# Patient Record
Sex: Male | Born: 1945 | Race: White | Hispanic: No | Smoking: Current every day smoker
Health system: Southern US, Community
[De-identification: ages and names within clinical notes are randomized; demographics above are authoritative.]

## PROBLEM LIST (undated history)

## (undated) DIAGNOSIS — Q211 Atrial septal defect, unspecified: Secondary | ICD-10-CM

## (undated) DIAGNOSIS — Z87442 Personal history of urinary calculi: Secondary | ICD-10-CM

## (undated) DIAGNOSIS — J189 Pneumonia, unspecified organism: Secondary | ICD-10-CM

## (undated) DIAGNOSIS — I499 Cardiac arrhythmia, unspecified: Secondary | ICD-10-CM

## (undated) DIAGNOSIS — J302 Other seasonal allergic rhinitis: Secondary | ICD-10-CM

## (undated) DIAGNOSIS — E0590 Thyrotoxicosis, unspecified without thyrotoxic crisis or storm: Secondary | ICD-10-CM

## (undated) DIAGNOSIS — Z7901 Long term (current) use of anticoagulants: Secondary | ICD-10-CM

## (undated) DIAGNOSIS — R7301 Impaired fasting glucose: Secondary | ICD-10-CM

## (undated) DIAGNOSIS — R6889 Other general symptoms and signs: Secondary | ICD-10-CM

## (undated) DIAGNOSIS — F329 Major depressive disorder, single episode, unspecified: Secondary | ICD-10-CM

## (undated) DIAGNOSIS — Z8489 Family history of other specified conditions: Secondary | ICD-10-CM

## (undated) DIAGNOSIS — M6530 Trigger finger, unspecified finger: Secondary | ICD-10-CM

## (undated) DIAGNOSIS — I4892 Unspecified atrial flutter: Secondary | ICD-10-CM

## (undated) DIAGNOSIS — G4730 Sleep apnea, unspecified: Secondary | ICD-10-CM

## (undated) DIAGNOSIS — J45902 Unspecified asthma with status asthmaticus: Secondary | ICD-10-CM

## (undated) DIAGNOSIS — C801 Malignant (primary) neoplasm, unspecified: Secondary | ICD-10-CM

## (undated) DIAGNOSIS — E7800 Pure hypercholesterolemia, unspecified: Secondary | ICD-10-CM

## (undated) DIAGNOSIS — N529 Male erectile dysfunction, unspecified: Secondary | ICD-10-CM

## (undated) DIAGNOSIS — K760 Fatty (change of) liver, not elsewhere classified: Secondary | ICD-10-CM

## (undated) DIAGNOSIS — M109 Gout, unspecified: Secondary | ICD-10-CM

## (undated) DIAGNOSIS — E785 Hyperlipidemia, unspecified: Secondary | ICD-10-CM

## (undated) DIAGNOSIS — K219 Gastro-esophageal reflux disease without esophagitis: Secondary | ICD-10-CM

## (undated) DIAGNOSIS — I739 Peripheral vascular disease, unspecified: Secondary | ICD-10-CM

## (undated) DIAGNOSIS — IMO0002 Reserved for concepts with insufficient information to code with codable children: Secondary | ICD-10-CM

## (undated) DIAGNOSIS — I4891 Unspecified atrial fibrillation: Secondary | ICD-10-CM

## (undated) DIAGNOSIS — C61 Malignant neoplasm of prostate: Secondary | ICD-10-CM

## (undated) DIAGNOSIS — E291 Testicular hypofunction: Secondary | ICD-10-CM

## (undated) DIAGNOSIS — M1990 Unspecified osteoarthritis, unspecified site: Secondary | ICD-10-CM

## (undated) DIAGNOSIS — I10 Essential (primary) hypertension: Secondary | ICD-10-CM

## (undated) DIAGNOSIS — F32A Depression, unspecified: Secondary | ICD-10-CM

## (undated) HISTORY — DX: Long term (current) use of anticoagulants: Z79.01

## (undated) HISTORY — DX: Major depressive disorder, single episode, unspecified: F32.9

## (undated) HISTORY — DX: Male erectile dysfunction, unspecified: N52.9

## (undated) HISTORY — DX: Hemochromatosis, unspecified: E83.119

## (undated) HISTORY — DX: Trigger finger, unspecified finger: M65.30

## (undated) HISTORY — DX: Unspecified asthma with status asthmaticus: J45.902

## (undated) HISTORY — DX: Pure hypercholesterolemia, unspecified: E78.00

## (undated) HISTORY — DX: Atrial septal defect: Q21.1

## (undated) HISTORY — DX: Depression, unspecified: F32.A

## (undated) HISTORY — DX: Gout, unspecified: M10.9

## (undated) HISTORY — DX: Atrial septal defect, unspecified: Q21.10

## (undated) HISTORY — PX: LIVER BIOPSY: SHX301

## (undated) HISTORY — DX: Testicular hypofunction: E29.1

## (undated) HISTORY — DX: Other general symptoms and signs: R68.89

## (undated) HISTORY — DX: Hyperlipidemia, unspecified: E78.5

## (undated) HISTORY — DX: Other seasonal allergic rhinitis: J30.2

## (undated) HISTORY — DX: Impaired fasting glucose: R73.01

---

## 1995-03-19 DIAGNOSIS — I739 Peripheral vascular disease, unspecified: Secondary | ICD-10-CM

## 1995-03-19 HISTORY — PX: NASAL SINUS SURGERY: SHX719

## 1995-03-19 HISTORY — DX: Peripheral vascular disease, unspecified: I73.9

## 2000-04-08 ENCOUNTER — Encounter: Admission: RE | Admit: 2000-04-08 | Discharge: 2000-04-08 | Payer: Self-pay | Admitting: Internal Medicine

## 2000-04-08 ENCOUNTER — Encounter: Payer: Self-pay | Admitting: Internal Medicine

## 2002-06-21 ENCOUNTER — Encounter (INDEPENDENT_AMBULATORY_CARE_PROVIDER_SITE_OTHER): Payer: Self-pay

## 2002-06-21 ENCOUNTER — Ambulatory Visit (HOSPITAL_COMMUNITY): Admission: RE | Admit: 2002-06-21 | Discharge: 2002-06-21 | Payer: Self-pay | Admitting: Gastroenterology

## 2002-12-24 ENCOUNTER — Encounter: Payer: Self-pay | Admitting: Internal Medicine

## 2002-12-24 ENCOUNTER — Encounter: Admission: RE | Admit: 2002-12-24 | Discharge: 2002-12-24 | Payer: Self-pay | Admitting: Internal Medicine

## 2002-12-31 ENCOUNTER — Ambulatory Visit (HOSPITAL_COMMUNITY): Admission: RE | Admit: 2002-12-31 | Discharge: 2002-12-31 | Payer: Self-pay | Admitting: Internal Medicine

## 2002-12-31 ENCOUNTER — Encounter: Payer: Self-pay | Admitting: Internal Medicine

## 2003-10-05 ENCOUNTER — Ambulatory Visit (HOSPITAL_COMMUNITY): Admission: RE | Admit: 2003-10-05 | Discharge: 2003-10-05 | Payer: Self-pay | Admitting: Gastroenterology

## 2004-04-24 ENCOUNTER — Encounter: Admission: RE | Admit: 2004-04-24 | Discharge: 2004-04-24 | Payer: Self-pay | Admitting: Internal Medicine

## 2004-05-25 ENCOUNTER — Encounter: Admission: RE | Admit: 2004-05-25 | Discharge: 2004-05-25 | Payer: Self-pay | Admitting: Internal Medicine

## 2005-12-09 ENCOUNTER — Encounter: Admission: RE | Admit: 2005-12-09 | Discharge: 2005-12-09 | Payer: Self-pay | Admitting: Internal Medicine

## 2007-02-05 ENCOUNTER — Encounter: Admission: RE | Admit: 2007-02-05 | Discharge: 2007-02-05 | Payer: Self-pay | Admitting: Internal Medicine

## 2007-04-10 ENCOUNTER — Encounter (HOSPITAL_COMMUNITY): Admission: RE | Admit: 2007-04-10 | Discharge: 2007-04-10 | Payer: Self-pay | Admitting: Internal Medicine

## 2007-04-27 ENCOUNTER — Encounter (HOSPITAL_COMMUNITY): Admission: RE | Admit: 2007-04-27 | Discharge: 2007-07-26 | Payer: Self-pay | Admitting: Internal Medicine

## 2007-08-11 ENCOUNTER — Encounter (HOSPITAL_COMMUNITY): Admission: RE | Admit: 2007-08-11 | Discharge: 2007-11-09 | Payer: Self-pay | Admitting: Internal Medicine

## 2007-11-25 ENCOUNTER — Encounter (HOSPITAL_COMMUNITY): Admission: RE | Admit: 2007-11-25 | Discharge: 2008-02-23 | Payer: Self-pay | Admitting: Internal Medicine

## 2009-02-20 ENCOUNTER — Encounter: Admission: RE | Admit: 2009-02-20 | Discharge: 2009-02-20 | Payer: Self-pay | Admitting: Internal Medicine

## 2010-08-03 NOTE — Op Note (Signed)
NAME:  Jonathan Vargas, Jonathan Vargas                        ACCOUNT NO.:  [id number]   MEDICAL RECORD NO.:  [id number]                   PATIENT TYPE:  AMB   LOCATION:  ENDO                                 FACILITY:  [facility]   PHYSICIAN:  Danise Edge, M.D.                DATE OF BIRTH:  1946-01-30   DATE OF PROCEDURE:  10/05/2003  DATE OF DISCHARGE:                                 OPERATIVE REPORT   PROCEDURE:  Esophagogastroduodenoscopy.   PROCEDURE INDICATION:  [name] is a 65 year old male, born  Oct 29, 1945.  [name] has chronic gastroesophageal reflux.  Despite  taking Nexium 40 mg each morning, he developed breakthrough heartburn and  discomfort with swallowing.  Since increasing his Nexium to 40 mg twice a  day, his symptoms are slowly resolving.  He reports no dysphagia.  He does  take a baby aspirin daily.   ENDOSCOPIST:  Charolett Bumpers, M.D.   PREMEDICATION:  1. Versed 5 mg.  2. Demerol 50 mg.   DESCRIPTION OF PROCEDURE:  After obtaining informed consent, [name] was  placed in the left lateral decubitus position. I administered intravenous  Demerol and intravenous Versed to achieve conscious sedation for the  procedure.  The patient's blood pressure, oxygen saturation, and cardiac  rhythm were monitored throughout the procedure and documented in the medical  record.   The Olympus gastroscope was passed through the posterior hypopharynx into  the proximal esophagus without difficulty.  The hypopharynx, larynx, and  vocal cords appeared normal.   ESOPHAGOSCOPY:  The proximal, mid, and lower segments of the esophageal  mucosa appear normal.  The squamocolumnar junction and esophagogastric  junction are noted at 45 cm from the incisor teeth.   GASTROSCOPY:  Retroflexed view of the gastric cardia and fundus was normal.  The gastric body, antrum, and pylorus appear normal.   DUODENOSCOPY:  The duodenal bulb and descending duodenum appear normal.   ASSESSMENT:  Chronic gastroesophageal reflux disease associated with a  normal esophagogastroduodenoscopy.                                               Danise Edge, M.D.    MJ/MEDQ  D:  10/05/2003  T:  10/05/2003  Job:  [id number]   cc:   Theressa Millard, M.D.  [address] [place]  [place] [address]  Fax: [phone]

## 2010-08-03 NOTE — Op Note (Signed)
   NAME:  Jonathan Vargas, Jonathan Vargas                        ACCOUNT NO.:  [id number]   MEDICAL RECORD NO.:  [id number]                   PATIENT TYPE:  AMB   LOCATION:  ENDO                                 FACILITY:  [facility]   PHYSICIAN:  Danise Edge, M.D.                DATE OF BIRTH:  Jun 14, 1945   DATE OF PROCEDURE:  06/21/2002  DATE OF DISCHARGE:                                 OPERATIVE REPORT   PROCEDURE:  Colonoscopy and polypectomy.   INDICATIONS FOR PROCEDURE:  [name] is a 65 year old male born  18-Jul-1945. Jonathan Vargas is scheduled to undergo his first screening  colonoscopy with polypectomy to prevent colon cancer. His father was  diagnosed with colon cancer at age 43.   ENDOSCOPIST:  Charolett Bumpers, M.D.   PREMEDICATION:  Versed 8 mg, Demerol 50 mg .   DESCRIPTION OF PROCEDURE:  After obtaining informed consent, [name] was  placed in the left lateral decubitus position. I administered intravenous  Demerol and intravenous Versed to achieve conscious sedation for the  procedure. The patient's blood pressure, oxygen saturation and cardiac  rhythm were monitored throughout the procedure and documented in the medical  record.   Anal inspection was normal. Digital rectal exam was normal. The prostate is  non-nodular.  The Olympus adult colonoscope was introduced into the rectum  and easily advanced to the cecum.  A normal appearing ileocecal valve was  intubated and the distal ileum inspected.  Colonic preparation for the exam  today was excellent.   RECTUM:  Normal.   SIGMOID COLON AND DESCENDING COLON:  Normal.   SPLENIC FLEXURE:  Normal.   TRANSVERSE COLON:  Normal.   HEPATIC FLEXURE:  Normal.   ASCENDING COLON:  Normal.   CECUM AND ILEOCECAL VALVE:  A 3 mm sessile polyp was removed from the  proximal cecum with the electrocautery snare and submitted for pathological  interpretation.   DISTAL ILEUM:  Normal.    ASSESSMENT:  A 3 mm sessile  polyp was removed from the cecum. Otherwise,  normal proctocolonoscopy to the cecum. Distal ileum was normal.   RECOMMENDATIONS:  Repeat colonoscopy in five years.                                                Danise Edge, M.D.    MJ/MEDQ  D:  06/21/2002  T:  06/21/2002  Job:  [id number]   cc:   Theressa Millard, M.D.  [address] [place]  [place] [address]  Fax: [phone]

## 2010-12-07 LAB — CBC
HCT: 39.3
HCT: 40
Hemoglobin: 13.7
Hemoglobin: 13.8
MCHC: 34.9
MCHC: 34.5
MCV: 96.3
MCV: 97.4
Platelets: 216
Platelets: 227
RBC: 4.09 — ABNORMAL LOW
RBC: 4.11 — ABNORMAL LOW
RDW: 13.4
RDW: 14.2
WBC: 7.7
WBC: 7.3

## 2010-12-10 LAB — CBC
HCT: 39.3
HCT: 39.4
Hemoglobin: 13.6
Hemoglobin: 13.7
MCHC: 34.5
MCHC: 34.9
MCV: 97.5
MCV: 97.8
Platelets: 242
Platelets: 244
RBC: 4.02 — ABNORMAL LOW
RBC: 4.04 — ABNORMAL LOW
RDW: 13.8
RDW: 13.9
WBC: 6.3
WBC: 7.6

## 2010-12-11 LAB — CBC
HCT: 38.6 — ABNORMAL LOW
HCT: 39.4
Hemoglobin: 13.6
Hemoglobin: 13.5
MCHC: 35.2
MCHC: 34.2
MCV: 97.2
MCV: 99.3
Platelets: 234
Platelets: 220
RBC: 3.97 — ABNORMAL LOW
RBC: 3.97 — ABNORMAL LOW
RDW: 13.6
RDW: 13.5
WBC: 6.5
WBC: 6

## 2010-12-12 LAB — CBC
HCT: 40.9
Hemoglobin: 14.3
MCHC: 35
MCV: 99.8
Platelets: 214
RBC: 4.1 — ABNORMAL LOW
RDW: 13.4
WBC: 6.4

## 2010-12-13 LAB — CBC
HCT: 42.6
HCT: 41.7
Hemoglobin: 15.3
Hemoglobin: 14.4
MCHC: 35.9
MCHC: 34.6
MCV: 98.7
MCV: 97.1
Platelets: 237
Platelets: 218
RBC: 4.32
RBC: 4.3
RDW: 13.4
RDW: 13.1
WBC: 7
WBC: 7.5

## 2010-12-14 LAB — CBC
HCT: 39
HCT: 40
Hemoglobin: 13.4
Hemoglobin: 13.7
MCHC: 34.3
MCHC: 34.1
MCV: 98.2
MCV: 98.6
Platelets: 212
Platelets: 231
RBC: 3.97 — ABNORMAL LOW
RBC: 4.06 — ABNORMAL LOW
RDW: 13.2
RDW: 13.3
WBC: 5
WBC: 5.8

## 2010-12-17 LAB — CBC
HCT: 38.9 — ABNORMAL LOW
HCT: 36 — ABNORMAL LOW
HCT: 36.5 — ABNORMAL LOW
Hemoglobin: 13.2
Hemoglobin: 12 — ABNORMAL LOW
Hemoglobin: 12.3 — ABNORMAL LOW
MCHC: 33.8
MCHC: 33.3
MCHC: 33.7
MCV: 95.1
MCV: 90.4
MCV: 92.9
Platelets: 239
Platelets: 259
Platelets: 285
RBC: 4.09 — ABNORMAL LOW
RBC: 3.93 — ABNORMAL LOW
RBC: 3.98 — ABNORMAL LOW
RDW: 13.2
RDW: 13.2
RDW: 13.3
WBC: 6.8
WBC: 5.8
WBC: 6.1

## 2010-12-19 LAB — CBC
HCT: 37.9 — ABNORMAL LOW
Hemoglobin: 13
MCHC: 34.4
MCV: 96.3
Platelets: 263
RBC: 3.94 — ABNORMAL LOW
RDW: 13.2
WBC: 6.2

## 2011-01-25 ENCOUNTER — Other Ambulatory Visit: Payer: Self-pay

## 2011-01-25 ENCOUNTER — Observation Stay (HOSPITAL_COMMUNITY)
Admission: EM | Admit: 2011-01-25 | Discharge: 2011-01-25 | Disposition: A | Discharge status: Home / Self Care | Payer: Medicare Other | Attending: Cardiology | Admitting: Cardiology

## 2011-01-25 ENCOUNTER — Encounter: Payer: Self-pay | Admitting: Emergency Medicine

## 2011-01-25 ENCOUNTER — Emergency Department (HOSPITAL_COMMUNITY): Payer: Medicare Other

## 2011-01-25 DIAGNOSIS — I4892 Unspecified atrial flutter: Principal | ICD-10-CM | POA: Diagnosis present

## 2011-01-25 DIAGNOSIS — G4730 Sleep apnea, unspecified: Secondary | ICD-10-CM | POA: Insufficient documentation

## 2011-01-25 DIAGNOSIS — E785 Hyperlipidemia, unspecified: Secondary | ICD-10-CM | POA: Insufficient documentation

## 2011-01-25 DIAGNOSIS — I4891 Unspecified atrial fibrillation: Secondary | ICD-10-CM | POA: Insufficient documentation

## 2011-01-25 DIAGNOSIS — Z7901 Long term (current) use of anticoagulants: Secondary | ICD-10-CM | POA: Insufficient documentation

## 2011-01-25 DIAGNOSIS — E7800 Pure hypercholesterolemia, unspecified: Secondary | ICD-10-CM

## 2011-01-25 DIAGNOSIS — I10 Essential (primary) hypertension: Secondary | ICD-10-CM | POA: Diagnosis present

## 2011-01-25 DIAGNOSIS — R079 Chest pain, unspecified: Secondary | ICD-10-CM | POA: Insufficient documentation

## 2011-01-25 HISTORY — DX: Peripheral vascular disease, unspecified: I73.9

## 2011-01-25 HISTORY — DX: Unspecified atrial fibrillation: I48.91

## 2011-01-25 HISTORY — DX: Sleep apnea, unspecified: G47.30

## 2011-01-25 HISTORY — DX: Reserved for concepts with insufficient information to code with codable children: IMO0002

## 2011-01-25 HISTORY — DX: Family history of other specified conditions: Z84.89

## 2011-01-25 HISTORY — DX: Unspecified atrial flutter: I48.92

## 2011-01-25 HISTORY — DX: Essential (primary) hypertension: I10

## 2011-01-25 HISTORY — DX: Gastro-esophageal reflux disease without esophagitis: K21.9

## 2011-01-25 LAB — CBC
Hemoglobin: 14.4 g/dL (ref 13.0–17.0)
MCH: 30.3 pg (ref 26.0–34.0)
RBC: 4.75 MIL/uL (ref 4.22–5.81)

## 2011-01-25 LAB — GLUCOSE, CAPILLARY: Glucose-Capillary: 117 mg/dL — ABNORMAL HIGH (ref 70–99)

## 2011-01-25 LAB — BASIC METABOLIC PANEL
BUN: 23 mg/dL (ref 6–23)
CO2: 27 mEq/L (ref 19–32)
Chloride: 106 mEq/L (ref 96–112)
Glucose, Bld: 123 mg/dL — ABNORMAL HIGH (ref 70–99)
Potassium: 4.5 mEq/L (ref 3.5–5.1)

## 2011-01-25 LAB — URINALYSIS, ROUTINE W REFLEX MICROSCOPIC
Ketones, ur: NEGATIVE mg/dL
Leukocytes, UA: NEGATIVE
Nitrite: NEGATIVE
Protein, ur: NEGATIVE mg/dL

## 2011-01-25 LAB — CARDIAC PANEL(CRET KIN+CKTOT+MB+TROPI)
CK, MB: 4 ng/mL (ref 0.3–4.0)
Relative Index: 3.7 — ABNORMAL HIGH (ref 0.0–2.5)
Total CK: 108 U/L (ref 7–232)

## 2011-01-25 LAB — DIFFERENTIAL
Lymphs Abs: 1.4 10*3/uL (ref 0.7–4.0)
Monocytes Relative: 11 % (ref 3–12)
Neutro Abs: 2.6 10*3/uL (ref 1.7–7.7)
Neutrophils Relative %: 54 % (ref 43–77)

## 2011-01-25 MED ORDER — DABIGATRAN ETEXILATE MESYLATE 150 MG PO CAPS: 150.0000 mg | ORAL_CAPSULE | Freq: Two times a day (BID) | ORAL | Status: DC

## 2011-01-25 MED ORDER — ACETAMINOPHEN 325 MG PO TABS: 650.0000 mg | ORAL_TABLET | ORAL | Status: DC | PRN

## 2011-01-25 MED ORDER — ALBUTEROL SULFATE HFA 108 (90 BASE) MCG/ACT IN AERS
2.0000 | INHALATION_SPRAY | Freq: Four times a day (QID) | RESPIRATORY_TRACT | Status: DC | PRN
  Filled 2011-01-25: qty 6.7

## 2011-01-25 MED ORDER — FLUTICASONE PROPIONATE 50 MCG/ACT NA SUSP
2.0000 | Freq: Every day | NASAL | Status: DC
  Filled 2011-01-25: qty 16

## 2011-01-25 MED ORDER — SODIUM CHLORIDE 0.9 % IJ SOLN: 3.0000 mL | INTRAMUSCULAR | Status: DC | PRN

## 2011-01-25 MED ORDER — PANTOPRAZOLE SODIUM 40 MG PO TBEC
40.0000 mg | DELAYED_RELEASE_TABLET | Freq: Every day | ORAL | Status: DC
  Administered 2011-01-25: 40 mg via ORAL
  Filled 2011-01-25: qty 1

## 2011-01-25 MED ORDER — DILTIAZEM HCL 50 MG/10ML IV SOLN
10.0000 mg | Freq: Once | INTRAVENOUS | Status: AC
  Administered 2011-01-25: 10 mg via INTRAVENOUS

## 2011-01-25 MED ORDER — NITROGLYCERIN 0.4 MG SL SUBL: 0.4000 mg | SUBLINGUAL_TABLET | SUBLINGUAL | Status: DC | PRN

## 2011-01-25 MED ORDER — SODIUM CHLORIDE 0.9 % IJ SOLN: 3.0000 mL | Freq: Two times a day (BID) | INTRAMUSCULAR | Status: DC

## 2011-01-25 MED ORDER — DILTIAZEM LOAD VIA INFUSION
10.0000 mg | INTRAVENOUS | Status: DC
  Filled 2011-01-25: qty 10

## 2011-01-25 MED ORDER — DILTIAZEM HCL 100 MG IV SOLR
5.0000 mg/h | Freq: Once | INTRAVENOUS | Status: AC
  Administered 2011-01-25: 5 mg/h via INTRAVENOUS

## 2011-01-25 MED ORDER — DILTIAZEM HCL 100 MG IV SOLR
INTRAVENOUS | Status: AC
  Filled 2011-01-25: qty 100

## 2011-01-25 MED ORDER — ASPIRIN 325 MG PO TABS
325.0000 mg | ORAL_TABLET | Freq: Every day | ORAL | Status: DC
  Filled 2011-01-25: qty 1

## 2011-01-25 MED ORDER — DABIGATRAN ETEXILATE MESYLATE 150 MG PO CAPS
150.0000 mg | ORAL_CAPSULE | Freq: Two times a day (BID) | ORAL | Status: DC
  Administered 2011-01-25: 150 mg via ORAL
  Filled 2011-01-25 (×2): qty 1

## 2011-01-25 MED ORDER — ONDANSETRON HCL 4 MG/2ML IJ SOLN: 4.0000 mg | Freq: Four times a day (QID) | INTRAMUSCULAR | Status: DC | PRN

## 2011-01-25 MED ORDER — OLMESARTAN MEDOXOMIL 20 MG PO TABS
20.0000 mg | ORAL_TABLET | Freq: Every day | ORAL | Status: DC
  Filled 2011-01-25 (×2): qty 1

## 2011-01-25 MED ORDER — DILTIAZEM HCL 100 MG IV SOLR
5.0000 mg/h | INTRAVENOUS | Status: DC
  Filled 2011-01-25: qty 100

## 2011-01-25 MED ORDER — DILTIAZEM HCL 50 MG/10ML IV SOLN
10.0000 mg | Freq: Once | INTRAVENOUS | Status: DC
  Administered 2011-01-25: 10 mg via INTRAVENOUS

## 2011-01-25 MED ORDER — DILTIAZEM HCL ER COATED BEADS 120 MG PO CP24: 120.0000 mg | ORAL_CAPSULE | Freq: Every day | ORAL | Status: DC

## 2011-01-25 NOTE — ED Notes (Signed)
Med requested from pharmacy pradaxia

## 2011-01-25 NOTE — ED Notes (Signed)
Pt placed on monitor upon arrival to er o2 at 2 lnc pulse ox

## 2011-01-25 NOTE — ED Provider Notes (Signed)
History     CSN: [id number] Arrival date & time: 01/25/2011  7:48 AM   First MD Initiated Contact with Patient 01/25/11 0740      Chief Complaint  Patient presents with  . Chest Pain    (Consider location/radiation/quality/duration/timing/severity/associated sxs/prior treatment) HPI Comments: Patient has history of atrial fibrillation and has been off of meds since February presenting with palpitations that began around 2 AM and he will to the bathroom. He was able to go back to sleep up and around 6:30 he felt a racing heart with some chest pressure and called EMS. His was to have atrial flutter at a rate of 154 and given 5 mg of Lopressor. Currently he still feels palpitations and racing heart but has not have any shortness of breath, nausea, vomiting. His chest pressure has resolved. He denies any headache, diaphoresis, weakness, numbness or tingling. He is not anticoagulated. His cardiologist is Dr. Anne Fu.  The history is provided by the patient and the EMS personnel.    Past Medical History  Diagnosis Date  . Atrial fib/flutter, transient   . Acid reflux   . Hypertension   . FHx: allergies     History reviewed. No pertinent past surgical history.  No family history on file.  History  Substance Use Topics  . Smoking status: Current Everyday Smoker  . Smokeless tobacco: Not on file  . Alcohol Use: Yes      Review of Systems  Constitutional: Positive for fatigue. Negative for fever, activity change and appetite change.  HENT: Negative for congestion and rhinorrhea.   Respiratory: Negative for cough and shortness of breath.   Cardiovascular: Positive for chest pain and palpitations.  Gastrointestinal: Negative for nausea, vomiting and abdominal pain.  Genitourinary: Negative for dysuria and hematuria.  Musculoskeletal: Negative for back pain.  Skin: Negative for rash.  Neurological: Negative for dizziness and headaches.    Allergies  Amoxicillin  Home  Medications   Current Outpatient Rx  Name Route Sig Dispense Refill  . ALBUTEROL SULFATE HFA 108 (90 BASE) MCG/ACT IN AERS Inhalation Inhale 2 puffs into the lungs every 6 (six) hours as needed. For shortness of breath or wheeze      . LIPOIC ACID PO Oral Take by mouth. Hold while in hospital     . ASPIRIN 325 MG PO TABS Oral Take 325 mg by mouth daily.      . OCUVITE PO TABS Oral Take 1 tablet by mouth 2 (two) times daily.      Marland Kitchen VITAMIN D 1000 UNITS PO TABS Oral Take 2,000 Units by mouth 2 (two) times daily.      Marland Kitchen FEXOFENADINE HCL 60 MG PO TABS Oral Take 60 mg by mouth daily as needed. For allergies     . THERA M PLUS PO TABS Oral Take 1 tablet by mouth daily.      . OMEGA-3-ACID ETHYL ESTERS 1 G PO CAPS Oral Take 2 g by mouth 2 (two) times daily.      Marland Kitchen VITAMIN E 200 UNITS PO CAPS Oral Take 400 Units by mouth daily.      Marland Kitchen NEXIUM PO Oral Take 40 mg by mouth daily.     Marland Kitchen FLUTICASONE PROPIONATE 50 MCG/ACT NA SUSP Nasal Place 2 sprays into the nose daily.      Evlyn Kanner PO Oral Take 150 mg by mouth daily.     Marland Kitchen ZANTAC 75 PO Oral Take 300 mg by mouth.       BP  127/86  Pulse 139  Temp(Src) 98 F (36.7 C) (Oral)  Resp 16  SpO2 99%  Physical Exam  Constitutional: He is oriented to person, place, and time. He appears well-developed and well-nourished. No distress.  HENT:  Head: Normocephalic and atraumatic.  Mouth/Throat: Oropharynx is clear and moist. No oropharyngeal exudate.  Eyes: Conjunctivae are normal. Pupils are equal, round, and reactive to light.  Neck: Normal range of motion.  Cardiovascular: Normal rate, regular rhythm and normal heart sounds.        Regular tachycardia +2 radial, femoral, DP, PT pulses  Pulmonary/Chest: No respiratory distress.  Abdominal: Soft. There is no tenderness. There is no rebound and no guarding.  Musculoskeletal: Normal range of motion. He exhibits no edema and no tenderness.  Neurological: He is alert and oriented to person, place, and time.  No cranial nerve deficit.  Skin: Skin is warm.    ED Course  Procedures (including critical care time)  Labs Reviewed  BASIC METABOLIC PANEL - Abnormal; Notable for the following:    Glucose, Bld 123 (*)    GFR calc non Af Amer 65 (*)    GFR calc Af Amer 76 (*)    All other components within normal limits  CARDIAC PANEL(CRET KIN+CKTOT+MB+TROPI) - Abnormal; Notable for the following:    Relative Index 3.7 (*)    All other components within normal limits  CBC  DIFFERENTIAL  PROTIME-INR  URINALYSIS, ROUTINE W REFLEX MICROSCOPIC   Dg Chest Portable 1 View  01/25/2011  *RADIOLOGY REPORT*  Clinical Data: Chest pain.  PORTABLE CHEST - 1 VIEW  Comparison: None.  Findings: Tortuous aorta.  Cardiomegaly.  Central pulmonary vascular prominence.  No segmental consolidation or gross pneumothorax.  The patient would eventually benefit from follow-up two-view chest with cardiac leads removed.  IMPRESSION: Tortuous aorta.  Cardiomegaly.  Central pulmonary vascular prominence.  Original Report Authenticated By: Fuller Canada, M.D.     No diagnosis found.    MDM  Palpitations and chest pressure with history of atrial fibrillation.  Appears to be a flutter at this time as is a regular rate and narrow complex tachycardia.  Patient is a hemodynamically stable, does not have any chest pain or shortness of breath.   Date: 01/25/2011  Rate: 138  Rhythm: atrial flutter  QRS Axis: normal  Intervals: normal  ST/T Wave abnormalities: nonspecific ST/T changes  Conduction Disutrbances:right bundle branch block  Narrative Interpretation:   Old EKG Reviewed: none available  Heart rate remains in the 130s to 140s despite Cardizem. Labs reviewed, patient remained stable and asymptomatic. Case discussed with Dr. Anne Fu who will come and evaluate the patient. Recommends to continue cardizem and give lovenox.   CRITICAL CARE Performed by: Glynn Octave   Total critical care time: 30  Critical  care time was exclusive of separately billable procedures and treating other patients.  Critical care was necessary to treat or prevent imminent or life-threatening deterioration.  Critical care was time spent personally by me on the following activities: development of treatment plan with patient and/or surrogate as well as nursing, discussions with consultants, evaluation of patient's response to treatment, examination of patient, obtaining history from patient or surrogate, ordering and performing treatments and interventions, ordering and review of laboratory studies, ordering and review of radiographic studies, pulse oximetry and re-evaluation of patient's condition.      Glynn Octave, MD 01/25/11 1807

## 2011-01-25 NOTE — ED Notes (Signed)
Pt given 2nd bolus of 10 mg and  Med rate increased to 10 cc

## 2011-01-25 NOTE — ED Notes (Signed)
Patient denies pain and is resting comfortably.  

## 2011-01-25 NOTE — H&P (Signed)
Admit date: 01/25/2011 Referring Physician  Dr. Manus Gunning Primary Physician Dr. Theressa Millard Primary Cardiologist  Dr. Harmon Dun  Reason for Consultation  Evaluation of atrial fib/flutter parox with associated chest pain.   HPI: 65 year old with history of atrial fibrillation, first diagnosed 10/11, nuclear stress test low risk on April 2011, hypertension, hyperlipidemia, hemachromatosis, sleep apnea-currently off CPAP due to weight loss here in the [facility] emergency department for the sensation of palpitations, rapid heart rate, chest pain. Woke up this morning at 2am and felt fine. As soon as he layed down, heart was "rattling". Stayed up for about 2 hours. Had a burning sensation down both arms and chest mild. He has had this before. 4am went back to sleep. 630am sat up, did not feel heart rattling but pulse was "flying". Called 911.   His diltiazem 120 mg once a day was discontinued in winter of 2011 after no further recurrences of atrial fibrillation were found. Avapro was restarted for hypertension.   Here in the emergency department, he was given diltiazem IV bolus 10 mg x2 and started on a diltiazem drip. In the past he has responded to diltiazem well. He denies any strokelike symptoms, nondiabetic. 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 Feel OK now. No more burning or discomfort. Most of the pressure is gone.   Working out with trainer last Tuesday. Walks on treadmill at 3 mph. Noted some heaviness, and pain down both arms.   PMH:   Past Medical History  Diagnosis Date  . Atrial fib/flutter, transient   . Acid reflux   . Hypertension   . FHx: allergies   . Sleep apnea     After weight loss, off of CPAP  . 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    PSH:  History reviewed. No pertinent past surgical history. Allergies:  Amoxicillin Prior to  Admit Meds:  ZyrTEC 10 MG Tablet Chewable 1 tab prn Once a day   MVI as directed    Flonase 50 MCG/ACT Suspension 1 puff in each nostril Once a day   Allegra 60 MG Tablet 1 tab prn once daily   Ranitidine HCl 300 MG Capsule 1 capsule at bedtime Once a day   Albuterol 90 MCG/ACT Aerosol Solution 2 puffs as needed   Aspirin 325 mg Tablet Chewable 1 tablet Once a day   Avapro 150 MG Tablet 1 tablet Once a day   Nexium 40 mg Capsule Delayed Release 1 cap qam   Testosterone bioidentical compounded Cream 10% as directed daily   OTC vitamin d,vitamin e,probiotic,lutein,alpha lipic acid as directed    Fish Oil 1200 MG Capsule 1 capsule twice a day       Fam HX:   History reviewed. No pertinent family history. Social HX:    History   Social History  . Marital Status: Divorced    Spouse Name: N/A    Number of Children: N/A  . Years of Education: N/A   Occupational History  . Not on file.   Social History Main Topics  . Smoking status: Current Everyday Smoker  . Smokeless tobacco: Not on file  . Alcohol Use: Yes  . Drug Use:   . Sexually Active:    Other Topics Concern  . Not on file   Social History Narrative  . No narrative on file     ROS: No fevers, occasional cough, no  GI probs other than GERD. No syncope. Intermittent dizziness.  All 11 ROS were addressed and are negative except what is stated in the HPI  Physical Exam: Blood pressure 127/86, pulse 139, temperature 98 F (36.7 C), temperature source Oral, resp. rate 16, SpO2 99.00%.   General: Well developed, well nourished, in no acute distress Head: Eyes PERRLA, No xanthomas.   Normal cephalic and atramatic  Lungs:   Clear bilaterally to auscultation and percussion. Normal respiratory effort. No wheezes, no rales. Heart:  Regular with rapid heart rate Pulses are 2+ & equal.            No carotid bruit. No JVD.  No abdominal bruits. No femoral bruits. Abdomen: Bowel sounds are positive, abdomen soft and  non-tender without masses or                  Hernia's noted. No hepatosplenomegaly. Msk:  Back normal, normal gait. Normal strength and tone for age. Extremities:   No clubbing, cyanosis or edema.  DP +1 Neuro: Alert and oriented X 3, non-focal, MAE x 4 GU: Deferred Rectal: Deferred Psych:  Good affect, responds appropriately    Labs:   Lab Results  Component Value Date   WBC 4.7 01/25/2011   HGB 14.4 01/25/2011   HCT 44.1 01/25/2011   MCV 92.8 01/25/2011   PLT 212 01/25/2011     Lab 01/25/11 0805  NA 141  K 4.5  CL 106  CO2 27  BUN 23  CREATININE 1.14  CALCIUM 9.0  PROT --  BILITOT --  ALKPHOS --  ALT --  AST --  GLUCOSE 123*   No results found for this basename: PTT   Lab Results  Component Value Date   INR 1.02 01/25/2011   Lab Results  Component Value Date   CKTOTAL 108 01/25/2011   CKMB 4.0 01/25/2011   TROPONINI <0.30 01/25/2011       Radiology:  Dg Chest Portable 1 View  01/25/2011  *RADIOLOGY REPORT*  Clinical Data: Chest pain.  PORTABLE CHEST - 1 VIEW  Comparison: None.  Findings: Tortuous aorta.  Cardiomegaly.  Central pulmonary vascular prominence.  No segmental consolidation or gross pneumothorax.  The patient would eventually benefit from follow-up two-view chest with cardiac leads removed.  IMPRESSION: Tortuous aorta.  Cardiomegaly.  Central pulmonary vascular prominence.  Original Report Authenticated By: Fuller Canada, M.D.   X-ray personally viewed EKG:  Atrial flutter with rapid ventricular response heart rate in the 140s. EKG personally reviewed.  ASSESSMENT:  1. atrial flutter/fibrillation, paroxysmal 2. prior sleep apnea 3. Hypertension 4. chest pain 5. hyperlipidemia   PLAN:   Bring in for observation. Goal is to provide conversion to sinus rhythm. Hopefully he will auto convert with increasing diltiazem. Start Lovenox per A. fib protocol until Pradaxa is therapeutic. Start Pradaxa 150mg  BID. Dilt IV. If no conversion then  cardioversion. Last stress test in April of 2011 reassuring with no evidence of ischemia. If he has ongoing chest discomfort, may consider cardiac catheterization in future. Continue to cycle cardiac markers. First set is reassuring and negative.  Donato Schultz, MD  01/25/2011  10:11 AM

## 2011-01-25 NOTE — Discharge Summary (Signed)
Patient ID: Jonathan Vargas MRN: [id number] DOB/AGE: 12-15-1945 65 y.o.  Admit date: 01/25/2011 Discharge date: 01/25/2011  Primary Discharge Diagnosis atrial flutter is the primary diagnosis Secondary Discharge Diagnosis chronic anticoagulation, hypertension.   Hospital Course: 65 her old male with a history of paroxysmal atrial flutter who was admitted via the emergency department with atrial flutter rapid ventricular response. Diltiazem IV was administered. 2 separate injections of IV 10 mg were administered. He was then placed on a drip which was titrated up to 10 mg per hour. After approximately 4 hours, he converted back to sinus rhythm/sinus bradycardia. He did however have a postconversion pulse of 8.2 seconds which was symptomatic, he felt flushed. This was in part iatrogenic from diltiazem.   I discussed the case with Dr. Hillis Range and his electrophysiology team. The key for him is to stay out of atrial flutter in order to ward off any further postconversion pauses. What we will do is have him set up for a clinic visit with Dr. Johney Frame to discuss atrial flutter ablation. In lead 3 of the EKG the P wave did appear to be inverted. The P wave appears to be upright in lead 1. He is still challenging however to determine whether or not this is a typical flutter.  After his postconversion pause he feels well, and relating well, no chest discomfort. He is doing well. Physical exam is normal. We have decided to place him back on diltiazem CD 120 mg once a day. I have also placed him on Pradaxa 150 mg twice a day for anticoagulation.  I have also instructed him not to drive for one week because of this postconversion pause. Based on his history, I do believe that over the past few weeks he has had brief episodes of atrial flutter with postconversion pauses. He states that he had a similar flushed feeling previously, even one while driving. I explained to him the danger of driving in the situation.  Ultimately, he may need to have a pacemaker placed if antiarrhythmic therapy is not successful.   Discharge Exam: Blood pressure 104/71, pulse 81, temperature 97.3 F (36.3 C), temperature source Oral, resp. rate 14, height 6\' 2"  (1.88 m), weight 106.142 kg (234 lb), SpO2 94.00%.   General appearance: alert, cooperative and no distress Cardio: regular rate and rhythm, S1, S2 normal, no murmur, click, rub or gallop Extremities: extremities normal, atraumatic, no cyanosis or edema Labs:   Lab Results  Component Value Date   WBC 4.7 01/25/2011   HGB 14.4 01/25/2011   HCT 44.1 01/25/2011   MCV 92.8 01/25/2011   PLT 212 01/25/2011    Lab 01/25/11 0805  NA 141  K 4.5  CL 106  CO2 27  BUN 23  CREATININE 1.14  CALCIUM 9.0  PROT --  BILITOT --  ALKPHOS --  ALT --  AST --  GLUCOSE 123*   Lab Results  Component Value Date   CKTOTAL 108 01/25/2011   CKMB 4.0 01/25/2011   TROPONINI <0.30 01/25/2011    EKG: Repeat EKG demonstrates sinus bradycardia with no other abnormalities. Telemetry personally viewed.  FOLLOW UP PLANS AND APPOINTMENTS Discharge Orders    Future Orders Please Complete By Expires   Diet - low sodium heart healthy      Increase activity slowly      Discharge instructions      Driving Restrictions      Comments:   1 week     Current Discharge Medication List  START taking these medications   Details  dabigatran (PRADAXA) 150 MG CAPS Take 1 capsule (150 mg total) by mouth every 12 (twelve) hours. Qty: 60 capsule, Refills: 12    diltiazem (CARDIZEM CD) 120 MG 24 hr capsule Take 1 capsule (120 mg total) by mouth daily. Qty: 30 capsule, Refills: 12      CONTINUE these medications which have NOT CHANGED   Details  albuterol (PROVENTIL HFA;VENTOLIN HFA) 108 (90 BASE) MCG/ACT inhaler Inhale 2 puffs into the lungs every 6 (six) hours as needed. For shortness of breath or wheeze      Alpha-Lipoic Acid (LIPOIC ACID PO) Take by mouth. Hold while in hospital       !! beta carotene w/minerals (OCUVITE) tablet Take 1 tablet by mouth 2 (two) times daily.      cholecalciferol (VITAMIN D) 1000 UNITS tablet Take 2,000 Units by mouth 2 (two) times daily.      Esomeprazole Magnesium (NEXIUM PO) Take 40 mg by mouth daily.     fexofenadine (ALLEGRA) 60 MG tablet Take 60 mg by mouth daily as needed. For allergies     fluticasone (FLONASE) 50 MCG/ACT nasal spray Place 2 sprays into the nose daily.      Irbesartan (AVAPRO PO) Take 150 mg by mouth daily.     !! Multiple Vitamins-Minerals (MULTIVITAMINS THER. W/MINERALS) TABS Take 1 tablet by mouth daily.      omega-3 acid ethyl esters (LOVAZA) 1 G capsule Take 2 g by mouth 2 (two) times daily.      Ranitidine HCl (ZANTAC 75 PO) Take 300 mg by mouth.     vitamin E 200 UNIT capsule Take 400 Units by mouth daily.       !! - Potential duplicate medications found. Please discuss with provider.    STOP taking these medications     aspirin 325 MG tablet        Follow-up Information    Follow up with Hillis Range, MD. (They will call with appt. )    Contact information:   [address] [place] [address] [phone]          BRING ALL MEDICATIONS WITH YOU TO FOLLOW UP APPOINTMENTS  Time spent with patient to include physician time: 35 minutes in total spent with discussion other physician, med reconciliation, review of medical records. SignedDonato Schultz 01/25/2011, 3:27 PM

## 2011-01-25 NOTE — ED Notes (Signed)
About 2 am  Started to feel  Palpitations and then  pressure in chest  At 0640  Per ems heart rate was 154 afib given lopressor 5mg  iv has IV left wrist o2 at 2 lnc and he had taken 325 asa already, off his afib meds x 8 months

## 2011-01-30 ENCOUNTER — Ambulatory Visit (INDEPENDENT_AMBULATORY_CARE_PROVIDER_SITE_OTHER): Payer: Medicare Other | Admitting: Internal Medicine

## 2011-01-30 ENCOUNTER — Encounter: Payer: Self-pay | Admitting: Internal Medicine

## 2011-01-30 ENCOUNTER — Telehealth: Payer: Self-pay | Admitting: Internal Medicine

## 2011-01-30 VITALS — BP 110/78 | HR 58 | Ht 74.25 in | Wt 232.0 lb

## 2011-01-30 DIAGNOSIS — I498 Other specified cardiac arrhythmias: Secondary | ICD-10-CM

## 2011-01-30 DIAGNOSIS — I480 Paroxysmal atrial fibrillation: Secondary | ICD-10-CM | POA: Insufficient documentation

## 2011-01-30 DIAGNOSIS — R001 Bradycardia, unspecified: Secondary | ICD-10-CM | POA: Insufficient documentation

## 2011-01-30 DIAGNOSIS — I4892 Unspecified atrial flutter: Secondary | ICD-10-CM

## 2011-01-30 DIAGNOSIS — G4730 Sleep apnea, unspecified: Secondary | ICD-10-CM | POA: Insufficient documentation

## 2011-01-30 DIAGNOSIS — I4891 Unspecified atrial fibrillation: Secondary | ICD-10-CM

## 2011-01-30 MED ORDER — FLECAINIDE ACETATE 50 MG PO TABS: 50.0000 mg | ORAL_TABLET | Freq: Two times a day (BID) | ORAL | Status: DC

## 2011-01-30 NOTE — Patient Instructions (Addendum)
Your physician has requested that you have an exercise tolerance test. For further information please visit [email or web address] Please also follow instruction sheet, as given.---Dr Anne Fu office to do test  Dr Johney Frame spoke with Dr Anne Fu  Your physician has recommended that you have a sleep study. This test records several body functions during sleep, including: brain activity, eye movement, oxygen and carbon dioxide blood levels, heart rate and rhythm, breathing rate and rhythm, the flow of air through your mouth and nose, snoring, body muscle movements, and chest and belly movement.---Dr Anne Fu to order test Dr Johney Frame spoke with Dr Anne Fu   Your physician has recommended you make the following change in your medication: 1)Flecainide 50mg  one twice

## 2011-01-30 NOTE — Telephone Encounter (Signed)
Spoke with patient ans answered questions regarding medication

## 2011-01-30 NOTE — Assessment & Plan Note (Signed)
Give afib and sleep apnea, he should have a repeat sleep study by Dr Anne Fu office to see if he needs CPAP at this time.

## 2011-01-30 NOTE — Progress Notes (Signed)
Primary Care Physician: Jonathan Vargas Referring Physician:  Dr [name] is a pleasant 65 y.o. patient with a h/o HTN, OSA, and atrial fibrillation/ atrial flutter who presents today for EP consultation.  He reports initially being diagnosed with atrial fibrillation 10/11.  He was in [place] at the time on vacation.  He woke with shoulder pain.  He was evaluated by EMS and found to have afib with RVR.  He went to [facility] where he was diagnosed with atrial fibrillation.  He received IV cardizem and converted to sinus rhythm.  He was observed and discharged.  He was placed on diltiazem and ASA.  He did well, without recurrence until last week. He reports that this past Friday morning, he woke with tachypalpitations.  He reports associated fatigue and decreased exercise tolerance.  He presented to [facility] and was found to have atrial flutter.  He was placed on a cardizem drip.  He converted to sinus rhythm but did have a post termination pause of over 5 seconds.  He reports associated presyncope but did not lose conciousness.  He was discharged on cardizem.  He has done well since that time. Today, he denies symptoms of further palpitations, chest pain, shortness of breath, orthopnea, PND, lower extremity edema, dizziness, presyncope, syncope, or neurologic sequela. The patient is tolerating medications without difficulties and is otherwise without complaint today.   Past Medical History  Diagnosis Date  . Atrial fib/flutter, transient   . Acid reflux   . Hypertension   . FHx: allergies   . Sleep apnea     After weight loss, off of CPAP  . 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  . Seasonal allergies    Past Surgical History  Procedure Date  . Nasal sinus surgery 1997    Current Outpatient Prescriptions  Medication Sig Dispense Refill  . albuterol (PROVENTIL HFA;VENTOLIN HFA)  108 (90 BASE) MCG/ACT inhaler Inhale 2 puffs into the lungs every 6 (six) hours as needed. For shortness of breath or wheeze        . Alpha-Lipoic Acid (LIPOIC ACID PO) Take by mouth. Hold while in hospital       . cholecalciferol (VITAMIN D) 1000 UNITS tablet Take 2,000 Units by mouth 2 (two) times daily.        . dabigatran (PRADAXA) 150 MG CAPS Take 1 capsule (150 mg total) by mouth every 12 (twelve) hours.  60 capsule  12  . diltiazem (CARDIZEM CD) 120 MG 24 hr capsule Take 1 capsule (120 mg total) by mouth daily.  30 capsule  12  . Esomeprazole Magnesium (NEXIUM PO) Take 40 mg by mouth daily.       . fexofenadine (ALLEGRA) 60 MG tablet Take 60 mg by mouth daily as needed. For allergies       . fluticasone (FLONASE) 50 MCG/ACT nasal spray Place 2 sprays into the nose daily.        . Irbesartan (AVAPRO PO) Take 150 mg by mouth daily.       . LUTEIN-ZEAXANTHIN PO Take 1 tablet by mouth daily.        . Multiple Vitamins-Minerals (MULTIVITAMINS THER. W/MINERALS) TABS Take 1 tablet by mouth daily.        Marland Kitchen omega-3 acid ethyl esters (LOVAZA) 1 G capsule Take 2 g by mouth daily.       . Ranitidine HCl (ZANTAC 75 PO) Take 300  mg by mouth.       . vitamin E 200 UNIT capsule Take 400 Units by mouth daily.          Allergies  Allergen Reactions  . Amoxicillin Rash    History   Social History  . Marital Status: Divorced    Spouse Name: N/A    Number of Children: N/A  . Years of Education: N/A   Occupational History  . Not on file.   Social History Main Topics  . Smoking status: Current Everyday Smoker  . Smokeless tobacco: Not on file   Comment: trying to quit  . Alcohol Use: Yes     2-3 glasses of red wine per night  . Drug Use: No  . Sexually Active: Not on file   Other Topics Concern  . Not on file   Social History Narrative   Pt lives in [place] alone.  Retired from [employer].    Family History  Problem Relation Age of Onset  . Stroke Father      stroke  . Heart disease Mother     with pacemaker    ROS- All systems are reviewed and negative except as per the HPI above  Physical Exam: Filed Vitals:   01/30/11 0908  BP: 110/78  Pulse: 58  Height: 6' 2.25" (1.886 m)  Weight: 232 lb (105.235 kg)    GEN- The patient is well appearing, alert and oriented x 3 today.   Head- normocephalic, atraumatic Eyes-  Sclera clear, conjunctiva pink Ears- hearing intact Oropharynx- clear Neck- supple, no JVP Lymph- no cervical lymphadenopathy Lungs- Clear to ausculation bilaterally, normal work of breathing Heart- Regular rate and rhythm, no murmurs, rubs or gallops, PMI not laterally displaced GI- soft, NT, ND, + BS Extremities- no clubbing, cyanosis, or edema MS- no significant deformity or atrophy Skin- no rash or lesion Psych- euthymic mood, full affect Neuro- strength and sensation are intact  EKG today reveals sinus rhythm 58 bpm,  PR 128, QRS 88, QTc 394,otherwise normal ekg  Assessment and Plan:

## 2011-01-30 NOTE — Assessment & Plan Note (Signed)
As above.

## 2011-01-30 NOTE — Assessment & Plan Note (Signed)
He had a post termination pause while on cardizem drip with presyncope.  No further episodes. I think that we should try to avoid pacemaker implantation.  I would recommend that we keep him in sinus rhythm (as above). If he has episodes of afib/ atrial flutter, he is instructed to not drive, climb ladders, etc which would cause injury with syncope.  If he has further presyncope or syncope, then he should contact our office.

## 2011-01-30 NOTE — Telephone Encounter (Signed)
Pt called He was her this morning but had some questions about the meds he received.

## 2011-01-30 NOTE — Assessment & Plan Note (Signed)
The patient was recently admitted with atrial flutter, not completely typical appearing by ekg.  He also has a h/o afib. Therapeutic strategies for afib and atrial flutter including medicine and ablation were discussed in detail with the patient today.  As he has both afib and atrial flutter, I would recommend medical therapy as our initial strategy.  If he fails medical therapy, then I would favor afib and atrial flutter ablation at that time.  We will start flecainide 50mg  BID.  This can be increased to 100mg  BID as needed.  He will have a GXT to evaluate for exercise induced arrhythmias/ ischemia by Dr Anne Fu in 2-3 weeks. Continue cardizem and pradaxa long term. I will see him as needed.

## 2011-02-11 MED FILL — Metoprolol Tartrate IV Soln 5 MG/5ML: INTRAVENOUS | Qty: 5 | Status: AC

## 2011-06-13 ENCOUNTER — Other Ambulatory Visit: Payer: Self-pay | Admitting: Gastroenterology

## 2011-08-19 ENCOUNTER — Other Ambulatory Visit: Payer: Self-pay | Admitting: Internal Medicine

## 2011-08-19 MED ORDER — FLECAINIDE ACETATE 50 MG PO TABS: 50.0000 mg | ORAL_TABLET | Freq: Two times a day (BID) | ORAL | Status: DC

## 2011-11-28 ENCOUNTER — Other Ambulatory Visit: Payer: Self-pay | Admitting: Internal Medicine

## 2011-11-28 MED ORDER — FLECAINIDE ACETATE 50 MG PO TABS: 50.0000 mg | ORAL_TABLET | Freq: Two times a day (BID) | ORAL | Status: DC

## 2012-01-23 ENCOUNTER — Other Ambulatory Visit: Payer: Self-pay | Admitting: *Deleted

## 2012-01-23 MED ORDER — FLECAINIDE ACETATE 50 MG PO TABS: 50.0000 mg | ORAL_TABLET | Freq: Two times a day (BID) | ORAL | Status: DC

## 2012-02-21 ENCOUNTER — Other Ambulatory Visit: Payer: Self-pay | Admitting: *Deleted

## 2012-02-21 MED ORDER — FLECAINIDE ACETATE 50 MG PO TABS: 50.0000 mg | ORAL_TABLET | Freq: Two times a day (BID) | ORAL | Status: DC

## 2012-02-24 ENCOUNTER — Other Ambulatory Visit: Payer: Self-pay | Admitting: *Deleted

## 2012-02-24 MED ORDER — FLECAINIDE ACETATE 50 MG PO TABS: 50.0000 mg | ORAL_TABLET | Freq: Two times a day (BID) | ORAL | Status: DC

## 2012-03-20 ENCOUNTER — Other Ambulatory Visit: Payer: Self-pay | Admitting: Internal Medicine

## 2012-03-20 MED ORDER — FLECAINIDE ACETATE 50 MG PO TABS: 50.0000 mg | ORAL_TABLET | Freq: Two times a day (BID) | ORAL | Status: DC

## 2012-03-30 ENCOUNTER — Other Ambulatory Visit: Payer: Self-pay | Admitting: Internal Medicine

## 2012-04-03 ENCOUNTER — Encounter (HOSPITAL_COMMUNITY)
Admission: RE | Admit: 2012-04-03 | Discharge: 2012-04-03 | Disposition: A | Discharge status: Home / Self Care | Payer: Medicare Other | Source: Ambulatory Visit | Attending: Internal Medicine | Admitting: Internal Medicine

## 2012-06-18 ENCOUNTER — Other Ambulatory Visit: Payer: Self-pay | Admitting: Internal Medicine

## 2012-06-19 ENCOUNTER — Encounter (HOSPITAL_COMMUNITY): Payer: Self-pay

## 2012-06-23 ENCOUNTER — Encounter (HOSPITAL_COMMUNITY): Payer: Self-pay

## 2012-06-24 ENCOUNTER — Encounter (HOSPITAL_COMMUNITY)
Admission: RE | Admit: 2012-06-24 | Discharge: 2012-06-24 | Disposition: A | Discharge status: Home / Self Care | Payer: Medicare Other | Source: Ambulatory Visit | Attending: Internal Medicine | Admitting: Internal Medicine

## 2012-06-24 LAB — POCT HEMOGLOBIN-HEMACUE: Hemoglobin: 13.4 g/dL (ref 13.0–17.0)

## 2012-06-24 MED ORDER — LIDOCAINE HCL 2 % IJ SOLN: 0.1000 mL | Freq: Once | INTRAMUSCULAR | Status: DC

## 2012-06-24 NOTE — Progress Notes (Signed)
Hemacue 13.6.  Removed 500cc blood from pts (L) arm by phlebotomy without complications.  Pt tolerated well.  Pt ate crackers and drank OJ and had stable VS before discharge.

## 2012-07-20 ENCOUNTER — Other Ambulatory Visit: Payer: Self-pay | Admitting: Emergency Medicine

## 2012-07-20 ENCOUNTER — Telehealth: Payer: Self-pay | Admitting: Internal Medicine

## 2012-07-20 MED ORDER — FLECAINIDE ACETATE 50 MG PO TABS: 50.0000 mg | ORAL_TABLET | Freq: Two times a day (BID) | ORAL | Status: DC

## 2012-07-20 NOTE — Telephone Encounter (Signed)
New problem   Pt is returning a call from [place]. Please call pt

## 2012-08-24 ENCOUNTER — Other Ambulatory Visit (HOSPITAL_COMMUNITY): Payer: Self-pay | Admitting: *Deleted

## 2012-08-24 ENCOUNTER — Ambulatory Visit: Payer: Self-pay | Admitting: Internal Medicine

## 2012-08-25 ENCOUNTER — Encounter (HOSPITAL_COMMUNITY)
Admission: RE | Admit: 2012-08-25 | Discharge: 2012-08-25 | Disposition: A | Discharge status: Home / Self Care | Payer: Medicare Other | Source: Ambulatory Visit | Attending: Internal Medicine | Admitting: Internal Medicine

## 2012-08-25 LAB — POCT HEMOGLOBIN-HEMACUE: Hemoglobin: 15.3 g/dL (ref 13.0–17.0)

## 2012-08-25 MED ORDER — LIDOCAINE HCL 2 % IJ SOLN
0.1000 mL | Freq: Once | INTRAMUSCULAR | Status: DC
  Filled 2012-08-25: qty 10

## 2012-08-25 NOTE — Progress Notes (Signed)
Renee richards rn removed 125cc of blood from left AC and the line clotted off.  I then at the pr's request removed 375cc of blood from the right [facility] and the pt tolerated the procedure well

## 2012-09-03 ENCOUNTER — Ambulatory Visit (INDEPENDENT_AMBULATORY_CARE_PROVIDER_SITE_OTHER): Payer: Medicare Other | Admitting: Internal Medicine

## 2012-09-03 ENCOUNTER — Encounter: Payer: Self-pay | Admitting: Internal Medicine

## 2012-09-03 VITALS — BP 137/74 | HR 51 | Ht 74.0 in | Wt 235.8 lb

## 2012-09-03 DIAGNOSIS — I4891 Unspecified atrial fibrillation: Secondary | ICD-10-CM

## 2012-09-03 DIAGNOSIS — I10 Essential (primary) hypertension: Secondary | ICD-10-CM

## 2012-09-03 NOTE — Patient Instructions (Addendum)
Your physician wants you to follow-up in: 12 months with Dr Allred You will receive a reminder letter in the mail two months in advance. If you don't receive a letter, please call our office to schedule the follow-up appointment.  

## 2012-09-06 NOTE — Progress Notes (Signed)
Primary Cardiologist:  Dr Garnette Gunner Glasby is a 67 y.o. male who presents today for routine electrophysiology followup.  Since last being seen in our clinic, the patient reports doing very well.  Today, he denies symptoms of palpitations, chest pain, shortness of breath,  lower extremity edema, dizziness, presyncope, or syncope.  The patient is otherwise without complaint today.   Past Medical History  Diagnosis Date  . Atrial fib/flutter, transient   . Acid reflux   . Hypertension   . FHx: allergies   . Sleep apnea     After weight loss, off of CPAP  . 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  . Seasonal allergies    Past Surgical History  Procedure Laterality Date  . Nasal sinus surgery  1997    Current Outpatient Prescriptions  Medication Sig Dispense Refill  . albuterol (PROVENTIL HFA;VENTOLIN HFA) 108 (90 BASE) MCG/ACT inhaler Inhale 2 puffs into the lungs every 6 (six) hours as needed. For shortness of breath or wheeze        . allopurinol (ZYLOPRIM) 100 MG tablet       . cholecalciferol (VITAMIN D) 1000 UNITS tablet Take 2,000 Units by mouth 2 (two) times daily.        Marland Kitchen COLCRYS 0.6 MG tablet       . diltiazem (CARDIZEM CD) 120 MG 24 hr capsule Take 1 capsule (120 mg total) by mouth daily.  30 capsule  12  . fexofenadine (ALLEGRA) 60 MG tablet Take 60 mg by mouth daily as needed. For allergies       . flecainide (TAMBOCOR) 50 MG tablet Take 1 tablet (50 mg total) by mouth 2 (two) times daily.  60 tablet  0  . fluticasone (FLONASE) 50 MCG/ACT nasal spray Place 2 sprays into the nose daily.        . Irbesartan (AVAPRO PO) Take 150 mg by mouth daily.       . LUTEIN-ZEAXANTHIN PO Take 1 tablet by mouth daily.        . Multiple Vitamins-Minerals (MULTIVITAMINS THER. W/MINERALS) TABS Take 1 tablet by mouth daily.        . mupirocin ointment (BACTROBAN) 2 %       . omega-3 acid ethyl esters (LOVAZA) 1 G  capsule Take 2 g by mouth daily.       . Ranitidine HCl (ZANTAC 75 PO) Take 300 mg by mouth.       . warfarin (COUMADIN) 5 MG tablet        No current facility-administered medications for this visit.    Physical Exam: Filed Vitals:   09/03/12 1133  BP: 137/74  Pulse: 51  Height: 6\' 2"  (1.88 m)  Weight: 235 lb 12.8 oz (106.958 kg)    GEN- The patient is well appearing, alert and oriented x 3 today.   Head- normocephalic, atraumatic Eyes-  Sclera clear, conjunctiva pink Ears- hearing intact Oropharynx- clear Lungs- Clear to ausculation bilaterally, normal work of breathing Heart- Regular rate and rhythm, no murmurs, rubs or gallops, PMI not laterally displaced GI- soft, NT, ND, + BS Extremities- no clubbing, cyanosis, or edema  ekg today reveals sinus bradycardia 52 bpm, incomplete RBBB  Assessment and Plan:  1. afib Well controlled Continue current therapy Anticoagulated with coumadin  2. HTN Stable No change required today  Return in 1 year

## 2012-09-15 ENCOUNTER — Other Ambulatory Visit: Payer: Self-pay | Admitting: Internal Medicine

## 2012-10-08 ENCOUNTER — Other Ambulatory Visit: Payer: Self-pay

## 2012-10-15 ENCOUNTER — Other Ambulatory Visit: Payer: Self-pay | Admitting: *Deleted

## 2012-10-15 MED ORDER — FLECAINIDE ACETATE 50 MG PO TABS: ORAL_TABLET | ORAL | Status: DC

## 2012-10-15 NOTE — Telephone Encounter (Signed)
Refill sent.

## 2012-10-27 ENCOUNTER — Other Ambulatory Visit: Payer: Self-pay | Admitting: Internal Medicine

## 2012-10-27 ENCOUNTER — Encounter (HOSPITAL_COMMUNITY)
Admission: RE | Admit: 2012-10-27 | Discharge: 2012-10-27 | Disposition: A | Discharge status: Home / Self Care | Payer: Medicare Other | Source: Ambulatory Visit | Attending: Internal Medicine | Admitting: Internal Medicine

## 2012-10-27 LAB — POCT HEMOGLOBIN-HEMACUE: Hemoglobin: 15.1 g/dL (ref 13.0–17.0)

## 2012-10-27 NOTE — Progress Notes (Signed)
Phlebotomized 500 ml from left A/C on first attempt. Pt refused lidocaine. Tolerated well.

## 2012-12-13 ENCOUNTER — Encounter: Payer: Self-pay | Admitting: Cardiology

## 2012-12-13 DIAGNOSIS — Q211 Atrial septal defect, unspecified: Secondary | ICD-10-CM | POA: Insufficient documentation

## 2012-12-13 DIAGNOSIS — F17200 Nicotine dependence, unspecified, uncomplicated: Secondary | ICD-10-CM | POA: Insufficient documentation

## 2012-12-13 DIAGNOSIS — F329 Major depressive disorder, single episode, unspecified: Secondary | ICD-10-CM | POA: Insufficient documentation

## 2012-12-13 DIAGNOSIS — I10 Essential (primary) hypertension: Secondary | ICD-10-CM | POA: Insufficient documentation

## 2012-12-13 DIAGNOSIS — G4733 Obstructive sleep apnea (adult) (pediatric): Secondary | ICD-10-CM | POA: Insufficient documentation

## 2012-12-13 DIAGNOSIS — F3289 Other specified depressive episodes: Secondary | ICD-10-CM | POA: Insufficient documentation

## 2012-12-13 DIAGNOSIS — K219 Gastro-esophageal reflux disease without esophagitis: Secondary | ICD-10-CM | POA: Insufficient documentation

## 2012-12-13 DIAGNOSIS — Z7901 Long term (current) use of anticoagulants: Secondary | ICD-10-CM | POA: Insufficient documentation

## 2012-12-13 DIAGNOSIS — R7301 Impaired fasting glucose: Secondary | ICD-10-CM | POA: Insufficient documentation

## 2012-12-13 DIAGNOSIS — E785 Hyperlipidemia, unspecified: Secondary | ICD-10-CM | POA: Insufficient documentation

## 2012-12-13 DIAGNOSIS — M109 Gout, unspecified: Secondary | ICD-10-CM | POA: Insufficient documentation

## 2012-12-13 DIAGNOSIS — N529 Male erectile dysfunction, unspecified: Secondary | ICD-10-CM | POA: Insufficient documentation

## 2012-12-21 ENCOUNTER — Ambulatory Visit: Payer: Self-pay | Admitting: Cardiology

## 2013-01-12 ENCOUNTER — Ambulatory Visit (INDEPENDENT_AMBULATORY_CARE_PROVIDER_SITE_OTHER): Payer: Medicare Other | Admitting: Pharmacist

## 2013-01-12 DIAGNOSIS — I4891 Unspecified atrial fibrillation: Secondary | ICD-10-CM

## 2013-01-12 LAB — POCT INR: INR: 2.2

## 2013-01-29 ENCOUNTER — Other Ambulatory Visit: Payer: Self-pay | Admitting: Cardiology

## 2013-02-09 ENCOUNTER — Ambulatory Visit (INDEPENDENT_AMBULATORY_CARE_PROVIDER_SITE_OTHER): Payer: Medicare Other | Admitting: Pharmacist

## 2013-02-09 DIAGNOSIS — I4891 Unspecified atrial fibrillation: Secondary | ICD-10-CM

## 2013-03-04 ENCOUNTER — Ambulatory Visit (INDEPENDENT_AMBULATORY_CARE_PROVIDER_SITE_OTHER): Payer: Medicare Other | Admitting: Pharmacist

## 2013-03-04 DIAGNOSIS — I4891 Unspecified atrial fibrillation: Secondary | ICD-10-CM

## 2013-03-26 ENCOUNTER — Encounter (HOSPITAL_COMMUNITY)
Admission: RE | Admit: 2013-03-26 | Discharge: 2013-03-26 | Disposition: A | Discharge status: Home / Self Care | Payer: Medicare Other | Source: Ambulatory Visit | Attending: Internal Medicine | Admitting: Internal Medicine

## 2013-03-26 MED ORDER — LIDOCAINE HCL 2 % IJ SOLN: 0.1000 mL | Freq: Once | INTRAMUSCULAR | Status: DC

## 2013-03-26 NOTE — Progress Notes (Signed)
POC Hgb 14.5.  Therapeutic phlebotomy performed without complications.  500 cc blood collected.  Pt offered po's.  Will monitor prior to discharge.

## 2013-03-29 LAB — POCT HEMOGLOBIN-HEMACUE: Hemoglobin: 14.5 g/dL (ref 13.0–17.0)

## 2013-04-06 ENCOUNTER — Ambulatory Visit (INDEPENDENT_AMBULATORY_CARE_PROVIDER_SITE_OTHER): Payer: Medicare Other | Admitting: Pharmacist

## 2013-04-06 ENCOUNTER — Telehealth: Payer: Self-pay | Admitting: Pharmacist

## 2013-04-06 DIAGNOSIS — I4891 Unspecified atrial fibrillation: Secondary | ICD-10-CM

## 2013-04-06 LAB — POCT INR: INR: 5

## 2013-04-06 NOTE — Telephone Encounter (Signed)
Received call from Dr. Sabra Heck stating that during a root canal, patient's tongue got hit by the drill and significant bleeding occurred.  He went to the oral surgeon right after, and INR was found to be at 5.0.  Patient on warfarin for Afib.  INRs had been stable recently.  Patient notified to hold warfarin today and tomorrow, then resume 10 mg qd except 5 mg Monday and recheck INR with me as scheduled next week.  Patient will call me later this week if bleeding hasn't stopped.

## 2013-04-14 ENCOUNTER — Ambulatory Visit (INDEPENDENT_AMBULATORY_CARE_PROVIDER_SITE_OTHER): Payer: Medicare Other | Admitting: *Deleted

## 2013-04-14 DIAGNOSIS — I4891 Unspecified atrial fibrillation: Secondary | ICD-10-CM

## 2013-04-14 LAB — POCT INR: INR: 2.6

## 2013-04-28 ENCOUNTER — Ambulatory Visit (INDEPENDENT_AMBULATORY_CARE_PROVIDER_SITE_OTHER): Payer: Medicare Other | Admitting: Pharmacist

## 2013-04-28 DIAGNOSIS — I4891 Unspecified atrial fibrillation: Secondary | ICD-10-CM

## 2013-04-28 LAB — POCT INR: INR: 2.7

## 2013-05-18 ENCOUNTER — Ambulatory Visit: Payer: Self-pay | Admitting: Cardiology

## 2013-05-18 ENCOUNTER — Encounter: Payer: Self-pay | Admitting: Cardiology

## 2013-05-18 ENCOUNTER — Ambulatory Visit (INDEPENDENT_AMBULATORY_CARE_PROVIDER_SITE_OTHER): Payer: Medicare Other | Admitting: Pharmacist

## 2013-05-18 ENCOUNTER — Ambulatory Visit (INDEPENDENT_AMBULATORY_CARE_PROVIDER_SITE_OTHER): Payer: Medicare Other | Admitting: Cardiology

## 2013-05-18 VITALS — BP 140/82 | HR 50 | Ht 76.0 in | Wt 242.0 lb

## 2013-05-18 DIAGNOSIS — I4891 Unspecified atrial fibrillation: Secondary | ICD-10-CM

## 2013-05-18 DIAGNOSIS — I10 Essential (primary) hypertension: Secondary | ICD-10-CM

## 2013-05-18 DIAGNOSIS — R635 Abnormal weight gain: Secondary | ICD-10-CM

## 2013-05-18 LAB — POCT INR: INR: 1.9

## 2013-05-18 NOTE — Progress Notes (Signed)
[facility]. [address], [address], [place]  [address] Phone: [phone] Fax:  [phone]  Date:  05/18/2013   ID:  Gwenyth Bender Mattia, DOB [birth year], MRN [id number]  PCP:  Horton Finer, MD   History of Present Illness: Jonathan Vargas is a 68 y.o. male with atrial fibrillation paroxysmal on antiarrhythmic, warfarin, diltiazem, anticoagulation here for followup.  Had stress test 09/01/12 that demonstrated a mild inferior wall defect, possible diaphragmatic attenuation, overall low risk study with normal ejection fraction. Hemachromatosis.   In early June of 2014, he began to feel some chest discomfort while walking, exertional down both arms that was relieved with rest. Noted when on treadmill when warming up. Had one experiecne when walking up stairs in a parking lot after a dinner show. Had 1/2 bottle of wine. This prompted stress test. No evidence of arrhythmia on treadmill. Worked out since then. Fleeting 4-5 min down both arms. Hypertension is well controlled. 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 Smoking cessation counseling.  Previously he has had a few palpitations, like heart stops for a split second and one second of pain then goes away. he had a good week this week but previously has felt occasional fluttering. At one point he felt his carotid and he felt as though he was in atrial fibrillation and he coughed and he went out. He is on low-dose flecainide. No syncope, no dizziness.  05/18/13-doing well. Has gained a little weight. Has stopped smoking. Very rare palpitations. No chest pain. Travel to [place]. Uncle fought in World War II.    Wt Readings from Last 3 Encounters:  05/18/13 242 lb (109.77 kg)  03/26/13 239 lb (108.41 kg)  10/27/12 235 lb (106.595 kg)     Past Medical History  Diagnosis Date  . Atrial fib/flutter, transient   . Acid reflux   . Hypertension   . FHx: allergies   . Sleep apnea     After weight loss, off of CPAP    . 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  . Seasonal allergies   . Hemochromatosis   . Impaired fasting glucose   . Hypercholesteremia   . Depression   . Asthma with allergic rhinitis and status asthmaticus   . Gout     , Possible, Right Knee x2 ; intermittent milder episodes of pain in hte first MTP - uric acid 7.7- no treatment so far.  . Erectile dysfunction   . Trigger finger, left     , fourth finger  . Hypogonadism male   . ENT complaint     Dr. Erik Obey  . ASD (atrial septal defect)   . Chronic anticoagulation   . Hyperlipemia     Past Surgical History  Procedure Laterality Date  . Nasal sinus surgery  1997  . Liver biopsy    . Colonoscopy w/ polypectomy      Current Outpatient Prescriptions  Medication Sig Dispense Refill  . allopurinol (ZYLOPRIM) 100 MG tablet Take 200 mg by mouth daily.       . cetirizine (ZYRTEC) 10 MG tablet Take 10 mg by mouth daily as needed for allergies.      . cholecalciferol (VITAMIN D) 1000 UNITS tablet Take 4,000 Units by mouth daily.       Marland Kitchen COLCRYS 0.6 MG tablet Take 0.6 mg by mouth 2 (two) times daily as needed.       . diltiazem (CARDIZEM CD) 120  MG 24 hr capsule TAKE ONE CAPSULE BY MOUTH EVERY DAY  90 capsule  3  . fexofenadine (ALLEGRA) 60 MG tablet Take 60 mg by mouth daily as needed. For allergies       . fish oil-omega-3 fatty acids 1000 MG capsule Take 2 g by mouth daily.      . flecainide (TAMBOCOR) 50 MG tablet TAKE 1 TABLET TWICE A DAY  60 tablet  11  . fluticasone (FLONASE) 50 MCG/ACT nasal spray Place 1 spray into the nose daily.       . Irbesartan (AVAPRO PO) Take 150 mg by mouth daily.       Marland Kitchen MAGNESIUM PO Take 2 capsules by mouth daily.      . Misc Natural Products (LUTEIN 20) CAPS Take 20 mg by mouth daily with breakfast.      . ranitidine (ZANTAC) 300 MG tablet Take 300 mg by mouth as needed for heartburn.      . Testosterone 10 MG/ACT (2%) GEL Apply  1 application topically daily.      Marland Kitchen UNABLE TO FIND as directed. Med Name: CPAP      . warfarin (COUMADIN) 5 MG tablet Take 10 mg by mouth daily.        No current facility-administered medications for this visit.    Allergies:    Allergies  Allergen Reactions  . Clindamycin/Lincomycin   . Sulfa Antibiotics Other (See Comments)    Headaches  . Amoxicillin Rash    Social History:  The patient  reports that he quit smoking about 3 months ago. He does not have any smokeless tobacco history on file. He reports that he drinks alcohol. He reports that he does not use illicit drugs.   ROS:  Please see the history of present illness.   Denies any fevers, chills, orthopnea, PND    PHYSICAL EXAM: VS:  BP 140/82  Pulse 50  Ht 6\' 4"  (1.93 m)  Wt 242 lb (109.77 kg)  BMI 29.47 kg/m2 Well nourished, well developed, in no acute distress HEENT: normal Neck: no JVD Cardiac:  normal S1, S2; RRR; no murmur Lungs:  clear to auscultation bilaterally, no wheezing, rhonchi or rales Abd: soft, nontender, no hepatomegaly Ext: no edema Skin: warm and dry Neuro: no focal abnormalities noted  EKG:  None today     ASSESSMENT AND PLAN:  1. Atrial fibrillation, paroxysmal-doing well, continue antiarrhythmic. No changes made. 2. Hypertension-minimally elevated today. Overall doing well. 3. Weight gain continue to monitor 4. Excellent Job at tobacco cessation.  Signed, Candee Furbish, MD [facility]  05/18/2013 3:29 PM

## 2013-05-18 NOTE — Patient Instructions (Signed)
Your physician recommends that you continue on your current medications as directed. Please refer to the Current Medication list given to you today.  Your physician wants you to follow-up in: 6 months with Dr. Skains. You will receive a reminder letter in the mail two months in advance. If you don't receive a letter, please call our office to schedule the follow-up appointment.  

## 2013-06-08 ENCOUNTER — Ambulatory Visit (INDEPENDENT_AMBULATORY_CARE_PROVIDER_SITE_OTHER): Payer: Medicare Other

## 2013-06-08 DIAGNOSIS — I4891 Unspecified atrial fibrillation: Secondary | ICD-10-CM

## 2013-06-08 LAB — POCT INR: INR: 2

## 2013-06-28 ENCOUNTER — Ambulatory Visit (INDEPENDENT_AMBULATORY_CARE_PROVIDER_SITE_OTHER): Payer: Medicare Other | Admitting: Pharmacist

## 2013-06-28 DIAGNOSIS — I4891 Unspecified atrial fibrillation: Secondary | ICD-10-CM

## 2013-06-28 LAB — POCT INR: INR: 2.4

## 2013-07-02 ENCOUNTER — Other Ambulatory Visit: Payer: Self-pay

## 2013-07-02 MED ORDER — IRBESARTAN 150 MG PO TABS: 150.0000 mg | ORAL_TABLET | Freq: Every day | ORAL | Status: DC

## 2013-07-05 NOTE — Telephone Encounter (Signed)
error 

## 2013-07-14 ENCOUNTER — Other Ambulatory Visit (HOSPITAL_COMMUNITY): Payer: Self-pay | Admitting: *Deleted

## 2013-07-15 ENCOUNTER — Encounter (HOSPITAL_COMMUNITY)
Admission: RE | Admit: 2013-07-15 | Discharge: 2013-07-15 | Disposition: A | Discharge status: Home / Self Care | Payer: Medicare Other | Source: Ambulatory Visit | Attending: Internal Medicine | Admitting: Internal Medicine

## 2013-07-15 LAB — POCT HEMOGLOBIN-HEMACUE: Hemoglobin: 13 g/dL (ref 13.0–17.0)

## 2013-07-15 NOTE — Progress Notes (Signed)
Hemocue HGB 13.0; 500 cc Phlebotomy completed right antecubital; pt. Tolerated well

## 2013-07-26 ENCOUNTER — Ambulatory Visit (INDEPENDENT_AMBULATORY_CARE_PROVIDER_SITE_OTHER): Payer: Medicare Other | Admitting: Pharmacist

## 2013-07-26 DIAGNOSIS — I4891 Unspecified atrial fibrillation: Secondary | ICD-10-CM

## 2013-07-26 LAB — POCT INR: INR: 2.3

## 2013-08-17 ENCOUNTER — Ambulatory Visit (INDEPENDENT_AMBULATORY_CARE_PROVIDER_SITE_OTHER): Payer: Medicare Other | Admitting: Pharmacist

## 2013-08-17 DIAGNOSIS — I4891 Unspecified atrial fibrillation: Secondary | ICD-10-CM

## 2013-08-17 LAB — POCT INR: INR: 2.1

## 2013-09-20 ENCOUNTER — Ambulatory Visit (INDEPENDENT_AMBULATORY_CARE_PROVIDER_SITE_OTHER): Payer: Medicare Other | Admitting: Pharmacist

## 2013-09-20 DIAGNOSIS — I4891 Unspecified atrial fibrillation: Secondary | ICD-10-CM

## 2013-09-20 LAB — POCT INR: INR: 2.5

## 2013-09-29 ENCOUNTER — Telehealth: Payer: Self-pay | Admitting: Cardiology

## 2013-09-29 NOTE — Telephone Encounter (Signed)
Patient on cefdinir for the next 10 days.  Patient advised to increase vitamin K intake while on the antibiotic, but would not need to change warfarin dose.  Recheck INR as scheduled in 2 weeks. He is to call back if this is changed to a different antibiotic.

## 2013-09-29 NOTE — Telephone Encounter (Signed)
Patient is taking a new antibiotic, he would like to speak with you. Please call and advise.

## 2013-09-30 ENCOUNTER — Telehealth: Payer: Self-pay | Admitting: Internal Medicine

## 2013-09-30 NOTE — Telephone Encounter (Signed)
This morning his HR went up to 100 and now it's normal after taking his medications.  He is going out of town today and will call when he gets back as it has been a year since he last saw Dr Rayann [name]

## 2013-09-30 NOTE — Telephone Encounter (Signed)
Patient is getting ready to leave and go out of town, he has questions about AFIB ( heart rate is 100). Please call and advise.

## 2013-10-14 ENCOUNTER — Other Ambulatory Visit: Payer: Self-pay | Admitting: Internal Medicine

## 2013-10-18 ENCOUNTER — Ambulatory Visit (INDEPENDENT_AMBULATORY_CARE_PROVIDER_SITE_OTHER): Payer: Medicare Other | Admitting: Pharmacist

## 2013-10-18 DIAGNOSIS — I4891 Unspecified atrial fibrillation: Secondary | ICD-10-CM

## 2013-10-18 LAB — POCT INR: INR: 2.3

## 2013-10-21 ENCOUNTER — Encounter (HOSPITAL_COMMUNITY)
Admission: RE | Admit: 2013-10-21 | Discharge: 2013-10-21 | Disposition: A | Discharge status: Home / Self Care | Payer: Medicare Other | Source: Ambulatory Visit | Attending: Internal Medicine | Admitting: Internal Medicine

## 2013-10-21 ENCOUNTER — Telehealth: Payer: Self-pay

## 2013-10-21 LAB — POCT HEMOGLOBIN-HEMACUE: Hemoglobin: 13.7 g/dL (ref 13.0–17.0)

## 2013-10-21 MED ORDER — LIDOCAINE HCL 2 % IJ SOLN: 0.1000 mL | Freq: Once | INTRAMUSCULAR | Status: DC

## 2013-10-21 MED ORDER — WARFARIN SODIUM 5 MG PO TABS: ORAL_TABLET | ORAL | Status: DC

## 2013-10-21 NOTE — Progress Notes (Signed)
Used right AC and phlebotomized 500cc and pt tolerated procedure well

## 2013-10-21 NOTE — Telephone Encounter (Signed)
Rx refill sent to pharmacy as requested. 

## 2013-11-09 ENCOUNTER — Other Ambulatory Visit: Payer: Self-pay

## 2013-11-10 ENCOUNTER — Encounter: Payer: Self-pay | Admitting: *Deleted

## 2013-11-10 ENCOUNTER — Ambulatory Visit (INDEPENDENT_AMBULATORY_CARE_PROVIDER_SITE_OTHER): Payer: Medicare Other | Admitting: Internal Medicine

## 2013-11-10 ENCOUNTER — Encounter: Payer: Self-pay | Admitting: Internal Medicine

## 2013-11-10 VITALS — BP 122/84 | HR 49 | Ht 74.0 in | Wt 240.0 lb

## 2013-11-10 DIAGNOSIS — I4891 Unspecified atrial fibrillation: Secondary | ICD-10-CM

## 2013-11-10 DIAGNOSIS — I483 Typical atrial flutter: Secondary | ICD-10-CM

## 2013-11-10 DIAGNOSIS — I4892 Unspecified atrial flutter: Secondary | ICD-10-CM

## 2013-11-10 DIAGNOSIS — I10 Essential (primary) hypertension: Secondary | ICD-10-CM

## 2013-11-10 DIAGNOSIS — R001 Bradycardia, unspecified: Secondary | ICD-10-CM

## 2013-11-10 DIAGNOSIS — G4733 Obstructive sleep apnea (adult) (pediatric): Secondary | ICD-10-CM

## 2013-11-10 DIAGNOSIS — I498 Other specified cardiac arrhythmias: Secondary | ICD-10-CM

## 2013-11-10 NOTE — Progress Notes (Signed)
    MDT SEEQ event monitor ordered for patient per Dr Rayann [name].

## 2013-11-10 NOTE — Patient Instructions (Signed)
Your physician recommends that you schedule a follow-up appointment in: 6 weeks with Dr Allred  Your physician has recommended that you wear an event monitor. Event monitors are medical devices that record the heart's electrical activity. Doctors most often us these monitors to diagnose arrhythmias. Arrhythmias are problems with the speed or rhythm of the heartbeat. The monitor is a small, portable device. You can wear one while you do your normal daily activities. This is usually used to diagnose what is causing palpitations/syncope (passing out).    

## 2013-11-11 ENCOUNTER — Encounter: Payer: Self-pay | Admitting: Internal Medicine

## 2013-11-11 NOTE — Progress Notes (Signed)
Primary Cardiologist:  Dr Tiana Loft Hazel is a 68 y.o. male who presents today for routine electrophysiology followup.  Since last being seen in our clinic, the patient reports doing reasonably well.  Recently, he has begun having increased frequency of palpitations which he thinks may be due to afib.  He has fatigue which he attributes to medical therapy. Today, he denies symptoms of chest pain, shortness of breath,  lower extremity edema, dizziness, presyncope, or syncope.  The patient is otherwise without complaint today.   Past Medical History  Diagnosis Date  . Atrial fib/flutter, transient   . Acid reflux   . Hypertension   . FHx: allergies   . Sleep apnea     After weight loss, off of CPAP  . 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  . Seasonal allergies   . Hemochromatosis   . Impaired fasting glucose   . Hypercholesteremia   . Depression   . Asthma with allergic rhinitis and status asthmaticus   . Gout     , Possible, Right Knee x2 ; intermittent milder episodes of pain in hte first MTP - uric acid 7.7- no treatment so far.  . Erectile dysfunction   . Trigger finger, left     , fourth finger  . Hypogonadism male   . ENT complaint     Dr. Erik Obey  . ASD (atrial septal defect)   . Chronic anticoagulation   . Hyperlipemia    Past Surgical History  Procedure Laterality Date  . Nasal sinus surgery  1997  . Liver biopsy    . Colonoscopy w/ polypectomy      Current Outpatient Prescriptions  Medication Sig Dispense Refill  . allopurinol (ZYLOPRIM) 100 MG tablet Take 200 mg by mouth daily.       . cetirizine (ZYRTEC) 10 MG tablet Take 10 mg by mouth daily as needed for allergies.      . cholecalciferol (VITAMIN D) 1000 UNITS tablet Take 4,000 Units by mouth daily.       Marland Kitchen COLCRYS 0.6 MG tablet Take 0.6 mg by mouth 2 (two) times daily as needed.       . diltiazem (CARDIZEM CD) 120 MG 24 hr capsule  TAKE ONE CAPSULE BY MOUTH EVERY DAY  90 capsule  3  . fexofenadine (ALLEGRA) 60 MG tablet Take 60 mg by mouth daily as needed. For allergies      . fish oil-omega-3 fatty acids 1000 MG capsule Take 2 g by mouth daily.      . flecainide (TAMBOCOR) 50 MG tablet TAKE 1 TABLET TWICE A DAY  60 tablet  0  . fluticasone (FLONASE) 50 MCG/ACT nasal spray Place 1 spray into the nose daily.       . irbesartan (AVAPRO) 150 MG tablet Take 1 tablet (150 mg total) by mouth daily.  90 tablet  2  . Misc Natural Products (LUTEIN 20) CAPS Take 20 mg by mouth daily with breakfast.      . NON FORMULARY CPAP      . ranitidine (ZANTAC) 300 MG tablet Take 300 mg by mouth as needed for heartburn.      . Testosterone 10 MG/ACT (2%) GEL Apply 1 application topically daily.      Marland Kitchen warfarin (COUMADIN) 5 MG tablet Take as directed by anticoagulation clinic  60 tablet  3   No current facility-administered medications for this visit.    Physical Exam: Filed Vitals:  11/10/13 1206  BP: 122/84  Pulse: 49  Height: 6\' 2"  (1.88 m)  Weight: 240 lb (108.863 kg)    GEN- The patient is well appearing, alert and oriented x 3 today.   Head- normocephalic, atraumatic Eyes-  Sclera clear, conjunctiva pink Ears- hearing intact Oropharynx- clear Lungs- Clear to ausculation bilaterally, normal work of breathing Heart- Regular rate and rhythm, no murmurs, rubs or gallops, PMI not laterally displaced GI- soft, NT, ND, + BS Extremities- no clubbing, cyanosis, or edema  ekg today reveals sinus bradycardia  Epic records are reviewed Echo from 2012 is reviewed  Assessment and Plan:  1. Afib/ atrial flutter He reports increasing palpitations suggestive of increased afib I think that it is necessary to monitor for further characterization of his palpitations.  If he is having additional atrial fibrillation or atrial flutter then I would advise ablation.  He is reluctant to increase flecainide due to fatigue and really is not  interested in any other medicines at this time. I will order a [employer] SEAQ for arrhythmia monitoring.  2. HTN Stable No change required today  3. Fatigue Likely due to flecainide and AV nodal agents No changes today  Return in 6 weeks to follow-up on the results of his monitor

## 2013-11-14 ENCOUNTER — Other Ambulatory Visit: Payer: Self-pay | Admitting: Internal Medicine

## 2013-11-29 ENCOUNTER — Ambulatory Visit (INDEPENDENT_AMBULATORY_CARE_PROVIDER_SITE_OTHER): Payer: Medicare Other | Admitting: Pharmacist

## 2013-11-29 DIAGNOSIS — I4891 Unspecified atrial fibrillation: Secondary | ICD-10-CM

## 2013-11-29 LAB — POCT INR: INR: 2.9

## 2013-12-21 ENCOUNTER — Ambulatory Visit (INDEPENDENT_AMBULATORY_CARE_PROVIDER_SITE_OTHER): Payer: Medicare Other | Admitting: Neurology

## 2013-12-21 ENCOUNTER — Encounter (INDEPENDENT_AMBULATORY_CARE_PROVIDER_SITE_OTHER): Payer: Self-pay

## 2013-12-21 ENCOUNTER — Encounter: Payer: Self-pay | Admitting: Neurology

## 2013-12-21 VITALS — BP 139/77 | HR 65 | Temp 98.0°F | Resp 16 | Ht 74.0 in | Wt 245.0 lb

## 2013-12-21 DIAGNOSIS — H5712 Ocular pain, left eye: Secondary | ICD-10-CM

## 2013-12-21 DIAGNOSIS — H5702 Anisocoria: Secondary | ICD-10-CM

## 2013-12-21 NOTE — Progress Notes (Signed)
Subjective:    Patient ID: Jonathan Vargas is a 68 y.o. male.  HPI    Star Age, MD, PhD [facility] [address], [address] [place], [place] [address]  Dear Ernst Bowler,   I saw your patient, Jonathan Vargas, upon your kind request in my neurologic clinic today for initial consultation of his third nerve palsy. The patient is unaccompanied today. 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 Jannifer Franklin), seasonal allergies, hemochromatosis, impaired fasting glucose, hyperlipidemia, depression, asthma, gout, erectile dysfunction, hyperlipidemia, who has noted unequal pupil size with left bigger than right for the past 3 months, with eye pain, shooting to the L temporal or parietal area, intermittent for the past week.  He has noted a sharp pain behind the L eye for about a week. He has mild eye pain with moving his L eye. He has no headaches. Never had TIA or stroke symptoms, denying sudden onset of one sided weakness, numbness, tingling, slurring of speech or droopy face, hearing loss, tinnitus, diplopia or visual field cut or monocular loss of vision, and denies recurrent headaches. His VA and VF are stable, he feels.    His Past Medical History Is Significant For: Past Medical History  Diagnosis Date  . Atrial fib/flutter, transient   . Acid reflux   . Hypertension   . FHx: allergies   . Sleep apnea     After weight loss, off of CPAP  . 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  . Seasonal allergies   . Hemochromatosis   . Impaired fasting glucose   . Hypercholesteremia   . Depression   . Asthma with allergic  rhinitis and status asthmaticus   . Gout     , Possible, Right Knee x2 ; intermittent milder episodes of pain in hte first MTP - uric acid 7.7- no treatment so far.  . Erectile dysfunction   . Trigger finger, left     , fourth finger  . Hypogonadism male   . ENT complaint     Dr. Erik Obey  . ASD (atrial septal defect)   . Chronic anticoagulation   . Hyperlipemia     His Past Surgical History Is Significant For: Past Surgical History  Procedure Laterality Date  . Nasal sinus surgery  1997  . Liver biopsy    . Colonoscopy w/ polypectomy      His Family History Is Significant For: Family History  Problem Relation Age of Onset  . Stroke Father     stroke  . Diabetes type II Father   . Heart disease Mother     with pacemaker  . Diabetes type II Maternal Grandfather     His Social History Is Significant For: History   Social History  . Marital Status: Divorced    Spouse Name: N/A    Number of Children: N/A  . Years of Education: Bachelors   Occupational History  . retired    Social History Main Topics  . Smoking status: Former Smoker    Quit date: 01/18/2013  . Smokeless tobacco: None     Comment: trying to quit  . Alcohol Use: Yes     Comment: 2-3 glasses of red wine per night  . Drug Use:  No  . Sexual Activity: None   Other Topics Concern  . None   Social History Narrative   Pt lives in [place] alone.  Retired from [employer]. Right handed. Caffeine 3 cups     His Allergies Are:  Allergies  Allergen Reactions  . Clindamycin/Lincomycin Diarrhea and Other (See Comments)    Stomach pain   . Sulfa Antibiotics Other (See Comments)    Headaches  . Amoxicillin Rash  :   His Current Medications Are:  Outpatient Encounter Prescriptions as of 12/21/2013  Medication Sig  . allopurinol (ZYLOPRIM) 100 MG tablet Take 200 mg by mouth daily.   . cetirizine (ZYRTEC) 10 MG tablet Take 10 mg by mouth daily as needed for allergies.  .  cholecalciferol (VITAMIN D) 1000 UNITS tablet Take 4,000 Units by mouth daily.   Marland Kitchen COLCRYS 0.6 MG tablet Take 0.6 mg by mouth 2 (two) times daily as needed.   . diltiazem (CARDIZEM CD) 120 MG 24 hr capsule TAKE ONE CAPSULE BY MOUTH EVERY DAY  . fexofenadine (ALLEGRA) 60 MG tablet Take 60 mg by mouth daily as needed. For allergies  . fish oil-omega-3 fatty acids 1000 MG capsule Take 2 g by mouth daily.  . flecainide (TAMBOCOR) 50 MG tablet TAKE 1 TABLET BY MOUTH TWICE A DAY  . fluticasone (FLONASE) 50 MCG/ACT nasal spray Place 1 spray into the nose daily.   . irbesartan (AVAPRO) 150 MG tablet Take 1 tablet (150 mg total) by mouth daily.  . Misc Natural Products (LUTEIN 20) CAPS Take 20 mg by mouth daily with breakfast.  . NON FORMULARY CPAP  . ranitidine (ZANTAC) 300 MG tablet Take 300 mg by mouth as needed for heartburn.  . Testosterone 10 MG/ACT (2%) GEL Apply 1 application topically daily.  Marland Kitchen warfarin (COUMADIN) 5 MG tablet Take as directed by anticoagulation clinic  :   Review of Systems:  Out of a complete 14 point review of systems, all are reviewed and negative with the exception of these symptoms as listed below:   Review of Systems  Eyes: Positive for pain.       L eye pain  Cardiovascular: Positive for palpitations.  Allergic/Immunologic: Positive for environmental allergies.       Dust, ragweed, trees    Objective:  Neurologic Exam  Physical Exam Physical Examination:   Filed Vitals:   12/21/13 1413  BP: 139/77  Pulse: 65  Temp: 98 F (36.7 C)  Resp: 16    General Examination: The patient is a very pleasant 68 y.o. male in no acute distress. He appears well-developed and well-nourished and well groomed. He is overweight.   HEENT: Normocephalic, atraumatic, pupils are unequal, round and reactive to light and accommodation. No APD was noted. Left pupil is about 2 mm larger but seems to be equally reactive as the right. Light reactivity is direct and consensual.  Visual fields are full by finger perimetry. He denies photophobia. He has no conjunctival injection. He reports mild pain with eye movement on the left. He has b/l cataracts and funduscopic exam is not possibly. Extraocular tracking is good without limitation to gaze excursion or nystagmus noted. Normal smooth pursuit is noted. Hearing is grossly intact. Tympanic membranes are clear bilaterally, but there is excess cerumen on the L. Face is symmetric with normal facial animation and normal facial sensation. Speech is clear with no dysarthria noted. There is no hypophonia. There is no lip, neck/head, jaw or voice tremor. Neck is supple with full  range of passive and active motion. There are no carotid bruits on auscultation. Oropharynx exam reveals: moderate mouth dryness, adequate dental hygiene and moderate airway crowding, due to larger uvula and larger tongue. Mallampati is class II. Tongue protrudes centrally and palate elevates symmetrically.   Chest: Clear to auscultation without wheezing, rhonchi or crackles noted.  Heart: S1+S2+0, regular and normal without murmurs, rubs or gallops noted.   Abdomen: Soft, non-tender and non-distended with normal bowel sounds appreciated on auscultation.  Extremities: There is trace pitting edema in the distal lower extremities bilaterally. Pedal pulses are intact.  Skin: Warm and dry without trophic changes noted. There are no varicose veins.  Musculoskeletal: exam reveals no obvious joint deformities, tenderness or joint swelling or erythema.   Neurologically:  Mental status: The patient is awake, alert and oriented in all 4 spheres. His immediate and remote memory, attention, language skills and fund of knowledge are appropriate. There is no evidence of aphasia, agnosia, apraxia or anomia. Speech is clear with normal prosody and enunciation. Thought process is linear. Mood is normal and affect is normal.  Cranial nerves II - XII are as described above  under HEENT exam. In addition: shoulder shrug is normal with equal shoulder height noted. Motor exam: Normal bulk, strength and tone is noted. There is no drift, tremor or rebound. Romberg is negative. Reflexes are 2+ throughout. Babinski: Toes are flexor bilaterally. Fine motor skills and coordination: intact with normal finger taps, normal hand movements, normal rapid alternating patting, normal foot taps and normal foot agility.  Cerebellar testing: No dysmetria or intention tremor on finger to nose testing. Heel to shin is unremarkable bilaterally. There is no truncal or gait ataxia.  Sensory exam: intact to light touch, pinprick, vibration, temperature sense in the upper and lower extremities.  Gait, station and balance: He stands easily. No veering to one side is noted. No leaning to one side is noted. Posture is age-appropriate and stance is narrow based. Gait shows normal stride length and normal pace. No problems turning are noted. He turns en bloc. Tandem walk is unremarkable. Intact toe and heel stance is noted.               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 Jannifer Franklin), seasonal allergies, hemochromatosis, impaired fasting glucose, hyperlipidemia, depression, asthma, gout, erectile dysfunction, hyperlipidemia, who has noted unequal pupil size with left bigger than right for the past 3 months, with eye pain, shooting to the L temporal or parietal area, intermittent for the past week. His physical and neurological exam are pertinent for  unequal pupil size. He has preserved visual fields. He does not report much in the way of eye pain except for mild pain with movement of his left eye. He  has no focal neurological findings otherwise. He is reassured in that regard. I would like to proceed with a brain MRI with and without contrast. He has no other findings suggestive of an acute vascular event. He has no other findings suggesting carotid dissection which he had in 1997. We will call him with his brain MRI results and take it from there. He is encouraged to followup with you as scheduled.  I answered all his questions today and the patient was  in agreement with the above outlined plan. Thank you very much for allowing me to participate in the care of this nice patient. If I can be of any further assistance to you please do not hesitate to call me at [phone].  Sincerely,   Star Age, MD, PhD

## 2013-12-21 NOTE — Patient Instructions (Addendum)
We will do a brain MRI with contrast for better understanding of your pupils and your eye pain. We will call you with your MRI results and will take it from there. Follow up with your eye doctor as scheduled.

## 2013-12-27 ENCOUNTER — Ambulatory Visit (INDEPENDENT_AMBULATORY_CARE_PROVIDER_SITE_OTHER): Payer: Medicare Other | Admitting: *Deleted

## 2013-12-27 ENCOUNTER — Encounter: Payer: Self-pay | Admitting: Internal Medicine

## 2013-12-27 ENCOUNTER — Ambulatory Visit (INDEPENDENT_AMBULATORY_CARE_PROVIDER_SITE_OTHER): Payer: Medicare Other | Admitting: Internal Medicine

## 2013-12-27 VITALS — BP 124/62 | HR 56 | Ht 74.0 in | Wt 244.0 lb

## 2013-12-27 DIAGNOSIS — I10 Essential (primary) hypertension: Secondary | ICD-10-CM

## 2013-12-27 DIAGNOSIS — I480 Paroxysmal atrial fibrillation: Secondary | ICD-10-CM

## 2013-12-27 DIAGNOSIS — I4891 Unspecified atrial fibrillation: Secondary | ICD-10-CM

## 2013-12-27 LAB — POCT INR: INR: 2.6

## 2013-12-27 NOTE — Progress Notes (Signed)
Primary Cardiologist:  Jonathan Vargas is a 68 y.o. male who presents today for routine electrophysiology followup.  Since last being seen in our clinic, wore a SEEQ 30 day monitor with his complaints of palpitations with h/o PAF. Monitor did not show any afib or arrhythmias.  He works out with a Clinical research associate 3x a week and feels well doing so.He reports having palps while wearing monitor but reassured no afib. He has seen neurology for pupil size being different with L>R and pain over left eye. He is pending a brain MRI next week.  Today, he denies symptoms of chest pain, shortness of breath,  lower extremity edema, dizziness, presyncope, or syncope.  The patient is otherwise without complaint today.   Past Medical History  Diagnosis Date  . Atrial fib/flutter, transient   . Acid reflux   . Hypertension   . FHx: allergies   . Sleep apnea     After weight loss, off of CPAP  . 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  . Seasonal allergies   . Hemochromatosis   . Impaired fasting glucose   . Hypercholesteremia   . Depression   . Asthma with allergic rhinitis and status asthmaticus   . Gout     , Possible, Right Knee x2 ; intermittent milder episodes of pain in hte first MTP - uric acid 7.7- no treatment so far.  . Erectile dysfunction   . Trigger finger, left     , fourth finger  . Hypogonadism male   . ENT complaint     Jonathan. Erik Obey  . ASD (atrial septal defect)   . Chronic anticoagulation   . Hyperlipemia    Past Surgical History  Procedure Laterality Date  . Nasal sinus surgery  1997  . Liver biopsy    . Colonoscopy w/ polypectomy      Current Outpatient Prescriptions  Medication Sig Dispense Refill  . allopurinol (ZYLOPRIM) 100 MG tablet Take 200 mg by mouth daily.       . cetirizine (ZYRTEC) 10 MG tablet Take 10 mg by mouth daily as needed for allergies.      . cholecalciferol (VITAMIN D) 1000 UNITS  tablet Take 4,000 Units by mouth daily.       Marland Kitchen COLCRYS 0.6 MG tablet Take 0.6 mg by mouth 2 (two) times daily as needed.       . diltiazem (CARDIZEM CD) 120 MG 24 hr capsule TAKE ONE CAPSULE BY MOUTH EVERY DAY  90 capsule  3  . fexofenadine (ALLEGRA) 60 MG tablet Take 60 mg by mouth daily as needed. For allergies      . fish oil-omega-3 fatty acids 1000 MG capsule Take 2 g by mouth daily.      . flecainide (TAMBOCOR) 50 MG tablet TAKE 1 TABLET BY MOUTH TWICE A DAY  60 tablet  1  . fluticasone (FLONASE) 50 MCG/ACT nasal spray Place 1 spray into the nose daily.       . irbesartan (AVAPRO) 150 MG tablet Take 1 tablet (150 mg total) by mouth daily.  90 tablet  2  . Misc Natural Products (LUTEIN 20) CAPS Take 20 mg by mouth daily with breakfast.      . NON FORMULARY CPAP      . ranitidine (ZANTAC) 300 MG tablet Take 300 mg by mouth as needed for heartburn.      . Testosterone 10 MG/ACT (2%) GEL Apply 1 application topically  daily.      . warfarin (COUMADIN) 5 MG tablet Take as directed by anticoagulation clinic  60 tablet  3   No current facility-administered medications for this visit.    Physical Exam: Filed Vitals:   12/27/13 1338  BP: 124/62  Pulse: 56  Height: 6\' 2"  (1.88 m)  Weight: 244 lb (110.678 kg)    GEN- The patient is well appearing, alert and oriented x 3 today.   Head- normocephalic, atraumatic Eyes-  Sclera clear, conjunctiva pink Ears- hearing intact Oropharynx- clear Lungs- Clear to ausculation bilaterally, normal work of breathing Heart- Regular rate and rhythm, no murmurs, rubs or gallops, PMI not laterally displaced GI- soft, NT, ND, + BS Extremities- no clubbing, cyanosis, or edema  ekg today reveals sinus bradycardia at 54 bpm, with LAD, QTc 56 bpm. Event monitor is reviewed and reveals sinus rhythm, no afib  Assessment and Plan:  1. Afib/ atrial flutter None identified on recent 30 day monitor. Continue current meds without change. Coumadin clinic  today.   2. HTN Stable No change required today  3. Fatigue- improved Likely due to flecainide and AV nodal agents No changes today  Continue current plan given low AF burden F/u Jonathan. Marlou Porch in 3 months, I will see as needed going forward.

## 2013-12-27 NOTE — Patient Instructions (Signed)
Your physician recommends that you schedule a follow-up appointment as needed with Dr Rayann [name] and 3 months with Dr Marlou Porch (move Nov apt out)

## 2013-12-29 ENCOUNTER — Encounter (HOSPITAL_COMMUNITY)
Admission: RE | Admit: 2013-12-29 | Discharge: 2013-12-29 | Disposition: A | Discharge status: Home / Self Care | Payer: Medicare Other | Source: Ambulatory Visit | Attending: Internal Medicine | Admitting: Internal Medicine

## 2013-12-29 LAB — POCT HEMOGLOBIN-HEMACUE: Hemoglobin: 14.6 g/dL (ref 13.0–17.0)

## 2013-12-29 NOTE — Progress Notes (Addendum)
450 cc therapeutic phlebotomy left antecubital fossae (bleeding stopped before ordered 500 cc)   No complaints.  Taking po fluids

## 2014-01-03 ENCOUNTER — Encounter: Payer: Self-pay | Admitting: Internal Medicine

## 2014-01-05 ENCOUNTER — Ambulatory Visit
Admission: RE | Admit: 2014-01-05 | Discharge: 2014-01-05 | Disposition: A | Discharge status: Home / Self Care | Payer: Medicare Other | Source: Ambulatory Visit | Attending: Neurology | Admitting: Neurology

## 2014-01-05 DIAGNOSIS — H5702 Anisocoria: Secondary | ICD-10-CM

## 2014-01-05 DIAGNOSIS — H5712 Ocular pain, left eye: Secondary | ICD-10-CM

## 2014-01-05 MED ORDER — GADOBENATE DIMEGLUMINE 529 MG/ML IV SOLN
20.0000 mL | Freq: Once | INTRAVENOUS | Status: AC | PRN
  Administered 2014-01-05: 20 mL via INTRAVENOUS

## 2014-01-06 NOTE — Progress Notes (Signed)
Quick Note:  Shared results with patient, verbalized understanding ______ 

## 2014-01-06 NOTE — Progress Notes (Signed)
Quick Note:  Please call patient regarding the recent brain MRI: The brain scan showed a normal structure of the brain and no significant volume loss which we call atrophy. There were changes in the deeper structures of the brain, which we call white matter changes or microvascular changes. These were reported as mild in His case. These are tiny white spots, that occur with time and are seen in a variety of conditions, including with normal aging, chronic hypertension, chronic headaches, especially migraine HAs, chronic diabetes, chronic hyperlipidemia. These are not strokes and no mass or lesion or contrast enhancement was seen which is reassuring. Again, there were no acute findings, such as a stroke, or mass or blood products. No further action is required on this test at this time, other than re-enforcing the importance of good blood pressure control, good cholesterol control, good blood sugar control, and weight management. Please remind patient to keep any upcoming appointments or tests and to call us with any interim questions, concerns, problems or updates. Thanks,  Star Age, MD, PhD    ______

## 2014-01-11 ENCOUNTER — Other Ambulatory Visit: Payer: Self-pay | Admitting: Internal Medicine

## 2014-01-21 ENCOUNTER — Ambulatory Visit: Payer: Self-pay | Admitting: Cardiology

## 2014-02-02 ENCOUNTER — Other Ambulatory Visit: Payer: Self-pay

## 2014-02-02 MED ORDER — DILTIAZEM HCL ER COATED BEADS 120 MG PO CP24: 120.0000 mg | ORAL_CAPSULE | Freq: Every day | ORAL | Status: DC

## 2014-02-06 ENCOUNTER — Other Ambulatory Visit: Payer: Self-pay | Admitting: Cardiology

## 2014-02-07 ENCOUNTER — Ambulatory Visit (INDEPENDENT_AMBULATORY_CARE_PROVIDER_SITE_OTHER): Payer: Medicare Other

## 2014-02-07 DIAGNOSIS — I4891 Unspecified atrial fibrillation: Secondary | ICD-10-CM

## 2014-02-07 LAB — POCT INR: INR: 2.4

## 2014-02-22 ENCOUNTER — Encounter (HOSPITAL_COMMUNITY): Payer: Medicare Other

## 2014-02-23 ENCOUNTER — Encounter (HOSPITAL_COMMUNITY)
Admission: RE | Admit: 2014-02-23 | Discharge: 2014-02-23 | Disposition: A | Discharge status: Home / Self Care | Payer: Medicare Other | Source: Ambulatory Visit | Attending: Internal Medicine | Admitting: Internal Medicine

## 2014-02-23 LAB — POCT HEMOGLOBIN-HEMACUE: HEMOGLOBIN: 15 g/dL (ref 13.0–17.0)

## 2014-02-23 NOTE — Progress Notes (Signed)
hemocue today is 15.  Phlebotomized 500cc of blood from right AC.  PT tolerated well.

## 2014-03-18 HISTORY — PX: CATARACT EXTRACTION W/ INTRAOCULAR LENS  IMPLANT, BILATERAL: SHX1307

## 2014-03-21 ENCOUNTER — Ambulatory Visit (INDEPENDENT_AMBULATORY_CARE_PROVIDER_SITE_OTHER): Payer: PPO

## 2014-03-21 DIAGNOSIS — I4891 Unspecified atrial fibrillation: Secondary | ICD-10-CM

## 2014-03-21 LAB — POCT INR: INR: 1.9

## 2014-03-25 ENCOUNTER — Other Ambulatory Visit: Payer: Self-pay | Admitting: *Deleted

## 2014-03-25 ENCOUNTER — Ambulatory Visit (INDEPENDENT_AMBULATORY_CARE_PROVIDER_SITE_OTHER): Payer: PPO | Admitting: Cardiology

## 2014-03-25 ENCOUNTER — Encounter: Payer: Self-pay | Admitting: Cardiology

## 2014-03-25 VITALS — BP 128/72 | HR 47 | Ht 74.0 in | Wt 246.0 lb

## 2014-03-25 DIAGNOSIS — I10 Essential (primary) hypertension: Secondary | ICD-10-CM

## 2014-03-25 DIAGNOSIS — I4892 Unspecified atrial flutter: Secondary | ICD-10-CM

## 2014-03-25 DIAGNOSIS — Z7901 Long term (current) use of anticoagulants: Secondary | ICD-10-CM

## 2014-03-25 MED ORDER — IRBESARTAN 150 MG PO TABS: 150.0000 mg | ORAL_TABLET | Freq: Every day | ORAL | Status: DC

## 2014-03-25 NOTE — Patient Instructions (Signed)
The current medical regimen is effective;  continue present plan and medications.  Follow up in 6 months with Dr. Skains.  You will receive a letter in the mail 2 months before you are due.  Please call us when you receive this letter to schedule your follow up appointment.  Thank you for choosing [facility]!!     

## 2014-03-25 NOTE — Progress Notes (Signed)
[facility]. [address] [place], [place]  [address] Phone: [phone] Fax:  [phone]  Date:  03/25/2014   ID:  Jonathan Vargas, DOB 1945/04/27, MRN [id number]  PCP:  Dorian Heckle, MD   History of Present Illness: Jonathan Vargas is a 69 y.o. male with atrial fibrillation/flutter paroxysmal on antiarrhythmic, warfarin, diltiazem, anticoagulation here for followup.  Had stress test 09/01/12 that demonstrated a mild inferior wall defect, possible diaphragmatic attenuation, overall low risk study with normal ejection fraction. Hemachromatosis.   Previous hospitalization in November 2012 demonstrated paroxysmal atrial flutter which converted after 4 hours of diltiazem. He had a postconversion pause of 8.2 seconds, felt flushed.   In early June of 2014, he began to feel some chest discomfort while walking, exertional down both arms that was relieved with rest. Noted when on treadmill when warming up. Had one experiecne when walking up stairs in a parking lot after a dinner show. Had 1/2 bottle of wine. This prompted stress test. No evidence of arrhythmia on treadmill. Worked out since then. Fleeting 4-5 min down both arms. Hypertension is well controlled. 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 Smoking cessation counseling.  Previously he has had a few palpitations, like heart stops for a split second and one second of pain then goes away. he had a good week this week but previously has felt occasional fluttering. At one point he felt his carotid and he felt as though he was in atrial fibrillation and he coughed and he went out. He is on low-dose flecainide. No syncope, no dizziness.  05/18/13-doing well. Has gained a little weight. Has stopped smoking. Very rare palpitations. No chest pain. Travel to [place]. Uncle fought in World War II.  03/25/14-overall feeling fairly well. I've reviewed office notes from [name] who had last visit with him on 12/27/13. Event  monitor at that time showed no evidence of atrial fibrillation. He will see him as needed going forward. Agrees with flecainide.  Wt Readings from Last 3 Encounters:  03/25/14 246 lb (111.585 kg)  02/23/14 240 lb (108.863 kg)  12/29/13 240 lb (108.863 kg)     Past Medical History  Diagnosis Date  . Atrial fib/flutter, transient   . Acid reflux   . Hypertension   . FHx: allergies   . Sleep apnea     After weight loss, off of CPAP  . 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  . Seasonal allergies   . Hemochromatosis   . Impaired fasting glucose   . Hypercholesteremia   . Depression   . Asthma with allergic rhinitis and status asthmaticus   . Gout     , Possible, Right Knee x2 ; intermittent milder episodes of pain in hte first MTP - uric acid 7.7- no treatment so far.  . Erectile dysfunction   . Trigger finger, left     , fourth finger  . Hypogonadism male   . ENT complaint     Dr. Erik Obey  . ASD (atrial septal defect)   . Chronic anticoagulation   . Hyperlipemia     Past Surgical History  Procedure Laterality Date  . Nasal sinus surgery  1997  . Liver biopsy    . Colonoscopy w/ polypectomy      Current Outpatient Prescriptions  Medication Sig Dispense Refill  . allopurinol (ZYLOPRIM) 100 MG tablet Take 200 mg by mouth daily.     . cetirizine (  ZYRTEC) 10 MG tablet Take 10 mg by mouth daily as needed for allergies.    . cholecalciferol (VITAMIN D) 1000 UNITS tablet Take 2,000 Units by mouth daily.     Marland Kitchen COLCRYS 0.6 MG tablet Take 0.6 mg by mouth 2 (two) times daily as needed.     . diltiazem (CARDIZEM CD) 120 MG 24 hr capsule Take 1 capsule (120 mg total) by mouth daily. 90 capsule 3  . fexofenadine (ALLEGRA) 60 MG tablet Take 60 mg by mouth daily as needed. For allergies    . fish oil-omega-3 fatty acids 1000 MG capsule Take 2 g by mouth daily.    . flecainide (TAMBOCOR) 50 MG tablet TAKE 1 TABLET BY  MOUTH TWICE A DAY 60 tablet 11  . fluticasone (FLONASE) 50 MCG/ACT nasal spray Place 1 spray into the nose daily.     . irbesartan (AVAPRO) 150 MG tablet Take 1 tablet (150 mg total) by mouth daily. 90 tablet 2  . Misc Natural Products (LUTEIN 20) CAPS Take 40 mg by mouth daily with breakfast.     . NON FORMULARY CPAP    . ranitidine (ZANTAC) 300 MG tablet Take 300 mg by mouth every evening.     . Testosterone 10 MG/ACT (2%) GEL Apply 1 application topically daily.    Marland Kitchen warfarin (COUMADIN) 5 MG tablet TAKE AS DIRECTED BY ANTICOAGULATION CLINIC 60 tablet 3   No current facility-administered medications for this visit.    Allergies:    Allergies  Allergen Reactions  . Clindamycin/Lincomycin Diarrhea and Other (See Comments)    Stomach pain   . Sulfa Antibiotics Other (See Comments)    Headaches  . Amoxicillin Rash    Social History:  The patient  reports that he quit smoking about 14 months ago. He does not have any smokeless tobacco history on file. He reports that he drinks alcohol. He reports that he does not use illicit drugs.   ROS:  Please see the history of present illness.   Denies any fevers, chills, orthopnea, PND    PHYSICAL EXAM: VS:  BP 128/72 mmHg  Pulse 47  Ht 6\' 2"  (1.88 m)  Wt 246 lb (111.585 kg)  BMI 31.57 kg/m2 Well nourished, well developed, in no acute distress HEENT: normal Neck: no JVD Cardiac:  normal S1, S2; bradycardic regular; no murmur Lungs:  clear to auscultation bilaterally, no wheezing, rhonchi or rales Abd: soft, nontender, no hepatomegalyOverweight Ext: no edema Skin: warm and dry Neuro: no focal abnormalities noted  EKG: 03/25/14-sinus bradycardia rate 47, no other abnormalities. PR interval 136 ms, QTC 394 ms.  Event monitor: 11/2013-no evidence of atrial fibrillation.    ASSESSMENT AND PLAN:  1. Atrial fibrillation, paroxysmal-doing well, continue antiarrhythmic. No changes made. Appreciate assistance from [name]. Office notes  reviewed. Has had atrial flutter as well. Continuing with low-dose flecainide, low-dose diltiazem. Heart rate is bradycardic. At times, he may feel lightheaded or symptomatic, one time he felt this after working out. After he hydrated and ate breakfast he felt better. Continue to watch this. Prior monitor showed no evidence of dangerous bradycardia. 2. Hypertension- Overall doing well. Previously minimally elevated 3. Weight gain continue to monitor. Working with Clinical research associate. Decrease carbohydrates 4. Fatigue-likely secondary to AV nodal blocking agents. Continue with exercise. 5. Excellent Job at tobacco cessation. 6. Six-month follow-up  Signed, Candee Furbish, MD [facility]  03/25/2014 11:34 AM

## 2014-04-27 ENCOUNTER — Encounter (HOSPITAL_COMMUNITY)
Admission: RE | Admit: 2014-04-27 | Discharge: 2014-04-27 | Disposition: A | Discharge status: Home / Self Care | Payer: PPO | Source: Ambulatory Visit | Attending: Internal Medicine | Admitting: Internal Medicine

## 2014-04-27 LAB — POCT HEMOGLOBIN-HEMACUE: Hemoglobin: 14.4 g/dL (ref 13.0–17.0)

## 2014-04-27 NOTE — Progress Notes (Signed)
1 500 cc phlebotomy right antecubital without incident. Patient tolerated well. Taking po fluids

## 2014-05-02 ENCOUNTER — Ambulatory Visit (INDEPENDENT_AMBULATORY_CARE_PROVIDER_SITE_OTHER): Payer: PPO | Admitting: *Deleted

## 2014-05-02 DIAGNOSIS — I4891 Unspecified atrial fibrillation: Secondary | ICD-10-CM

## 2014-05-02 LAB — POCT INR: INR: 1.9

## 2014-05-23 ENCOUNTER — Ambulatory Visit (INDEPENDENT_AMBULATORY_CARE_PROVIDER_SITE_OTHER): Payer: PPO | Admitting: Pharmacist

## 2014-05-23 DIAGNOSIS — I4891 Unspecified atrial fibrillation: Secondary | ICD-10-CM

## 2014-05-23 LAB — POCT INR: INR: 2

## 2014-06-07 ENCOUNTER — Other Ambulatory Visit: Payer: Self-pay | Admitting: Internal Medicine

## 2014-06-07 DIAGNOSIS — Z136 Encounter for screening for cardiovascular disorders: Secondary | ICD-10-CM

## 2014-06-13 ENCOUNTER — Ambulatory Visit
Admission: RE | Admit: 2014-06-13 | Discharge: 2014-06-13 | Disposition: A | Discharge status: Home / Self Care | Payer: PPO | Source: Ambulatory Visit | Attending: Internal Medicine | Admitting: Internal Medicine

## 2014-06-13 DIAGNOSIS — Z136 Encounter for screening for cardiovascular disorders: Secondary | ICD-10-CM

## 2014-06-20 ENCOUNTER — Ambulatory Visit (INDEPENDENT_AMBULATORY_CARE_PROVIDER_SITE_OTHER): Payer: PPO

## 2014-06-20 DIAGNOSIS — I4891 Unspecified atrial fibrillation: Secondary | ICD-10-CM

## 2014-06-20 LAB — POCT INR: INR: 1.9

## 2014-06-24 ENCOUNTER — Other Ambulatory Visit: Payer: Self-pay | Admitting: Cardiology

## 2014-06-29 ENCOUNTER — Encounter (HOSPITAL_COMMUNITY)
Admission: RE | Admit: 2014-06-29 | Discharge: 2014-06-29 | Disposition: A | Discharge status: Home / Self Care | Payer: PPO | Source: Ambulatory Visit | Attending: Internal Medicine | Admitting: Internal Medicine

## 2014-06-29 LAB — POCT HEMOGLOBIN-HEMACUE: Hemoglobin: 15.3 g/dL (ref 13.0–17.0)

## 2014-06-29 NOTE — Progress Notes (Signed)
Left AC used to phlebotomize 500cc and pt tolerated procedure well

## 2014-07-18 ENCOUNTER — Ambulatory Visit (INDEPENDENT_AMBULATORY_CARE_PROVIDER_SITE_OTHER): Payer: PPO | Admitting: *Deleted

## 2014-07-18 DIAGNOSIS — I4891 Unspecified atrial fibrillation: Secondary | ICD-10-CM | POA: Diagnosis not present

## 2014-07-18 LAB — POCT INR: INR: 1.7

## 2014-07-28 ENCOUNTER — Ambulatory Visit (INDEPENDENT_AMBULATORY_CARE_PROVIDER_SITE_OTHER): Payer: PPO | Admitting: Pharmacist

## 2014-07-28 DIAGNOSIS — I4891 Unspecified atrial fibrillation: Secondary | ICD-10-CM

## 2014-07-28 LAB — POCT INR: INR: 1.9

## 2014-08-12 ENCOUNTER — Telehealth: Payer: Self-pay | Admitting: *Deleted

## 2014-08-12 NOTE — Telephone Encounter (Signed)
Pt started Sulfamethazole TMP DS 1 bid on May 26 for sinus infection . Instructed that this medication does interact with coumadin so instructed  to eat serving of dark leafy greens each day while on this medication and reduce Coumadin to 10 mg daily and made an appt for him to be seen in coumadin clinic on May 31st and pt states understanding.

## 2014-08-16 ENCOUNTER — Ambulatory Visit (INDEPENDENT_AMBULATORY_CARE_PROVIDER_SITE_OTHER): Payer: PPO | Admitting: *Deleted

## 2014-08-16 DIAGNOSIS — I4891 Unspecified atrial fibrillation: Secondary | ICD-10-CM | POA: Diagnosis not present

## 2014-08-16 LAB — POCT INR: INR: 2.9

## 2014-08-23 ENCOUNTER — Other Ambulatory Visit (HOSPITAL_COMMUNITY): Payer: Self-pay | Admitting: *Deleted

## 2014-08-23 ENCOUNTER — Ambulatory Visit (INDEPENDENT_AMBULATORY_CARE_PROVIDER_SITE_OTHER): Payer: PPO | Admitting: *Deleted

## 2014-08-23 DIAGNOSIS — I4891 Unspecified atrial fibrillation: Secondary | ICD-10-CM | POA: Diagnosis not present

## 2014-08-23 LAB — POCT INR: INR: 2.2

## 2014-08-24 ENCOUNTER — Encounter (HOSPITAL_COMMUNITY)
Admission: RE | Admit: 2014-08-24 | Discharge: 2014-08-24 | Disposition: A | Discharge status: Home / Self Care | Payer: PPO | Source: Ambulatory Visit | Attending: Internal Medicine | Admitting: Internal Medicine

## 2014-08-24 LAB — POCT HEMOGLOBIN-HEMACUE: HEMOGLOBIN: 14.6 g/dL (ref 13.0–17.0)

## 2014-08-24 NOTE — Progress Notes (Signed)
Pt's hemocue 14.6.  500cc blood drawn off (R) a/c by phlebotomy. Pt tolerated well. Vital signs remained stable.

## 2014-09-06 ENCOUNTER — Ambulatory Visit (INDEPENDENT_AMBULATORY_CARE_PROVIDER_SITE_OTHER): Payer: PPO | Admitting: Surgery

## 2014-09-06 DIAGNOSIS — I4891 Unspecified atrial fibrillation: Secondary | ICD-10-CM | POA: Diagnosis not present

## 2014-09-06 LAB — POCT INR: INR: 2.3

## 2014-09-29 ENCOUNTER — Encounter: Payer: Self-pay | Admitting: Cardiology

## 2014-09-29 ENCOUNTER — Ambulatory Visit (INDEPENDENT_AMBULATORY_CARE_PROVIDER_SITE_OTHER): Payer: PPO | Admitting: *Deleted

## 2014-09-29 ENCOUNTER — Ambulatory Visit (INDEPENDENT_AMBULATORY_CARE_PROVIDER_SITE_OTHER): Payer: PPO | Admitting: Cardiology

## 2014-09-29 VITALS — BP 140/66 | HR 48 | Ht 74.0 in | Wt 238.4 lb

## 2014-09-29 DIAGNOSIS — I4892 Unspecified atrial flutter: Secondary | ICD-10-CM

## 2014-09-29 DIAGNOSIS — Z7901 Long term (current) use of anticoagulants: Secondary | ICD-10-CM | POA: Diagnosis not present

## 2014-09-29 DIAGNOSIS — M7989 Other specified soft tissue disorders: Secondary | ICD-10-CM

## 2014-09-29 DIAGNOSIS — E7800 Pure hypercholesterolemia, unspecified: Secondary | ICD-10-CM

## 2014-09-29 DIAGNOSIS — R001 Bradycardia, unspecified: Secondary | ICD-10-CM

## 2014-09-29 DIAGNOSIS — I4891 Unspecified atrial fibrillation: Secondary | ICD-10-CM

## 2014-09-29 LAB — POCT INR: INR: 2.6

## 2014-09-29 NOTE — Progress Notes (Signed)
[facility]. [address] [place], [place]  [address] Phone: [phone] Fax:  [phone]  Date:  09/29/2014   ID:  Jonathan Vargas, DOB 1945-06-04, MRN [id number]  PCP:  Lottie Dawson, MD   History of Present Illness: Jonathan Vargas is a 69 y.o. male with atrial fibrillation/flutter paroxysmal on antiarrhythmic, warfarin, diltiazem, anticoagulation here for followup.  Had stress test 09/01/12 that demonstrated a mild inferior wall defect, possible diaphragmatic attenuation, overall low risk study with normal ejection fraction. Hemachromatosis.   Previous hospitalization in November 2012 demonstrated paroxysmal atrial flutter which converted after 4 hours of diltiazem. He had a postconversion pause of 8.2 seconds, felt flushed.   In early June of 2014, he began to feel some chest discomfort while walking, exertional down both arms that was relieved with rest. Noted when on treadmill when warming up. Had one experiecne when walking up stairs in a parking lot after a dinner show. Had 1/2 bottle of wine. This prompted stress test. No evidence of arrhythmia on treadmill. Worked out since then. Fleeting 4-5 min down both arms. Hypertension is well controlled. 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 Smoking cessation counseling.  Previously he has had a few palpitations, like heart stops for a split second and one second of pain then goes away. he had a good week this week but previously has felt occasional fluttering. At one point he felt his carotid and he felt as though he was in atrial fibrillation and he coughed and he went out. He is on low-dose flecainide. No syncope, no dizziness.  05/18/13-doing well. Has gained a little weight. Has stopped smoking. Very rare palpitations. No chest pain. Travel to [place]. Uncle fought in World War II.  03/25/14-overall feeling fairly well. I've reviewed office notes from [name] who had last visit with him on  12/27/13. Event monitor at that time showed no evidence of atrial fibrillation. He will see him as needed going forward. Agrees with flecainide.  09/30/14 - felt lilke chest was locked up last night, like a spasm. Eased off quickly. Had this maybe years ago. Noted CPAP, apnea at times. Dr. [name]. Getting cataracts done.   Wt Readings from Last 3 Encounters:  09/29/14 238 lb 6.4 oz (108.138 kg)  08/24/14 235 lb (106.595 kg)  04/27/14 240 lb (108.863 kg)     Past Medical History  Diagnosis Date  . Atrial fib/flutter, transient   . Acid reflux   . Hypertension   . FHx: allergies   . Sleep apnea     After weight loss, off of CPAP  . 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  . Seasonal allergies   . Hemochromatosis   . Impaired fasting glucose   . Hypercholesteremia   . Depression   . Asthma with allergic rhinitis and status asthmaticus   . Gout     , Possible, Right Knee x2 ; intermittent milder episodes of pain in hte first MTP - uric acid 7.7- no treatment so far.  . Erectile dysfunction   . Trigger finger, left     , fourth finger  . Hypogonadism male   . ENT complaint     Dr. Erik Obey  . ASD (atrial septal defect)   . Chronic anticoagulation   . Hyperlipemia     Past Surgical History  Procedure Laterality Date  . Nasal sinus surgery  1997  . Liver biopsy    .  Colonoscopy w/ polypectomy      Current Outpatient Prescriptions  Medication Sig Dispense Refill  . allopurinol (ZYLOPRIM) 100 MG tablet Take 200 mg by mouth daily.     . cetirizine (ZYRTEC) 10 MG tablet Take 10 mg by mouth daily as needed for allergies.    . cholecalciferol (VITAMIN D) 1000 UNITS tablet Take 2,000 Units by mouth daily.     Marland Kitchen COLCRYS 0.6 MG tablet Take 0.6 mg by mouth 2 (two) times daily as needed.     . cyclopentolate (CYCLODRYL,CYCLOGYL) 1 % ophthalmic solution 1 DROP IN LEFT EYE THREE TIMES A DAY START 24 HOURS PRIOR TO  SURGERY  1  . diltiazem (CARDIZEM CD) 120 MG 24 hr capsule Take 1 capsule (120 mg total) by mouth daily. 90 capsule 3  . DUREZOL 0.05 % EMUL 1 DROP IN LEFT EYE THREE TIMES A DAY START AFTER SURGERY  1  . fexofenadine (ALLEGRA) 60 MG tablet Take 60 mg by mouth daily as needed. For allergies    . fish oil-omega-3 fatty acids 1000 MG capsule Take 2 g by mouth daily.    . flecainide (TAMBOCOR) 50 MG tablet TAKE 1 TABLET BY MOUTH TWICE A DAY 60 tablet 11  . fluticasone (FLONASE) 50 MCG/ACT nasal spray Place 1 spray into the nose daily.     . irbesartan (AVAPRO) 150 MG tablet Take 1 tablet (150 mg total) by mouth daily. 90 tablet 2  . ketorolac (ACULAR) 0.5 % ophthalmic solution 1 DROP IN LEFT EYE FOUR TIMES A DAY START 72 HOURS PRIOR TO SURGERY  1  . Misc Natural Products (LUTEIN 20) CAPS Take 40 mg by mouth daily with breakfast.     . NON FORMULARY CPAP    . ofloxacin (OCUFLOX) 0.3 % ophthalmic solution 1 DROP IN BOTH EYES FOUR TIMES A DAY START 72 HOURS PRIOR TO SURGERY  1  . ranitidine (ZANTAC) 300 MG tablet Take 300 mg by mouth every evening.     . Testosterone 10 MG/ACT (2%) GEL Apply 1 application topically daily.    Marland Kitchen warfarin (COUMADIN) 5 MG tablet TAKE AS DIRECTED BY ANTICOAGULATION CLINIC 65 tablet 3   No current facility-administered medications for this visit.    Allergies:    Allergies  Allergen Reactions  . Clindamycin/Lincomycin Diarrhea and Other (See Comments)    Stomach pain   . Sulfa Antibiotics Other (See Comments)    Headaches  . Amoxicillin Rash    Social History:  The patient  reports that he has been smoking.  He started smoking about 10 months ago. He does not have any smokeless tobacco history on file. He reports that he drinks alcohol. He reports that he does not use illicit drugs.   ROS:  Please see the history of present illness.   Denies any fevers, chills, orthopnea, PND    PHYSICAL EXAM: VS:  BP 140/66 mmHg  Ht 6\' 2"  (1.88 m)  Wt 238 lb 6.4 oz (108.138  kg)  BMI 30.60 kg/m2 Well nourished, well developed, in no acute distress HEENT: normal Neck: no JVD Cardiac:  normal S1, S2; bradycardic regular; no murmur Lungs:  clear to auscultation bilaterally, no wheezing, rhonchi or rales Abd: soft, nontender, no hepatomegalyOverweight Ext: Mild right lower extremity edema Skin: warm and dry Neuro: no focal abnormalities noted  EKG: 03/25/14-sinus bradycardia rate 47, no other abnormalities. PR interval 136 ms, QTC 394 ms.  Event monitor: 11/2013-no evidence of atrial fibrillation.    ASSESSMENT AND PLAN:  1.  Atrial fibrillation, paroxysmal-doing well, continue antiarrhythmic. No changes made. Appreciate assistance from [name]. Office notes reviewed. Has had atrial flutter as well. Continuing with low-dose flecainide, low-dose diltiazem. Heart rate is bradycardic. At times, he may feel lightheaded or symptomatic, one time he felt this after working out. After he hydrated and ate breakfast he felt better. Continue to watch this. Prior monitor showed no evidence of dangerous bradycardia. He is strongly considering change over to Eliquis. I think this would be good for him. 2. Hypertension- Overall doing well. Previously minimally elevated 3. Weight gain continue to monitor. Working with Clinical research associate. Decrease carbohydrates 4. Fatigue-likely secondary to AV nodal blocking agents. Continue with exercise. 5. Excellent Job at tobacco cessation. 6. Right lower extremity mild edema-long plane flight from [place], 8 hours. It would be very bizarre for him to develop thrombosis in the setting of Coumadin however I will check right lower extremity venous ultrasound to make sure that there is no evidence. 7. Obstructive sleep apnea-continue to follow with Dr. [name]. 8. Six-month follow-up  Signed, Candee Furbish, MD [facility]  09/29/2014 11:16 AM

## 2014-09-29 NOTE — Patient Instructions (Addendum)
Medication Instructions:  Your physician recommends that you continue on your current medications as directed. Please refer to the Current Medication list given to you today.  Testing/Procedures: Your physician has requested that you have a lower venous duplex. This test is an ultrasound of the veins in the legs. It looks at venous blood flow that carries blood from the heart to the legs. Allow one hour for a Lower Venous exam.  There are no restrictions or special instructions.  Follow-Up: Follow up in 6 months with Dr. Marlou Porch.  You will receive a letter in the mail 2 months before you are due.  Please call us when you receive this letter to schedule your follow up appointment.  Thank you for choosing [facility]!!

## 2014-10-06 ENCOUNTER — Ambulatory Visit (HOSPITAL_COMMUNITY): Payer: PPO | Attending: Internal Medicine

## 2014-10-06 DIAGNOSIS — M7989 Other specified soft tissue disorders: Secondary | ICD-10-CM | POA: Diagnosis not present

## 2014-10-19 ENCOUNTER — Encounter (HOSPITAL_COMMUNITY)
Admission: RE | Admit: 2014-10-19 | Discharge: 2014-10-19 | Disposition: A | Discharge status: Home / Self Care | Payer: PPO | Source: Ambulatory Visit | Attending: Internal Medicine | Admitting: Internal Medicine

## 2014-10-19 ENCOUNTER — Other Ambulatory Visit: Payer: Self-pay | Admitting: Cardiology

## 2014-10-19 LAB — POCT HEMOGLOBIN-HEMACUE: HEMOGLOBIN: 13.4 g/dL (ref 13.0–17.0)

## 2014-10-19 NOTE — Progress Notes (Signed)
Pt came in today for scheduled therapeutic phlebotomy.  Pt's HemoCue was checked prior to procedure it was 13.7.  Phlebotomy was done per MD order and per hospital protocol.  Right AC was used.  500 cc of blood was removed.  Pt tolerated procedure well.  Will continue to monitor closely

## 2014-10-27 ENCOUNTER — Ambulatory Visit (INDEPENDENT_AMBULATORY_CARE_PROVIDER_SITE_OTHER): Payer: PPO | Admitting: *Deleted

## 2014-10-27 DIAGNOSIS — I4891 Unspecified atrial fibrillation: Secondary | ICD-10-CM

## 2014-10-27 LAB — POCT INR: INR: 3.2

## 2014-11-02 ENCOUNTER — Other Ambulatory Visit: Payer: Self-pay | Admitting: Gastroenterology

## 2014-11-02 DIAGNOSIS — R109 Unspecified abdominal pain: Secondary | ICD-10-CM

## 2014-11-04 ENCOUNTER — Ambulatory Visit
Admission: RE | Admit: 2014-11-04 | Discharge: 2014-11-04 | Disposition: A | Discharge status: Home / Self Care | Payer: PPO | Source: Ambulatory Visit | Attending: Gastroenterology | Admitting: Gastroenterology

## 2014-11-04 DIAGNOSIS — R109 Unspecified abdominal pain: Secondary | ICD-10-CM

## 2014-11-24 ENCOUNTER — Ambulatory Visit (INDEPENDENT_AMBULATORY_CARE_PROVIDER_SITE_OTHER): Payer: PPO | Admitting: *Deleted

## 2014-11-24 DIAGNOSIS — I4891 Unspecified atrial fibrillation: Secondary | ICD-10-CM

## 2014-11-24 DIAGNOSIS — Z5181 Encounter for therapeutic drug level monitoring: Secondary | ICD-10-CM | POA: Diagnosis not present

## 2014-11-24 LAB — POCT INR: INR: 3.1

## 2014-12-08 ENCOUNTER — Ambulatory Visit (INDEPENDENT_AMBULATORY_CARE_PROVIDER_SITE_OTHER): Payer: PPO | Admitting: *Deleted

## 2014-12-08 DIAGNOSIS — I4891 Unspecified atrial fibrillation: Secondary | ICD-10-CM

## 2014-12-08 LAB — POCT INR: INR: 2.7

## 2014-12-18 ENCOUNTER — Other Ambulatory Visit: Payer: Self-pay | Admitting: Cardiology

## 2014-12-29 ENCOUNTER — Other Ambulatory Visit: Payer: Self-pay | Admitting: Internal Medicine

## 2014-12-29 ENCOUNTER — Ambulatory Visit (INDEPENDENT_AMBULATORY_CARE_PROVIDER_SITE_OTHER): Payer: PPO | Admitting: Pharmacist

## 2014-12-29 DIAGNOSIS — I4891 Unspecified atrial fibrillation: Secondary | ICD-10-CM | POA: Diagnosis not present

## 2014-12-29 LAB — POCT INR: INR: 2.5

## 2015-01-18 ENCOUNTER — Other Ambulatory Visit: Payer: Self-pay | Admitting: Cardiology

## 2015-01-19 ENCOUNTER — Other Ambulatory Visit (HOSPITAL_COMMUNITY): Payer: Self-pay | Admitting: *Deleted

## 2015-01-20 ENCOUNTER — Encounter (HOSPITAL_COMMUNITY)
Admission: RE | Admit: 2015-01-20 | Discharge: 2015-01-20 | Disposition: A | Discharge status: Home / Self Care | Payer: PPO | Source: Ambulatory Visit | Attending: Internal Medicine | Admitting: Internal Medicine

## 2015-01-20 LAB — POCT HEMOGLOBIN-HEMACUE: Hemoglobin: 13.6 g/dL (ref 13.0–17.0)

## 2015-01-26 ENCOUNTER — Ambulatory Visit (INDEPENDENT_AMBULATORY_CARE_PROVIDER_SITE_OTHER): Payer: PPO | Admitting: *Deleted

## 2015-01-26 DIAGNOSIS — I4891 Unspecified atrial fibrillation: Secondary | ICD-10-CM

## 2015-01-26 LAB — POCT INR: INR: 2.7

## 2015-02-15 ENCOUNTER — Other Ambulatory Visit: Payer: Self-pay | Admitting: Cardiology

## 2015-02-23 ENCOUNTER — Ambulatory Visit (INDEPENDENT_AMBULATORY_CARE_PROVIDER_SITE_OTHER): Payer: PPO

## 2015-02-23 DIAGNOSIS — I4891 Unspecified atrial fibrillation: Secondary | ICD-10-CM

## 2015-02-23 LAB — POCT INR: INR: 2.5

## 2015-03-08 ENCOUNTER — Other Ambulatory Visit: Payer: Self-pay

## 2015-03-08 MED ORDER — FLECAINIDE ACETATE 50 MG PO TABS: 50.0000 mg | ORAL_TABLET | Freq: Two times a day (BID) | ORAL | Status: DC

## 2015-03-08 NOTE — Telephone Encounter (Signed)
Jerline Pain, MD at 09/29/2014 10:33 AM  flecainide (TAMBOCOR) 50 MG tabletTAKE 1 TABLET BY MOUTH TWICE A DAY ASSESSMENT AND PLAN:  1. Atrial fibrillation, paroxysmal-doing well, continue antiarrhythmic. No changes made. Appreciate assistance from [name]. Office notes reviewed. Has had atrial flutter as well. Continuing with low-dose flecainide, low-dose diltiazem. Heart rate is bradycardic. At times, he may feel lightheaded or symptomatic, one time he felt this after working out. After he hydrated and ate breakfast he felt better. Continue to watch this. Prior monitor showed no evidence of dangerous bradycardia. He is strongly considering change over to Eliquis. I think this would be good for him.

## 2015-03-24 ENCOUNTER — Other Ambulatory Visit: Payer: Self-pay | Admitting: Cardiology

## 2015-03-29 ENCOUNTER — Encounter: Payer: Self-pay | Admitting: Cardiology

## 2015-03-29 ENCOUNTER — Ambulatory Visit (INDEPENDENT_AMBULATORY_CARE_PROVIDER_SITE_OTHER): Payer: PPO | Admitting: Cardiology

## 2015-03-29 VITALS — BP 124/72 | HR 52 | Ht 74.0 in | Wt 247.0 lb

## 2015-03-29 DIAGNOSIS — I480 Paroxysmal atrial fibrillation: Secondary | ICD-10-CM

## 2015-03-29 DIAGNOSIS — Z7901 Long term (current) use of anticoagulants: Secondary | ICD-10-CM | POA: Diagnosis not present

## 2015-03-29 DIAGNOSIS — G4733 Obstructive sleep apnea (adult) (pediatric): Secondary | ICD-10-CM | POA: Diagnosis not present

## 2015-03-29 DIAGNOSIS — Z720 Tobacco use: Secondary | ICD-10-CM

## 2015-03-29 DIAGNOSIS — E669 Obesity, unspecified: Secondary | ICD-10-CM

## 2015-03-29 MED ORDER — FLECAINIDE ACETATE 50 MG PO TABS: 50.0000 mg | ORAL_TABLET | Freq: Two times a day (BID) | ORAL | Status: DC

## 2015-03-29 NOTE — Patient Instructions (Signed)
Medication Instructions:  Your physician recommends that you continue on your current medications as directed. Please refer to the Current Medication list given to you today.   Labwork: none  Testing/Procedures: none  Follow-Up: Your physician wants you to follow-up in: 6 months with Dr. Skains You will receive a reminder letter in the mail two months in advance. If you don't receive a letter, please call our office to schedule the follow-up appointment.   Any Other Special Instructions Will Be Listed Below (If Applicable).     If you need a refill on your cardiac medications before your next appointment, please call your pharmacy.   

## 2015-03-29 NOTE — Progress Notes (Signed)
[facility]. [address], [address] [place], [place]  [address] Phone: [phone] Fax:  [phone]  Date:  03/29/2015   ID:  Jonathan Vargas, DOB [birth year], MRN [id number]  PCP:  Lottie Dawson, MD  EP: [name]  History of Present Illness: Jonathan Vargas is a 70 y.o. male with atrial fibrillation/flutter paroxysmal on antiarrhythmic, warfarin, diltiazem, anticoagulation here for followup.    Had stress test 09/01/12 that demonstrated a mild inferior wall defect, possible diaphragmatic attenuation, overall low risk study with normal ejection fraction. Hemachromatosis.   Previous hospitalization in November 2012 demonstrated paroxysmal atrial flutter which converted after 4 hours of diltiazem. He had a postconversion pause of 8.2 seconds, felt flushed.   In early June of 2014, he began to feel some chest discomfort while walking, exertional down both arms that was relieved with rest. Noted when on treadmill when warming up. Had one experiecne when walking up stairs in a parking lot after a dinner show. Had 1/2 bottle of wine. This prompted stress test. No evidence of arrhythmia on treadmill. Worked out since then. Fleeting 4-5 min down both arms. Hypertension is well controlled.   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 Smoking cessation counseling.  Previously he has had a few palpitations, like heart stops for a split second and one second of pain then goes away. he had a good week this week but previously has felt occasional fluttering. At one point he felt his carotid and he felt as though he was in atrial fibrillation and he coughed and he" went out". No syncope, no dizziness.  03/25/14-overall feeling fairly well. I've reviewed office notes from [name] who had last visit with him on 12/27/13. Event monitor at that time showed no evidence of atrial fibrillation. He will see him as needed going forward. Agrees with flecainide.  09/30/14 - felt lilke  chest was locked up last night, like a spasm. Eased off quickly. Had this maybe years ago. Noted CPAP, apnea at times. Dr. [name]. Getting cataracts done.   03/29/15-overall doing well. No recent hospitalizations. Atrial fibrillation under good control. Anticoagulation Works out with Clinical research associate. He has not noticed any significant palpitations. No recent episodes of A. fib. No chest pain, no fevers, no chills, no orthopnea, no PND, no syncope    Wt Readings from Last 3 Encounters:  03/29/15 247 lb (112.038 kg)  10/19/14 235 lb (106.595 kg)  09/29/14 238 lb 6.4 oz (108.138 kg)     Past Medical History  Diagnosis Date  . Atrial fib/flutter, transient   . Acid reflux   . Hypertension   . FHx: allergies   . Sleep apnea     After weight loss, off of CPAP  . 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  . Seasonal allergies   . Hemochromatosis   . Impaired fasting glucose   . Hypercholesteremia   . Depression   . Asthma with allergic rhinitis and status asthmaticus   . Gout     , Possible, Right Knee x2 ; intermittent milder episodes of pain in hte first MTP - uric acid 7.7- no treatment so far.  . Erectile dysfunction   . Trigger finger, left     , fourth finger  . Hypogonadism male   . ENT complaint     Dr. Erik Obey  . ASD (atrial septal defect)   . Chronic anticoagulation   . Hyperlipemia  Past Surgical History  Procedure Laterality Date  . Nasal sinus surgery  1997  . Liver biopsy    . Colonoscopy w/ polypectomy      Current Outpatient Prescriptions  Medication Sig Dispense Refill  . allopurinol (ZYLOPRIM) 100 MG tablet Take 200 mg by mouth daily.     . cetirizine (ZYRTEC) 10 MG tablet Take 10 mg by mouth daily as needed for allergies.    . cholecalciferol (VITAMIN D) 1000 UNITS tablet Take 2,000 Units by mouth daily.     Marland Kitchen COLCRYS 0.6 MG tablet Take 0.6 mg by mouth 2 (two) times daily as needed.       . diltiazem (CARDIZEM CD) 120 MG 24 hr capsule TAKE 1 CAPSULE (120 MG TOTAL) BY MOUTH DAILY. 90 capsule 1  . fexofenadine (ALLEGRA) 60 MG tablet Take 60 mg by mouth daily as needed. For allergies    . fish oil-omega-3 fatty acids 1000 MG capsule Take 2 g by mouth daily.    . flecainide (TAMBOCOR) 50 MG tablet Take 1 tablet (50 mg total) by mouth 2 (two) times daily. 180 tablet 0  . fluticasone (FLONASE) 50 MCG/ACT nasal spray Place 1 spray into the nose daily.     . irbesartan (AVAPRO) 150 MG tablet TAKE 1 TABLET BY MOUTH EVERY DAY 90 tablet 0  . Misc Natural Products (LUTEIN 20) CAPS Take 40 mg by mouth daily with breakfast.     . NON FORMULARY CPAP    . PATADAY 0.2 % SOLN PLACE 1 DROP INTO BOTH EYES EVERY DAY  4  . ranitidine (ZANTAC) 300 MG tablet Take 300 mg by mouth every evening.     . Testosterone 10 MG/ACT (2%) GEL Apply 1 application topically daily.    Marland Kitchen warfarin (COUMADIN) 5 MG tablet TAKE AS DIRECTED BY ANTICOAGULATION CLINIC 65 tablet 3   No current facility-administered medications for this visit.    Allergies:    Allergies  Allergen Reactions  . Clindamycin/Lincomycin Diarrhea and Other (See Comments)    Stomach pain   . Sulfa Antibiotics Other (See Comments)    Headaches  . Amoxicillin Rash    Social History:  The patient  reports that he has been smoking.  He started smoking about 16 months ago. He does not have any smokeless tobacco history on file. He reports that he drinks alcohol. He reports that he does not use illicit drugs.   ROS:  Please see the history of present illness.   Denies any fevers, chills, orthopnea, PND    PHYSICAL EXAM: VS:  BP 124/72 mmHg  Pulse 52  Ht 6\' 2"  (1.88 m)  Wt 247 lb (112.038 kg)  BMI 31.70 kg/m2 Well nourished, well developed, in no acute distress HEENT: normal Neck: no JVD Cardiac:  normal S1, S2; bradycardic regular; no murmur Lungs:  clear to auscultation bilaterally, no wheezing, rhonchi or rales Abd: soft, nontender,  no hepatomegalyOverweight Ext: Mild right lower extremity edema Skin: warm and dry Neuro: no focal abnormalities noted  EKG: today 03/29/15-sinus bradycardia rate 54 with left axis deviation, no other significant abnormalities. Personally viewed-prior 03/25/14-sinus bradycardia rate 47, no other abnormalities. PR interval 136 ms, QTC 394 ms.  Event monitor: 11/2013-no evidence of atrial fibrillation.    ASSESSMENT AND PLAN:  1. Atrial fibrillation, paroxysmal-doing well, continue antiarrhythmic, low-dose flecainide. No recent episodes. No changes made. Appreciate assistance from [name]. Office notes reviewed. Has had atrial flutter as well. Continuing with low-dose flecainide, low-dose diltiazem. Heart rate is bradycardic.  At times, he may feel lightheaded or symptomatic, one time he felt this after working out. After he hydrated and ate breakfast he felt better. Continue to watch this. Prior monitor showed no evidence of dangerous bradycardia. Not interested in Eliquis or Xarelto at this time 2. Hypertension- Overall doing well. Previously minimally elevated 3. Weight gain continue to monitor. Working with Clinical research associate. Decrease carbohydrates 4. Fatigue-improved Continue with exercise. 5. Tobacco cessation.=5-7 day, strongly encourage cessation. High risk 6. Right lower extremity mild edema-prior lower extremity ultrasound on 10/06/14 in the setting of recent long plane flight was normal. Resolved 7. Obstructive sleep apnea-continue to follow with Dr. [name]. 8. Six-month follow-up  Signed, Candee Furbish, MD [facility]  03/29/2015 11:34 AM

## 2015-04-06 ENCOUNTER — Ambulatory Visit (INDEPENDENT_AMBULATORY_CARE_PROVIDER_SITE_OTHER): Payer: PPO | Admitting: *Deleted

## 2015-04-06 DIAGNOSIS — I4891 Unspecified atrial fibrillation: Secondary | ICD-10-CM

## 2015-04-06 LAB — POCT INR: INR: 2.4

## 2015-04-13 ENCOUNTER — Other Ambulatory Visit: Payer: Self-pay | Admitting: *Deleted

## 2015-04-13 ENCOUNTER — Other Ambulatory Visit: Payer: Self-pay | Admitting: Cardiology

## 2015-04-13 MED ORDER — WARFARIN SODIUM 5 MG PO TABS: ORAL_TABLET | ORAL | Status: DC

## 2015-04-13 NOTE — Telephone Encounter (Signed)
Request for a refill on Warfarin 5 mg tablet

## 2015-04-13 NOTE — Telephone Encounter (Signed)
Refill request complete, refer to Warfarin/Coumadin refill encounter

## 2015-04-18 ENCOUNTER — Other Ambulatory Visit (HOSPITAL_COMMUNITY): Payer: Self-pay | Admitting: *Deleted

## 2015-04-19 ENCOUNTER — Encounter (HOSPITAL_COMMUNITY)
Admission: RE | Admit: 2015-04-19 | Discharge: 2015-04-19 | Disposition: A | Discharge status: Home / Self Care | Payer: PPO | Source: Ambulatory Visit | Attending: Internal Medicine | Admitting: Internal Medicine

## 2015-04-19 LAB — POCT HEMOGLOBIN-HEMACUE: Hemoglobin: 14 g/dL (ref 13.0–17.0)

## 2015-04-19 MED ORDER — LIDOCAINE HCL 2 % IJ SOLN: 0.1000 mL | Freq: Once | INTRAMUSCULAR | Status: DC

## 2015-05-04 DIAGNOSIS — H353113 Nonexudative age-related macular degeneration, right eye, advanced atrophic without subfoveal involvement: Secondary | ICD-10-CM | POA: Diagnosis not present

## 2015-05-04 DIAGNOSIS — H43813 Vitreous degeneration, bilateral: Secondary | ICD-10-CM | POA: Diagnosis not present

## 2015-05-04 DIAGNOSIS — H35031 Hypertensive retinopathy, right eye: Secondary | ICD-10-CM | POA: Diagnosis not present

## 2015-05-04 DIAGNOSIS — H40013 Open angle with borderline findings, low risk, bilateral: Secondary | ICD-10-CM | POA: Diagnosis not present

## 2015-05-04 DIAGNOSIS — H35032 Hypertensive retinopathy, left eye: Secondary | ICD-10-CM | POA: Diagnosis not present

## 2015-05-16 DIAGNOSIS — J329 Chronic sinusitis, unspecified: Secondary | ICD-10-CM | POA: Diagnosis not present

## 2015-05-18 ENCOUNTER — Ambulatory Visit (INDEPENDENT_AMBULATORY_CARE_PROVIDER_SITE_OTHER): Payer: PPO | Admitting: Pharmacist

## 2015-05-18 DIAGNOSIS — I4891 Unspecified atrial fibrillation: Secondary | ICD-10-CM

## 2015-05-18 LAB — POCT INR: INR: 2.2

## 2015-06-05 ENCOUNTER — Ambulatory Visit
Admission: RE | Admit: 2015-06-05 | Discharge: 2015-06-05 | Disposition: A | Discharge status: Home / Self Care | Payer: PPO | Source: Ambulatory Visit | Attending: Internal Medicine | Admitting: Internal Medicine

## 2015-06-05 ENCOUNTER — Other Ambulatory Visit: Payer: Self-pay | Admitting: Internal Medicine

## 2015-06-05 ENCOUNTER — Other Ambulatory Visit: Payer: Self-pay | Admitting: Cardiology

## 2015-06-05 DIAGNOSIS — J4520 Mild intermittent asthma, uncomplicated: Secondary | ICD-10-CM | POA: Diagnosis not present

## 2015-06-05 DIAGNOSIS — R05 Cough: Secondary | ICD-10-CM | POA: Diagnosis not present

## 2015-06-05 DIAGNOSIS — R053 Chronic cough: Secondary | ICD-10-CM

## 2015-06-05 DIAGNOSIS — I4891 Unspecified atrial fibrillation: Secondary | ICD-10-CM | POA: Diagnosis not present

## 2015-06-05 DIAGNOSIS — E291 Testicular hypofunction: Secondary | ICD-10-CM | POA: Diagnosis not present

## 2015-06-05 DIAGNOSIS — I10 Essential (primary) hypertension: Secondary | ICD-10-CM | POA: Diagnosis not present

## 2015-06-19 ENCOUNTER — Other Ambulatory Visit: Payer: Self-pay | Admitting: Cardiology

## 2015-06-29 ENCOUNTER — Ambulatory Visit (INDEPENDENT_AMBULATORY_CARE_PROVIDER_SITE_OTHER): Payer: PPO | Admitting: Pharmacist

## 2015-06-29 DIAGNOSIS — M1A09X0 Idiopathic chronic gout, multiple sites, without tophus (tophi): Secondary | ICD-10-CM | POA: Diagnosis not present

## 2015-06-29 DIAGNOSIS — M150 Primary generalized (osteo)arthritis: Secondary | ICD-10-CM | POA: Diagnosis not present

## 2015-06-29 DIAGNOSIS — Z79899 Other long term (current) drug therapy: Secondary | ICD-10-CM | POA: Diagnosis not present

## 2015-06-29 DIAGNOSIS — I4891 Unspecified atrial fibrillation: Secondary | ICD-10-CM

## 2015-06-29 LAB — POCT INR: INR: 2.4

## 2015-07-17 ENCOUNTER — Other Ambulatory Visit (HOSPITAL_COMMUNITY): Payer: Self-pay | Admitting: *Deleted

## 2015-07-18 ENCOUNTER — Encounter (HOSPITAL_COMMUNITY)
Admission: RE | Admit: 2015-07-18 | Discharge: 2015-07-18 | Disposition: A | Discharge status: Home / Self Care | Payer: PPO | Source: Ambulatory Visit | Attending: Internal Medicine | Admitting: Internal Medicine

## 2015-07-18 MED ORDER — LIDOCAINE HCL 2 % IJ SOLN: 0.1000 mL | Freq: Once | INTRAMUSCULAR | Status: DC

## 2015-07-18 NOTE — Progress Notes (Signed)
Pt came in today for scheduled therapeutic phlebotomy.  Pt's HemoCue prior to procedure was 14.2  Right Ac was used.  500 cc was removed per MD order.  Pt tolerated procedure well.  Will continue to monitor

## 2015-07-19 LAB — POCT HEMOGLOBIN-HEMACUE: Hemoglobin: 14.2 g/dL (ref 13.0–17.0)

## 2015-07-21 ENCOUNTER — Other Ambulatory Visit: Payer: Self-pay | Admitting: Cardiology

## 2015-07-27 DIAGNOSIS — M1A09X0 Idiopathic chronic gout, multiple sites, without tophus (tophi): Secondary | ICD-10-CM | POA: Diagnosis not present

## 2015-08-08 ENCOUNTER — Ambulatory Visit (INDEPENDENT_AMBULATORY_CARE_PROVIDER_SITE_OTHER): Payer: PPO | Admitting: Pharmacist

## 2015-08-08 DIAGNOSIS — I4891 Unspecified atrial fibrillation: Secondary | ICD-10-CM

## 2015-08-08 LAB — POCT INR: INR: 2.3

## 2015-08-22 DIAGNOSIS — M25511 Pain in right shoulder: Secondary | ICD-10-CM | POA: Diagnosis not present

## 2015-09-25 ENCOUNTER — Encounter (INDEPENDENT_AMBULATORY_CARE_PROVIDER_SITE_OTHER): Payer: Self-pay

## 2015-09-25 ENCOUNTER — Encounter: Payer: Self-pay | Admitting: Cardiology

## 2015-09-25 ENCOUNTER — Ambulatory Visit (INDEPENDENT_AMBULATORY_CARE_PROVIDER_SITE_OTHER): Payer: PPO | Admitting: Cardiology

## 2015-09-25 ENCOUNTER — Ambulatory Visit (INDEPENDENT_AMBULATORY_CARE_PROVIDER_SITE_OTHER): Payer: PPO | Admitting: Pharmacist

## 2015-09-25 VITALS — BP 118/70 | HR 48 | Ht 74.0 in | Wt 238.8 lb

## 2015-09-25 DIAGNOSIS — Z720 Tobacco use: Secondary | ICD-10-CM | POA: Diagnosis not present

## 2015-09-25 DIAGNOSIS — E669 Obesity, unspecified: Secondary | ICD-10-CM

## 2015-09-25 DIAGNOSIS — N400 Enlarged prostate without lower urinary tract symptoms: Secondary | ICD-10-CM | POA: Diagnosis not present

## 2015-09-25 DIAGNOSIS — N5201 Erectile dysfunction due to arterial insufficiency: Secondary | ICD-10-CM | POA: Diagnosis not present

## 2015-09-25 DIAGNOSIS — I480 Paroxysmal atrial fibrillation: Secondary | ICD-10-CM

## 2015-09-25 DIAGNOSIS — R001 Bradycardia, unspecified: Secondary | ICD-10-CM | POA: Diagnosis not present

## 2015-09-25 DIAGNOSIS — I4891 Unspecified atrial fibrillation: Secondary | ICD-10-CM | POA: Diagnosis not present

## 2015-09-25 LAB — POCT INR: INR: 2.4

## 2015-09-25 MED ORDER — FLECAINIDE ACETATE 50 MG PO TABS: 50.0000 mg | ORAL_TABLET | Freq: Two times a day (BID) | ORAL | Status: DC

## 2015-09-25 NOTE — Progress Notes (Signed)
[name]. [address] [place], [place]  [address] Phone: [phone] Fax:  [phone]  Date:  09/25/2015   ID:  Jonathan Vargas, DOB [birth year], MRN [id number]  PCP:  [name], MD  EP: [name]  History of Present Illness: Jonathan Vargas is a 70 y.o. male with atrial fibrillation/flutter paroxysmal on antiarrhythmic, warfarin, diltiazem, anticoagulation here for followup.    Had stress test 09/01/12 that demonstrated a mild inferior wall defect, possible diaphragmatic attenuation, overall low risk study with normal ejection fraction. Hemachromatosis.   Previous hospitalization in November 2012 demonstrated paroxysmal atrial flutter which converted after 4 hours of diltiazem. He had a postconversion pause of 8.2 seconds, felt flushed.   In early June of 2014, he began to feel some chest discomfort while walking, exertional down both arms that was relieved with rest. Noted when on treadmill when warming up. Had one experiecne when walking up stairs in a parking lot after a dinner show. Had 1/2 bottle of wine. This prompted stress test. No evidence of arrhythmia on treadmill. Worked out since then. Fleeting 4-5 min down both arms. Hypertension is well controlled.   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 Smoking cessation counseling.  Previously he has had a few palpitations, like heart stops for a split second and one second of pain then goes away. he had a good week this week but previously has felt occasional fluttering. At one point he felt his carotid and he felt as though he was in atrial fibrillation and he coughed and he" went out". No syncope, no dizziness.  03/25/14-overall feeling fairly well. I've reviewed office notes from [name] who had last visit with him on 12/27/13. Event monitor at that time showed no evidence of atrial fibrillation. He will see him as needed going forward. Agrees with flecainide.  09/30/14 - felt lilke chest  was locked up last night, like a spasm. Eased off quickly. Had this maybe years ago. Noted CPAP, apnea at times. Dr. [name]. Getting cataracts done.   03/29/15-overall doing well. No recent hospitalizations. Atrial fibrillation under good control. Anticoagulation Works out with Clinical research associate. He has not noticed any significant palpitations. No recent episodes of A. fib. No chest pain, no fevers, no chills, no orthopnea, no PND, no syncope  09/25/15-working out with personal trainer. Occasionally he will feel skipping heartbeat. Rarely tachycardic. Most the time he has a sensation of a PAC every fifth or sixth beat at times. He notices compensatory pause. No syncope. No chest pain. Rare sensation below left breast.    Wt Readings from Last 3 Encounters:  09/25/15 238 lb 12.8 oz (108.319 kg)  04/19/15 240 lb (108.863 kg)  03/29/15 247 lb (112.038 kg)     Past Medical History  Diagnosis Date  . Atrial fib/flutter, transient   . Acid reflux   . Hypertension   . FHx: allergies   . Sleep apnea     After weight loss, off of CPAP  . 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  . Seasonal allergies   . Hemochromatosis   . Impaired fasting glucose   . Hypercholesteremia   . Depression   . Asthma with allergic rhinitis and status asthmaticus   . Gout     , Possible, Right Knee x2 ; intermittent milder episodes of pain in hte first MTP - uric acid 7.7- no treatment so far.  . Erectile dysfunction   .  Trigger finger, left     , fourth finger  . Hypogonadism male   . ENT complaint     Dr. Erik Obey  . ASD (atrial septal defect)   . Chronic anticoagulation   . Hyperlipemia     Past Surgical History  Procedure Laterality Date  . Nasal sinus surgery  1997  . Liver biopsy    . Colonoscopy w/ polypectomy      Current Outpatient Prescriptions  Medication Sig Dispense Refill  . allopurinol (ZYLOPRIM) 300 MG tablet Take 300 mg  by mouth daily.  1  . CALCIUM PO Take 200 mg by mouth 2 (two) times daily.    . cetirizine (ZYRTEC) 10 MG tablet Take 10 mg by mouth daily as needed for allergies.    . cholecalciferol (VITAMIN D) 1000 units tablet Take 1,000 Units by mouth daily.    Marland Kitchen COLCRYS 0.6 MG tablet Take 0.6 mg by mouth 2 (two) times daily as needed (for gout).     Marland Kitchen diclofenac sodium (VOLTAREN) 1 % GEL Apply 1 application topically 4 (four) times daily as needed (for pain).   3  . diltiazem (CARDIZEM CD) 120 MG 24 hr capsule TAKE 1 CAPSULE (120 MG TOTAL) BY MOUTH DAILY. 90 capsule 2  . fexofenadine (ALLEGRA) 60 MG tablet Take 60 mg by mouth daily as needed. For allergies    . flecainide (TAMBOCOR) 50 MG tablet Take 1 tablet (50 mg total) by mouth 2 (two) times daily. 180 tablet 3  . fluticasone (FLONASE) 50 MCG/ACT nasal spray Place 1 spray into the nose daily.     . irbesartan (AVAPRO) 150 MG tablet TAKE 1 TABLET BY MOUTH EVERY DAY 90 tablet 2  . Misc Natural Products (LUTEIN 20) CAPS Take 40 mg by mouth 2 (two) times daily.     . NON FORMULARY CPAP    . PATADAY 0.2 % SOLN 1 drop into both eyes as needed for allergies  4  . PROAIR RESPICLICK 123XX123 (90 Base) MCG/ACT AEPB Inhale 1 puff into the lungs daily as needed. Shortness of breath  1  . ranitidine (ZANTAC) 300 MG tablet Take 300 mg by mouth every evening.     . warfarin (COUMADIN) 5 MG tablet TAKE AS DIRECTED BY ANTICOAGULATION CLINIC 65 tablet 3   No current facility-administered medications for this visit.    Allergies:    Allergies  Allergen Reactions  . Clindamycin/Lincomycin Diarrhea and Other (See Comments)    Stomach pain   . Amoxicillin Rash    Social History:  The patient  reports that he has been smoking.  He started smoking about 22 months ago. He does not have any smokeless tobacco history on file. He reports that he drinks alcohol. He reports that he does not use illicit drugs.   ROS:  Please see the history of present illness.   Denies any  fevers, chills, orthopnea, PND    PHYSICAL EXAM: VS:  BP 118/70 mmHg  Pulse 48  Ht 6\' 2"  (1.88 m)  Wt 238 lb 12.8 oz (108.319 kg)  BMI 30.65 kg/m2 Well nourished, well developed, in no acute distress HEENT: normal Neck: no JVD Cardiac:  normal S1, S2; bradycardic regular; no murmur Lungs:  clear to auscultation bilaterally, no wheezing, rhonchi or rales Abd: soft, nontender, no hepatomegalyOverweight Ext: Mild right lower extremity edema Skin: warm and dry Neuro: no focal abnormalities noted  EKG:  03/29/15-sinus bradycardia rate 54 with left axis deviation, no other significant abnormalities. Personally viewed-prior 03/25/14-sinus bradycardia  rate 47, no other abnormalities. PR interval 136 ms, QTC 394 ms.  Event monitor: 11/2013-no evidence of atrial fibrillation.    ASSESSMENT AND PLAN:  1. Atrial fibrillation, paroxysmal-doing well, continue antiarrhythmic, low-dose flecainide. No recent episodes. No changes made. Appreciate assistance from [name]. Office notes reviewed. Has had atrial flutter as well. Continuing with low-dose flecainide, low-dose diltiazem. Heart rate is bradycardic. At times, he may feel lightheaded or symptomatic, one time he felt this after working out. After he hydrated and ate breakfast he felt better. Continue to watch this. Prior monitor showed no evidence of dangerous bradycardia. Not interested in Eliquis or Xarelto at this time. Discussed the potential need for pacemaker at times. 2. Hypertension- Overall doing well. Previously minimally elevated 3. Obesity- Working with Clinical research associate. Decrease carbohydrates. 10 pounds down 4. Fatigue-improved Continue with exercise. He has a Physiological scientist. 5. Tobacco cessation.=5-7 day, strongly encourage cessation. High risk 6. Obstructive sleep apnea-continue to follow with Dr. [name]. 7. Six-month follow-up  Signed, Candee Furbish, MD [facility]  09/25/2015 9:35 AM

## 2015-09-25 NOTE — Patient Instructions (Signed)

## 2015-09-27 ENCOUNTER — Other Ambulatory Visit: Payer: Self-pay | Admitting: Cardiology

## 2015-10-17 DIAGNOSIS — R42 Dizziness and giddiness: Secondary | ICD-10-CM | POA: Diagnosis not present

## 2015-10-17 DIAGNOSIS — G4733 Obstructive sleep apnea (adult) (pediatric): Secondary | ICD-10-CM | POA: Diagnosis not present

## 2015-10-17 DIAGNOSIS — I4891 Unspecified atrial fibrillation: Secondary | ICD-10-CM | POA: Diagnosis not present

## 2015-10-17 DIAGNOSIS — M109 Gout, unspecified: Secondary | ICD-10-CM | POA: Diagnosis not present

## 2015-10-17 DIAGNOSIS — R5383 Other fatigue: Secondary | ICD-10-CM | POA: Diagnosis not present

## 2015-10-17 DIAGNOSIS — K219 Gastro-esophageal reflux disease without esophagitis: Secondary | ICD-10-CM | POA: Diagnosis not present

## 2015-10-17 DIAGNOSIS — E291 Testicular hypofunction: Secondary | ICD-10-CM | POA: Diagnosis not present

## 2015-10-17 DIAGNOSIS — I10 Essential (primary) hypertension: Secondary | ICD-10-CM | POA: Diagnosis not present

## 2015-11-02 DIAGNOSIS — L820 Inflamed seborrheic keratosis: Secondary | ICD-10-CM | POA: Diagnosis not present

## 2015-11-02 DIAGNOSIS — L821 Other seborrheic keratosis: Secondary | ICD-10-CM | POA: Diagnosis not present

## 2015-11-02 DIAGNOSIS — D225 Melanocytic nevi of trunk: Secondary | ICD-10-CM | POA: Diagnosis not present

## 2015-11-02 DIAGNOSIS — Z85828 Personal history of other malignant neoplasm of skin: Secondary | ICD-10-CM | POA: Diagnosis not present

## 2015-11-03 DIAGNOSIS — J329 Chronic sinusitis, unspecified: Secondary | ICD-10-CM | POA: Diagnosis not present

## 2015-11-06 ENCOUNTER — Ambulatory Visit (INDEPENDENT_AMBULATORY_CARE_PROVIDER_SITE_OTHER): Payer: PPO

## 2015-11-06 DIAGNOSIS — I4891 Unspecified atrial fibrillation: Secondary | ICD-10-CM | POA: Diagnosis not present

## 2015-11-06 LAB — POCT INR: INR: 2

## 2015-11-10 ENCOUNTER — Ambulatory Visit (INDEPENDENT_AMBULATORY_CARE_PROVIDER_SITE_OTHER): Payer: PPO | Admitting: *Deleted

## 2015-11-10 DIAGNOSIS — I4891 Unspecified atrial fibrillation: Secondary | ICD-10-CM | POA: Diagnosis not present

## 2015-11-10 LAB — POCT INR: INR: 2.3

## 2015-11-15 ENCOUNTER — Ambulatory Visit: Payer: PPO | Admitting: Cardiology

## 2015-11-17 ENCOUNTER — Ambulatory Visit (INDEPENDENT_AMBULATORY_CARE_PROVIDER_SITE_OTHER): Payer: PPO | Admitting: Pharmacist

## 2015-11-17 DIAGNOSIS — I4891 Unspecified atrial fibrillation: Secondary | ICD-10-CM

## 2015-11-17 LAB — POCT INR: INR: 2.7

## 2015-11-22 DIAGNOSIS — H4423 Degenerative myopia, bilateral: Secondary | ICD-10-CM | POA: Diagnosis not present

## 2015-11-22 DIAGNOSIS — H353131 Nonexudative age-related macular degeneration, bilateral, early dry stage: Secondary | ICD-10-CM | POA: Diagnosis not present

## 2015-11-22 DIAGNOSIS — H43813 Vitreous degeneration, bilateral: Secondary | ICD-10-CM | POA: Diagnosis not present

## 2015-11-22 DIAGNOSIS — H35363 Drusen (degenerative) of macula, bilateral: Secondary | ICD-10-CM | POA: Diagnosis not present

## 2015-11-24 ENCOUNTER — Other Ambulatory Visit: Payer: Self-pay | Admitting: *Deleted

## 2015-11-24 MED ORDER — WARFARIN SODIUM 5 MG PO TABS: ORAL_TABLET | ORAL | 0 refills | Status: DC

## 2015-12-05 DIAGNOSIS — J321 Chronic frontal sinusitis: Secondary | ICD-10-CM | POA: Insufficient documentation

## 2015-12-05 DIAGNOSIS — J329 Chronic sinusitis, unspecified: Secondary | ICD-10-CM | POA: Diagnosis not present

## 2015-12-15 ENCOUNTER — Encounter (INDEPENDENT_AMBULATORY_CARE_PROVIDER_SITE_OTHER): Payer: Self-pay

## 2015-12-15 ENCOUNTER — Ambulatory Visit (INDEPENDENT_AMBULATORY_CARE_PROVIDER_SITE_OTHER): Payer: PPO | Admitting: *Deleted

## 2015-12-15 DIAGNOSIS — I4891 Unspecified atrial fibrillation: Secondary | ICD-10-CM

## 2015-12-15 LAB — POCT INR: INR: 1.9

## 2015-12-18 DIAGNOSIS — Z1389 Encounter for screening for other disorder: Secondary | ICD-10-CM | POA: Diagnosis not present

## 2015-12-18 DIAGNOSIS — F17210 Nicotine dependence, cigarettes, uncomplicated: Secondary | ICD-10-CM | POA: Diagnosis not present

## 2015-12-18 DIAGNOSIS — K219 Gastro-esophageal reflux disease without esophagitis: Secondary | ICD-10-CM | POA: Diagnosis not present

## 2015-12-18 DIAGNOSIS — G4733 Obstructive sleep apnea (adult) (pediatric): Secondary | ICD-10-CM | POA: Diagnosis not present

## 2015-12-18 DIAGNOSIS — R7301 Impaired fasting glucose: Secondary | ICD-10-CM | POA: Diagnosis not present

## 2015-12-18 DIAGNOSIS — Z0000 Encounter for general adult medical examination without abnormal findings: Secondary | ICD-10-CM | POA: Diagnosis not present

## 2015-12-18 DIAGNOSIS — I10 Essential (primary) hypertension: Secondary | ICD-10-CM | POA: Diagnosis not present

## 2015-12-18 DIAGNOSIS — I4891 Unspecified atrial fibrillation: Secondary | ICD-10-CM | POA: Diagnosis not present

## 2015-12-18 DIAGNOSIS — M109 Gout, unspecified: Secondary | ICD-10-CM | POA: Diagnosis not present

## 2015-12-18 DIAGNOSIS — N529 Male erectile dysfunction, unspecified: Secondary | ICD-10-CM | POA: Diagnosis not present

## 2015-12-29 ENCOUNTER — Ambulatory Visit (INDEPENDENT_AMBULATORY_CARE_PROVIDER_SITE_OTHER): Payer: PPO

## 2015-12-29 DIAGNOSIS — I4891 Unspecified atrial fibrillation: Secondary | ICD-10-CM

## 2015-12-29 LAB — POCT INR: INR: 2.4

## 2016-01-22 DIAGNOSIS — M150 Primary generalized (osteo)arthritis: Secondary | ICD-10-CM | POA: Diagnosis not present

## 2016-01-22 DIAGNOSIS — M1A09X0 Idiopathic chronic gout, multiple sites, without tophus (tophi): Secondary | ICD-10-CM | POA: Diagnosis not present

## 2016-01-22 DIAGNOSIS — Z79899 Other long term (current) drug therapy: Secondary | ICD-10-CM | POA: Diagnosis not present

## 2016-01-26 ENCOUNTER — Ambulatory Visit (INDEPENDENT_AMBULATORY_CARE_PROVIDER_SITE_OTHER): Payer: PPO | Admitting: Pharmacist

## 2016-01-26 DIAGNOSIS — I4891 Unspecified atrial fibrillation: Secondary | ICD-10-CM

## 2016-01-26 LAB — POCT INR: INR: 1.8

## 2016-02-01 DIAGNOSIS — H21233 Degeneration of iris (pigmentary), bilateral: Secondary | ICD-10-CM | POA: Diagnosis not present

## 2016-02-01 DIAGNOSIS — H43813 Vitreous degeneration, bilateral: Secondary | ICD-10-CM | POA: Diagnosis not present

## 2016-02-01 DIAGNOSIS — H43392 Other vitreous opacities, left eye: Secondary | ICD-10-CM | POA: Diagnosis not present

## 2016-02-01 DIAGNOSIS — H40013 Open angle with borderline findings, low risk, bilateral: Secondary | ICD-10-CM | POA: Diagnosis not present

## 2016-02-12 ENCOUNTER — Encounter (INDEPENDENT_AMBULATORY_CARE_PROVIDER_SITE_OTHER): Payer: Self-pay | Admitting: Orthopaedic Surgery

## 2016-02-12 ENCOUNTER — Ambulatory Visit (INDEPENDENT_AMBULATORY_CARE_PROVIDER_SITE_OTHER): Payer: PPO | Admitting: Pharmacist

## 2016-02-12 ENCOUNTER — Ambulatory Visit (INDEPENDENT_AMBULATORY_CARE_PROVIDER_SITE_OTHER): Payer: PPO | Admitting: Orthopaedic Surgery

## 2016-02-12 ENCOUNTER — Ambulatory Visit (INDEPENDENT_AMBULATORY_CARE_PROVIDER_SITE_OTHER): Payer: PPO

## 2016-02-12 VITALS — BP 119/62 | HR 50 | Ht 74.0 in | Wt 235.0 lb

## 2016-02-12 DIAGNOSIS — M545 Low back pain: Secondary | ICD-10-CM | POA: Diagnosis not present

## 2016-02-12 DIAGNOSIS — I4891 Unspecified atrial fibrillation: Secondary | ICD-10-CM

## 2016-02-12 LAB — POCT INR: INR: 2

## 2016-02-12 NOTE — Progress Notes (Signed)
Office Visit Note   Patient: Jonathan Vargas           Date of Birth: 1945-10-20           MRN: [id number] Visit Date: 02/12/2016              Requested by: Leeroy Cha, MD [address] [place], [place] [address] PCP: Leeroy Cha, MD   Assessment & Plan: Visit Diagnoses:  1. Acute low back pain, unspecified back pain laterality, with sciatica presence unspecified     Plan: Patient can continue conservative treatment continue his walking program. If he develops radicular symptoms  he will call and let us know.  Follow-Up Instructions: Return if symptoms worsen or fail to improve.   Orders:  Orders Placed This Encounter  Procedures  . XR Lumbar Spine 2-3 Views   No orders of the defined types were placed in this encounter.     Procedures: No procedures performed   Clinical Data: No additional findings.   Subjective: Chief Complaint  Patient presents with  . Lower Back - Pain  . Back Pain    lower back pian now for X10 days, had gotten better for awhile and now gotten worse. use voltaren gel and seems to help, iced it 3 times yesterday and eased up, got out to bed this morning and was in pain.    lower back pian now for X10 days, had gotten better for awhile and now gotten worse. use voltaren gel and seems to help, iced it 3 times yesterday and eased up, got out to bed this morning and was in pain.   Back Pain    Patient's had previous episodes but none as severe as the last 10 days. He's working out with a Clinical research associate at [employer] this helped with strengthening. He sees a lumbosacral corset which seems to help some degree. He is on warfarin and cannot take anti-inflammatory medications.  Review of Systems  Constitutional: Negative.   Musculoskeletal: Positive for back pain.       Positive for gout he said 1 or 2 attacks in the last year he takes help in all 300 mg religiously. He also has colchicine as needed.  Other 14 point review  systems are noncontributory as it pertains to his history of present illness   Objective: Vital Signs: BP 119/62 (BP Location: Right Arm, Patient Position: Sitting)   Pulse (!) 50   Ht 6\' 2"  (1.88 m)   Wt 235 lb (106.6 kg)   BMI 30.17 kg/m   Physical Exam  Constitutional: He is oriented to person, place, and time. He appears well-developed and well-nourished.  HENT:  Head: Normocephalic and atraumatic.  Eyes: EOM are normal. Pupils are equal, round, and reactive to light.  Neck: No tracheal deviation present. No thyromegaly present.  Cardiovascular: Normal rate.   Pulmonary/Chest: Effort normal. He has no wheezes.  Abdominal: Soft. Bowel sounds are normal.  Neurological: He is alert and oriented to person, place, and time.  Skin: Skin is warm and dry. Capillary refill takes less than 2 seconds.  Psychiatric: He has a normal mood and affect. His behavior is normal. Judgment and thought content normal.    Ortho Exam patient pelvis is level gets from sitting to standing normal heel-to-toe gait. Internal rotation of both hips the 30 with minimal discomfort. Mild sciatic notch tenderness on the left negative on the right negative straight leg raising to 90 anterior tib EHL is intact knee and  ankle jerk are intact. He has a trace of lower extremity edema. Palpable pulses. Sensation L5-S1 is intact. Upper extremities hands wrists show no arthropathy  Specialty Comments:  No specialty comments available.  Imaging: Xr Lumbar Spine 2-3 Views  Result Date: 02/12/2016 AP and lateral lumbar x-rays are reviewed. This shows some marginal osteophytes on his hip joints slightly more left than right with mild hip space narrowing on the left. SI joint is normal. Minimal lumbar curvature less than 10. Facet arthropathy noted at L4-5 and L5-S1. L4-5 shows a few millimeters of anterolisthesis and also some disc space narrowing at L5-S1. 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 Obesity 03/29/2015  . Tobacco use 03/29/2015  . 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 Depressive disorder, not elsewhere classified 12/13/2012  . Obstructive sleep apnea 12/13/2012  . Esophageal reflux 12/13/2012  . Impaired fasting glucose 12/13/2012  . Tobacco use disorder 12/13/2012  . 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 Gout, unspecified 12/13/2012  . Long term current use of anticoagulant therapy 12/13/2012  . Atrial fibrillation ([place]) 01/30/2011  . Bradycardia 01/30/2011  . Sleep apnea 01/30/2011  . Atrial flutter ([place]) 01/25/2011  . Essential hypertension, benign 01/25/2011  . Pure hypercholesterolemia 01/25/2011   Past Medical History:  Diagnosis Date  . Acid reflux   . ASD (atrial septal defect)   . Asthma with allergic rhinitis and status asthmaticus   . Atrial fib/flutter, transient   . Chronic anticoagulation   . Depression   . ENT complaint    Dr. Erik Obey  . Erectile dysfunction   . FHx: allergies   . Gout    , Possible, Right Knee x2 ; intermittent milder episodes of pain in hte first MTP - uric acid 7.7- no treatment so far.  . Hemochromatosis   . Hypercholesteremia   . Hyperlipemia   . Hypertension   . Hypogonadism male   . Impaired fasting glucose   . 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  . Seasonal allergies   . Sleep apnea    After weight loss, off of CPAP  . Trigger finger, left    , fourth finger    Family History  Problem Relation Age of Onset  . Stroke Father     stroke  . Diabetes type II Father   . Heart disease Mother     with pacemaker  . Diabetes type II Maternal Grandfather     Past Surgical History:  Procedure Laterality Date  . COLONOSCOPY W/ POLYPECTOMY    . LIVER BIOPSY    . NASAL SINUS SURGERY  1997   Social History   Occupational  History  . retired    Social History Main Topics  . Smoking status: Current Every Day Smoker    Start date: 10/30/2013  . Smokeless tobacco: Not on file     Comment: trying to quit  . Alcohol use 0.0 oz/week     Comment: 2-3 glasses of red wine per night  . Drug use: No  . Sexual activity: Not on file

## 2016-02-28 ENCOUNTER — Ambulatory Visit (INDEPENDENT_AMBULATORY_CARE_PROVIDER_SITE_OTHER): Payer: PPO | Admitting: Pharmacist

## 2016-02-28 ENCOUNTER — Telehealth (INDEPENDENT_AMBULATORY_CARE_PROVIDER_SITE_OTHER): Payer: Self-pay | Admitting: *Deleted

## 2016-02-28 DIAGNOSIS — I4891 Unspecified atrial fibrillation: Secondary | ICD-10-CM

## 2016-02-28 LAB — POCT INR: INR: 2

## 2016-02-28 NOTE — Telephone Encounter (Signed)
Pt requesting copy of his x rays. Dr Lorin Mercy offered them to pt when he was in but he didn't need it then, but he does now.

## 2016-02-28 NOTE — Telephone Encounter (Signed)
CD is made. Patient has been called, patient stated he will pick up CD this afternoon.

## 2016-02-28 NOTE — Telephone Encounter (Signed)
Can you see if patient wants a paper copy or a Cd.  If he wants a CD $5.00 fee.

## 2016-03-06 DIAGNOSIS — M9902 Segmental and somatic dysfunction of thoracic region: Secondary | ICD-10-CM | POA: Diagnosis not present

## 2016-03-06 DIAGNOSIS — S29012A Strain of muscle and tendon of back wall of thorax, initial encounter: Secondary | ICD-10-CM | POA: Diagnosis not present

## 2016-03-06 DIAGNOSIS — M9905 Segmental and somatic dysfunction of pelvic region: Secondary | ICD-10-CM | POA: Diagnosis not present

## 2016-03-06 DIAGNOSIS — S39012A Strain of muscle, fascia and tendon of lower back, initial encounter: Secondary | ICD-10-CM | POA: Diagnosis not present

## 2016-03-06 DIAGNOSIS — M9903 Segmental and somatic dysfunction of lumbar region: Secondary | ICD-10-CM | POA: Diagnosis not present

## 2016-03-06 DIAGNOSIS — M5137 Other intervertebral disc degeneration, lumbosacral region: Secondary | ICD-10-CM | POA: Diagnosis not present

## 2016-03-07 DIAGNOSIS — S39012A Strain of muscle, fascia and tendon of lower back, initial encounter: Secondary | ICD-10-CM | POA: Diagnosis not present

## 2016-03-07 DIAGNOSIS — S29012A Strain of muscle and tendon of back wall of thorax, initial encounter: Secondary | ICD-10-CM | POA: Diagnosis not present

## 2016-03-07 DIAGNOSIS — M9903 Segmental and somatic dysfunction of lumbar region: Secondary | ICD-10-CM | POA: Diagnosis not present

## 2016-03-07 DIAGNOSIS — M9902 Segmental and somatic dysfunction of thoracic region: Secondary | ICD-10-CM | POA: Diagnosis not present

## 2016-03-07 DIAGNOSIS — M9905 Segmental and somatic dysfunction of pelvic region: Secondary | ICD-10-CM | POA: Diagnosis not present

## 2016-03-07 DIAGNOSIS — M5137 Other intervertebral disc degeneration, lumbosacral region: Secondary | ICD-10-CM | POA: Diagnosis not present

## 2016-03-12 DIAGNOSIS — M9902 Segmental and somatic dysfunction of thoracic region: Secondary | ICD-10-CM | POA: Diagnosis not present

## 2016-03-12 DIAGNOSIS — S39012A Strain of muscle, fascia and tendon of lower back, initial encounter: Secondary | ICD-10-CM | POA: Diagnosis not present

## 2016-03-12 DIAGNOSIS — M9905 Segmental and somatic dysfunction of pelvic region: Secondary | ICD-10-CM | POA: Diagnosis not present

## 2016-03-12 DIAGNOSIS — S29012A Strain of muscle and tendon of back wall of thorax, initial encounter: Secondary | ICD-10-CM | POA: Diagnosis not present

## 2016-03-12 DIAGNOSIS — M9903 Segmental and somatic dysfunction of lumbar region: Secondary | ICD-10-CM | POA: Diagnosis not present

## 2016-03-12 DIAGNOSIS — M5137 Other intervertebral disc degeneration, lumbosacral region: Secondary | ICD-10-CM | POA: Diagnosis not present

## 2016-03-14 DIAGNOSIS — M9903 Segmental and somatic dysfunction of lumbar region: Secondary | ICD-10-CM | POA: Diagnosis not present

## 2016-03-14 DIAGNOSIS — S29012A Strain of muscle and tendon of back wall of thorax, initial encounter: Secondary | ICD-10-CM | POA: Diagnosis not present

## 2016-03-14 DIAGNOSIS — M9905 Segmental and somatic dysfunction of pelvic region: Secondary | ICD-10-CM | POA: Diagnosis not present

## 2016-03-14 DIAGNOSIS — S39012A Strain of muscle, fascia and tendon of lower back, initial encounter: Secondary | ICD-10-CM | POA: Diagnosis not present

## 2016-03-14 DIAGNOSIS — M5137 Other intervertebral disc degeneration, lumbosacral region: Secondary | ICD-10-CM | POA: Diagnosis not present

## 2016-03-14 DIAGNOSIS — M9902 Segmental and somatic dysfunction of thoracic region: Secondary | ICD-10-CM | POA: Diagnosis not present

## 2016-03-18 ENCOUNTER — Other Ambulatory Visit: Payer: Self-pay | Admitting: Cardiology

## 2016-03-19 DIAGNOSIS — M9905 Segmental and somatic dysfunction of pelvic region: Secondary | ICD-10-CM | POA: Diagnosis not present

## 2016-03-19 DIAGNOSIS — R972 Elevated prostate specific antigen [PSA]: Secondary | ICD-10-CM | POA: Diagnosis not present

## 2016-03-19 DIAGNOSIS — I10 Essential (primary) hypertension: Secondary | ICD-10-CM | POA: Diagnosis not present

## 2016-03-19 DIAGNOSIS — M9903 Segmental and somatic dysfunction of lumbar region: Secondary | ICD-10-CM | POA: Diagnosis not present

## 2016-03-19 DIAGNOSIS — I4891 Unspecified atrial fibrillation: Secondary | ICD-10-CM | POA: Diagnosis not present

## 2016-03-19 DIAGNOSIS — M9902 Segmental and somatic dysfunction of thoracic region: Secondary | ICD-10-CM | POA: Diagnosis not present

## 2016-03-19 DIAGNOSIS — J4520 Mild intermittent asthma, uncomplicated: Secondary | ICD-10-CM | POA: Diagnosis not present

## 2016-03-19 DIAGNOSIS — S39012A Strain of muscle, fascia and tendon of lower back, initial encounter: Secondary | ICD-10-CM | POA: Diagnosis not present

## 2016-03-19 DIAGNOSIS — Z6831 Body mass index (BMI) 31.0-31.9, adult: Secondary | ICD-10-CM | POA: Diagnosis not present

## 2016-03-19 DIAGNOSIS — E6609 Other obesity due to excess calories: Secondary | ICD-10-CM | POA: Diagnosis not present

## 2016-03-19 DIAGNOSIS — S29012A Strain of muscle and tendon of back wall of thorax, initial encounter: Secondary | ICD-10-CM | POA: Diagnosis not present

## 2016-03-19 DIAGNOSIS — Z125 Encounter for screening for malignant neoplasm of prostate: Secondary | ICD-10-CM | POA: Diagnosis not present

## 2016-03-19 DIAGNOSIS — M5137 Other intervertebral disc degeneration, lumbosacral region: Secondary | ICD-10-CM | POA: Diagnosis not present

## 2016-03-23 ENCOUNTER — Other Ambulatory Visit: Payer: Self-pay | Admitting: Cardiology

## 2016-03-25 ENCOUNTER — Encounter: Payer: Self-pay | Admitting: Cardiology

## 2016-03-25 ENCOUNTER — Ambulatory Visit (INDEPENDENT_AMBULATORY_CARE_PROVIDER_SITE_OTHER): Payer: PPO | Admitting: *Deleted

## 2016-03-25 ENCOUNTER — Ambulatory Visit (INDEPENDENT_AMBULATORY_CARE_PROVIDER_SITE_OTHER): Payer: PPO | Admitting: Cardiology

## 2016-03-25 VITALS — BP 118/70 | HR 52 | Ht 74.0 in | Wt 237.4 lb

## 2016-03-25 DIAGNOSIS — I4891 Unspecified atrial fibrillation: Secondary | ICD-10-CM

## 2016-03-25 DIAGNOSIS — Z7901 Long term (current) use of anticoagulants: Secondary | ICD-10-CM

## 2016-03-25 DIAGNOSIS — G4733 Obstructive sleep apnea (adult) (pediatric): Secondary | ICD-10-CM

## 2016-03-25 DIAGNOSIS — Z720 Tobacco use: Secondary | ICD-10-CM | POA: Diagnosis not present

## 2016-03-25 DIAGNOSIS — I480 Paroxysmal atrial fibrillation: Secondary | ICD-10-CM | POA: Diagnosis not present

## 2016-03-25 LAB — POCT INR: INR: 2.1

## 2016-03-25 NOTE — Progress Notes (Signed)
[facility] [address], [address] [place], [place]  [address] Phone: [phone] Fax:  [phone]  Date:  03/25/2016   ID:  Jonathan Vargas, DOB 1945-07-20, MRN [id number]  PCP:  Leeroy Cha, MD  EP: [name]  History of Present Illness: Jonathan Vargas is a 71 y.o. male with atrial fibrillation/flutter paroxysmal on antiarrhythmic, warfarin, diltiazem, anticoagulation here for followup.    Had stress test 09/01/12 that demonstrated a mild inferior wall defect, possible diaphragmatic attenuation, overall low risk study with normal ejection fraction. Hemachromatosis.   Previous hospitalization in November 2012 demonstrated paroxysmal atrial flutter which converted after 4 hours of diltiazem. He had a postconversion pause of 8.2 seconds, felt flushed.   In early June of 2014, he began to feel some chest discomfort while walking, exertional down both arms that was relieved with rest. Noted when on treadmill when warming up. Had one experiecne when walking up stairs in a parking lot after a dinner show. Had 1/2 bottle of wine. This prompted stress test. No evidence of arrhythmia on treadmill. Worked out since then. Fleeting 4-5 min down both arms. Hypertension is well controlled.   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 Smoking cessation counseling.  Previously he has had a few palpitations, like heart stops for a split second and one second of pain then goes away. he had a good week this week but previously has felt occasional fluttering. At one point he felt his carotid and he felt as though he was in atrial fibrillation and he coughed and he" went out". No syncope, no dizziness.  03/25/14-overall feeling fairly well. I've reviewed office notes from [name] who had last visit with him on 12/27/13. Event monitor at that time showed no evidence of atrial fibrillation. He will see him as needed going forward. Agrees with flecainide.  09/30/14 - felt lilke chest  was locked up last night, like a spasm. Eased off quickly. Had this maybe years ago. Noted CPAP, apnea at times. Dr. [name]. Getting cataracts done.   03/29/15-overall doing well. No recent hospitalizations. Atrial fibrillation under good control. Anticoagulation Works out with Clinical research associate. He has not noticed any significant palpitations. No recent episodes of A. fib. No chest pain, no fevers, no chills, no orthopnea, no PND, no syncope  09/25/15-working out with personal trainer. Occasionally he will feel skipping heartbeat. Rarely tachycardic. Most the time he has a sensation of a PAC every fifth or sixth beat at times. He notices compensatory pause. No syncope. No chest pain. Rare sensation below left breast.    Wt Readings from Last 3 Encounters:  02/12/16 235 lb (106.6 kg)  09/25/15 238 lb 12.8 oz (108.3 kg)  04/19/15 240 lb (108.9 kg)     Past Medical History:  Diagnosis Date  . Acid reflux   . ASD (atrial septal defect)   . Asthma with allergic rhinitis and status asthmaticus   . Atrial fib/flutter, transient   . Chronic anticoagulation   . Depression   . ENT complaint    Dr. Erik Obey  . Erectile dysfunction   . FHx: allergies   . Gout    , Possible, Right Knee x2 ; intermittent milder episodes of pain in hte first MTP - uric acid 7.7- no treatment so far.  . Hemochromatosis   . Hypercholesteremia   . Hyperlipemia   . Hypertension   . Hypogonadism male   . Impaired fasting glucose   . 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  . Seasonal allergies   . Sleep apnea    After weight loss, off of CPAP  . Trigger finger, left    , fourth finger    Past Surgical History:  Procedure Laterality Date  . COLONOSCOPY W/ POLYPECTOMY    . LIVER BIOPSY    . NASAL SINUS SURGERY  1997    Current Outpatient Prescriptions  Medication Sig Dispense Refill  . allopurinol (ZYLOPRIM) 300 MG tablet Take 300 mg by mouth  daily.  1  . CALCIUM PO Take 200 mg by mouth 2 (two) times daily.    . cetirizine (ZYRTEC) 10 MG tablet Take 10 mg by mouth daily as needed for allergies.    . cholecalciferol (VITAMIN D) 1000 units tablet Take 1,000 Units by mouth daily.    Marland Kitchen COLCRYS 0.6 MG tablet Take 0.6 mg by mouth 2 (two) times daily as needed (for gout).     Marland Kitchen diclofenac sodium (VOLTAREN) 1 % GEL Apply 1 application topically 4 (four) times daily as needed (for pain).   3  . diltiazem (CARDIZEM CD) 120 MG 24 hr capsule TAKE 1 CAPSULE (120 MG TOTAL) BY MOUTH DAILY. 90 capsule 2  . fexofenadine (ALLEGRA) 60 MG tablet Take 60 mg by mouth daily as needed. For allergies    . flecainide (TAMBOCOR) 50 MG tablet Take 1 tablet (50 mg total) by mouth 2 (two) times daily. 180 tablet 3  . fluticasone (FLONASE) 50 MCG/ACT nasal spray Place 1 spray into the nose daily.     . irbesartan (AVAPRO) 150 MG tablet TAKE 1 TABLET BY MOUTH EVERY DAY 90 tablet 2  . Misc Natural Products (LUTEIN 20) CAPS Take 40 mg by mouth 2 (two) times daily.     . NON FORMULARY CPAP    . PATADAY 0.2 % SOLN 1 drop into both eyes as needed for allergies  4  . PROAIR RESPICLICK 123XX123 (90 Base) MCG/ACT AEPB Inhale 1 puff into the lungs daily as needed. Shortness of breath  1  . ranitidine (ZANTAC) 300 MG tablet Take 300 mg by mouth every evening.     . warfarin (COUMADIN) 5 MG tablet TAKE AS DIRECTED BY ANTICOAGULATION CLINIC 180 tablet 0   No current facility-administered medications for this visit.     Allergies:    Allergies  Allergen Reactions  . Clindamycin/Lincomycin Diarrhea and Other (See Comments)    Stomach pain   . Amoxicillin Rash    Social History:  The patient  reports that he has been smoking.  He started smoking about 2 years ago. He does not have any smokeless tobacco history on file. He reports that he drinks alcohol. He reports that he does not use drugs.   ROS:  Please see the history of present illness.   Denies any fevers, chills,  orthopnea, PND    PHYSICAL EXAM: VS:  There were no vitals taken for this visit. Well nourished, well developed, in no acute distress HEENT: normal Neck: no JVD Cardiac:  normal S1, S2; bradycardic regular; no murmur Lungs:  clear to auscultation bilaterally, no wheezing, rhonchi or rales Abd: soft, nontender, no hepatomegalyOverweight Ext: Mild right lower extremity edema Skin: warm and dry Neuro: no focal abnormalities noted  EKG:  03/29/15-sinus bradycardia rate 54 with left axis deviation, no other significant abnormalities. Personally viewed-prior 03/25/14-sinus bradycardia rate 47, no other abnormalities. PR interval 136 ms, QTC 394 ms.  Event monitor: 11/2013-no evidence of atrial fibrillation.  ASSESSMENT AND PLAN:  1. Atrial fibrillation, paroxysmal-doing well, continue antiarrhythmic, low-dose flecainide. No recent episodes. No changes made. Appreciate assistance from [name]. Office notes reviewed. Has had atrial flutter as well. Continuing with low-dose flecainide, low-dose diltiazem. Heart rate is bradycardic. At times, he may feel lightheaded or symptomatic, one time he felt this after working out. After he hydrated and ate breakfast he felt better. Continue to watch this. Prior monitor showed no evidence of dangerous bradycardia. Not interested in Eliquis or Xarelto at this time. Discussed the potential need for pacemaker at times. 2. Hypertension- Overall doing well. Previously minimally elevated 3. Obesity- Working with Clinical research associate. Decrease carbohydrates. 10 pounds down 4. Fatigue-improved Continue with exercise. He has a Physiological scientist. 5. Tobacco cessation.=5-7 day, strongly encourage cessation. High risk 6. Obstructive sleep apnea-continue to follow with Dr. [name]. 7. Six-month follow-up  Signed, Candee Furbish, MD [facility]  03/25/2016 8:11 AM        [address], [address] [place], [place]  [address] Phone: [phone] Fax:  [phone]  Date:  03/25/2016    ID:  Jonathan Vargas, DOB 12-15-1945, MRN [id number]  PCP:  Leeroy Cha, MD  EP: [name]  History of Present Illness: Jonathan Vargas is a 71 y.o. male with atrial fibrillation/flutter paroxysmal on antiarrhythmic, warfarin, diltiazem, anticoagulation here for followup.    Had stress test 09/01/12 that demonstrated a mild inferior wall defect, possible diaphragmatic attenuation, overall low risk study with normal ejection fraction. Hemachromatosis.   Previous hospitalization in November 2012 demonstrated paroxysmal atrial flutter which converted after 4 hours of diltiazem. He had a postconversion pause of 8.2 seconds, felt flushed.   In early June of 2014, he began to feel some chest discomfort while walking, exertional down both arms that was relieved with rest. Noted when on treadmill when warming up. Had one experiecne when walking up stairs in a parking lot after a dinner show. Had 1/2 bottle of wine. This prompted stress test. No evidence of arrhythmia on treadmill. Worked out since then. Fleeting 4-5 min down both arms. Hypertension is well controlled.   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 Smoking cessation counseling.  Previously he has had a few palpitations, like heart stops for a split second and one second of pain then goes away. he had a good week this week but previously has felt occasional fluttering. At one point he felt his carotid and he felt as though he was in atrial fibrillation and he coughed and he" went out". No syncope, no dizziness.  03/25/14-overall feeling fairly well. I've reviewed office notes from [name] who had last visit with him on 12/27/13. Event monitor at that time showed no evidence of atrial fibrillation. He will see him as needed going forward. Agrees with flecainide.  09/30/14 - felt lilke chest was locked up last night, like a spasm. Eased off quickly. Had this maybe years ago. Noted CPAP, apnea at times. Dr. [name].  Getting cataracts done.   03/29/15-overall doing well. No recent hospitalizations. Atrial fibrillation under good control. Anticoagulation Works out with Clinical research associate. He has not noticed any significant palpitations. No recent episodes of A. fib. No chest pain, no fevers, no chills, no orthopnea, no PND, no syncope  09/25/15-working out with personal trainer. Occasionally he will feel skipping heartbeat. Rarely tachycardic. Most the time he has a sensation of a PAC every fifth or sixth beat at times. He notices compensatory pause. No syncope. No chest pain. Rare sensation below left  breast.  03/25/16-doing well, no chest pain, no sig to be, no shortness of breath. Still working out with trainer. No mention of palpitations.   Wt Readings from Last 3 Encounters:  02/12/16 235 lb (106.6 kg)  09/25/15 238 lb 12.8 oz (108.3 kg)  04/19/15 240 lb (108.9 kg)     Past Medical History:  Diagnosis Date  . Acid reflux   . ASD (atrial septal defect)   . Asthma with allergic rhinitis and status asthmaticus   . Atrial fib/flutter, transient   . Chronic anticoagulation   . Depression   . ENT complaint    Dr. Erik Obey  . Erectile dysfunction   . FHx: allergies   . Gout    , Possible, Right Knee x2 ; intermittent milder episodes of pain in hte first MTP - uric acid 7.7- no treatment so far.  . Hemochromatosis   . Hypercholesteremia   . Hyperlipemia   . Hypertension   . Hypogonadism male   . Impaired fasting glucose   . 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  . Seasonal allergies   . Sleep apnea    After weight loss, off of CPAP  . Trigger finger, left    , fourth finger    Past Surgical History:  Procedure Laterality Date  . COLONOSCOPY W/ POLYPECTOMY    . LIVER BIOPSY    . NASAL SINUS SURGERY  1997    Current Outpatient Prescriptions  Medication Sig Dispense Refill  . allopurinol (ZYLOPRIM) 300 MG tablet Take 300 mg by  mouth daily.  1  . CALCIUM PO Take 200 mg by mouth 2 (two) times daily.    . cetirizine (ZYRTEC) 10 MG tablet Take 10 mg by mouth daily as needed for allergies.    . cholecalciferol (VITAMIN D) 1000 units tablet Take 1,000 Units by mouth daily.    Marland Kitchen COLCRYS 0.6 MG tablet Take 0.6 mg by mouth 2 (two) times daily as needed (for gout).     Marland Kitchen diclofenac sodium (VOLTAREN) 1 % GEL Apply 1 application topically 4 (four) times daily as needed (for pain).   3  . diltiazem (CARDIZEM CD) 120 MG 24 hr capsule TAKE 1 CAPSULE (120 MG TOTAL) BY MOUTH DAILY. 90 capsule 2  . fexofenadine (ALLEGRA) 60 MG tablet Take 60 mg by mouth daily as needed. For allergies    . flecainide (TAMBOCOR) 50 MG tablet Take 1 tablet (50 mg total) by mouth 2 (two) times daily. 180 tablet 3  . fluticasone (FLONASE) 50 MCG/ACT nasal spray Place 1 spray into the nose daily.     . irbesartan (AVAPRO) 150 MG tablet TAKE 1 TABLET BY MOUTH EVERY DAY 90 tablet 2  . Misc Natural Products (LUTEIN 20) CAPS Take 40 mg by mouth 2 (two) times daily.     . NON FORMULARY CPAP    . PATADAY 0.2 % SOLN 1 drop into both eyes as needed for allergies  4  . PROAIR RESPICLICK 123XX123 (90 Base) MCG/ACT AEPB Inhale 1 puff into the lungs daily as needed. Shortness of breath  1  . ranitidine (ZANTAC) 300 MG tablet Take 300 mg by mouth every evening.     . warfarin (COUMADIN) 5 MG tablet TAKE AS DIRECTED BY ANTICOAGULATION CLINIC 180 tablet 0   No current facility-administered medications for this visit.     Allergies:    Allergies  Allergen Reactions  . Clindamycin/Lincomycin Diarrhea and Other (See Comments)  Stomach pain   . Amoxicillin Rash    Social History:  The patient  reports that he has been smoking.  He started smoking about 2 years ago. He does not have any smokeless tobacco history on file. He reports that he drinks alcohol. He reports that he does not use drugs.   ROS:  Please see the history of present illness.   Denies any fevers,  chills, orthopnea, PND    PHYSICAL EXAM: VS:  There were no vitals taken for this visit. Well nourished, well developed, in no acute distress HEENT: normal Neck: no JVD Cardiac:  normal S1, S2; bradycardic regular; no murmur Lungs:  clear to auscultation bilaterally, no wheezing, rhonchi or rales Abd: soft, nontender, no hepatomegalyOverweight Ext: Mild right lower extremity edema Skin: warm and dry Neuro: no focal abnormalities noted  EKG:  03/29/15-sinus bradycardia rate 54 with left axis deviation, no other significant abnormalities. Personally viewed-prior 03/25/14-sinus bradycardia rate 47, no other abnormalities. PR interval 136 ms, QTC 394 ms.  Event monitor: 11/2013-no evidence of atrial fibrillation.    ASSESSMENT AND PLAN:  8. Atrial fibrillation, paroxysmal-doing well, continue antiarrhythmic, low-dose flecainide. No recent episodes. No changes made. Appreciate assistance from [name]. Office notes reviewed. Has had atrial flutter as well. Continuing with low-dose flecainide, low-dose diltiazem. Heart rate is bradycardic.  Prior monitor showed no evidence of dangerous bradycardia. Not interested in Eliquis or Xarelto at this time. Discussed the potential need for pacemaker previously  9. Hypertension- Overall doing well. Previously minimally elevated 10. Obesity- Working with Clinical research associate. Decrease carbohydrates. [employer]. 11. Fatigue-improved Continue with exercise. He has a Physiological scientist. 12. Tobacco cessation.=5-7 day, strongly encourage cessation. High risk 13. Obstructive sleep apnea-continue to follow with Dr. [name]. 14. Six-month follow-up  Signed, Candee Furbish, MD [facility]  03/25/2016 8:11 AM

## 2016-03-25 NOTE — Patient Instructions (Signed)

## 2016-03-26 DIAGNOSIS — M9903 Segmental and somatic dysfunction of lumbar region: Secondary | ICD-10-CM | POA: Diagnosis not present

## 2016-03-26 DIAGNOSIS — M5137 Other intervertebral disc degeneration, lumbosacral region: Secondary | ICD-10-CM | POA: Diagnosis not present

## 2016-03-26 DIAGNOSIS — S39012A Strain of muscle, fascia and tendon of lower back, initial encounter: Secondary | ICD-10-CM | POA: Diagnosis not present

## 2016-03-26 DIAGNOSIS — M9905 Segmental and somatic dysfunction of pelvic region: Secondary | ICD-10-CM | POA: Diagnosis not present

## 2016-03-26 DIAGNOSIS — M9902 Segmental and somatic dysfunction of thoracic region: Secondary | ICD-10-CM | POA: Diagnosis not present

## 2016-03-26 DIAGNOSIS — S29012A Strain of muscle and tendon of back wall of thorax, initial encounter: Secondary | ICD-10-CM | POA: Diagnosis not present

## 2016-03-29 ENCOUNTER — Other Ambulatory Visit (HOSPITAL_COMMUNITY): Payer: Self-pay | Admitting: *Deleted

## 2016-04-01 ENCOUNTER — Encounter (HOSPITAL_COMMUNITY)
Admission: RE | Admit: 2016-04-01 | Discharge: 2016-04-01 | Disposition: A | Discharge status: Home / Self Care | Payer: PPO | Source: Ambulatory Visit | Attending: Internal Medicine | Admitting: Internal Medicine

## 2016-04-01 DIAGNOSIS — S29012A Strain of muscle and tendon of back wall of thorax, initial encounter: Secondary | ICD-10-CM | POA: Diagnosis not present

## 2016-04-01 DIAGNOSIS — S39012A Strain of muscle, fascia and tendon of lower back, initial encounter: Secondary | ICD-10-CM | POA: Diagnosis not present

## 2016-04-01 DIAGNOSIS — M9903 Segmental and somatic dysfunction of lumbar region: Secondary | ICD-10-CM | POA: Diagnosis not present

## 2016-04-01 DIAGNOSIS — M9905 Segmental and somatic dysfunction of pelvic region: Secondary | ICD-10-CM | POA: Diagnosis not present

## 2016-04-01 DIAGNOSIS — M5137 Other intervertebral disc degeneration, lumbosacral region: Secondary | ICD-10-CM | POA: Diagnosis not present

## 2016-04-01 DIAGNOSIS — M9902 Segmental and somatic dysfunction of thoracic region: Secondary | ICD-10-CM | POA: Diagnosis not present

## 2016-04-01 LAB — POCT HEMOGLOBIN-HEMACUE: Hemoglobin: 14.5 g/dL (ref 13.0–17.0)

## 2016-04-01 MED ORDER — LIDOCAINE HCL 2 % IJ SOLN: 0.1000 mL | Freq: Once | INTRAMUSCULAR | Status: DC

## 2016-04-01 NOTE — Progress Notes (Signed)
hemocue 14.3 today.  phlebotomized 500cc from right AC and pt tolerated well.

## 2016-04-08 ENCOUNTER — Ambulatory Visit (INDEPENDENT_AMBULATORY_CARE_PROVIDER_SITE_OTHER): Payer: PPO | Admitting: *Deleted

## 2016-04-08 DIAGNOSIS — M9903 Segmental and somatic dysfunction of lumbar region: Secondary | ICD-10-CM | POA: Diagnosis not present

## 2016-04-08 DIAGNOSIS — I4891 Unspecified atrial fibrillation: Secondary | ICD-10-CM | POA: Diagnosis not present

## 2016-04-08 DIAGNOSIS — M9905 Segmental and somatic dysfunction of pelvic region: Secondary | ICD-10-CM | POA: Diagnosis not present

## 2016-04-08 DIAGNOSIS — M5137 Other intervertebral disc degeneration, lumbosacral region: Secondary | ICD-10-CM | POA: Diagnosis not present

## 2016-04-08 DIAGNOSIS — S39012A Strain of muscle, fascia and tendon of lower back, initial encounter: Secondary | ICD-10-CM | POA: Diagnosis not present

## 2016-04-08 DIAGNOSIS — M9902 Segmental and somatic dysfunction of thoracic region: Secondary | ICD-10-CM | POA: Diagnosis not present

## 2016-04-08 DIAGNOSIS — S29012A Strain of muscle and tendon of back wall of thorax, initial encounter: Secondary | ICD-10-CM | POA: Diagnosis not present

## 2016-04-08 LAB — POCT INR: INR: 2.3

## 2016-04-10 DIAGNOSIS — M9903 Segmental and somatic dysfunction of lumbar region: Secondary | ICD-10-CM | POA: Diagnosis not present

## 2016-04-10 DIAGNOSIS — M5137 Other intervertebral disc degeneration, lumbosacral region: Secondary | ICD-10-CM | POA: Diagnosis not present

## 2016-04-10 DIAGNOSIS — S39012A Strain of muscle, fascia and tendon of lower back, initial encounter: Secondary | ICD-10-CM | POA: Diagnosis not present

## 2016-04-10 DIAGNOSIS — S29012A Strain of muscle and tendon of back wall of thorax, initial encounter: Secondary | ICD-10-CM | POA: Diagnosis not present

## 2016-04-10 DIAGNOSIS — M9905 Segmental and somatic dysfunction of pelvic region: Secondary | ICD-10-CM | POA: Diagnosis not present

## 2016-04-10 DIAGNOSIS — M9902 Segmental and somatic dysfunction of thoracic region: Secondary | ICD-10-CM | POA: Diagnosis not present

## 2016-04-15 DIAGNOSIS — S29012A Strain of muscle and tendon of back wall of thorax, initial encounter: Secondary | ICD-10-CM | POA: Diagnosis not present

## 2016-04-15 DIAGNOSIS — M9903 Segmental and somatic dysfunction of lumbar region: Secondary | ICD-10-CM | POA: Diagnosis not present

## 2016-04-15 DIAGNOSIS — M9905 Segmental and somatic dysfunction of pelvic region: Secondary | ICD-10-CM | POA: Diagnosis not present

## 2016-04-15 DIAGNOSIS — S39012A Strain of muscle, fascia and tendon of lower back, initial encounter: Secondary | ICD-10-CM | POA: Diagnosis not present

## 2016-04-15 DIAGNOSIS — M5137 Other intervertebral disc degeneration, lumbosacral region: Secondary | ICD-10-CM | POA: Diagnosis not present

## 2016-04-15 DIAGNOSIS — M9902 Segmental and somatic dysfunction of thoracic region: Secondary | ICD-10-CM | POA: Diagnosis not present

## 2016-04-16 ENCOUNTER — Other Ambulatory Visit: Payer: Self-pay | Admitting: Cardiology

## 2016-04-17 DIAGNOSIS — M9903 Segmental and somatic dysfunction of lumbar region: Secondary | ICD-10-CM | POA: Diagnosis not present

## 2016-04-17 DIAGNOSIS — S39012A Strain of muscle, fascia and tendon of lower back, initial encounter: Secondary | ICD-10-CM | POA: Diagnosis not present

## 2016-04-17 DIAGNOSIS — M9902 Segmental and somatic dysfunction of thoracic region: Secondary | ICD-10-CM | POA: Diagnosis not present

## 2016-04-17 DIAGNOSIS — M9905 Segmental and somatic dysfunction of pelvic region: Secondary | ICD-10-CM | POA: Diagnosis not present

## 2016-04-17 DIAGNOSIS — M5137 Other intervertebral disc degeneration, lumbosacral region: Secondary | ICD-10-CM | POA: Diagnosis not present

## 2016-04-17 DIAGNOSIS — S29012A Strain of muscle and tendon of back wall of thorax, initial encounter: Secondary | ICD-10-CM | POA: Diagnosis not present

## 2016-04-19 DIAGNOSIS — H40013 Open angle with borderline findings, low risk, bilateral: Secondary | ICD-10-CM | POA: Diagnosis not present

## 2016-04-19 DIAGNOSIS — H35033 Hypertensive retinopathy, bilateral: Secondary | ICD-10-CM | POA: Diagnosis not present

## 2016-04-19 DIAGNOSIS — H353134 Nonexudative age-related macular degeneration, bilateral, advanced atrophic with subfoveal involvement: Secondary | ICD-10-CM | POA: Diagnosis not present

## 2016-04-19 DIAGNOSIS — H21233 Degeneration of iris (pigmentary), bilateral: Secondary | ICD-10-CM | POA: Diagnosis not present

## 2016-04-22 DIAGNOSIS — M9902 Segmental and somatic dysfunction of thoracic region: Secondary | ICD-10-CM | POA: Diagnosis not present

## 2016-04-22 DIAGNOSIS — M5137 Other intervertebral disc degeneration, lumbosacral region: Secondary | ICD-10-CM | POA: Diagnosis not present

## 2016-04-22 DIAGNOSIS — M9903 Segmental and somatic dysfunction of lumbar region: Secondary | ICD-10-CM | POA: Diagnosis not present

## 2016-04-22 DIAGNOSIS — S39012A Strain of muscle, fascia and tendon of lower back, initial encounter: Secondary | ICD-10-CM | POA: Diagnosis not present

## 2016-04-22 DIAGNOSIS — R972 Elevated prostate specific antigen [PSA]: Secondary | ICD-10-CM | POA: Diagnosis not present

## 2016-04-22 DIAGNOSIS — S29012A Strain of muscle and tendon of back wall of thorax, initial encounter: Secondary | ICD-10-CM | POA: Diagnosis not present

## 2016-04-22 DIAGNOSIS — M9905 Segmental and somatic dysfunction of pelvic region: Secondary | ICD-10-CM | POA: Diagnosis not present

## 2016-04-29 ENCOUNTER — Ambulatory Visit (INDEPENDENT_AMBULATORY_CARE_PROVIDER_SITE_OTHER): Payer: PPO

## 2016-04-29 DIAGNOSIS — M5137 Other intervertebral disc degeneration, lumbosacral region: Secondary | ICD-10-CM | POA: Diagnosis not present

## 2016-04-29 DIAGNOSIS — M9902 Segmental and somatic dysfunction of thoracic region: Secondary | ICD-10-CM | POA: Diagnosis not present

## 2016-04-29 DIAGNOSIS — S39012A Strain of muscle, fascia and tendon of lower back, initial encounter: Secondary | ICD-10-CM | POA: Diagnosis not present

## 2016-04-29 DIAGNOSIS — S29012A Strain of muscle and tendon of back wall of thorax, initial encounter: Secondary | ICD-10-CM | POA: Diagnosis not present

## 2016-04-29 DIAGNOSIS — I4891 Unspecified atrial fibrillation: Secondary | ICD-10-CM

## 2016-04-29 DIAGNOSIS — M9905 Segmental and somatic dysfunction of pelvic region: Secondary | ICD-10-CM | POA: Diagnosis not present

## 2016-04-29 DIAGNOSIS — M9903 Segmental and somatic dysfunction of lumbar region: Secondary | ICD-10-CM | POA: Diagnosis not present

## 2016-04-29 LAB — POCT INR: INR: 2

## 2016-05-09 DIAGNOSIS — C44529 Squamous cell carcinoma of skin of other part of trunk: Secondary | ICD-10-CM | POA: Diagnosis not present

## 2016-05-09 DIAGNOSIS — Z85828 Personal history of other malignant neoplasm of skin: Secondary | ICD-10-CM | POA: Diagnosis not present

## 2016-05-09 DIAGNOSIS — D485 Neoplasm of uncertain behavior of skin: Secondary | ICD-10-CM | POA: Diagnosis not present

## 2016-05-25 ENCOUNTER — Telehealth: Payer: Self-pay | Admitting: Cardiology

## 2016-05-25 NOTE — Telephone Encounter (Signed)
    Pt of Dr. Marlou Porch with h/o PAF on Flecainide, Cardizem and Coumadin. Woke up around 2AM with tachy palpitations. HR was in the 150s. He took an extra dose of Flecainide. Rate improved but still with irregular pulse. No other symptoms. No CP, dyspnea, syncope/ near syncope. He reports full compliance with meds. He had 2 glasses of wine last night. He is now due for his morning meds, Cardizem and Flecainide. Pt advised to take and monitor symptoms. Avoid caffeine and ETOH. Call back if no improvement. Go to ED if hemodynamically unstable (pt notified of what to look for/ concerning symptoms). Pt also advised to call the office on Monday to set up an appointment with an APP if still with any issues, not requiring immediate attention over the weekend. He verbalized understanding.   Heidi Lemay  05/25/2016 7:35 AM

## 2016-05-27 ENCOUNTER — Encounter: Payer: Self-pay | Admitting: Nurse Practitioner

## 2016-05-27 ENCOUNTER — Encounter (INDEPENDENT_AMBULATORY_CARE_PROVIDER_SITE_OTHER): Payer: Self-pay

## 2016-05-27 ENCOUNTER — Ambulatory Visit (INDEPENDENT_AMBULATORY_CARE_PROVIDER_SITE_OTHER): Payer: PPO | Admitting: *Deleted

## 2016-05-27 ENCOUNTER — Ambulatory Visit (INDEPENDENT_AMBULATORY_CARE_PROVIDER_SITE_OTHER): Payer: PPO | Admitting: Nurse Practitioner

## 2016-05-27 VITALS — BP 100/60 | HR 61 | Ht 74.0 in | Wt 232.0 lb

## 2016-05-27 DIAGNOSIS — I483 Typical atrial flutter: Secondary | ICD-10-CM

## 2016-05-27 DIAGNOSIS — Z7901 Long term (current) use of anticoagulants: Secondary | ICD-10-CM | POA: Diagnosis not present

## 2016-05-27 DIAGNOSIS — I4891 Unspecified atrial fibrillation: Secondary | ICD-10-CM | POA: Diagnosis not present

## 2016-05-27 DIAGNOSIS — Z720 Tobacco use: Secondary | ICD-10-CM | POA: Diagnosis not present

## 2016-05-27 DIAGNOSIS — I480 Paroxysmal atrial fibrillation: Secondary | ICD-10-CM

## 2016-05-27 LAB — POCT INR: INR: 2.1

## 2016-05-27 NOTE — Progress Notes (Addendum)
CARDIOLOGY OFFICE NOTE  Date:  05/27/2016    Jonathan Vargas Date of Birth: 07-May-1945 Medical Record #[id number]  PCP:  Leeroy Cha, MD  Cardiologist:  [facility]  Chief Complaint  Patient presents with  . Atrial Fibrillation    Work in visit - seen for Dr. Marlou Porch    History of Present Illness: Jonathan Vargas is a 71 y.o. male who presents today for a work in visit. Seen for Dr. Marlou Porch.   He has a history of atrial fibrillation/flutter paroxysmal on antiarrhythmic and is on warfarin for his anticoagulation. Had stress test 09/01/12 that demonstrated a mild inferior wall defect, possible diaphragmatic attenuation, overall low risk study with normal ejection fraction. Other issues include HTN, prior carotid artery dissection in 1997, ongoing tobacco abuse, hemachromatosis. Previous hospitalization in November 2012 demonstrated paroxysmal atrial flutter which converted after 4 hours of diltiazem. He had a postconversion pause of 8.2 seconds, felt flushed.  He has seen [name] in the past - now following just prn. He has had some chronic palpitations since.   Last seen here back in January - rare palpitation noted but overall seemed to be at his baseline.   Phone call Saturday - "Pt of Dr. Marlou Porch with h/o PAF on Flecainide, Cardizem and Coumadin. Woke up around 2AM with tachy palpitations. HR was in the 150s. He took an extra dose of Flecainide. Rate improved but still with irregular pulse. No other symptoms. No CP, dyspnea, syncope/ near syncope. He reports full compliance with meds. He had 2 glasses of wine last night. He is now due for his morning meds, Cardizem and Flecainide. Pt advised to take and monitor symptoms. Avoid caffeine and ETOH. Call back if no improvement. Go to ED if hemodynamically unstable (pt notified of what to look for/ concerning symptoms). Pt also advised to call the office on Monday to set up an appointment with an APP if still with any issues, not  requiring immediate attention over the weekend. He verbalized understanding."  Thus added to my schedule for today.    Comes in today. Here alone. He notes he has been out of rhythm since the weekend - feels tired. Little chest discomfort associated. He has 2 glasses of wine every night. Had had 5 or 6 squares of dark chocolate as well. He feels the palpitations. No frank syncope reported. He has continued to work with a trainer to help with his weight loss. He is compliant with CPAP and medicines. BP is running low at home - was down in the 00'B systolic last week - he felt presyncopal.   Past Medical History:  Diagnosis Date  . Acid reflux   . ASD (atrial septal defect)   . Asthma with allergic rhinitis and status asthmaticus   . Atrial fib/flutter, transient   . Chronic anticoagulation   . Depression   . ENT complaint    Dr. Erik Obey  . Erectile dysfunction   . FHx: allergies   . Gout    , Possible, Right Knee x2 ; intermittent milder episodes of pain in hte first MTP - uric acid 7.7- no treatment so far.  . Hemochromatosis   . Hypercholesteremia   . Hyperlipemia   . Hypertension   . Hypogonadism male   . Impaired fasting glucose   . 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  . Seasonal allergies   . Sleep apnea    After weight loss, off of  CPAP  . Trigger finger, left    , fourth finger    Past Surgical History:  Procedure Laterality Date  . COLONOSCOPY W/ POLYPECTOMY    . LIVER BIOPSY    . NASAL SINUS SURGERY  1997     Medications: Current Outpatient Prescriptions  Medication Sig Dispense Refill  . allopurinol (ZYLOPRIM) 300 MG tablet Take 300 mg by mouth daily.  1  . CALCIUM PO Take 200 mg by mouth 2 (two) times daily.    . cetirizine (ZYRTEC) 10 MG tablet Take 10 mg by mouth daily as needed for allergies.    . COLCRYS 0.6 MG tablet Take 0.6 mg by mouth 2 (two) times daily as needed (for gout).      Marland Kitchen diclofenac sodium (VOLTAREN) 1 % GEL Apply 1 application topically 4 (four) times daily as needed (for pain).   3  . diltiazem (CARDIZEM CD) 120 MG 24 hr capsule Take 1 capsule (120 mg total) by mouth daily. 90 capsule 2  . fexofenadine (ALLEGRA) 60 MG tablet Take 60 mg by mouth daily as needed. For allergies    . flecainide (TAMBOCOR) 50 MG tablet Take 1 tablet (50 mg total) by mouth 2 (two) times daily. 180 tablet 3  . fluticasone (FLONASE) 50 MCG/ACT nasal spray Place 1 spray into the nose daily.     . Misc Natural Products (LUTEIN 20) CAPS Take 40 mg by mouth 2 (two) times daily.     . NON FORMULARY CPAP    . PROAIR RESPICLICK 720 (90 Base) MCG/ACT AEPB Inhale 1 puff into the lungs daily as needed. Shortness of breath  1  . ranitidine (ZANTAC) 300 MG tablet Take 300 mg by mouth every evening.     . warfarin (COUMADIN) 5 MG tablet TAKE AS DIRECTED BY ANTICOAGULATION CLINIC 180 tablet 0   No current facility-administered medications for this visit.     Allergies: Allergies  Allergen Reactions  . Clindamycin/Lincomycin Diarrhea and Other (See Comments)    Stomach pain   . Amoxicillin Rash    Social History: The patient  reports that he quit smoking 2 days ago. He started smoking about 2 years ago. He has never used smokeless tobacco. He reports that he drinks alcohol. He reports that he does not use drugs.   Family History: The patient's family history includes Diabetes type II in his father and maternal grandfather; Heart disease in his mother; Stroke in his father.   Review of Systems: Please see the history of present illness.   Otherwise, the review of systems is positive for none.   All other systems are reviewed and negative.   Physical Exam: VS:  BP 100/60   Pulse 61   Ht 6\' 2"  (1.88 m)   Wt 232 lb (105.2 kg)   BMI 29.79 kg/m  .  BMI Body mass index is 29.79 kg/m.  Wt Readings from Last 3 Encounters:  05/27/16 232 lb (105.2 kg)  04/01/16 234 lb (106.1 kg)    03/25/16 237 lb 6.4 oz (107.7 kg)    General:  Obese male who is alert and in no acute distress. Looks older than his stated age.   HEENT: Normal.  Neck: Supple, no JVD, carotid bruits, or masses noted.  Cardiac: Irregular irregular rhythm. Rate is ok. No murmurs, rubs, or gallops. No edema.  Respiratory:  Lungs are coarse to auscultation bilaterally with normal work of breathing.  GI: Soft and nontender.  MS: No deformity or atrophy. Gait and ROM  intact.  Skin: Warm and dry. Color is normal.  Neuro:  Strength and sensation are intact and no gross focal deficits noted.  Psych: Alert, appropriate and with normal affect.   LABORATORY DATA:  EKG:  EKG is ordered today. This demonstrates atrial flutter 4:1 with VR of 61.  Lab Results  Component Value Date   WBC 4.7 01/25/2011   HGB 14.5 04/01/2016   HCT 44.1 01/25/2011   PLT 212 01/25/2011   GLUCOSE 123 (H) 01/25/2011   NA 141 01/25/2011   K 4.5 01/25/2011   CL 106 01/25/2011   CREATININE 1.14 01/25/2011   BUN 23 01/25/2011   CO2 27 01/25/2011   INR 2.0 04/29/2016   Lab Results  Component Value Date   INR 2.0 04/29/2016   INR 2.3 04/08/2016   INR 2.1 03/25/2016   BNP (last 3 results) No results for input(s): BNP in the last 8760 hours.  ProBNP (last 3 results) No results for input(s): PROBNP in the last 8760 hours.   Other Studies Reviewed Today:  Echo Study Conclusions from 2012  - Left ventricle: The cavity size was normal. There was moderate concentric hypertrophy. Systolic function was normal. The estimated ejection fraction was in the range of 60% to 65%. Wall motion was normal; there were no regional wall motion abnormalities. Left ventricular diastolic function parameters were normal. - Left atrium: The atrium was mildly dilated.   Assessment/Plan:  1. PAF/flutter - typically with resting bradycardia - would leave him on his current regimen - his rate is controlled. Not terribly symptomatic  in my opinion but he is anxious to proceed with restoration to NSR. Will arrange for cardioversion, labs today, INR today. Update his echo. Needs to get back and see [name] to discuss his other options.   2. HTN - BP running low - he has felt presyncopal last week - stopping ARB today.   3. Chronic anticoagulation with coumadin - last several checks therapeutic - checking today as well.   4. Obesity   5. 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   Current medicines are reviewed with the patient today.  The patient does not have concerns regarding medicines other than what has been noted above.  The following changes have been made:  See above.  Labs/ tests ordered today include:    Orders Placed This Encounter  Procedures  . Basic metabolic panel  . CBC  . Protime-INR  . APTT  . TSH  . EKG 12-Lead  . ECHOCARDIOGRAM COMPLETE     Disposition:   Plan as above. Lab today. Echo to be updated. Arranging for cardioversion. EP visit arranged as well.    Patient is agreeable to this plan and will call if any problems develop in the interim.   SignedTruitt Merle, NP  05/27/2016 10:44 AM  [facility] [address] [place], [place]  [address] Phone: [phone] Fax: [phone]      Addendum: INR today by fingerstick today is 2.1. His venipuncture on Monday was 1.8 but fingerstick 2.1. Discussed by phone with Dr. Oval Linsey. She is agreeable to proceeding with cardioversion.   Burtis Junes, RN, [facility] [address] [place], [place]  [address] [phone]

## 2016-05-27 NOTE — Patient Instructions (Addendum)
We will be checking the following labs today - BMET, CBC, PT, PTT, TSH  Coumadin check today   Medication Instructions:    Continue with your current medicines. BUT  I am stopping Avapro    Testing/Procedures To Be Arranged:  Echocardiogram  Cardioversion  Follow-Up:   Needs OV with [name] (has seen before)    Other Special Instructions:   Restrict caffeine/chocolate/alcohol  Monitor your blood pressure for [place]    Your provider has recommended a cardioversion.   You are scheduled for a cardioversion on Wednesday, March 14th at 2pm with Dr. Oval Linsey or associates. Please go to [facility] [place] Stay at Wednesday, March 14th at 12:30PM.  Enter through the [place] not have any food or drink after midnight on Tuesday.  You may take your medicines with a sip of water on the day of your procedure.  You will need someone to drive you home following your procedure.   Call the [place] office at [phone] if you have any questions, problems or concerns.     Electrical Cardioversion Electrical cardioversion is the delivery of a jolt of electricity to change the rhythm of the heart. Sticky patches or metal paddles are placed on the chest to deliver the electricity from a device. This is done to restore a normal rhythm. A rhythm that is too fast or not regular keeps the heart from pumping well. Electrical cardioversion is done in an emergency if:   There is low or no blood pressure as a result of the heart rhythm.   Normal rhythm must be restored as fast as possible to protect the brain and heart from further damage.   It may save a life. Cardioversion may be done for heart rhythms that are not immediately life threatening, such as atrial fibrillation or flutter, in which:   The heart is beating too fast or is not regular.   Medicine to change the rhythm has not worked.   It is safe to wait in order  to allow time for preparation.  Symptoms of the abnormal rhythm are bothersome.  The risk of stroke and other serious problems can be reduced.  LET [place] CARE PROVIDER KNOW ABOUT:   Any allergies you have.  All medicines you are taking, including vitamins, herbs, eye drops, creams, and over-the-counter medicines.  Previous problems you or members of your family have had with the use of anesthetics.   Any blood disorders you have.   Previous surgeries you have had.   Medical conditions you have.   RISKS AND COMPLICATIONS  Generally, this is a safe procedure. However, problems can occur and include:   Breathing problems related to the anesthetic used.  A blood clot that breaks free and travels to other parts of your body. This could cause a stroke or other problems. The risk of this is lowered by use of blood-thinning medicine (anticoagulant) prior to the procedure.  Cardiac arrest (rare).   BEFORE THE PROCEDURE   You may have tests to detect blood clots in your heart and to evaluate heart function.  You may start taking anticoagulants so your blood does not clot as easily.   Medicines may be given to help stabilize your heart rate and rhythm.   PROCEDURE  You will be given medicine through an IV tube to reduce discomfort and make you sleepy (sedative).   An electrical shock will be delivered.   AFTER THE PROCEDURE  Your heart rhythm will be watched to make sure it does not change. You will need someone to drive you home.     If you need a refill on your cardiac medications before your next appointment, please call your pharmacy.   Call the [place] office at [phone] if you have any questions, problems or concerns.

## 2016-05-29 ENCOUNTER — Ambulatory Visit (HOSPITAL_COMMUNITY): Payer: PPO | Admitting: Anesthesiology

## 2016-05-29 ENCOUNTER — Encounter (HOSPITAL_COMMUNITY)
Admission: RE | Disposition: A | Discharge status: Home / Self Care | Payer: Self-pay | Source: Ambulatory Visit | Attending: Cardiovascular Disease

## 2016-05-29 ENCOUNTER — Encounter (HOSPITAL_COMMUNITY): Payer: Self-pay | Admitting: Cardiovascular Disease

## 2016-05-29 ENCOUNTER — Ambulatory Visit (INDEPENDENT_AMBULATORY_CARE_PROVIDER_SITE_OTHER): Payer: PPO | Admitting: *Deleted

## 2016-05-29 ENCOUNTER — Ambulatory Visit (HOSPITAL_COMMUNITY)
Admission: RE | Admit: 2016-05-29 | Discharge: 2016-05-29 | Disposition: A | Discharge status: Home / Self Care | Payer: PPO | Source: Ambulatory Visit | Attending: Cardiovascular Disease | Admitting: Cardiovascular Disease

## 2016-05-29 DIAGNOSIS — Z6829 Body mass index (BMI) 29.0-29.9, adult: Secondary | ICD-10-CM | POA: Insufficient documentation

## 2016-05-29 DIAGNOSIS — J302 Other seasonal allergic rhinitis: Secondary | ICD-10-CM | POA: Insufficient documentation

## 2016-05-29 DIAGNOSIS — E669 Obesity, unspecified: Secondary | ICD-10-CM | POA: Diagnosis not present

## 2016-05-29 DIAGNOSIS — G4730 Sleep apnea, unspecified: Secondary | ICD-10-CM | POA: Diagnosis not present

## 2016-05-29 DIAGNOSIS — I483 Typical atrial flutter: Secondary | ICD-10-CM | POA: Diagnosis not present

## 2016-05-29 DIAGNOSIS — Z7951 Long term (current) use of inhaled steroids: Secondary | ICD-10-CM | POA: Insufficient documentation

## 2016-05-29 DIAGNOSIS — Z79899 Other long term (current) drug therapy: Secondary | ICD-10-CM | POA: Insufficient documentation

## 2016-05-29 DIAGNOSIS — R002 Palpitations: Secondary | ICD-10-CM | POA: Insufficient documentation

## 2016-05-29 DIAGNOSIS — Z7901 Long term (current) use of anticoagulants: Secondary | ICD-10-CM | POA: Insufficient documentation

## 2016-05-29 DIAGNOSIS — M109 Gout, unspecified: Secondary | ICD-10-CM | POA: Insufficient documentation

## 2016-05-29 DIAGNOSIS — I10 Essential (primary) hypertension: Secondary | ICD-10-CM | POA: Diagnosis not present

## 2016-05-29 DIAGNOSIS — I4892 Unspecified atrial flutter: Secondary | ICD-10-CM | POA: Insufficient documentation

## 2016-05-29 DIAGNOSIS — Z8249 Family history of ischemic heart disease and other diseases of the circulatory system: Secondary | ICD-10-CM | POA: Insufficient documentation

## 2016-05-29 DIAGNOSIS — I480 Paroxysmal atrial fibrillation: Secondary | ICD-10-CM | POA: Insufficient documentation

## 2016-05-29 DIAGNOSIS — I739 Peripheral vascular disease, unspecified: Secondary | ICD-10-CM | POA: Insufficient documentation

## 2016-05-29 DIAGNOSIS — I4891 Unspecified atrial fibrillation: Secondary | ICD-10-CM

## 2016-05-29 DIAGNOSIS — K219 Gastro-esophageal reflux disease without esophagitis: Secondary | ICD-10-CM | POA: Insufficient documentation

## 2016-05-29 DIAGNOSIS — Z87891 Personal history of nicotine dependence: Secondary | ICD-10-CM | POA: Diagnosis not present

## 2016-05-29 HISTORY — PX: CARDIOVERSION: SHX1299

## 2016-05-29 LAB — BASIC METABOLIC PANEL
BUN/Creatinine Ratio: 14 (ref 10–24)
BUN: 20 mg/dL (ref 8–27)
CO2: 22 mmol/L (ref 18–29)
Calcium: 9.3 mg/dL (ref 8.6–10.2)
Chloride: 100 mmol/L (ref 96–106)
Creatinine, Ser: 1.43 mg/dL — ABNORMAL HIGH (ref 0.76–1.27)
GFR calc Af Amer: 57 mL/min/{1.73_m2} — ABNORMAL LOW (ref >59)
GFR calc non Af Amer: 49 mL/min/{1.73_m2} — ABNORMAL LOW (ref >59)
Glucose: 63 mg/dL — ABNORMAL LOW (ref 65–99)
Potassium: 5 mmol/L (ref 3.5–5.2)
Sodium: 140 mmol/L (ref 134–144)

## 2016-05-29 LAB — TSH: TSH: 3.4 u[IU]/mL (ref 0.450–4.500)

## 2016-05-29 LAB — CBC
Hematocrit: 45.6 % (ref 37.5–51.0)
Hemoglobin: 15.8 g/dL (ref 13.0–17.7)
MCH: 34.4 pg — ABNORMAL HIGH (ref 26.6–33.0)
MCHC: 34.6 g/dL (ref 31.5–35.7)
MCV: 99 fL — ABNORMAL HIGH (ref 79–97)
Platelets: 228 10*3/uL (ref 150–379)
RBC: 4.59 x10E6/uL (ref 4.14–5.80)
RDW: 14.7 % (ref 12.3–15.4)
WBC: 7.5 10*3/uL (ref 3.4–10.8)

## 2016-05-29 LAB — PROTIME-INR
INR: 1.8 — ABNORMAL HIGH (ref 0.8–1.2)
Prothrombin Time: 18 s — ABNORMAL HIGH (ref 9.1–12.0)

## 2016-05-29 LAB — APTT: aPTT: 34 s — ABNORMAL HIGH (ref 24–33)

## 2016-05-29 LAB — POCT INR: INR: 2.1

## 2016-05-29 SURGERY — CARDIOVERSION
Anesthesia: Monitor Anesthesia Care

## 2016-05-29 MED ORDER — LIDOCAINE HCL (CARDIAC) 20 MG/ML IV SOLN
INTRAVENOUS | Status: DC | PRN
  Administered 2016-05-29: 70 mg via INTRAVENOUS

## 2016-05-29 MED ORDER — PROPOFOL 10 MG/ML IV BOLUS
INTRAVENOUS | Status: DC | PRN
  Administered 2016-05-29: 70 mg via INTRAVENOUS

## 2016-05-29 MED ORDER — SODIUM CHLORIDE 0.9 % IV SOLN
INTRAVENOUS | Status: DC
  Administered 2016-05-29: 14:00:00 via INTRAVENOUS

## 2016-05-29 NOTE — Anesthesia Preprocedure Evaluation (Addendum)
Anesthesia Evaluation    Airway Mallampati: II  TM Distance: >3 FB Neck ROM: Full    Dental   Pulmonary asthma , sleep apnea , former smoker,    breath sounds clear to auscultation       Cardiovascular hypertension, + Cardiac Stents and + Peripheral Vascular Disease  + dysrhythmias Atrial Fibrillation  Rhythm:Irregular Rate:Normal     Neuro/Psych PSYCHIATRIC DISORDERS Depression    GI/Hepatic GERD (occasional)  Medicated,  Endo/Other    Renal/GU      Musculoskeletal   Abdominal   Peds  Hematology   Anesthesia Other Findings   Reproductive/Obstetrics                            Anesthesia Physical Anesthesia Plan  ASA: III  Anesthesia Plan: MAC and General   Post-op Pain Management:    Induction:   Airway Management Planned: Mask  Additional Equipment:   Intra-op Plan:   Post-operative Plan:   Informed Consent: I have reviewed the patients History and Physical, chart, labs and discussed the procedure including the risks, benefits and alternatives for the proposed anesthesia with the patient or authorized representative who has indicated his/her understanding and acceptance.   Dental advisory given  Plan Discussed with: CRNA  Anesthesia Plan Comments: (Echo 01/2011: Left ventricle: The cavity size was normal. There was moderate concentric hypertrophy. Systolic function was normal. The estimated ejection fraction was in the range of 60% to 65%. Wall motion was normal; there were no regional wall motion abnormalities. Left ventricular diastolic function parameters were normal. - Left atrium: The atrium was mildly dilated. Transthoracic echocardiography. M-mode, complete 2D, spectral Doppler, and color Doppler. Height: Height: 188cm. Height: 74in. Weight: Weight: 106kg. Weight: 233.2lb. Body mass index: BMI: 30kg/m^2. Body surface area:  BSA: 2.85m2. Blood  pressure:   104/71. Patient status: Inpatient. Location: Bedside.)       Anesthesia Quick Evaluation

## 2016-05-29 NOTE — CV Procedure (Signed)
Electrical Cardioversion Procedure Note Jonathan Vargas [id number] January 01, 1946  Procedure: Electrical Cardioversion Indications:  Atrial Flutter  Procedure Details Consent: Risks of procedure as well as the alternatives and risks of each were explained to the (patient/caregiver).  Consent for procedure obtained. Time Out: Verified patient identification, verified procedure, site/side was marked, verified correct patient position, special equipment/implants available, medications/allergies/relevent history reviewed, required imaging and test results available.  Performed  Patient placed on cardiac monitor, pulse oximetry, supplemental oxygen as necessary.  Sedation given: Propofol 70 mg Pacer pads placed anterior and posterior chest.  Cardioverted 1 time(s).  Cardioverted at 120J.  Evaluation Findings: Post procedure EKG shows: NSR Complications: None Patient did tolerate procedure well.   [name], MD 05/29/2016, 1:53 PM

## 2016-05-29 NOTE — Interval H&P Note (Signed)
History and Physical Interval Note:  05/29/2016 1:49 PM  Jonathan Vargas Para  has presented today for surgery, with the diagnosis of AFLUTTER  The various methods of treatment have been discussed with the patient and family. After consideration of risks, benefits and other options for treatment, the patient has consented to  Procedure(s): CARDIOVERSION (N/A) as a surgical intervention .  The patient's history has been reviewed, patient examined, no change in status, stable for surgery.  I have reviewed the patient's chart and labs.  Questions were answered to the patient's satisfaction.     [name], MD 05/29/2016 1:49 PM

## 2016-05-29 NOTE — Discharge Instructions (Signed)
Electrical Cardioversion, Care After °This sheet gives you information about how to care for yourself after your procedure. Your health care provider may also give you more specific instructions. If you have problems or questions, contact your health care provider. °What can I expect after the procedure? °After the procedure, it is common to have: °· Some redness on the skin where the shocks were given. °Follow these instructions at home: °· Do not drive for 24 hours if you were given a medicine to help you relax (sedative). °· Take over-the-counter and prescription medicines only as told by your health care provider. °· Ask your health care provider how to check your pulse. Check it often. °· Rest for 48 hours after the procedure or as told by your health care provider. °· Avoid or limit your caffeine use as told by your health care provider. °Contact a health care provider if: °· You feel like your heart is beating too quickly or your pulse is not regular. °· You have a serious muscle cramp that does not go away. °Get help right away if: °· You have discomfort in your chest. °· You are dizzy or you feel faint. °· You have trouble breathing or you are short of breath. °· Your speech is slurred. °· You have trouble moving an arm or leg on one side of your body. °· Your fingers or toes turn cold or blue. °This information is not intended to replace advice given to you by your health care provider. Make sure you discuss any questions you have with your health care provider. °Document Released: 12/23/2012 Document Revised: 10/06/2015 Document Reviewed: 09/08/2015 °Elsevier Interactive Patient Education © 2017 [employer] ° °

## 2016-05-29 NOTE — Anesthesia Postprocedure Evaluation (Signed)
Anesthesia Post Note  Patient: Jonathan Vargas  Procedure(s) Performed: Procedure(s) (LRB): CARDIOVERSION (N/A)  Patient location during evaluation: Endoscopy Anesthesia Type: MAC Level of consciousness: awake and alert Pain management: pain level controlled Vital Signs Assessment: post-procedure vital signs reviewed and stable Respiratory status: spontaneous breathing Cardiovascular status: stable Postop Assessment: no signs of nausea or vomiting Anesthetic complications: no       Last Vitals:  Vitals:   05/29/16 1355 05/29/16 1356  BP:  127/64  Pulse: (!) 59 63  Resp: 19 19  Temp:      Last Pain:  Vitals:   05/29/16 1356  TempSrc: Oral                 Jolly Mango

## 2016-05-29 NOTE — H&P (View-Only) (Signed)
CARDIOLOGY OFFICE NOTE  Date:  05/27/2016    Jonathan Vargas Date of Birth: 16-Dec-1945 Medical Record #[id number]  PCP:  Leeroy Cha, MD  Cardiologist:  [facility]  Chief Complaint  Patient presents with  . Atrial Fibrillation    Work in visit - seen for Dr. Marlou Porch    History of Present Illness: Jonathan Vargas is a 71 y.o. male who presents today for a work in visit. Seen for Dr. Marlou Porch.   He has a history of atrial fibrillation/flutter paroxysmal on antiarrhythmic and is on warfarin for his anticoagulation. Had stress test 09/01/12 that demonstrated a mild inferior wall defect, possible diaphragmatic attenuation, overall low risk study with normal ejection fraction. Other issues include HTN, prior carotid artery dissection in 1997, ongoing tobacco abuse, hemachromatosis. Previous hospitalization in November 2012 demonstrated paroxysmal atrial flutter which converted after 4 hours of diltiazem. He had a postconversion pause of 8.2 seconds, felt flushed.  He has seen [name] in the past - now following just prn. He has had some chronic palpitations since.   Last seen here back in January - rare palpitation noted but overall seemed to be at his baseline.   Phone call Saturday - "Pt of Dr. Marlou Porch with h/o PAF on Flecainide, Cardizem and Coumadin. Woke up around 2AM with tachy palpitations. HR was in the 150s. He took an extra dose of Flecainide. Rate improved but still with irregular pulse. No other symptoms. No CP, dyspnea, syncope/ near syncope. He reports full compliance with meds. He had 2 glasses of wine last night. He is now due for his morning meds, Cardizem and Flecainide. Pt advised to take and monitor symptoms. Avoid caffeine and ETOH. Call back if no improvement. Go to ED if hemodynamically unstable (pt notified of what to look for/ concerning symptoms). Pt also advised to call the office on Monday to set up an appointment with an APP if still with any issues, not  requiring immediate attention over the weekend. He verbalized understanding."  Thus added to my schedule for today.    Comes in today. Here alone. He notes he has been out of rhythm since the weekend - feels tired. Little chest discomfort associated. He has 2 glasses of wine every night. Had had 5 or 6 squares of dark chocolate as well. He feels the palpitations. No frank syncope reported. He has continued to work with a trainer to help with his weight loss. He is compliant with CPAP and medicines. BP is running low at home - was down in the 93'Z systolic last week - he felt presyncopal.   Past Medical History:  Diagnosis Date  . Acid reflux   . ASD (atrial septal defect)   . Asthma with allergic rhinitis and status asthmaticus   . Atrial fib/flutter, transient   . Chronic anticoagulation   . Depression   . ENT complaint    Dr. Erik Obey  . Erectile dysfunction   . FHx: allergies   . Gout    , Possible, Right Knee x2 ; intermittent milder episodes of pain in hte first MTP - uric acid 7.7- no treatment so far.  . Hemochromatosis   . Hypercholesteremia   . Hyperlipemia   . Hypertension   . Hypogonadism male   . Impaired fasting glucose   . 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  . Seasonal allergies   . Sleep apnea    After weight loss, off of  CPAP  . Trigger finger, left    , fourth finger    Past Surgical History:  Procedure Laterality Date  . COLONOSCOPY W/ POLYPECTOMY    . LIVER BIOPSY    . NASAL SINUS SURGERY  1997     Medications: Current Outpatient Prescriptions  Medication Sig Dispense Refill  . allopurinol (ZYLOPRIM) 300 MG tablet Take 300 mg by mouth daily.  1  . CALCIUM PO Take 200 mg by mouth 2 (two) times daily.    . cetirizine (ZYRTEC) 10 MG tablet Take 10 mg by mouth daily as needed for allergies.    . COLCRYS 0.6 MG tablet Take 0.6 mg by mouth 2 (two) times daily as needed (for gout).      Marland Kitchen diclofenac sodium (VOLTAREN) 1 % GEL Apply 1 application topically 4 (four) times daily as needed (for pain).   3  . diltiazem (CARDIZEM CD) 120 MG 24 hr capsule Take 1 capsule (120 mg total) by mouth daily. 90 capsule 2  . fexofenadine (ALLEGRA) 60 MG tablet Take 60 mg by mouth daily as needed. For allergies    . flecainide (TAMBOCOR) 50 MG tablet Take 1 tablet (50 mg total) by mouth 2 (two) times daily. 180 tablet 3  . fluticasone (FLONASE) 50 MCG/ACT nasal spray Place 1 spray into the nose daily.     . Misc Natural Products (LUTEIN 20) CAPS Take 40 mg by mouth 2 (two) times daily.     . NON FORMULARY CPAP    . PROAIR RESPICLICK 500 (90 Base) MCG/ACT AEPB Inhale 1 puff into the lungs daily as needed. Shortness of breath  1  . ranitidine (ZANTAC) 300 MG tablet Take 300 mg by mouth every evening.     . warfarin (COUMADIN) 5 MG tablet TAKE AS DIRECTED BY ANTICOAGULATION CLINIC 180 tablet 0   No current facility-administered medications for this visit.     Allergies: Allergies  Allergen Reactions  . Clindamycin/Lincomycin Diarrhea and Other (See Comments)    Stomach pain   . Amoxicillin Rash    Social History: The patient  reports that he quit smoking 2 days ago. He started smoking about 2 years ago. He has never used smokeless tobacco. He reports that he drinks alcohol. He reports that he does not use drugs.   Family History: The patient's family history includes Diabetes type II in his father and maternal grandfather; Heart disease in his mother; Stroke in his father.   Review of Systems: Please see the history of present illness.   Otherwise, the review of systems is positive for none.   All other systems are reviewed and negative.   Physical Exam: VS:  BP 100/60   Pulse 61   Ht 6\' 2"  (1.88 m)   Wt 232 lb (105.2 kg)   BMI 29.79 kg/m  .  BMI Body mass index is 29.79 kg/m.  Wt Readings from Last 3 Encounters:  05/27/16 232 lb (105.2 kg)  04/01/16 234 lb (106.1 kg)    03/25/16 237 lb 6.4 oz (107.7 kg)    General:  Obese male who is alert and in no acute distress. Looks older than his stated age.   HEENT: Normal.  Neck: Supple, no JVD, carotid bruits, or masses noted.  Cardiac: Irregular irregular rhythm. Rate is ok. No murmurs, rubs, or gallops. No edema.  Respiratory:  Lungs are coarse to auscultation bilaterally with normal work of breathing.  GI: Soft and nontender.  MS: No deformity or atrophy. Gait and ROM  intact.  Skin: Warm and dry. Color is normal.  Neuro:  Strength and sensation are intact and no gross focal deficits noted.  Psych: Alert, appropriate and with normal affect.   LABORATORY DATA:  EKG:  EKG is ordered today. This demonstrates atrial flutter 4:1 with VR of 61.  Lab Results  Component Value Date   WBC 4.7 01/25/2011   HGB 14.5 04/01/2016   HCT 44.1 01/25/2011   PLT 212 01/25/2011   GLUCOSE 123 (H) 01/25/2011   NA 141 01/25/2011   K 4.5 01/25/2011   CL 106 01/25/2011   CREATININE 1.14 01/25/2011   BUN 23 01/25/2011   CO2 27 01/25/2011   INR 2.0 04/29/2016   Lab Results  Component Value Date   INR 2.0 04/29/2016   INR 2.3 04/08/2016   INR 2.1 03/25/2016   BNP (last 3 results) No results for input(s): BNP in the last 8760 hours.  ProBNP (last 3 results) No results for input(s): PROBNP in the last 8760 hours.   Other Studies Reviewed Today:  Echo Study Conclusions from 2012  - Left ventricle: The cavity size was normal. There was moderate concentric hypertrophy. Systolic function was normal. The estimated ejection fraction was in the range of 60% to 65%. Wall motion was normal; there were no regional wall motion abnormalities. Left ventricular diastolic function parameters were normal. - Left atrium: The atrium was mildly dilated.   Assessment/Plan:  1. PAF/flutter - typically with resting bradycardia - would leave him on his current regimen - his rate is controlled. Not terribly symptomatic  in my opinion but he is anxious to proceed with restoration to NSR. Will arrange for cardioversion, labs today, INR today. Update his echo. Needs to get back and see [name] to discuss his other options.   2. HTN - BP running low - he has felt presyncopal last week - stopping ARB today.   3. Chronic anticoagulation with coumadin - last several checks therapeutic - checking today as well.   4. Obesity   5. 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   Current medicines are reviewed with the patient today.  The patient does not have concerns regarding medicines other than what has been noted above.  The following changes have been made:  See above.  Labs/ tests ordered today include:    Orders Placed This Encounter  Procedures  . Basic metabolic panel  . CBC  . Protime-INR  . APTT  . TSH  . EKG 12-Lead  . ECHOCARDIOGRAM COMPLETE     Disposition:   Plan as above. Lab today. Echo to be updated. Arranging for cardioversion. EP visit arranged as well.    Patient is agreeable to this plan and will call if any problems develop in the interim.   SignedTruitt Merle, NP  05/27/2016 10:44 AM  [facility] [address] [place], [place]  [address] Phone: [phone] Fax: [phone]      Addendum: INR today by fingerstick today is 2.1. His venipuncture on Monday was 1.8 but fingerstick 2.1. Discussed by phone with Dr. Oval Linsey. She is agreeable to proceeding with cardioversion.   Burtis Junes, RN, [facility] [address] [place], [place]  [address] [phone]

## 2016-05-30 ENCOUNTER — Encounter (HOSPITAL_COMMUNITY): Payer: Self-pay | Admitting: Cardiovascular Disease

## 2016-05-30 DIAGNOSIS — C4442 Squamous cell carcinoma of skin of scalp and neck: Secondary | ICD-10-CM | POA: Diagnosis not present

## 2016-05-30 DIAGNOSIS — L988 Other specified disorders of the skin and subcutaneous tissue: Secondary | ICD-10-CM | POA: Diagnosis not present

## 2016-05-30 DIAGNOSIS — Z85828 Personal history of other malignant neoplasm of skin: Secondary | ICD-10-CM | POA: Diagnosis not present

## 2016-05-30 NOTE — Transfer of Care (Signed)
Immediate Anesthesia Transfer of Care Note  Patient: Jonathan Vargas  Procedure(s) Performed: Procedure(s): CARDIOVERSION (N/A)  Patient Location: Endoscopy Unit  Anesthesia Type:General  Level of Consciousness: awake, alert , oriented, patient cooperative and responds to stimulation  Airway & Oxygen Therapy: Patient Spontanous Breathing and Patient connected to nasal cannula oxygen  Post-op Assessment: Report given to RN, Post -op Vital signs reviewed and stable and Patient moving all extremities X 4  Post vital signs: Reviewed and stable  Last Vitals:  Vitals:   05/29/16 1400 05/29/16 1410  BP: (!) 93/41 132/73  Pulse: 60 60  Resp: 19 19  Temp:      Last Pain:  Vitals:   05/29/16 1356  TempSrc: Oral         Complications: No apparent anesthesia complications

## 2016-05-31 ENCOUNTER — Telehealth: Payer: Self-pay | Admitting: Nurse Practitioner

## 2016-05-31 NOTE — Telephone Encounter (Signed)
Pt aware to continue to monitor blood pressure and c/b with any further concerns.

## 2016-05-31 NOTE — Telephone Encounter (Signed)
Called patient about his questions. Patient stated he feels better than he did yesterday, but still has low energy. Patient blamed the anastasia yesterday for his sluggishness. Informed patient that in some patient the Helyn App takes a while to wear off completely. Patient stated he has been taking his other medications as prescribed. Patient stated he took his BP while he was out at a drug store. Informed patient that he should take his BP daily to get an accurate readings over time. Informed patient that his Avapro was stopped due to his BP being so low at his office visit on 05/27/16. Encouraged patient to keep track of his BP over the weekend and if his energy level does not keep improving to give our office a call. Patient agree to plan and verbalized understanding. Will forward to Dr. Marlou Porch for further advisement.

## 2016-05-31 NOTE — Telephone Encounter (Signed)
Continue to monitor blood pressures. Candee Furbish, MD

## 2016-05-31 NOTE — Telephone Encounter (Signed)
Jonathan Vargas is calling because [name] took him off his blood pressure medication and when checked his bp on this morning it was 155/82 and he still not feeling up to par after the procedure on Wednesday afternoon . Please call

## 2016-06-05 ENCOUNTER — Ambulatory Visit (INDEPENDENT_AMBULATORY_CARE_PROVIDER_SITE_OTHER): Payer: PPO | Admitting: Pharmacist

## 2016-06-05 DIAGNOSIS — I4891 Unspecified atrial fibrillation: Secondary | ICD-10-CM | POA: Diagnosis not present

## 2016-06-05 DIAGNOSIS — I480 Paroxysmal atrial fibrillation: Secondary | ICD-10-CM | POA: Diagnosis not present

## 2016-06-05 LAB — POCT INR: INR: 2.1

## 2016-06-06 DIAGNOSIS — Z4802 Encounter for removal of sutures: Secondary | ICD-10-CM | POA: Diagnosis not present

## 2016-06-07 ENCOUNTER — Other Ambulatory Visit: Payer: Self-pay | Admitting: Cardiology

## 2016-06-10 DIAGNOSIS — S39012A Strain of muscle, fascia and tendon of lower back, initial encounter: Secondary | ICD-10-CM | POA: Diagnosis not present

## 2016-06-10 DIAGNOSIS — S29012A Strain of muscle and tendon of back wall of thorax, initial encounter: Secondary | ICD-10-CM | POA: Diagnosis not present

## 2016-06-10 DIAGNOSIS — M9905 Segmental and somatic dysfunction of pelvic region: Secondary | ICD-10-CM | POA: Diagnosis not present

## 2016-06-10 DIAGNOSIS — M5137 Other intervertebral disc degeneration, lumbosacral region: Secondary | ICD-10-CM | POA: Diagnosis not present

## 2016-06-10 DIAGNOSIS — M9902 Segmental and somatic dysfunction of thoracic region: Secondary | ICD-10-CM | POA: Diagnosis not present

## 2016-06-10 DIAGNOSIS — M9903 Segmental and somatic dysfunction of lumbar region: Secondary | ICD-10-CM | POA: Diagnosis not present

## 2016-06-12 ENCOUNTER — Other Ambulatory Visit: Payer: Self-pay

## 2016-06-12 ENCOUNTER — Ambulatory Visit (HOSPITAL_COMMUNITY): Payer: PPO | Attending: Cardiovascular Disease

## 2016-06-12 DIAGNOSIS — Z7901 Long term (current) use of anticoagulants: Secondary | ICD-10-CM | POA: Insufficient documentation

## 2016-06-12 DIAGNOSIS — I480 Paroxysmal atrial fibrillation: Secondary | ICD-10-CM | POA: Diagnosis not present

## 2016-06-12 DIAGNOSIS — Z720 Tobacco use: Secondary | ICD-10-CM

## 2016-06-12 DIAGNOSIS — I483 Typical atrial flutter: Secondary | ICD-10-CM

## 2016-06-13 DIAGNOSIS — S29012A Strain of muscle and tendon of back wall of thorax, initial encounter: Secondary | ICD-10-CM | POA: Diagnosis not present

## 2016-06-13 DIAGNOSIS — M9905 Segmental and somatic dysfunction of pelvic region: Secondary | ICD-10-CM | POA: Diagnosis not present

## 2016-06-13 DIAGNOSIS — M9903 Segmental and somatic dysfunction of lumbar region: Secondary | ICD-10-CM | POA: Diagnosis not present

## 2016-06-13 DIAGNOSIS — M5137 Other intervertebral disc degeneration, lumbosacral region: Secondary | ICD-10-CM | POA: Diagnosis not present

## 2016-06-13 DIAGNOSIS — S39012A Strain of muscle, fascia and tendon of lower back, initial encounter: Secondary | ICD-10-CM | POA: Diagnosis not present

## 2016-06-13 DIAGNOSIS — M9902 Segmental and somatic dysfunction of thoracic region: Secondary | ICD-10-CM | POA: Diagnosis not present

## 2016-06-17 DIAGNOSIS — M9902 Segmental and somatic dysfunction of thoracic region: Secondary | ICD-10-CM | POA: Diagnosis not present

## 2016-06-17 DIAGNOSIS — S39012A Strain of muscle, fascia and tendon of lower back, initial encounter: Secondary | ICD-10-CM | POA: Diagnosis not present

## 2016-06-17 DIAGNOSIS — M5137 Other intervertebral disc degeneration, lumbosacral region: Secondary | ICD-10-CM | POA: Diagnosis not present

## 2016-06-17 DIAGNOSIS — M9903 Segmental and somatic dysfunction of lumbar region: Secondary | ICD-10-CM | POA: Diagnosis not present

## 2016-06-17 DIAGNOSIS — M9905 Segmental and somatic dysfunction of pelvic region: Secondary | ICD-10-CM | POA: Diagnosis not present

## 2016-06-17 DIAGNOSIS — S29012A Strain of muscle and tendon of back wall of thorax, initial encounter: Secondary | ICD-10-CM | POA: Diagnosis not present

## 2016-06-18 ENCOUNTER — Encounter: Payer: Self-pay | Admitting: Internal Medicine

## 2016-06-20 DIAGNOSIS — M5137 Other intervertebral disc degeneration, lumbosacral region: Secondary | ICD-10-CM | POA: Diagnosis not present

## 2016-06-20 DIAGNOSIS — M9905 Segmental and somatic dysfunction of pelvic region: Secondary | ICD-10-CM | POA: Diagnosis not present

## 2016-06-20 DIAGNOSIS — M9903 Segmental and somatic dysfunction of lumbar region: Secondary | ICD-10-CM | POA: Diagnosis not present

## 2016-06-20 DIAGNOSIS — S39012A Strain of muscle, fascia and tendon of lower back, initial encounter: Secondary | ICD-10-CM | POA: Diagnosis not present

## 2016-06-20 DIAGNOSIS — S29012A Strain of muscle and tendon of back wall of thorax, initial encounter: Secondary | ICD-10-CM | POA: Diagnosis not present

## 2016-06-20 DIAGNOSIS — M9902 Segmental and somatic dysfunction of thoracic region: Secondary | ICD-10-CM | POA: Diagnosis not present

## 2016-06-27 DIAGNOSIS — S29012A Strain of muscle and tendon of back wall of thorax, initial encounter: Secondary | ICD-10-CM | POA: Diagnosis not present

## 2016-06-27 DIAGNOSIS — M9902 Segmental and somatic dysfunction of thoracic region: Secondary | ICD-10-CM | POA: Diagnosis not present

## 2016-06-27 DIAGNOSIS — M9905 Segmental and somatic dysfunction of pelvic region: Secondary | ICD-10-CM | POA: Diagnosis not present

## 2016-06-27 DIAGNOSIS — M5137 Other intervertebral disc degeneration, lumbosacral region: Secondary | ICD-10-CM | POA: Diagnosis not present

## 2016-06-27 DIAGNOSIS — M9903 Segmental and somatic dysfunction of lumbar region: Secondary | ICD-10-CM | POA: Diagnosis not present

## 2016-06-27 DIAGNOSIS — S39012A Strain of muscle, fascia and tendon of lower back, initial encounter: Secondary | ICD-10-CM | POA: Diagnosis not present

## 2016-07-01 DIAGNOSIS — R972 Elevated prostate specific antigen [PSA]: Secondary | ICD-10-CM | POA: Diagnosis not present

## 2016-07-01 DIAGNOSIS — N5201 Erectile dysfunction due to arterial insufficiency: Secondary | ICD-10-CM | POA: Diagnosis not present

## 2016-07-03 ENCOUNTER — Encounter: Payer: Self-pay | Admitting: Internal Medicine

## 2016-07-03 ENCOUNTER — Ambulatory Visit (INDEPENDENT_AMBULATORY_CARE_PROVIDER_SITE_OTHER): Payer: PPO | Admitting: Internal Medicine

## 2016-07-03 VITALS — BP 128/70 | HR 46 | Ht 74.0 in | Wt 236.6 lb

## 2016-07-03 DIAGNOSIS — I480 Paroxysmal atrial fibrillation: Secondary | ICD-10-CM

## 2016-07-03 DIAGNOSIS — I10 Essential (primary) hypertension: Secondary | ICD-10-CM

## 2016-07-03 DIAGNOSIS — M5137 Other intervertebral disc degeneration, lumbosacral region: Secondary | ICD-10-CM | POA: Diagnosis not present

## 2016-07-03 DIAGNOSIS — S29012A Strain of muscle and tendon of back wall of thorax, initial encounter: Secondary | ICD-10-CM | POA: Diagnosis not present

## 2016-07-03 DIAGNOSIS — S39012A Strain of muscle, fascia and tendon of lower back, initial encounter: Secondary | ICD-10-CM | POA: Diagnosis not present

## 2016-07-03 DIAGNOSIS — M9902 Segmental and somatic dysfunction of thoracic region: Secondary | ICD-10-CM | POA: Diagnosis not present

## 2016-07-03 DIAGNOSIS — M9903 Segmental and somatic dysfunction of lumbar region: Secondary | ICD-10-CM | POA: Diagnosis not present

## 2016-07-03 DIAGNOSIS — M9905 Segmental and somatic dysfunction of pelvic region: Secondary | ICD-10-CM | POA: Diagnosis not present

## 2016-07-03 MED ORDER — IRBESARTAN 75 MG PO TABS: 75.0000 mg | ORAL_TABLET | Freq: Every day | ORAL | 3 refills | Status: DC

## 2016-07-03 NOTE — Progress Notes (Signed)
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 I have not seen him since 2015.  He has done really well since with infrequent atrial arrhythmias.  He reports only rare (1-2 episodes) of tachypalpitations over the past 2 years, each lasting only a minute or two.  He did developed sustained atypical atrial flutter 3/18 for which he required cardioversion.  This was nocturnal in onset.    He works out with a Clinical research associate 3x a week and feels well doing so.   Today, he denies symptoms of chest pain, shortness of breath,  lower extremity edema, dizziness, presyncope, or syncope.  The patient is otherwise without complaint today.   Past Medical History:  Diagnosis Date  . Acid reflux   . ASD (atrial septal defect)   . Asthma with allergic rhinitis and status asthmaticus   . Atrial fib/flutter, transient   . Chronic anticoagulation   . Depression   . ENT complaint    Dr. Erik Vargas  . Erectile dysfunction   . FHx: allergies   . Gout    , Possible, Right Knee x2 ; intermittent milder episodes of pain in hte first MTP - uric acid 7.7- no treatment so far.  . Hemochromatosis   . Hypercholesteremia   . Hyperlipemia   . Hypertension   . Hypogonadism male   . Impaired fasting glucose   . 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  . Seasonal allergies   . Sleep apnea    After weight loss, off of CPAP  . Trigger finger, left    , fourth finger   Past Surgical History:  Procedure Laterality Date  . CARDIOVERSION N/A 05/29/2016   Procedure: CARDIOVERSION;  Surgeon: Jonathan Latch, MD;  Location: [facility] ENDOSCOPY;  Service: Cardiovascular;  Laterality: N/A;  . COLONOSCOPY W/ POLYPECTOMY    . LIVER BIOPSY    . NASAL SINUS SURGERY  1997    Current Outpatient Prescriptions  Medication Sig Dispense Refill  . allopurinol (ZYLOPRIM) 300 MG tablet Take 300 mg by  mouth daily.  1  . CALCIUM PO Take 200 mg by mouth 2 (two) times daily.    . cetirizine (ZYRTEC) 10 MG tablet Take 10 mg by mouth daily as needed for allergies.    . COLCRYS 0.6 MG tablet Take 0.6 mg by mouth 2 (two) times daily as needed (for gout).     Marland Kitchen diclofenac sodium (VOLTAREN) 1 % GEL Apply 1 application topically 4 (four) times daily as needed (for pain).   3  . diltiazem (CARDIZEM CD) 120 MG 24 hr capsule Take 1 capsule (120 mg total) by mouth daily. 90 capsule 2  . fexofenadine (ALLEGRA) 60 MG tablet Take 60 mg by mouth daily as needed. For allergies    . flecainide (TAMBOCOR) 50 MG tablet Take 1 tablet (50 mg total) by mouth 2 (two) times daily. 180 tablet 3  . fluticasone (FLONASE) 50 MCG/ACT nasal spray Place 1 spray into the nose daily.     Marland Kitchen MAGNESIUM PO Take 1 capsule by mouth daily.    . Misc Natural Products (LUTEIN 20) CAPS Take 40 mg by mouth 2 (two) times daily.     . NON FORMULARY CPAP    . PROAIR RESPICLICK 299 (90 Base) MCG/ACT AEPB Inhale 1 puff into the lungs daily as needed. Shortness of breath  1  . ranitidine (ZANTAC) 300 MG tablet Take  300 mg by mouth every evening.     . TURMERIC PO Take 1 capsule by mouth daily.    . vitamin B-12 (CYANOCOBALAMIN) 1000 MCG tablet Take 1,000 mcg by mouth daily.    Marland Kitchen warfarin (COUMADIN) 5 MG tablet TAKE AS DIRECTED BY ANTICOAGULATION CLINIC 200 tablet 0   No current facility-administered medications for this visit.     Physical Exam: Vitals:   07/03/16 1008  BP: 128/70  Pulse: (!) 46  SpO2: 97%  Weight: 236 lb 9.6 oz (107.3 kg)  Height: 6\' 2"  (1.88 m)    GEN- The patient is pleasant appearing, alert and oriented x 3 today.   Head- normocephalic, atraumatic Eyes-  Sclera clear, conjunctiva pink Ears- hearing intact Oropharynx- clear Lungs- Clear to ausculation bilaterally, normal work of breathing Heart- Regular rate and rhythm, no murmurs, rubs or gallops, PMI not laterally displaced GI- soft, NT, ND, +  BS Extremities- no clubbing, cyanosis, or edema  ekg today reveals sinus bradycardia at 46 bpm, with LAD  Echo is reviewed with patient today.  Preserved EF.  Mild atrial enlargement. Cardioversion/ cardiology APP notes are reviewed  Assessment and Plan:  1. Afib/ atrial flutter Doing reasonably well with flecainide.  Therapeutic strategies for afib including medicine (tikosyn) and ablation were discussed in detail with the patient today. Risk, benefits, and alternatives to EP study and radiofrequency ablation for afib were also discussed in detail today.  He is clear that he would prefer to continue current medical therapy.  Given the very infrequent nature of episodes,  I think this is reasonable. He is anticoagulated with coumadin.  2. Sinus bradycardia Asymptomatic I have discussed stopping diltiazem as an option.  He prefers to continue diltiazem at this time.  He will contact me if he develops symptomatic bradycardia  3. HTN He reports frequently elevated BP at the gym Will restart irbesartan but at half the dose (75mg  daily) Follow-up with Dr Jonathan Vargas as scheduled  Continue current plan given low AF burden F/u Dr. Marlou Vargas in 3 months I will see when needed going forward.  Jonathan Grayer MD, [facility] 07/03/2016 10:54 AM

## 2016-07-03 NOTE — Patient Instructions (Addendum)
Medication Instructions:  Your physician has recommended you make the following change in your medication:  1) restart Irbesartan 75 mg daily   Labwork: None ordered   Testing/Procedures: None ordered   Follow-Up: Your physician recommends that you schedule a follow-up appointment in July with Dr Marlou Porch for a 6 month follow up   Any Other Special Instructions Will Be Listed Below (If Applicable).     If you need a refill on your cardiac medications before your next appointment, please call your pharmacy.

## 2016-07-09 DIAGNOSIS — M5137 Other intervertebral disc degeneration, lumbosacral region: Secondary | ICD-10-CM | POA: Diagnosis not present

## 2016-07-09 DIAGNOSIS — M9905 Segmental and somatic dysfunction of pelvic region: Secondary | ICD-10-CM | POA: Diagnosis not present

## 2016-07-09 DIAGNOSIS — M9903 Segmental and somatic dysfunction of lumbar region: Secondary | ICD-10-CM | POA: Diagnosis not present

## 2016-07-09 DIAGNOSIS — S39012A Strain of muscle, fascia and tendon of lower back, initial encounter: Secondary | ICD-10-CM | POA: Diagnosis not present

## 2016-07-09 DIAGNOSIS — S29012A Strain of muscle and tendon of back wall of thorax, initial encounter: Secondary | ICD-10-CM | POA: Diagnosis not present

## 2016-07-09 DIAGNOSIS — M9902 Segmental and somatic dysfunction of thoracic region: Secondary | ICD-10-CM | POA: Diagnosis not present

## 2016-07-17 ENCOUNTER — Ambulatory Visit (INDEPENDENT_AMBULATORY_CARE_PROVIDER_SITE_OTHER): Payer: PPO | Admitting: *Deleted

## 2016-07-17 DIAGNOSIS — I4891 Unspecified atrial fibrillation: Secondary | ICD-10-CM

## 2016-07-17 LAB — POCT INR: INR: 2.5

## 2016-07-22 DIAGNOSIS — I10 Essential (primary) hypertension: Secondary | ICD-10-CM | POA: Diagnosis not present

## 2016-07-22 DIAGNOSIS — G4733 Obstructive sleep apnea (adult) (pediatric): Secondary | ICD-10-CM | POA: Diagnosis not present

## 2016-07-22 DIAGNOSIS — R972 Elevated prostate specific antigen [PSA]: Secondary | ICD-10-CM | POA: Diagnosis not present

## 2016-07-22 DIAGNOSIS — N529 Male erectile dysfunction, unspecified: Secondary | ICD-10-CM | POA: Diagnosis not present

## 2016-07-22 DIAGNOSIS — Z79899 Other long term (current) drug therapy: Secondary | ICD-10-CM | POA: Diagnosis not present

## 2016-07-22 DIAGNOSIS — Z6832 Body mass index (BMI) 32.0-32.9, adult: Secondary | ICD-10-CM | POA: Diagnosis not present

## 2016-07-22 DIAGNOSIS — I4891 Unspecified atrial fibrillation: Secondary | ICD-10-CM | POA: Diagnosis not present

## 2016-07-22 DIAGNOSIS — E669 Obesity, unspecified: Secondary | ICD-10-CM | POA: Diagnosis not present

## 2016-07-22 DIAGNOSIS — M150 Primary generalized (osteo)arthritis: Secondary | ICD-10-CM | POA: Diagnosis not present

## 2016-07-22 DIAGNOSIS — M1A09X0 Idiopathic chronic gout, multiple sites, without tophus (tophi): Secondary | ICD-10-CM | POA: Diagnosis not present

## 2016-07-23 DIAGNOSIS — M9903 Segmental and somatic dysfunction of lumbar region: Secondary | ICD-10-CM | POA: Diagnosis not present

## 2016-07-23 DIAGNOSIS — M9905 Segmental and somatic dysfunction of pelvic region: Secondary | ICD-10-CM | POA: Diagnosis not present

## 2016-07-23 DIAGNOSIS — S29012A Strain of muscle and tendon of back wall of thorax, initial encounter: Secondary | ICD-10-CM | POA: Diagnosis not present

## 2016-07-23 DIAGNOSIS — S39012A Strain of muscle, fascia and tendon of lower back, initial encounter: Secondary | ICD-10-CM | POA: Diagnosis not present

## 2016-07-23 DIAGNOSIS — M9902 Segmental and somatic dysfunction of thoracic region: Secondary | ICD-10-CM | POA: Diagnosis not present

## 2016-07-23 DIAGNOSIS — M5137 Other intervertebral disc degeneration, lumbosacral region: Secondary | ICD-10-CM | POA: Diagnosis not present

## 2016-07-29 DIAGNOSIS — M9905 Segmental and somatic dysfunction of pelvic region: Secondary | ICD-10-CM | POA: Diagnosis not present

## 2016-07-29 DIAGNOSIS — S29012A Strain of muscle and tendon of back wall of thorax, initial encounter: Secondary | ICD-10-CM | POA: Diagnosis not present

## 2016-07-29 DIAGNOSIS — M5137 Other intervertebral disc degeneration, lumbosacral region: Secondary | ICD-10-CM | POA: Diagnosis not present

## 2016-07-29 DIAGNOSIS — M9902 Segmental and somatic dysfunction of thoracic region: Secondary | ICD-10-CM | POA: Diagnosis not present

## 2016-07-29 DIAGNOSIS — M9903 Segmental and somatic dysfunction of lumbar region: Secondary | ICD-10-CM | POA: Diagnosis not present

## 2016-07-29 DIAGNOSIS — S39012A Strain of muscle, fascia and tendon of lower back, initial encounter: Secondary | ICD-10-CM | POA: Diagnosis not present

## 2016-08-05 DIAGNOSIS — M9902 Segmental and somatic dysfunction of thoracic region: Secondary | ICD-10-CM | POA: Diagnosis not present

## 2016-08-05 DIAGNOSIS — S39012A Strain of muscle, fascia and tendon of lower back, initial encounter: Secondary | ICD-10-CM | POA: Diagnosis not present

## 2016-08-05 DIAGNOSIS — M9903 Segmental and somatic dysfunction of lumbar region: Secondary | ICD-10-CM | POA: Diagnosis not present

## 2016-08-05 DIAGNOSIS — M5137 Other intervertebral disc degeneration, lumbosacral region: Secondary | ICD-10-CM | POA: Diagnosis not present

## 2016-08-05 DIAGNOSIS — S29012A Strain of muscle and tendon of back wall of thorax, initial encounter: Secondary | ICD-10-CM | POA: Diagnosis not present

## 2016-08-05 DIAGNOSIS — M9905 Segmental and somatic dysfunction of pelvic region: Secondary | ICD-10-CM | POA: Diagnosis not present

## 2016-08-14 DIAGNOSIS — J329 Chronic sinusitis, unspecified: Secondary | ICD-10-CM | POA: Diagnosis not present

## 2016-08-19 ENCOUNTER — Other Ambulatory Visit: Payer: Self-pay | Admitting: Gastroenterology

## 2016-08-19 DIAGNOSIS — Z86010 Personal history of colonic polyps: Secondary | ICD-10-CM | POA: Diagnosis not present

## 2016-08-19 DIAGNOSIS — Z800 Family history of malignant neoplasm of digestive organs: Secondary | ICD-10-CM | POA: Diagnosis not present

## 2016-08-22 ENCOUNTER — Telehealth: Payer: Self-pay | Admitting: Pharmacist

## 2016-08-22 NOTE — Telephone Encounter (Signed)
Received fax from [facility] that pt is scheduled for a colonoscopy on 09/26/16 with request to hold Coumadin prior. Pt takes Coumadin for afib with CHADS2 score of 1 (HTN). Ok to hold Coumadin for 5 days prior. Clearance faxed to [facility].

## 2016-08-25 ENCOUNTER — Other Ambulatory Visit: Payer: Self-pay | Admitting: Cardiology

## 2016-08-26 ENCOUNTER — Other Ambulatory Visit: Payer: Self-pay | Admitting: Gastroenterology

## 2016-08-28 ENCOUNTER — Ambulatory Visit (INDEPENDENT_AMBULATORY_CARE_PROVIDER_SITE_OTHER): Payer: PPO | Admitting: *Deleted

## 2016-08-28 DIAGNOSIS — M5137 Other intervertebral disc degeneration, lumbosacral region: Secondary | ICD-10-CM | POA: Diagnosis not present

## 2016-08-28 DIAGNOSIS — M9902 Segmental and somatic dysfunction of thoracic region: Secondary | ICD-10-CM | POA: Diagnosis not present

## 2016-08-28 DIAGNOSIS — M9905 Segmental and somatic dysfunction of pelvic region: Secondary | ICD-10-CM | POA: Diagnosis not present

## 2016-08-28 DIAGNOSIS — I4891 Unspecified atrial fibrillation: Secondary | ICD-10-CM | POA: Diagnosis not present

## 2016-08-28 DIAGNOSIS — S29012A Strain of muscle and tendon of back wall of thorax, initial encounter: Secondary | ICD-10-CM | POA: Diagnosis not present

## 2016-08-28 DIAGNOSIS — S39012A Strain of muscle, fascia and tendon of lower back, initial encounter: Secondary | ICD-10-CM | POA: Diagnosis not present

## 2016-08-28 DIAGNOSIS — M9903 Segmental and somatic dysfunction of lumbar region: Secondary | ICD-10-CM | POA: Diagnosis not present

## 2016-08-28 LAB — POCT INR: INR: 2.4

## 2016-08-28 NOTE — Patient Instructions (Signed)
09/19/16: Take your last dose   Resume Coumadin when instructed to do so by Physician & take an extra 1/2 tablet for 2 doses.

## 2016-09-23 ENCOUNTER — Other Ambulatory Visit: Payer: Self-pay | Admitting: Gastroenterology

## 2016-09-23 DIAGNOSIS — S29012A Strain of muscle and tendon of back wall of thorax, initial encounter: Secondary | ICD-10-CM | POA: Diagnosis not present

## 2016-09-23 DIAGNOSIS — M9902 Segmental and somatic dysfunction of thoracic region: Secondary | ICD-10-CM | POA: Diagnosis not present

## 2016-09-23 DIAGNOSIS — S39012A Strain of muscle, fascia and tendon of lower back, initial encounter: Secondary | ICD-10-CM | POA: Diagnosis not present

## 2016-09-23 DIAGNOSIS — M9903 Segmental and somatic dysfunction of lumbar region: Secondary | ICD-10-CM | POA: Diagnosis not present

## 2016-09-23 DIAGNOSIS — M5137 Other intervertebral disc degeneration, lumbosacral region: Secondary | ICD-10-CM | POA: Diagnosis not present

## 2016-09-23 DIAGNOSIS — M9905 Segmental and somatic dysfunction of pelvic region: Secondary | ICD-10-CM | POA: Diagnosis not present

## 2016-09-25 ENCOUNTER — Encounter (HOSPITAL_COMMUNITY): Payer: Self-pay | Admitting: *Deleted

## 2016-09-25 NOTE — Progress Notes (Signed)
Dr. Kalman Shan, Anesthesia, made aware that pt was instructed to stop Coumadin 5 days prior to procedure and pt saw cardiologist in April 2018 after cardioversion in March 2018; no new orders given.

## 2016-09-25 NOTE — Progress Notes (Signed)
Pt denies SOB and chest pain. Pt under the card of Dr. Marlou Porch, Cardiology. Pt denies having a cardiac cath. Pt stated that last dose of Coumadin was 09/20/16. Pt made aware to stop taking,vitamins, fish oil, Turmeric, Lutein and herbal medications. Do not take any NSAIDs ie: Ibuprofen, Advil, Naproxen (Aleve), Motrin, BC and Goody Powder or any medication containing Aspirin. Pt verbalized understanding of all pre-op instructions.

## 2016-09-26 ENCOUNTER — Telehealth: Payer: Self-pay | Admitting: *Deleted

## 2016-09-26 ENCOUNTER — Ambulatory Visit (HOSPITAL_COMMUNITY): Payer: PPO | Admitting: Critical Care Medicine

## 2016-09-26 ENCOUNTER — Ambulatory Visit (HOSPITAL_COMMUNITY)
Admission: RE | Admit: 2016-09-26 | Discharge: 2016-09-26 | Disposition: A | Discharge status: Home / Self Care | Payer: PPO | Source: Ambulatory Visit | Attending: Gastroenterology | Admitting: Gastroenterology

## 2016-09-26 ENCOUNTER — Encounter (HOSPITAL_COMMUNITY)
Admission: RE | Disposition: A | Discharge status: Home / Self Care | Payer: Self-pay | Source: Ambulatory Visit | Attending: Gastroenterology

## 2016-09-26 ENCOUNTER — Encounter (HOSPITAL_COMMUNITY): Payer: Self-pay | Admitting: Critical Care Medicine

## 2016-09-26 DIAGNOSIS — Q211 Atrial septal defect: Secondary | ICD-10-CM | POA: Diagnosis not present

## 2016-09-26 DIAGNOSIS — I10 Essential (primary) hypertension: Secondary | ICD-10-CM | POA: Diagnosis not present

## 2016-09-26 DIAGNOSIS — D123 Benign neoplasm of transverse colon: Secondary | ICD-10-CM | POA: Diagnosis not present

## 2016-09-26 DIAGNOSIS — I739 Peripheral vascular disease, unspecified: Secondary | ICD-10-CM | POA: Insufficient documentation

## 2016-09-26 DIAGNOSIS — Z860100 Personal history of colon polyps, unspecified: Secondary | ICD-10-CM

## 2016-09-26 DIAGNOSIS — Z1211 Encounter for screening for malignant neoplasm of colon: Secondary | ICD-10-CM | POA: Diagnosis not present

## 2016-09-26 DIAGNOSIS — Z79899 Other long term (current) drug therapy: Secondary | ICD-10-CM | POA: Diagnosis not present

## 2016-09-26 DIAGNOSIS — K219 Gastro-esophageal reflux disease without esophagitis: Secondary | ICD-10-CM | POA: Insufficient documentation

## 2016-09-26 DIAGNOSIS — G4730 Sleep apnea, unspecified: Secondary | ICD-10-CM | POA: Insufficient documentation

## 2016-09-26 DIAGNOSIS — M1990 Unspecified osteoarthritis, unspecified site: Secondary | ICD-10-CM | POA: Insufficient documentation

## 2016-09-26 DIAGNOSIS — J45909 Unspecified asthma, uncomplicated: Secondary | ICD-10-CM | POA: Insufficient documentation

## 2016-09-26 DIAGNOSIS — I4891 Unspecified atrial fibrillation: Secondary | ICD-10-CM | POA: Diagnosis not present

## 2016-09-26 DIAGNOSIS — F17200 Nicotine dependence, unspecified, uncomplicated: Secondary | ICD-10-CM | POA: Diagnosis not present

## 2016-09-26 DIAGNOSIS — R12 Heartburn: Secondary | ICD-10-CM | POA: Diagnosis not present

## 2016-09-26 DIAGNOSIS — Z800 Family history of malignant neoplasm of digestive organs: Secondary | ICD-10-CM | POA: Diagnosis not present

## 2016-09-26 DIAGNOSIS — K640 First degree hemorrhoids: Secondary | ICD-10-CM | POA: Insufficient documentation

## 2016-09-26 DIAGNOSIS — F329 Major depressive disorder, single episode, unspecified: Secondary | ICD-10-CM | POA: Insufficient documentation

## 2016-09-26 HISTORY — DX: Unspecified osteoarthritis, unspecified site: M19.90

## 2016-09-26 HISTORY — DX: Pneumonia, unspecified organism: J18.9

## 2016-09-26 HISTORY — DX: Malignant (primary) neoplasm, unspecified: C80.1

## 2016-09-26 HISTORY — DX: Personal history of urinary calculi: Z87.442

## 2016-09-26 HISTORY — PX: ESOPHAGOGASTRODUODENOSCOPY (EGD) WITH PROPOFOL: SHX5813

## 2016-09-26 HISTORY — PX: COLONOSCOPY WITH PROPOFOL: SHX5780

## 2016-09-26 HISTORY — DX: Fatty (change of) liver, not elsewhere classified: K76.0

## 2016-09-26 LAB — POCT I-STAT 4, (NA,K, GLUC, HGB,HCT)
GLUCOSE: 114 mg/dL — AB (ref 65–99)
HEMATOCRIT: 42 % (ref 39.0–52.0)
HEMOGLOBIN: 14.3 g/dL (ref 13.0–17.0)
POTASSIUM: 3.9 mmol/L (ref 3.5–5.1)
Sodium: 141 mmol/L (ref 135–145)

## 2016-09-26 LAB — PROTIME-INR
INR: 1.25
PROTHROMBIN TIME: 15.7 s — AB (ref 11.4–15.2)

## 2016-09-26 SURGERY — ESOPHAGOGASTRODUODENOSCOPY (EGD) WITH PROPOFOL
Anesthesia: Monitor Anesthesia Care

## 2016-09-26 SURGERY — COLONOSCOPY WITH PROPOFOL
Anesthesia: Monitor Anesthesia Care

## 2016-09-26 MED ORDER — PROPOFOL 10 MG/ML IV BOLUS
INTRAVENOUS | Status: DC | PRN
  Administered 2016-09-26 (×2): 10 mg via INTRAVENOUS

## 2016-09-26 MED ORDER — SODIUM CHLORIDE 0.9 % IV SOLN: INTRAVENOUS | Status: DC

## 2016-09-26 MED ORDER — EPHEDRINE SULFATE-NACL 50-0.9 MG/10ML-% IV SOSY
PREFILLED_SYRINGE | INTRAVENOUS | Status: DC | PRN
  Administered 2016-09-26 (×4): 5 mg via INTRAVENOUS

## 2016-09-26 MED ORDER — PROPOFOL 500 MG/50ML IV EMUL
INTRAVENOUS | Status: DC | PRN
  Administered 2016-09-26: 150 ug/kg/min via INTRAVENOUS

## 2016-09-26 MED ORDER — LACTATED RINGERS IV SOLN
INTRAVENOUS | Status: DC
  Administered 2016-09-26: 1000 mL via INTRAVENOUS

## 2016-09-26 MED ORDER — BUTAMBEN-TETRACAINE-BENZOCAINE 2-2-14 % EX AERO
INHALATION_SPRAY | CUTANEOUS | Status: DC | PRN
  Administered 2016-09-26: 2 via TOPICAL

## 2016-09-26 SURGICAL SUPPLY — 24 items

## 2016-09-26 NOTE — Op Note (Signed)
[facility] Patient Name: Jonathan Vargas Procedure Date : 09/26/2016 MRN: [id number] Attending MD: Lear Ng , MD Date of Birth: 1945/11/28 CSN: [id number] Age: 71 Admit Type: Outpatient Procedure:                Colonoscopy Indications:              Colon cancer screening in patient at increased                            risk: Colorectal cancer in father, Personal history                            of colonic polyps Providers:                Lear Ng, MD, Zenon Mayo, RN,                            William Dalton, Technician Referring MD:              Medicines:                Propofol per Anesthesia, Monitored Anesthesia Care Complications:            No immediate complications. Estimated Blood Loss:     Estimated blood loss: none. Procedure:                Pre-Anesthesia Assessment:                           - Prior to the procedure, a History and Physical                            was performed, and patient medications and                            allergies were reviewed. The patient's tolerance of                            previous anesthesia was also reviewed. The risks                            and benefits of the procedure and the sedation                            options and risks were discussed with the patient.                            All questions were answered, and informed consent                            was obtained. Prior Anticoagulants: The patient has                            taken Coumadin (warfarin), last dose was 5 days  prior to procedure. ASA Grade Assessment: III - A                            patient with severe systemic disease. After                            reviewing the risks and benefits, the patient was                            deemed in satisfactory condition to undergo the                            procedure.                           - Prior to the procedure, a History  and Physical                            was performed, and patient medications and                            allergies were reviewed. The patient's tolerance of                            previous anesthesia was also reviewed. The risks                            and benefits of the procedure and the sedation                            options and risks were discussed with the patient.                            All questions were answered, and informed consent                            was obtained. Prior Anticoagulants: The patient has                            taken Coumadin (warfarin), last dose was 5 days                            prior to procedure. ASA Grade Assessment: III - A                            patient with severe systemic disease. After                            reviewing the risks and benefits, the patient was                            deemed in satisfactory condition to undergo the  procedure.                           After obtaining informed consent, the colonoscope                            was passed under direct vision. Throughout the                            procedure, the patient's blood pressure, pulse, and                            oxygen saturations were monitored continuously. The                            EC-[id number] ([id number]) scope was introduced through                            the anus and advanced to the the cecum, identified                            by appendiceal orifice and ileocecal valve. The                            colonoscopy was performed without difficulty. The                            patient tolerated the procedure well. The quality                            of the bowel preparation was adequate and good. The                            ileocecal valve, appendiceal orifice, and rectum                            were photographed. Scope In: 9:40:05 AM Scope Out: 9:52:39 AM Scope Withdrawal Time: 0  hours 4 minutes 6 seconds  Total Procedure Duration: 0 hours 12 minutes 34 seconds  Findings:      The perianal and digital rectal examinations were normal.      A 5 mm polyp was found in the transverse colon. The polyp was       semi-pedunculated. The polyp was removed with a hot snare. Resection and       retrieval were complete. Estimated blood loss: none.      Internal hemorrhoids were found during retroflexion. The hemorrhoids       were small and Grade I (internal hemorrhoids that do not prolapse).      The exam was otherwise normal throughout the examined colon. Impression:               - One 5 mm polyp in the transverse colon, removed                            with a hot snare. Resected and retrieved.                           -  Internal hemorrhoids. Moderate Sedation:      N/A - MAC procedure Recommendation:           - Await pathology results.                           - Resume Coumadin (warfarin) at prior dose in 3                            days. Refer to managing physician for further                            adjustment of therapy.                           - Resume previous diet.                           - Repeat colonoscopy for surveillance based on                            pathology results.                           - Patient has a contact number available for                            emergencies. The signs and symptoms of potential                            delayed complications were discussed with the                            patient. Return to normal activities tomorrow.                            Written discharge instructions were provided to the                            patient. Procedure Code(s):        --- Professional ---                           [phone], Colonoscopy, flexible; with removal of                            tumor(s), polyp(s), or other lesion(s) by snare                            technique Diagnosis Code(s):        --- Professional  ---                           Z80.0, Family history of malignant neoplasm of                            digestive organs  D12.3, Benign neoplasm of transverse colon (hepatic                            flexure or splenic flexure)                           Z86.010, Personal history of colonic polyps                           K64.0, First degree hemorrhoids CPT copyright 2016 [employer]. All rights reserved. The codes documented in this report are preliminary and upon coder review may  be revised to meet current compliance requirements. Lear Ng, MD 09/26/2016 10:09:24 AM This report has been signed electronically. Number of Addenda: 0

## 2016-09-26 NOTE — Transfer of Care (Signed)
Immediate Anesthesia Transfer of Care Note  Patient: Jonathan Vargas  Procedure(s) Performed: Procedure(s): COLONOSCOPY WITH PROPOFOL (N/A) ESOPHAGOGASTRODUODENOSCOPY (EGD) WITH PROPOFOL (N/A)  Patient Location: Endoscopy Unit  Anesthesia Type:MAC  Level of Consciousness: awake, alert  and oriented  Airway & Oxygen Therapy: Patient Spontanous Breathing and Patient connected to nasal cannula oxygen  Post-op Assessment: Report given to RN and Post -op Vital signs reviewed and stable  Post vital signs: Reviewed and stable  Last Vitals:  Vitals:   09/26/16 0743 09/26/16 1003  BP: (!) 150/74 (!) 97/45  Pulse: (!) 46 75  Resp: 14 20  Temp: 36.7 C     Last Pain:  Vitals:   09/26/16 1003  TempSrc: Oral         Complications: No apparent anesthesia complications

## 2016-09-26 NOTE — Discharge Instructions (Signed)
YOU HAD AN ENDOSCOPIC PROCEDURE TODAY: Refer to the procedure report and other information in the discharge instructions given to you for any specific questions about what was found during the examination. If this information does not answer your questions, please call [facility] office at [phone] to clarify.   YOU SHOULD EXPECT: Some feelings of bloating in the abdomen. Passage of more gas than usual. Walking can help get rid of the air that was put into your GI tract during the procedure and reduce the bloating. If you had a lower endoscopy (such as a colonoscopy or flexible sigmoidoscopy) you may notice spotting of blood in your stool or on the toilet paper. Some abdominal soreness may be present for a day or two, also.  DIET: Your first meal following the procedure should be a light meal and then it is ok to progress to your normal diet. A half-sandwich or bowl of soup is an example of a good first meal. Heavy or fried foods are harder to digest and may make you feel nauseous or bloated. Drink plenty of fluids but you should avoid alcoholic beverages for 24 hours. If you had a esophageal dilation, please see attached instructions for diet.   ACTIVITY: Your care partner should take you home directly after the procedure. You should plan to take it easy, moving slowly for the rest of the day. You can resume normal activity the day after the procedure however YOU SHOULD NOT DRIVE, use power tools, machinery or perform tasks that involve climbing or major physical exertion for 24 hours (because of the sedation medicines used during the test).   SYMPTOMS TO REPORT IMMEDIATELY: A gastroenterologist can be reached at any hour. Please call [phone]  for any of the following symptoms:  Following lower endoscopy (colonoscopy, flexible sigmoidoscopy) Excessive amounts of blood in the stool  Significant tenderness, worsening of abdominal pains  Swelling of the abdomen that is new, acute  Fever of 100 or  higher  Following upper endoscopy (EGD, EUS, ERCP, esophageal dilation) Vomiting of blood or coffee ground material  New, significant abdominal pain  New, significant chest pain or pain under the shoulder blades  Painful or persistently difficult swallowing  New shortness of breath  Black, tarry-looking or red, bloody stools  FOLLOW UP:  If any biopsies were taken you will be contacted by phone or by letter within the next 1-3 weeks. Call [phone]  if you have not heard about the biopsies in 3 weeks.  Please also call with any specific questions about appointments or follow up tests.  HOLD COUMADIN FOR ANOTHER 3 DAYS (Resume Coumadin on 09/29/16).

## 2016-09-26 NOTE — Anesthesia Postprocedure Evaluation (Signed)
Anesthesia Post Note  Patient: Jonathan Vargas  Procedure(s) Performed: Procedure(s) (LRB): COLONOSCOPY WITH PROPOFOL (N/A) ESOPHAGOGASTRODUODENOSCOPY (EGD) WITH PROPOFOL (N/A)     Patient location during evaluation: PACU Anesthesia Type: MAC Level of consciousness: awake and alert Pain management: pain level controlled Vital Signs Assessment: post-procedure vital signs reviewed and stable Respiratory status: spontaneous breathing, nonlabored ventilation and respiratory function stable Cardiovascular status: stable and blood pressure returned to baseline Anesthetic complications: no    Last Vitals:  Vitals:   09/26/16 1010 09/26/16 1020  BP: (!) 102/55 (!) 126/55  Pulse: 62 (!) 54  Resp: 18 13  Temp:      Last Pain:  Vitals:   09/26/16 1003  TempSrc: Oral                 Fabiha Rougeau,W. EDMOND

## 2016-09-26 NOTE — Anesthesia Procedure Notes (Signed)
Procedure Name: MAC Date/Time: 09/26/2016 9:20 AM Performed by: Merrilyn Puma B Pre-anesthesia Checklist: Patient identified, Emergency Drugs available, Suction available, Patient being monitored and Timeout performed Patient Re-evaluated:Patient Re-evaluated prior to induction Oxygen Delivery Method: Nasal cannula Placement Confirmation: positive ETCO2 and breath sounds checked- equal and bilateral Dental Injury: Teeth and Oropharynx as per pre-operative assessment

## 2016-09-26 NOTE — Telephone Encounter (Signed)
Pt called & left a message stating he had his Colonoscopy completed today & was told to not to resume Coumadin until Sunday 09/29/16.  Pt states he has 1 polyp removed today

## 2016-09-26 NOTE — H&P (Signed)
Date of Initial H&P: 09/20/16  History reviewed, patient examined, no change in status, stable for surgery.

## 2016-09-26 NOTE — Op Note (Signed)
[facility] Patient Name: Jonathan Vargas Procedure Date : 09/26/2016 MRN: [id number] Attending MD: Lear Ng , MD Date of Birth: December 26, 1945 CSN: [id number] Age: 71 Admit Type: Outpatient Procedure:                Upper GI endoscopy Indications:              Heartburn, Esophageal reflux Providers:                Lear Ng, MD, Zenon Mayo, RN,                            William Dalton, Technician Referring MD:              Medicines:                Propofol per Anesthesia, Monitored Anesthesia Care Complications:            No immediate complications. Estimated Blood Loss:     Estimated blood loss: none. Procedure:                Pre-Anesthesia Assessment:                           - Prior to the procedure, a History and Physical                            was performed, and patient medications and                            allergies were reviewed. The patient's tolerance of                            previous anesthesia was also reviewed. The risks                            and benefits of the procedure and the sedation                            options and risks were discussed with the patient.                            All questions were answered, and informed consent                            was obtained. Prior Anticoagulants: The patient has                            taken Coumadin (warfarin), last dose was 5 days                            prior to procedure. ASA Grade Assessment: III - A                            patient with severe systemic disease. After  reviewing the risks and benefits, the patient was                            deemed in satisfactory condition to undergo the                            procedure.                           After obtaining informed consent, the endoscope was                            passed under direct vision. Throughout the                            procedure, the  patient's blood pressure, pulse, and                            oxygen saturations were monitored continuously. The                            [id number] ([id number]) scope was introduced through the                            mouth, and advanced to the second part of duodenum.                            The upper GI endoscopy was accomplished without                            difficulty. The patient tolerated the procedure                            well. Scope In: Scope Out: Findings:      The examined esophagus was normal.      The Z-line was regular and was found 42 cm from the incisors.      The entire examined stomach was normal.      The cardia and gastric fundus were normal on retroflexion.      The examined duodenum was normal. Impression:               - Normal esophagus.                           - Z-line regular, 42 cm from the incisors.                           - Normal stomach.                           - Normal examined duodenum.                           - No specimens collected. Moderate Sedation:      N/A - MAC procedure Recommendation:           -  Patient has a contact number available for                            emergencies. The signs and symptoms of potential                            delayed complications were discussed with the                            patient. Return to normal activities tomorrow.                            Written discharge instructions were provided to the                            patient.                           - Follow an antireflux regimen.                           - Post procedure medication orders were given. Procedure Code(s):        --- Professional ---                           [phone], Esophagogastroduodenoscopy, flexible,                            transoral; diagnostic, including collection of                            specimen(s) by brushing or washing, when performed                            (separate  procedure) Diagnosis Code(s):        --- Professional ---                           K21.9, Gastro-esophageal reflux disease without                            esophagitis                           R12, Heartburn CPT copyright 2016 American Medical Association. All rights reserved. The codes documented in this report are preliminary and upon coder review may  be revised to meet current compliance requirements. Lear Ng, MD 09/26/2016 10:03:09 AM This report has been signed electronically. Number of Addenda: 0

## 2016-09-26 NOTE — Anesthesia Preprocedure Evaluation (Addendum)
Anesthesia Evaluation  Patient identified by MRN, date of birth, ID band Patient awake    Reviewed: Allergy & Precautions, H&P , NPO status , Patient's Chart, lab work & pertinent test results  Airway Mallampati: II  TM Distance: >3 FB Neck ROM: Full    Dental no notable dental hx. (+) Teeth Intact, Dental Advisory Given   Pulmonary asthma , sleep apnea and Continuous Positive Airway Pressure Ventilation , Current Smoker,    Pulmonary exam normal breath sounds clear to auscultation       Cardiovascular hypertension, Pt. on medications + Peripheral Vascular Disease  + dysrhythmias Atrial Fibrillation  Rhythm:Regular Rate:Normal  ASD   Neuro/Psych Depression negative neurological ROS  negative psych ROS   GI/Hepatic Neg liver ROS, GERD  Medicated and Controlled,  Endo/Other  negative endocrine ROS  Renal/GU negative Renal ROS  negative genitourinary   Musculoskeletal  (+) Arthritis , Osteoarthritis,    Abdominal   Peds  Hematology negative hematology ROS (+)   Anesthesia Other Findings   Reproductive/Obstetrics negative OB ROS                            Anesthesia Physical Anesthesia Plan  ASA: III  Anesthesia Plan: MAC   Post-op Pain Management:    Induction: Intravenous  PONV Risk Score and Plan: 0 and Propofol  Airway Management Planned: Nasal Cannula  Additional Equipment:   Intra-op Plan:   Post-operative Plan:   Informed Consent: I have reviewed the patients History and Physical, chart, labs and discussed the procedure including the risks, benefits and alternatives for the proposed anesthesia with the patient or authorized representative who has indicated his/her understanding and acceptance.   Dental advisory given  Plan Discussed with: CRNA  Anesthesia Plan Comments:         Anesthesia Quick Evaluation

## 2016-09-26 NOTE — Interval H&P Note (Signed)
History and Physical Interval Note:  09/26/2016 8:54 AM  Jonathan Vargas  has presented today for surgery, with the diagnosis of History of colon polyps  The various methods of treatment have been discussed with the patient and family. After consideration of risks, benefits and other options for treatment, the patient has consented to  Procedure(s): COLONOSCOPY WITH PROPOFOL (N/A) ESOPHAGOGASTRODUODENOSCOPY (EGD) WITH PROPOFOL (N/A) as a surgical intervention .  The patient's history has been reviewed, patient examined, no change in status, stable for surgery.  I have reviewed the patient's chart and labs.  Questions were answered to the patient's satisfaction.     [place] C.

## 2016-09-30 ENCOUNTER — Encounter: Payer: Self-pay | Admitting: Cardiology

## 2016-09-30 ENCOUNTER — Ambulatory Visit (INDEPENDENT_AMBULATORY_CARE_PROVIDER_SITE_OTHER): Payer: PPO | Admitting: Cardiology

## 2016-09-30 ENCOUNTER — Telehealth: Payer: Self-pay | Admitting: *Deleted

## 2016-09-30 VITALS — BP 118/68 | HR 60 | Ht 74.0 in | Wt 234.8 lb

## 2016-09-30 DIAGNOSIS — G4730 Sleep apnea, unspecified: Secondary | ICD-10-CM

## 2016-09-30 DIAGNOSIS — I480 Paroxysmal atrial fibrillation: Secondary | ICD-10-CM

## 2016-09-30 DIAGNOSIS — M9902 Segmental and somatic dysfunction of thoracic region: Secondary | ICD-10-CM | POA: Diagnosis not present

## 2016-09-30 DIAGNOSIS — I4892 Unspecified atrial flutter: Secondary | ICD-10-CM

## 2016-09-30 DIAGNOSIS — M9903 Segmental and somatic dysfunction of lumbar region: Secondary | ICD-10-CM | POA: Diagnosis not present

## 2016-09-30 DIAGNOSIS — M9905 Segmental and somatic dysfunction of pelvic region: Secondary | ICD-10-CM | POA: Diagnosis not present

## 2016-09-30 DIAGNOSIS — S29012A Strain of muscle and tendon of back wall of thorax, initial encounter: Secondary | ICD-10-CM | POA: Diagnosis not present

## 2016-09-30 DIAGNOSIS — M5137 Other intervertebral disc degeneration, lumbosacral region: Secondary | ICD-10-CM | POA: Diagnosis not present

## 2016-09-30 DIAGNOSIS — S39012A Strain of muscle, fascia and tendon of lower back, initial encounter: Secondary | ICD-10-CM | POA: Diagnosis not present

## 2016-09-30 NOTE — Telephone Encounter (Addendum)
Pt left a voicemail on [facility] number stating that he spoke with his GI doctor & there has been a change in when he restarts his Coumadin. He states he started his Coumadin back on 09/27/16 per GI MD.  He would like a call back regarding this to see if appt needs to be changed again since resuming Coumadin earlier.  Returned a call to the pt regarding this matter & had to leave a message on his voicemail.

## 2016-09-30 NOTE — Telephone Encounter (Signed)
Spoke with patient. He did resume Coumadin on 09/27/16. Will keep his appt as scheduled on 10/07/16 for recheck. He states he appreciates call back.

## 2016-09-30 NOTE — Patient Instructions (Signed)

## 2016-09-30 NOTE — Progress Notes (Signed)
Jonathan Vargas. [address] [place], [place]  [address] Phone: [phone] Fax:  [phone]  Date:  09/30/2016   ID:  Jonathan Vargas, DOB 11/10/1945, MRN [id number]  PCP:  Jonathan Cha, MD  EP: Dr. Rayann Vargas  History of Present Illness: Jonathan Vargas is a 71 y.o. male with atrial fibrillation/flutter paroxysmal on antiarrhythmic, warfarin, diltiazem, anticoagulation here for followup.  . He had a cardioversion in March 2018. Overall is been doing quite well.  When he saw Dr. Rayann Vargas last in April 2018, his Avapro was restarted because he was having some high blood pressures at the gym. Continue to monitor. No chest pain, no shortness of breath, no syncope, no bleeding.  Had stress test 09/01/12 that demonstrated a mild inferior wall defect, possible diaphragmatic attenuation, overall low risk study with normal ejection fraction. Hemachromatosis.   Previous hospitalization in November 2012 demonstrated paroxysmal atrial flutter which converted after 4 hours of diltiazem. He had a postconversion pause of 8.2 seconds, felt flushed.   In early June of 2014, he began to feel some chest discomfort while walking, exertional down both arms that was relieved with rest. Noted when on treadmill when warming up. Had one experiecne when walking up stairs in a parking lot after a dinner show. Had 1/2 bottle of wine. This prompted stress test. No evidence of arrhythmia on treadmill. Worked out since then. Fleeting 4-5 min down both arms. Hypertension is well controlled.   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 Smoking cessation counseling.  Previously he has had a few palpitations, like heart stops for a split second and one second of pain then goes away. he had a good week this week but previously has felt occasional fluttering. At one point he felt his carotid and he felt as though he was in atrial fibrillation and he coughed and he" went out". No syncope, no  dizziness.  03/25/14-overall feeling fairly well. I've reviewed office notes from Dr. Rayann Vargas who had last visit with him on 12/27/13. Event monitor at that time showed no evidence of atrial fibrillation. He will see him as needed going forward. Agrees with flecainide.  09/30/14 - felt lilke chest was locked up last night, like a spasm. Eased off quickly. Had this maybe years ago. Noted CPAP, apnea at times. Dr. Maxwell Vargas. Getting cataracts done.   03/29/15-overall doing well. No recent hospitalizations. Atrial fibrillation under good control. Anticoagulation Works out with Clinical research associate. He has not noticed any significant palpitations. No recent episodes of A. fib. No chest pain, no fevers, no chills, no orthopnea, no PND, no syncope  09/25/15-working out with personal trainer. Occasionally he will feel skipping heartbeat. Rarely tachycardic. Most the time he has a sensation of a PAC every fifth or sixth beat at times. He notices compensatory pause. No syncope. No chest pain. Rare sensation below left breast.    Wt Readings from Last 3 Encounters:  09/30/16 234 lb 12.8 oz (106.5 kg)  09/26/16 236 lb (107 kg)  07/03/16 236 lb 9.6 oz (107.3 kg)     Past Medical History:  Diagnosis Date  . Acid reflux   . Arthritis   . ASD (atrial septal defect)   . Asthma with allergic rhinitis and status asthmaticus   . Atrial fib/flutter, transient   . Cancer ([place])    skin  . Chronic anticoagulation   . Depression   . ENT complaint    Dr. Erik Vargas  . Erectile dysfunction   . Fatty  liver   . FHx: allergies   . Gout    , Possible, Right Knee x2 ; intermittent milder episodes of pain in hte first MTP - uric acid 7.7- no treatment so far.  . Hemochromatosis   . History of kidney stones   . Hypercholesteremia   . Hyperlipemia   . Hypertension   . Hypogonadism male   . Impaired fasting glucose   . 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   . Pneumonia    " walking PNA > 40 years ago"  . Seasonal allergies   . Sleep apnea    wears CPAP ( set at 4-11)  . Trigger finger, left    , fourth finger    Past Surgical History:  Procedure Laterality Date  . CARDIOVERSION N/A 05/29/2016   Procedure: CARDIOVERSION;  Surgeon: Jonathan Latch, MD;  Location: [facility];  Service: Cardiovascular;  Laterality: N/A;  . CATARACT EXTRACTION W/ INTRAOCULAR LENS  IMPLANT, BILATERAL    . COLONOSCOPY W/ POLYPECTOMY    . COLONOSCOPY WITH PROPOFOL N/A 09/26/2016   Procedure: COLONOSCOPY WITH PROPOFOL;  Surgeon: Jonathan Corner, MD;  Location: [facility];  Service: Endoscopy;  Laterality: N/A;  . ESOPHAGOGASTRODUODENOSCOPY (EGD) WITH PROPOFOL N/A 09/26/2016   Procedure: ESOPHAGOGASTRODUODENOSCOPY (EGD) WITH PROPOFOL;  Surgeon: Jonathan Corner, MD;  Location: [facility];  Service: Endoscopy;  Laterality: N/A;  . LIVER BIOPSY    . NASAL SINUS SURGERY  1997    Current Outpatient Prescriptions  Medication Sig Dispense Refill  . allopurinol (ZYLOPRIM) 300 MG tablet Take 300 mg by mouth daily.  1  . CALCIUM PO Take 200 mg by mouth 2 (two) times daily.    . cetirizine (ZYRTEC) 10 MG tablet Take 10 mg by mouth daily as needed for allergies.    . COLCRYS 0.6 MG tablet Take 0.6 mg by mouth 2 (two) times daily as needed (for gout).     Marland Kitchen diclofenac sodium (VOLTAREN) 1 % GEL Apply 1 application topically 4 (four) times daily as needed (for pain).   3  . diltiazem (CARDIZEM CD) 120 MG 24 hr capsule Take 1 capsule (120 mg total) by mouth daily. 90 capsule 2  . fexofenadine (ALLEGRA) 60 MG tablet Take 60 mg by mouth daily as needed. For allergies    . flecainide (TAMBOCOR) 50 MG tablet Take 1 tablet (50 mg total) by mouth 2 (two) times daily. 180 tablet 3  . fluticasone (FLONASE) 50 MCG/ACT nasal spray Place 1 spray into the nose daily.     . irbesartan (AVAPRO) 75 MG tablet Patient takes 1/2 tablet by mouth daily.  3  . MAGNESIUM PO Take 1 capsule  by mouth daily.    . Misc Natural Products (LUTEIN 20) CAPS Take 40 mg by mouth 2 (two) times daily.     . NON FORMULARY CPAP    . PROAIR RESPICLICK 694 (90 Base) MCG/ACT AEPB Inhale 1 puff into the lungs daily as needed. Shortness of breath  1  . ranitidine (ZANTAC) 300 MG tablet Take 300 mg by mouth every evening.     . TURMERIC PO Take 1 capsule by mouth daily.    . vitamin B-12 (CYANOCOBALAMIN) 1000 MCG tablet Take 1,000 mcg by mouth daily.    Marland Kitchen warfarin (COUMADIN) 5 MG tablet TAKE AS DIRECTED BY ANTICOAGULATION CLINIC 200 tablet 0   No current facility-administered medications for this visit.     Allergies:    Allergies  Allergen Reactions  .  Clindamycin/Lincomycin Diarrhea and Other (See Comments)    Stomach pain   . Amoxicillin Rash    Social History:  The patient  reports that he has been smoking Cigarettes.  He started smoking about 2 years ago. He has been smoking about 0.25 packs per day. He has quit using smokeless tobacco. His smokeless tobacco use included Chew. He reports that he drinks alcohol. He reports that he does not use drugs.   ROS:  Please see the history of present illness.   Denies any fevers, chills, orthopnea, PND    PHYSICAL EXAM: VS:  BP 118/68   Pulse 60   Ht 6\' 2"  (1.88 m)   Wt 234 lb 12.8 oz (106.5 kg)   LMP  (LMP Unknown)   BMI 30.15 kg/m  GEN: Well nourished, well developed, in no acute distress  HEENT: normal  Neck: no JVD, carotid bruits, or masses Cardiac: RRR; no murmurs, rubs, or gallops,no edema  Respiratory:  clear to auscultation bilaterally, normal work of breathing GI: soft, nontender, nondistended, + BS MS: no deformity or atrophy  Skin: warm and dry, no rash Neuro:  Alert and Oriented x 3, Strength and sensation are intact Psych: euthymic mood, full affect   EKG:  03/29/15-sinus bradycardia rate 54 with left axis deviation, no other significant abnormalities. Personally viewed-prior 03/25/14-sinus bradycardia rate 47, no other  abnormalities. PR interval 136 ms, QTC 394 ms.  Event monitor: 11/2013-no evidence of atrial fibrillation.    ASSESSMENT AND PLAN:  1. Atrial fibrillation, paroxysmal-doing well, continue antiarrhythmic, low-dose flecainide. No recent episodes.Since cardioversion in March 2018.  No changes made. Appreciate assistance from Dr. Rayann Vargas. Office notes reviewed. Has had atrial flutter as well. Continuing with low-dose flecainide, low-dose diltiazem. Heart rate is bradycardic He may feel lightheaded or symptomatic, one time he felt this after working out. Sometimes his blood pressure may be a bit soft. He is on Avapro 75. We debated whether or not to stop this medication. He will continue for now and monitor. It was in the 140s blood pressure without. After he hydrated and ate breakfast he felt better. Continue to watch this. Prior monitor showed no evidence of dangerous bradycardia. Not interested in Eliquis or Xarelto at this time. Discussed the potential need for pacemaker at times. 2. Hypertension- Overall doing well. Previously minimally elevated. 75 Avapro. 3. Obesity- Working with Clinical research associate. Decrease carbohydrates. 10 pounds down 4. Fatigue-improved Continue with exercise. He has a Physiological scientist. 5. Tobacco cessation.=5-7 day, strongly encourage cessation. High risk 6. Obstructive sleep apnea-continue to follow with Dr. Maxwell Vargas. 7. Six-month follow-up  Signed, Candee Furbish, MD [facility]  09/30/2016 10:32 AM

## 2016-10-07 ENCOUNTER — Ambulatory Visit (INDEPENDENT_AMBULATORY_CARE_PROVIDER_SITE_OTHER): Payer: PPO | Admitting: *Deleted

## 2016-10-07 DIAGNOSIS — M5137 Other intervertebral disc degeneration, lumbosacral region: Secondary | ICD-10-CM | POA: Diagnosis not present

## 2016-10-07 DIAGNOSIS — M9902 Segmental and somatic dysfunction of thoracic region: Secondary | ICD-10-CM | POA: Diagnosis not present

## 2016-10-07 DIAGNOSIS — S39012A Strain of muscle, fascia and tendon of lower back, initial encounter: Secondary | ICD-10-CM | POA: Diagnosis not present

## 2016-10-07 DIAGNOSIS — I480 Paroxysmal atrial fibrillation: Secondary | ICD-10-CM | POA: Diagnosis not present

## 2016-10-07 DIAGNOSIS — M9905 Segmental and somatic dysfunction of pelvic region: Secondary | ICD-10-CM | POA: Diagnosis not present

## 2016-10-07 DIAGNOSIS — M9903 Segmental and somatic dysfunction of lumbar region: Secondary | ICD-10-CM | POA: Diagnosis not present

## 2016-10-07 DIAGNOSIS — S29012A Strain of muscle and tendon of back wall of thorax, initial encounter: Secondary | ICD-10-CM | POA: Diagnosis not present

## 2016-10-07 LAB — POCT INR: INR: 1.8

## 2016-10-11 ENCOUNTER — Encounter (HOSPITAL_COMMUNITY)
Admission: RE | Admit: 2016-10-11 | Discharge: 2016-10-11 | Disposition: A | Discharge status: Home / Self Care | Payer: PPO | Source: Ambulatory Visit | Attending: Internal Medicine | Admitting: Internal Medicine

## 2016-10-11 LAB — POCT HEMOGLOBIN-HEMACUE: Hemoglobin: 14.3 g/dL (ref 13.0–17.0)

## 2016-10-11 NOTE — Progress Notes (Signed)
Pt to medical day for therapeutic phlebotomy. Hemocue of 14.3. Removed 500 ml from left antecubital area. Pt tolerated well. VSS. Pt continues to drink water, denied needing food/snack. Will monitor.

## 2016-10-14 DIAGNOSIS — M9905 Segmental and somatic dysfunction of pelvic region: Secondary | ICD-10-CM | POA: Diagnosis not present

## 2016-10-14 DIAGNOSIS — S39012A Strain of muscle, fascia and tendon of lower back, initial encounter: Secondary | ICD-10-CM | POA: Diagnosis not present

## 2016-10-14 DIAGNOSIS — M9902 Segmental and somatic dysfunction of thoracic region: Secondary | ICD-10-CM | POA: Diagnosis not present

## 2016-10-14 DIAGNOSIS — M9903 Segmental and somatic dysfunction of lumbar region: Secondary | ICD-10-CM | POA: Diagnosis not present

## 2016-10-14 DIAGNOSIS — S29012A Strain of muscle and tendon of back wall of thorax, initial encounter: Secondary | ICD-10-CM | POA: Diagnosis not present

## 2016-10-14 DIAGNOSIS — M5137 Other intervertebral disc degeneration, lumbosacral region: Secondary | ICD-10-CM | POA: Diagnosis not present

## 2016-10-18 ENCOUNTER — Ambulatory Visit (INDEPENDENT_AMBULATORY_CARE_PROVIDER_SITE_OTHER): Payer: PPO | Admitting: Pharmacist

## 2016-10-18 DIAGNOSIS — I480 Paroxysmal atrial fibrillation: Secondary | ICD-10-CM

## 2016-10-18 LAB — POCT INR: INR: 2

## 2016-10-21 DIAGNOSIS — M9905 Segmental and somatic dysfunction of pelvic region: Secondary | ICD-10-CM | POA: Diagnosis not present

## 2016-10-21 DIAGNOSIS — M9903 Segmental and somatic dysfunction of lumbar region: Secondary | ICD-10-CM | POA: Diagnosis not present

## 2016-10-21 DIAGNOSIS — S39012A Strain of muscle, fascia and tendon of lower back, initial encounter: Secondary | ICD-10-CM | POA: Diagnosis not present

## 2016-10-21 DIAGNOSIS — M9902 Segmental and somatic dysfunction of thoracic region: Secondary | ICD-10-CM | POA: Diagnosis not present

## 2016-10-21 DIAGNOSIS — S29012A Strain of muscle and tendon of back wall of thorax, initial encounter: Secondary | ICD-10-CM | POA: Diagnosis not present

## 2016-10-21 DIAGNOSIS — M5137 Other intervertebral disc degeneration, lumbosacral region: Secondary | ICD-10-CM | POA: Diagnosis not present

## 2016-11-01 DIAGNOSIS — D2271 Melanocytic nevi of right lower limb, including hip: Secondary | ICD-10-CM | POA: Diagnosis not present

## 2016-11-01 DIAGNOSIS — Z85828 Personal history of other malignant neoplasm of skin: Secondary | ICD-10-CM | POA: Diagnosis not present

## 2016-11-01 DIAGNOSIS — L821 Other seborrheic keratosis: Secondary | ICD-10-CM | POA: Diagnosis not present

## 2016-11-01 DIAGNOSIS — L308 Other specified dermatitis: Secondary | ICD-10-CM | POA: Diagnosis not present

## 2016-11-01 DIAGNOSIS — D225 Melanocytic nevi of trunk: Secondary | ICD-10-CM | POA: Diagnosis not present

## 2016-11-01 DIAGNOSIS — L810 Postinflammatory hyperpigmentation: Secondary | ICD-10-CM | POA: Diagnosis not present

## 2016-11-01 DIAGNOSIS — D1801 Hemangioma of skin and subcutaneous tissue: Secondary | ICD-10-CM | POA: Diagnosis not present

## 2016-11-04 DIAGNOSIS — M9903 Segmental and somatic dysfunction of lumbar region: Secondary | ICD-10-CM | POA: Diagnosis not present

## 2016-11-04 DIAGNOSIS — M5137 Other intervertebral disc degeneration, lumbosacral region: Secondary | ICD-10-CM | POA: Diagnosis not present

## 2016-11-04 DIAGNOSIS — S29012A Strain of muscle and tendon of back wall of thorax, initial encounter: Secondary | ICD-10-CM | POA: Diagnosis not present

## 2016-11-04 DIAGNOSIS — S39012A Strain of muscle, fascia and tendon of lower back, initial encounter: Secondary | ICD-10-CM | POA: Diagnosis not present

## 2016-11-04 DIAGNOSIS — M9902 Segmental and somatic dysfunction of thoracic region: Secondary | ICD-10-CM | POA: Diagnosis not present

## 2016-11-04 DIAGNOSIS — M9905 Segmental and somatic dysfunction of pelvic region: Secondary | ICD-10-CM | POA: Diagnosis not present

## 2016-11-12 DIAGNOSIS — G4733 Obstructive sleep apnea (adult) (pediatric): Secondary | ICD-10-CM | POA: Diagnosis not present

## 2016-11-15 ENCOUNTER — Ambulatory Visit (INDEPENDENT_AMBULATORY_CARE_PROVIDER_SITE_OTHER): Payer: PPO

## 2016-11-15 DIAGNOSIS — I480 Paroxysmal atrial fibrillation: Secondary | ICD-10-CM | POA: Diagnosis not present

## 2016-11-15 LAB — POCT INR: INR: 1.7

## 2016-11-21 ENCOUNTER — Other Ambulatory Visit: Payer: Self-pay | Admitting: Cardiology

## 2016-11-21 DIAGNOSIS — H353131 Nonexudative age-related macular degeneration, bilateral, early dry stage: Secondary | ICD-10-CM | POA: Diagnosis not present

## 2016-11-21 DIAGNOSIS — H4423 Degenerative myopia, bilateral: Secondary | ICD-10-CM | POA: Diagnosis not present

## 2016-11-21 DIAGNOSIS — H26491 Other secondary cataract, right eye: Secondary | ICD-10-CM | POA: Diagnosis not present

## 2016-11-21 DIAGNOSIS — H43813 Vitreous degeneration, bilateral: Secondary | ICD-10-CM | POA: Diagnosis not present

## 2016-11-25 DIAGNOSIS — H1013 Acute atopic conjunctivitis, bilateral: Secondary | ICD-10-CM | POA: Diagnosis not present

## 2016-11-25 DIAGNOSIS — H04123 Dry eye syndrome of bilateral lacrimal glands: Secondary | ICD-10-CM | POA: Diagnosis not present

## 2016-11-25 DIAGNOSIS — H16423 Pannus (corneal), bilateral: Secondary | ICD-10-CM | POA: Diagnosis not present

## 2016-11-25 DIAGNOSIS — H40013 Open angle with borderline findings, low risk, bilateral: Secondary | ICD-10-CM | POA: Diagnosis not present

## 2016-11-26 ENCOUNTER — Encounter (INDEPENDENT_AMBULATORY_CARE_PROVIDER_SITE_OTHER): Payer: Self-pay

## 2016-11-26 ENCOUNTER — Encounter: Payer: Self-pay | Admitting: Nurse Practitioner

## 2016-11-26 ENCOUNTER — Ambulatory Visit (INDEPENDENT_AMBULATORY_CARE_PROVIDER_SITE_OTHER): Payer: PPO | Admitting: Nurse Practitioner

## 2016-11-26 VITALS — BP 142/84 | HR 56 | Ht 74.0 in | Wt 233.8 lb

## 2016-11-26 DIAGNOSIS — I10 Essential (primary) hypertension: Secondary | ICD-10-CM | POA: Diagnosis not present

## 2016-11-26 DIAGNOSIS — I481 Persistent atrial fibrillation: Secondary | ICD-10-CM | POA: Diagnosis not present

## 2016-11-26 DIAGNOSIS — Z79899 Other long term (current) drug therapy: Secondary | ICD-10-CM

## 2016-11-26 DIAGNOSIS — Z7901 Long term (current) use of anticoagulants: Secondary | ICD-10-CM

## 2016-11-26 DIAGNOSIS — I4819 Other persistent atrial fibrillation: Secondary | ICD-10-CM

## 2016-11-26 LAB — CBC
Hematocrit: 44.4 % (ref 37.5–51.0)
Hemoglobin: 15.3 g/dL (ref 13.0–17.7)
MCH: 34.9 pg — ABNORMAL HIGH (ref 26.6–33.0)
MCHC: 34.5 g/dL (ref 31.5–35.7)
MCV: 101 fL — ABNORMAL HIGH (ref 79–97)
Platelets: 207 10*3/uL (ref 150–379)
RBC: 4.39 x10E6/uL (ref 4.14–5.80)
RDW: 14.6 % (ref 12.3–15.4)
WBC: 5.8 10*3/uL (ref 3.4–10.8)

## 2016-11-26 LAB — BASIC METABOLIC PANEL
BUN/Creatinine Ratio: 14 (ref 10–24)
BUN: 16 mg/dL (ref 8–27)
CO2: 22 mmol/L (ref 20–29)
Calcium: 9.3 mg/dL (ref 8.6–10.2)
Chloride: 104 mmol/L (ref 96–106)
Creatinine, Ser: 1.12 mg/dL (ref 0.76–1.27)
GFR calc Af Amer: 76 mL/min/{1.73_m2} (ref >59)
GFR calc non Af Amer: 66 mL/min/{1.73_m2} (ref >59)
Glucose: 124 mg/dL — ABNORMAL HIGH (ref 65–99)
Potassium: 4.9 mmol/L (ref 3.5–5.2)
Sodium: 139 mmol/L (ref 134–144)

## 2016-11-26 NOTE — Patient Instructions (Addendum)
We will be checking the following labs today - BMET & CBC   Medication Instructions:    Continue with your current medicines.     Testing/Procedures To Be Arranged:  N/A  Follow-Up:   See me in one month with your BP diary.  You have an appointment scheduled December 30, 2016 at 8:30 AM with [name].    Other Special Instructions:   Monitor your BP and your HR for me over the next month.   Keep limiting your salt  Keep up the exercise!    If you need a refill on your cardiac medications before your next appointment, please call your pharmacy.   Call the [place] office at [phone] if you have any questions, problems or concerns.

## 2016-11-26 NOTE — Progress Notes (Signed)
CARDIOLOGY OFFICE NOTE  Date:  11/26/2016    Jonathan Vargas Date of Birth: Jul 22, 1945 Medical Record #[id number]  PCP:  Leeroy Cha, MD  Cardiologist:  Servando [facility]  Chief Complaint  Patient presents with  . Atrial Fibrillation    Follow up visit - seen for Dr. Marlowe Sax    History of Present Illness: Jonathan Vargas is a 71 y.o. male who presents today for a follow up visit. Seen for Dr. Marlowe Sax.  He has a history of atrial fibrillation/flutter paroxysmal on antiarrhythmic and is on warfarin for his anticoagulation. Had stress test 09/01/12 that demonstrated a mild inferior wall defect, possible diaphragmatic attenuation, overall low risk study with normal ejection fraction. Other issues include HTN, prior carotid artery dissection in 1997, ongoing tobacco abuse, hemachromatosis. Previous hospitalization in November 2012 demonstrated paroxysmal atrial flutter which converted after 4 hours of diltiazem. He had a postconversion pause of 8.2 seconds, felt flushed.  Endorses chronic palpitations.   I saw him as a work in back in March - out of rhythm. Somewhat symptomatic. Got him cardioverted and back to see [name] for discussion of other options - but opted for his continued regimen. ARB was restarted for elevated BP. Patient elected to continue CCB therapy despite bradycardia. Remains on anticoagulation with coumadin.   Comes in today. Here alone. Notes he is doing well. Has increased his ARB on his own for better BP control just a few days ago. BP still steadily going up. He attributes this to when he stopped his wine. Now drinking a martini every night.  Trying to work on his weight. Wants his stomach smaller. Working with a Clinical research associate who told him to stop the alcohol. His back has been bothering him - has not played golf this summer. But otherwise he feels good. No chest pain. Breathing is good. HR in the low 50's to sometimes in the 40's. Not  really dizzy and has not had syncope.   Past Medical History:  Diagnosis Date  . Acid reflux   . Arthritis   . ASD (atrial septal defect)   . Asthma with allergic rhinitis and status asthmaticus   . Atrial fib/flutter, transient   . Cancer ([place])    skin  . Chronic anticoagulation   . Depression   . ENT complaint    Dr. Erik Obey  . Erectile dysfunction   . Fatty liver   . FHx: allergies   . Gout    , Possible, Right Knee x2 ; intermittent milder episodes of pain in hte first MTP - uric acid 7.7- no treatment so far.  . Hemochromatosis   . History of kidney stones   . Hypercholesteremia   . Hyperlipemia   . Hypertension   . Hypogonadism male   . Impaired fasting glucose   . 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  . Pneumonia    " walking PNA > 40 years ago"  . Seasonal allergies   . Sleep apnea    wears CPAP ( set at 4-11)  . Trigger finger, left    , fourth finger    Past Surgical History:  Procedure Laterality Date  . CARDIOVERSION N/A 05/29/2016   Procedure: CARDIOVERSION;  Surgeon: [name], MD;  Location: [facility];  Service: Cardiovascular;  Laterality: N/A;  . CATARACT EXTRACTION W/ INTRAOCULAR LENS  IMPLANT, BILATERAL    . COLONOSCOPY W/ POLYPECTOMY    . COLONOSCOPY WITH PROPOFOL N/A  09/26/2016   Procedure: COLONOSCOPY WITH PROPOFOL;  Surgeon: Wilford Corner, MD;  Location: [facility];  Service: Endoscopy;  Laterality: N/A;  . ESOPHAGOGASTRODUODENOSCOPY (EGD) WITH PROPOFOL N/A 09/26/2016   Procedure: ESOPHAGOGASTRODUODENOSCOPY (EGD) WITH PROPOFOL;  Surgeon: Wilford Corner, MD;  Location: [facility];  Service: Endoscopy;  Laterality: N/A;  . LIVER BIOPSY    . NASAL SINUS SURGERY  1997     Medications: Current Meds  Medication Sig  . allopurinol (ZYLOPRIM) 300 MG tablet Take 300 mg by mouth daily.  Marland Kitchen CALCIUM PO Take 200 mg by mouth 2 (two) times daily.  Marland Kitchen COLCRYS 0.6  MG tablet Take 0.6 mg by mouth 2 (two) times daily as needed (for gout).   Marland Kitchen diclofenac sodium (VOLTAREN) 1 % GEL Apply 1 application topically 4 (four) times daily as needed (for pain).   Marland Kitchen diltiazem (CARDIZEM CD) 120 MG 24 hr capsule Take 1 capsule (120 mg total) by mouth daily.  . fexofenadine (ALLEGRA) 60 MG tablet Take 60 mg by mouth daily as needed. For allergies  . flecainide (TAMBOCOR) 50 MG tablet Take 1 tablet (50 mg total) by mouth 2 (two) times daily.  . fluticasone (FLONASE) 50 MCG/ACT nasal spray Place 1 spray into the nose daily.   . irbesartan (AVAPRO) 150 MG tablet Take 150 mg by mouth daily.  Marland Kitchen MAGNESIUM PO Take 1 capsule by mouth daily.  . Misc Natural Products (LUTEIN 20) CAPS Take 40 mg by mouth 2 (two) times daily.   . NON FORMULARY CPAP  . PROAIR RESPICLICK 983 (90 Base) MCG/ACT AEPB Inhale 1 puff into the lungs daily as needed. Shortness of breath  . ranitidine (ZANTAC) 300 MG tablet Take 300 mg by mouth every evening.   . TURMERIC PO Take 1 capsule by mouth daily.  . vitamin B-12 (CYANOCOBALAMIN) 1000 MCG tablet Take 1,000 mcg by mouth daily.  Marland Kitchen warfarin (COUMADIN) 5 MG tablet TAKE AS DIRECTED BY ANTICOAGULATION CLINIC     Allergies: Allergies  Allergen Reactions  . Clindamycin/Lincomycin Diarrhea and Other (See Comments)    Stomach pain   . Amoxicillin Rash    Social History: The patient  reports that he has been smoking Cigarettes.  He started smoking about 3 years ago. He has been smoking about 0.25 packs per day. He has quit using smokeless tobacco. His smokeless tobacco use included Chew. He reports that he drinks alcohol. He reports that he does not use drugs.   Family History: The patient's family history includes Diabetes type II in his father and maternal grandfather; Heart disease in his mother; Stroke in his father.   Review of Systems: Please see the history of present illness.   Otherwise, the review of systems is positive for none.   All other  systems are reviewed and negative.   Physical Exam: VS:  BP (!) 142/84   Pulse (!) 56   Ht 6\' 2"  (1.88 m)   Wt 233 lb 12.8 oz (106.1 kg)   BMI 30.02 kg/m  .  BMI Body mass index is 30.02 kg/m.  Wt Readings from Last 3 Encounters:  11/26/16 233 lb 12.8 oz (106.1 kg)  09/30/16 234 lb 12.8 oz (106.5 kg)  09/26/16 236 lb (107 kg)   His BP machine was 150/80. Repeat BP by me is 140/80.   General: Pleasant. Well developed, well nourished and in no acute distress. He remains obese.   HEENT: Normal.  Neck: Supple, no JVD, carotid bruits, or masses noted.  Cardiac: Regular rate and  rhythm. No murmurs, rubs, or gallops. No edema.  Respiratory:  Lungs are clear to auscultation bilaterally with normal work of breathing.  GI: Soft and nontender.  MS: No deformity or atrophy. Gait and ROM intact.  Skin: Warm and dry. Color is normal.  Neuro:  Strength and sensation are intact and no gross focal deficits noted.  Psych: Alert, appropriate and with normal affect.   LABORATORY DATA:  EKG:  EKG is ordered today. This demonstrates sinus bradycardia. HR is 44  Lab Results  Component Value Date   WBC 7.5 05/27/2016   HGB 14.3 10/11/2016   HCT 42.0 09/26/2016   PLT 228 05/27/2016   GLUCOSE 114 (H) 09/26/2016   NA 141 09/26/2016   K 3.9 09/26/2016   CL 100 05/27/2016   CREATININE 1.43 (H) 05/27/2016   BUN 20 05/27/2016   CO2 22 05/27/2016   TSH 3.400 05/27/2016   INR 1.7 11/15/2016     Lab Results  Component Value Date   INR 1.7 11/15/2016   INR 2.0 10/18/2016   INR 1.8 10/07/2016     BNP (last 3 results) No results for input(s): BNP in the last 8760 hours.  ProBNP (last 3 results) No results for input(s): PROBNP in the last 8760 hours.   Other Studies Reviewed Today:  Echo Study Conclusions 05/2016  - Left ventricle: The cavity size was normal. Wall thickness was   increased in a pattern of mild LVH. Systolic function was normal.   The estimated ejection fraction was  in the range of 55% to 60%.   Wall motion was normal; there were no regional wall motion   abnormalities. Left ventricular diastolic function parameters   were normal. - Left atrium: The atrium was moderately dilated. - Atrial septum: No defect or patent foramen ovale was identified.  Procedure: Electrical Cardioversion Indications:  Atrial Flutter  Procedure Details Consent: Risks of procedure as well as the alternatives and risks of each were explained to the (patient/caregiver).  Consent for procedure obtained. Time Out: Verified patient identification, verified procedure, site/side was marked, verified correct patient position, special equipment/implants available, medications/allergies/relevent history reviewed, required imaging and test results available.  Performed  Patient placed on cardiac monitor, pulse oximetry, supplemental oxygen as necessary.  Sedation given: Propofol 70 mg Pacer pads placed anterior and posterior chest. Cardioverted 1 time(s).  Cardioverted at 120J. Evaluation Findings: Post procedure EKG shows: NSR Complications: None Patient did tolerate procedure well.  [name], MD 05/29/2016, 1:53 PM    Assessment/Plan:  1. PAF/flutter - typically with resting bradycardia - prior cardioversion earlier this year - he remains in sinus rhythm. Remains bradycardic - I do not get the impression that he is very symptomatic. Will ask him to monitor. Would change Diltiazem to Norvasc if needed for better BP control on return.   2. HTN - BP up some - he has just increased his ARB - will monitor for now - could consider change his Diltiazem to Norvasc. See back in a month with his BP diary. His cuff is about 10 points higher.   3. Chronic anticoagulation with coumadin - no problems noted.   4. Obesity - seems more motivated  5. 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 He has stopped his wine - now drinking vodka - advised moderation.  Advised total cessation of tobacco.   Current medicines are reviewed with the patient today.  The patient does not have concerns regarding medicines other than what has been noted above.  The following changes have been made:  See above.  Labs/ tests ordered today include:    Orders Placed This Encounter  Procedures  . Basic metabolic panel  . CBC  . EKG 12-Lead     Disposition:   FU with me in one month.   Patient is agreeable to this plan and will call if any problems develop in the interim.   SignedTruitt Merle, NP  11/26/2016 8:28 AM  [facility] [address] [place], [place]  [address] Phone: [phone] Fax: [phone]

## 2016-12-06 ENCOUNTER — Ambulatory Visit (INDEPENDENT_AMBULATORY_CARE_PROVIDER_SITE_OTHER): Payer: PPO | Admitting: Pharmacist

## 2016-12-06 DIAGNOSIS — I480 Paroxysmal atrial fibrillation: Secondary | ICD-10-CM | POA: Diagnosis not present

## 2016-12-06 LAB — POCT INR: INR: 2

## 2016-12-09 DIAGNOSIS — M9902 Segmental and somatic dysfunction of thoracic region: Secondary | ICD-10-CM | POA: Diagnosis not present

## 2016-12-09 DIAGNOSIS — S29012A Strain of muscle and tendon of back wall of thorax, initial encounter: Secondary | ICD-10-CM | POA: Diagnosis not present

## 2016-12-09 DIAGNOSIS — M9905 Segmental and somatic dysfunction of pelvic region: Secondary | ICD-10-CM | POA: Diagnosis not present

## 2016-12-09 DIAGNOSIS — M9903 Segmental and somatic dysfunction of lumbar region: Secondary | ICD-10-CM | POA: Diagnosis not present

## 2016-12-09 DIAGNOSIS — M5137 Other intervertebral disc degeneration, lumbosacral region: Secondary | ICD-10-CM | POA: Diagnosis not present

## 2016-12-09 DIAGNOSIS — S39012A Strain of muscle, fascia and tendon of lower back, initial encounter: Secondary | ICD-10-CM | POA: Diagnosis not present

## 2016-12-15 ENCOUNTER — Other Ambulatory Visit: Payer: Self-pay | Admitting: Cardiology

## 2016-12-20 DIAGNOSIS — N401 Enlarged prostate with lower urinary tract symptoms: Secondary | ICD-10-CM | POA: Diagnosis not present

## 2016-12-20 DIAGNOSIS — R351 Nocturia: Secondary | ICD-10-CM | POA: Diagnosis not present

## 2016-12-20 DIAGNOSIS — N5201 Erectile dysfunction due to arterial insufficiency: Secondary | ICD-10-CM | POA: Diagnosis not present

## 2016-12-20 DIAGNOSIS — R972 Elevated prostate specific antigen [PSA]: Secondary | ICD-10-CM | POA: Diagnosis not present

## 2016-12-23 DIAGNOSIS — N529 Male erectile dysfunction, unspecified: Secondary | ICD-10-CM | POA: Diagnosis not present

## 2016-12-23 DIAGNOSIS — K219 Gastro-esophageal reflux disease without esophagitis: Secondary | ICD-10-CM | POA: Diagnosis not present

## 2016-12-23 DIAGNOSIS — F329 Major depressive disorder, single episode, unspecified: Secondary | ICD-10-CM | POA: Diagnosis not present

## 2016-12-23 DIAGNOSIS — I4891 Unspecified atrial fibrillation: Secondary | ICD-10-CM | POA: Diagnosis not present

## 2016-12-23 DIAGNOSIS — M109 Gout, unspecified: Secondary | ICD-10-CM | POA: Diagnosis not present

## 2016-12-23 DIAGNOSIS — Z0000 Encounter for general adult medical examination without abnormal findings: Secondary | ICD-10-CM | POA: Diagnosis not present

## 2016-12-23 DIAGNOSIS — Z1389 Encounter for screening for other disorder: Secondary | ICD-10-CM | POA: Diagnosis not present

## 2016-12-23 DIAGNOSIS — R972 Elevated prostate specific antigen [PSA]: Secondary | ICD-10-CM | POA: Diagnosis not present

## 2016-12-23 DIAGNOSIS — I10 Essential (primary) hypertension: Secondary | ICD-10-CM | POA: Diagnosis not present

## 2016-12-24 ENCOUNTER — Telehealth: Payer: Self-pay | Admitting: Nurse Practitioner

## 2016-12-24 NOTE — Telephone Encounter (Signed)
° °  [facility] Pre-operative Risk Assessment    Request for surgical clearance:  1. What type of surgery is being performed? In office Prostate Biopsy   2. When is this surgery scheduled? 01-17-17-   3. Are there any medications that need to be held prior to surgery and how long?Warfarin can he hold 5 days prior to surgery ?  4. Practice name and name of physician performing surgery?Dr Franchot Gallo   5. What is your office phone and fax number? [phone] and fax is [phone]   6. Anesthesia type (None, local, MAC, general) ?    Glyn Ade 12/24/2016, 10:29 AM  _________________________________________________________________   (provider comments below)

## 2016-12-24 NOTE — Telephone Encounter (Signed)
Pt takes warfarin for afib with CHADS2VASc of 3 (age, HTN, PVD). Ok to hold warfarin for 5 days prior to procedure. Clearance faxed to below number.

## 2016-12-27 ENCOUNTER — Encounter: Payer: Self-pay | Admitting: *Deleted

## 2016-12-30 ENCOUNTER — Ambulatory Visit (INDEPENDENT_AMBULATORY_CARE_PROVIDER_SITE_OTHER): Payer: PPO | Admitting: Pharmacist Clinician (PhC)/ Clinical Pharmacy Specialist

## 2016-12-30 ENCOUNTER — Ambulatory Visit (INDEPENDENT_AMBULATORY_CARE_PROVIDER_SITE_OTHER): Payer: PPO | Admitting: Nurse Practitioner

## 2016-12-30 ENCOUNTER — Encounter: Payer: Self-pay | Admitting: Nurse Practitioner

## 2016-12-30 VITALS — BP 146/82 | HR 50 | Ht 74.0 in | Wt 233.1 lb

## 2016-12-30 DIAGNOSIS — Z7901 Long term (current) use of anticoagulants: Secondary | ICD-10-CM

## 2016-12-30 DIAGNOSIS — I10 Essential (primary) hypertension: Secondary | ICD-10-CM | POA: Diagnosis not present

## 2016-12-30 DIAGNOSIS — I480 Paroxysmal atrial fibrillation: Secondary | ICD-10-CM

## 2016-12-30 LAB — POCT INR: INR: 2.1

## 2016-12-30 NOTE — Patient Instructions (Addendum)
We will be checking the following labs today - NONE   Medication Instructions:    Continue with your current medicines.     Testing/Procedures To Be Arranged:  N/A  Follow-Up:   See me in January.    Other Special Instructions:   Think about what we talked about today.     If you need a refill on your cardiac medications before your next appointment, please call your pharmacy.   Call the [place] office at [phone] if you have any questions, problems or concerns.

## 2016-12-30 NOTE — Progress Notes (Addendum)
CARDIOLOGY OFFICE NOTE  Date:  12/30/2016    Jonathan Vargas Date of Birth: [birth year] Medical Record #[id number]  PCP:  Leeroy Cha, MD  Cardiologist:  [facility]    Chief Complaint  Patient presents with  . Hypertension    Follow up visit - seen for Dr. Marlowe Sax    History of Present Illness: Jonathan Vargas "Jonathan Vargas" is a 71 y.o. male who presents today for a one month check. Seen for Dr. Marlowe Sax.  He has a history of atrial fibrillation/flutter paroxysmal on antiarrhythmic and is on warfarin for his anticoagulation. Had stress test 09/01/12 that demonstrated a mild inferior wall defect, possible diaphragmatic attenuation, overall low risk study with normal ejection fraction. Other issues include HTN, prior carotid artery dissection in 1997, ongoing tobacco abuse, hemachromatosis. Previous hospitalization in November 2012 demonstrated paroxysmal atrial flutter which converted after 4 hours of diltiazem. He had a postconversion pause of 8.2 seconds, felt flushed. Endorses chronic palpitations.   I saw him as a work in back in March - out of rhythm. Somewhat symptomatic. Got him cardioverted and back to see [name] for discussion of other options - but opted for his continued regimen. ARB was restarted for elevated BP. Patient elected to continue CCB therapy despite bradycardia. Remains on anticoagulation with coumadin.   I last saw him a month ago - BP was up - he had increased his ARB on his own. He attributed some of this to drinking wine and was trying to cut back. His cuff runs about 10 points higher.   Comes in today. Here alone. He is doing ok. He tried giving up his wine for about a month - says it did not make much difference with his weight - but he was drinking liquor as a substitute. No chest pain. His BP readings from home are noted - for the most part they are ok - especially taking into consideration that his cuff is 10 points  higher. He thinks he may have had a brief episode of AF - he is not sure - the light on his cuff was blinking - this was a one time thing - he felt fine. He is exercising a few times at the gym. He is trying to work on his habits - considering Weight Watchers as well. Not lightheaded or dizzy. Overall, he feels ok. We did lab last month and it was ok.   Past Medical History:  Diagnosis Date  . Acid reflux   . Arthritis   . ASD (atrial septal defect)   . Asthma with allergic rhinitis and status asthmaticus   . Atrial fib/flutter, transient   . Cancer ([place])    skin  . Chronic anticoagulation   . Depression   . ENT complaint    Dr. Erik Obey  . Erectile dysfunction   . Fatty liver   . FHx: allergies   . Gout    , Possible, Right Knee x2 ; intermittent milder episodes of pain in hte first MTP - uric acid 7.7- no treatment so far.  . Hemochromatosis   . History of kidney stones   . Hypercholesteremia   . Hyperlipemia   . Hypertension   . Hypogonadism male   . Impaired fasting glucose   . 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  . Pneumonia    " walking PNA > 40 years ago"  . Seasonal allergies   . Sleep apnea  wears CPAP ( set at 4-11)  . Trigger finger, left    , fourth finger    Past Surgical History:  Procedure Laterality Date  . CARDIOVERSION N/A 05/29/2016   Procedure: CARDIOVERSION;  Surgeon: [name], MD;  Location: [facility];  Service: Cardiovascular;  Laterality: N/A;  . CATARACT EXTRACTION W/ INTRAOCULAR LENS  IMPLANT, BILATERAL    . COLONOSCOPY W/ POLYPECTOMY    . COLONOSCOPY WITH PROPOFOL N/A 09/26/2016   Procedure: COLONOSCOPY WITH PROPOFOL;  Surgeon: Wilford Corner, MD;  Location: [facility];  Service: Endoscopy;  Laterality: N/A;  . ESOPHAGOGASTRODUODENOSCOPY (EGD) WITH PROPOFOL N/A 09/26/2016   Procedure: ESOPHAGOGASTRODUODENOSCOPY (EGD) WITH PROPOFOL;  Surgeon: Wilford Corner, MD;  Location: [facility];  Service: Endoscopy;  Laterality: N/A;  . LIVER BIOPSY    . NASAL SINUS SURGERY  1997     Medications: Current Meds  Medication Sig  . allopurinol (ZYLOPRIM) 300 MG tablet Take 300 mg by mouth daily.  Marland Kitchen CALCIUM PO Take 200 mg by mouth 2 (two) times daily.  Marland Kitchen COLCRYS 0.6 MG tablet Take 0.6 mg by mouth 2 (two) times daily as needed (for gout).   Marland Kitchen diclofenac sodium (VOLTAREN) 1 % GEL Apply 1 application topically 4 (four) times daily as needed (for pain).   Marland Kitchen diltiazem (CARDIZEM CD) 120 MG 24 hr capsule Take 1 capsule (120 mg total) by mouth daily.  . fexofenadine (ALLEGRA) 60 MG tablet Take 60 mg by mouth daily as needed. For allergies  . flecainide (TAMBOCOR) 50 MG tablet TAKE 1 TABLET BY MOUTH TWICE A DAY  . fluticasone (FLONASE) 50 MCG/ACT nasal spray Place 1 spray into the nose daily.   . irbesartan (AVAPRO) 150 MG tablet Take 150 mg by mouth daily.  Marland Kitchen MAGNESIUM PO Take 1 capsule by mouth daily.  . Misc Natural Products (LUTEIN 20) CAPS Take 40 mg by mouth 2 (two) times daily.   . NON FORMULARY CPAP  . PROAIR RESPICLICK 235 (90 Base) MCG/ACT AEPB Inhale 1 puff into the lungs daily as needed. Shortness of breath  . ranitidine (ZANTAC) 300 MG tablet Take 300 mg by mouth every evening.   . TURMERIC PO Take 1 capsule by mouth daily.  . vitamin B-12 (CYANOCOBALAMIN) 1000 MCG tablet Take 1,000 mcg by mouth daily.  Marland Kitchen warfarin (COUMADIN) 5 MG tablet TAKE AS DIRECTED BY ANTICOAGULATION CLINIC     Allergies: Allergies  Allergen Reactions  . Clindamycin/Lincomycin Diarrhea and Other (See Comments)    Stomach pain   . Amoxicillin Rash    Social History: The patient  reports that he has been smoking Cigarettes.  He started smoking about 3 years ago. He has been smoking about 0.25 packs per day. He has quit using smokeless tobacco. His smokeless tobacco use included Chew. He reports that he drinks alcohol. He reports that he does not use drugs.     Family History: The patient's family history includes Diabetes type II in his father and maternal grandfather; Heart disease in his mother; Stroke in his father.   Review of Systems: Please see the history of present illness.   Otherwise, the review of systems is positive for none.   All other systems are reviewed and negative.   Physical Exam: VS:  BP (!) 146/82 (BP Location: Left Arm, Patient Position: Sitting, Cuff Size: Normal)   Pulse (!) 50   Ht 6\' 2"  (1.88 m)   Wt 233 lb 1.9 oz (105.7 kg)   SpO2 97% Comment: at rest  BMI  29.93 kg/m  .  BMI Body mass index is 29.93 kg/m.  Wt Readings from Last 3 Encounters:  12/30/16 233 lb 1.9 oz (105.7 kg)  11/26/16 233 lb 12.8 oz (106.1 kg)  09/30/16 234 lb 12.8 oz (106.5 kg)   BP recheck by me is down to 128/84.  General: Pleasant. Well developed, well nourished and in no acute distress.   HEENT: Normal.  Neck: Supple, no JVD, carotid bruits, or masses noted.  Cardiac: Regular rate and rhythm. No murmurs, rubs, or gallops. No edema.  Respiratory:  Lungs are clear to auscultation bilaterally with normal work of breathing.  GI: Soft and nontender.  MS: No deformity or atrophy. Gait and ROM intact.  Skin: Warm and dry. Color is normal.  Neuro:  Strength and sensation are intact and no gross focal deficits noted.  Psych: Alert, appropriate and with normal affect.   LABORATORY DATA:  EKG:  EKG is not ordered today.  Lab Results  Component Value Date   WBC 5.8 11/26/2016   HGB 15.3 11/26/2016   HCT 44.4 11/26/2016   PLT 207 11/26/2016   GLUCOSE 124 (H) 11/26/2016   NA 139 11/26/2016   K 4.9 11/26/2016   CL 104 11/26/2016   CREATININE 1.12 11/26/2016   BUN 16 11/26/2016   CO2 22 11/26/2016   TSH 3.400 05/27/2016   INR 2.1 12/30/2016       BNP (last 3 results) No results for input(s): BNP in the last 8760 hours.  ProBNP (last 3 results) No results for input(s): PROBNP in the last 8760 hours.   Other Studies  Reviewed Today:  Echo Study Conclusions 05/2016  - Left ventricle: The cavity size was normal. Wall thickness was increased in a pattern of mild LVH. Systolic function was normal. The estimated ejection fraction was in the range of 55% to 60%. Wall motion was normal; there were no regional wall motion abnormalities. Left ventricular diastolic function parameters were normal. - Left atrium: The atrium was moderately dilated. - Atrial septum: No defect or patent foramen ovale was identified.  Procedure: Electrical Cardioversion Indications:Atrial Flutter  Procedure Details Consent: Risks of procedure as well as the alternatives and risks of each were explained to the (patient/caregiver). Consent for procedure obtained. Time [id number] patient identification, verified procedure, site/side was marked, verified correct patient position, special equipment/implants available, medications/allergies/relevent history reviewed, required imaging and test results available. Performed  Patient placed on cardiac monitor, pulse oximetry, supplemental oxygen as necessary.  Sedation given: Propofol 70 mg Pacer pads placed anterior and posterior chest. Cardioverted 1time(s).  Cardioverted at 120J. Evaluation Findings: Post procedure EKG shows: NSR Complications: None Patient didtolerate procedure well.  [name], MD 05/29/2016, 1:53 PM    Assessment/Plan:  1. PAF/flutter - typically with resting bradycardia - prior cardioversion earlier this year - he remains in sinus rhythm by exam today. Remains bradycardic - I still do not get the impression that he is very symptomatic. For now, no change in his medicines - but could consider changing out his CCB to Norvasc if needed.    2. HTN - BP recheck by me is fine. His cuff is about 10 points higher. No changes made today. He will continue to monitor at home.   3. Chronic anticoagulation with coumadin - no problems  noted. Getting INR today.   4. Obesity - seems more motivated - encouraged him to think about Weight Watchers. Needs alcohol in moderation.   5. 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 Discussed at length today.   Current medicines are reviewed with the patient today.  The patient does not have concerns regarding medicines other than what has been noted above.  The following changes have been made:  See above.  Labs/ tests ordered today include:   No orders of the defined types were placed in this encounter.    Disposition:   He would like to see me in 3 months.   Patient is agreeable to this plan and will call if any problems develop in the interim.   SignedTruitt Merle, NP  12/30/2016 9:30 AM  [facility] [address] [place], [place]  [address] Phone: [phone] Fax: [phone]

## 2017-01-06 DIAGNOSIS — M9905 Segmental and somatic dysfunction of pelvic region: Secondary | ICD-10-CM | POA: Diagnosis not present

## 2017-01-06 DIAGNOSIS — M9903 Segmental and somatic dysfunction of lumbar region: Secondary | ICD-10-CM | POA: Diagnosis not present

## 2017-01-06 DIAGNOSIS — S39012A Strain of muscle, fascia and tendon of lower back, initial encounter: Secondary | ICD-10-CM | POA: Diagnosis not present

## 2017-01-06 DIAGNOSIS — M9902 Segmental and somatic dysfunction of thoracic region: Secondary | ICD-10-CM | POA: Diagnosis not present

## 2017-01-06 DIAGNOSIS — S29012A Strain of muscle and tendon of back wall of thorax, initial encounter: Secondary | ICD-10-CM | POA: Diagnosis not present

## 2017-01-06 DIAGNOSIS — M5137 Other intervertebral disc degeneration, lumbosacral region: Secondary | ICD-10-CM | POA: Diagnosis not present

## 2017-01-09 ENCOUNTER — Other Ambulatory Visit: Payer: Self-pay | Admitting: Cardiology

## 2017-01-09 DIAGNOSIS — J329 Chronic sinusitis, unspecified: Secondary | ICD-10-CM | POA: Diagnosis not present

## 2017-01-17 DIAGNOSIS — C61 Malignant neoplasm of prostate: Secondary | ICD-10-CM | POA: Diagnosis not present

## 2017-01-17 DIAGNOSIS — R972 Elevated prostate specific antigen [PSA]: Secondary | ICD-10-CM | POA: Diagnosis not present

## 2017-01-22 ENCOUNTER — Telehealth: Payer: Self-pay

## 2017-01-22 DIAGNOSIS — E669 Obesity, unspecified: Secondary | ICD-10-CM | POA: Diagnosis not present

## 2017-01-22 DIAGNOSIS — M150 Primary generalized (osteo)arthritis: Secondary | ICD-10-CM | POA: Diagnosis not present

## 2017-01-22 DIAGNOSIS — Z79899 Other long term (current) drug therapy: Secondary | ICD-10-CM | POA: Diagnosis not present

## 2017-01-22 DIAGNOSIS — Z6832 Body mass index (BMI) 32.0-32.9, adult: Secondary | ICD-10-CM | POA: Diagnosis not present

## 2017-01-22 DIAGNOSIS — M1A09X0 Idiopathic chronic gout, multiple sites, without tophus (tophi): Secondary | ICD-10-CM | POA: Diagnosis not present

## 2017-01-22 NOTE — Telephone Encounter (Signed)
Received a Rx for irbesartan 75 mg 1 tabley qd. Per notes from ov with [name] 07/03/16 it states that pt should restart irbesartan 75 mg 1/2 tab qd. Then again on office visit Dr. Marlou Porch mediations were keep the same. 11/26/16 and 12/30/16 office visit with Truitt Merle, irbesartan was changed to 150 mg qd. What dose is patient currently taking so that it can be refilled?

## 2017-01-22 NOTE — Telephone Encounter (Signed)
Received written script from [facility] to refill pt irbesartan 75 mg qd but on pt med list said pt was taking irbesartan 150 mg  Call pt to confirm and pt has been taken Irbesartan 150 mg qd.

## 2017-01-22 NOTE — Telephone Encounter (Signed)
error 

## 2017-01-22 NOTE — Telephone Encounter (Signed)
Spoke with pt about refill

## 2017-01-31 ENCOUNTER — Ambulatory Visit: Payer: PPO | Admitting: *Deleted

## 2017-01-31 DIAGNOSIS — Z5181 Encounter for therapeutic drug level monitoring: Secondary | ICD-10-CM

## 2017-01-31 DIAGNOSIS — I480 Paroxysmal atrial fibrillation: Secondary | ICD-10-CM

## 2017-01-31 NOTE — Patient Instructions (Signed)
Continue taking 2.5 tablets daily except 2 tablets on Sundays and Fridays.  Call if placed on any new medications [phone]. Recheck INR in 4 weeks.

## 2017-02-10 DIAGNOSIS — M9905 Segmental and somatic dysfunction of pelvic region: Secondary | ICD-10-CM | POA: Diagnosis not present

## 2017-02-10 DIAGNOSIS — R351 Nocturia: Secondary | ICD-10-CM | POA: Diagnosis not present

## 2017-02-10 DIAGNOSIS — N401 Enlarged prostate with lower urinary tract symptoms: Secondary | ICD-10-CM | POA: Diagnosis not present

## 2017-02-10 DIAGNOSIS — M9902 Segmental and somatic dysfunction of thoracic region: Secondary | ICD-10-CM | POA: Diagnosis not present

## 2017-02-10 DIAGNOSIS — S29012A Strain of muscle and tendon of back wall of thorax, initial encounter: Secondary | ICD-10-CM | POA: Diagnosis not present

## 2017-02-10 DIAGNOSIS — M5137 Other intervertebral disc degeneration, lumbosacral region: Secondary | ICD-10-CM | POA: Diagnosis not present

## 2017-02-10 DIAGNOSIS — M9903 Segmental and somatic dysfunction of lumbar region: Secondary | ICD-10-CM | POA: Diagnosis not present

## 2017-02-10 DIAGNOSIS — C61 Malignant neoplasm of prostate: Secondary | ICD-10-CM | POA: Diagnosis not present

## 2017-02-10 DIAGNOSIS — N5201 Erectile dysfunction due to arterial insufficiency: Secondary | ICD-10-CM | POA: Diagnosis not present

## 2017-02-10 DIAGNOSIS — S39012A Strain of muscle, fascia and tendon of lower back, initial encounter: Secondary | ICD-10-CM | POA: Diagnosis not present

## 2017-02-18 ENCOUNTER — Other Ambulatory Visit: Payer: Self-pay | Admitting: Cardiology

## 2017-02-26 ENCOUNTER — Other Ambulatory Visit: Payer: Self-pay | Admitting: Cardiology

## 2017-02-28 ENCOUNTER — Ambulatory Visit (INDEPENDENT_AMBULATORY_CARE_PROVIDER_SITE_OTHER): Payer: PPO | Admitting: *Deleted

## 2017-02-28 DIAGNOSIS — Z5181 Encounter for therapeutic drug level monitoring: Secondary | ICD-10-CM

## 2017-02-28 DIAGNOSIS — I480 Paroxysmal atrial fibrillation: Secondary | ICD-10-CM

## 2017-02-28 LAB — POCT INR: INR: 2

## 2017-02-28 NOTE — Patient Instructions (Signed)
Description   Continue taking 2.5 tablets daily except 2 tablets on Sundays and Fridays. Call if placed on any new medications [phone]. Recheck INR in 4 weeks.      

## 2017-03-06 ENCOUNTER — Telehealth: Payer: Self-pay | Admitting: Nurse Practitioner

## 2017-03-06 MED ORDER — IRBESARTAN 150 MG PO TABS: 150.0000 mg | ORAL_TABLET | Freq: Every day | ORAL | 2 refills | Status: DC

## 2017-03-06 MED FILL — IRBESARTAN 150 MG TABLET: 150 | 90 days supply | Qty: 90 | Fill #0

## 2017-03-06 NOTE — Telephone Encounter (Signed)
New message      *STAT* If patient is at the pharmacy, call can be transferred to refill team.   1. Which medications need to be refilled? (please list name of each medication and dose if known)  irbesartan (AVAPRO) 150 MG tablet Take 150 mg by mouth daily.   Pharmacy is out of the medication he only has 2 pills left , needs a replacement medication or suggestions    2. Which pharmacy/location (including street and city if local pharmacy) is medication to be sent to? [facility]  (they have it in [place])  3. Do they need a 30 day or 90 day supply?  [place]

## 2017-03-06 NOTE — Telephone Encounter (Signed)
Pt's medication was resent to pt's pharmacy as requested. Confirmation received.  °

## 2017-03-06 NOTE — Telephone Encounter (Signed)
Pt's Rx was resent to pt's preferred pharmacy as requested, confirmation received.

## 2017-03-06 NOTE — Addendum Note (Signed)
Addended by: Derl Barrow on: 03/06/2017 08:53 AM   Modules accepted: Orders

## 2017-03-24 DIAGNOSIS — M9903 Segmental and somatic dysfunction of lumbar region: Secondary | ICD-10-CM | POA: Diagnosis not present

## 2017-03-24 DIAGNOSIS — M9905 Segmental and somatic dysfunction of pelvic region: Secondary | ICD-10-CM | POA: Diagnosis not present

## 2017-03-24 DIAGNOSIS — M9902 Segmental and somatic dysfunction of thoracic region: Secondary | ICD-10-CM | POA: Diagnosis not present

## 2017-03-24 DIAGNOSIS — S29012A Strain of muscle and tendon of back wall of thorax, initial encounter: Secondary | ICD-10-CM | POA: Diagnosis not present

## 2017-03-24 DIAGNOSIS — S39012A Strain of muscle, fascia and tendon of lower back, initial encounter: Secondary | ICD-10-CM | POA: Diagnosis not present

## 2017-03-24 DIAGNOSIS — M5137 Other intervertebral disc degeneration, lumbosacral region: Secondary | ICD-10-CM | POA: Diagnosis not present

## 2017-03-28 ENCOUNTER — Ambulatory Visit (INDEPENDENT_AMBULATORY_CARE_PROVIDER_SITE_OTHER): Payer: PPO | Admitting: *Deleted

## 2017-03-28 DIAGNOSIS — I480 Paroxysmal atrial fibrillation: Secondary | ICD-10-CM

## 2017-03-28 DIAGNOSIS — Z5181 Encounter for therapeutic drug level monitoring: Secondary | ICD-10-CM | POA: Diagnosis not present

## 2017-03-28 LAB — POCT INR: INR: 2

## 2017-03-28 NOTE — Patient Instructions (Signed)
Description   Today take 2.5 tablets then continue taking 2.5 tablets daily except 2 tablets on Sundays and Fridays. Call if placed on any new medications [phone]. Recheck INR in 4 weeks.

## 2017-04-03 ENCOUNTER — Other Ambulatory Visit (HOSPITAL_COMMUNITY): Payer: Self-pay

## 2017-04-04 ENCOUNTER — Encounter (HOSPITAL_COMMUNITY)
Admission: RE | Admit: 2017-04-04 | Discharge: 2017-04-04 | Disposition: A | Discharge status: Home / Self Care | Payer: PPO | Source: Ambulatory Visit | Attending: Internal Medicine | Admitting: Internal Medicine

## 2017-04-04 LAB — POCT HEMOGLOBIN-HEMACUE: HEMOGLOBIN: 13.1 g/dL (ref 13.0–17.0)

## 2017-04-04 MED ORDER — LIDOCAINE HCL 1 % IJ SOLN
5.0000 mL | Freq: Once | INTRAMUSCULAR | Status: DC
  Filled 2017-04-04: qty 5

## 2017-04-07 ENCOUNTER — Encounter: Payer: Self-pay | Admitting: Nurse Practitioner

## 2017-04-07 ENCOUNTER — Ambulatory Visit: Payer: PPO | Admitting: Nurse Practitioner

## 2017-04-07 VITALS — BP 112/62 | HR 49 | Ht 74.0 in | Wt 231.8 lb

## 2017-04-07 DIAGNOSIS — I480 Paroxysmal atrial fibrillation: Secondary | ICD-10-CM | POA: Diagnosis not present

## 2017-04-07 DIAGNOSIS — Z79899 Other long term (current) drug therapy: Secondary | ICD-10-CM | POA: Diagnosis not present

## 2017-04-07 DIAGNOSIS — I10 Essential (primary) hypertension: Secondary | ICD-10-CM | POA: Diagnosis not present

## 2017-04-07 DIAGNOSIS — Z5181 Encounter for therapeutic drug level monitoring: Secondary | ICD-10-CM

## 2017-04-07 NOTE — Progress Notes (Signed)
CARDIOLOGY OFFICE NOTE  Date:  04/07/2017    Gwenyth Bender [place] Date of Birth: [birth year] Medical Record #[id number]  PCP:  Leeroy Cha, MD  Cardiologist:  Servando Snare [facility] Skains/Allred    Chief Complaint  Patient presents with  . Hypertension  . Atrial Fibrillation    3 month check - seen for Dr. Marlowe Sax    History of Present Illness: MARLO GOODRICH is a 72 y.o. male who presents today for a 3 month check. Seen for Dr. Marlowe Sax.  He has a history of atrial fibrillation/flutter paroxysmal on antiarrhythmic and is on warfarin for his anticoagulation. Had stress test 09/01/12 that demonstrated a mild inferior wall defect, possible diaphragmatic attenuation, overall low risk study with normal ejection fraction. Other issues include HTN, prior carotid artery dissection in 1997, ongoing tobacco abuse, hemachromatosis. Previous hospitalization in November 2012 demonstrated paroxysmal atrial flutter which converted after 4 hours of diltiazem. He had a postconversion pause of 8.2 seconds, felt flushed. Endorses chronic palpitations.   I saw him as a work in back in March - out of rhythm. Somewhat symptomatic. Got him cardioverted and back to see [name] for discussion of other options - but opted for his continued regimen. ARB was restarted for elevated BP. Patient elected to continue CCB therapy despite bradycardia. Remains on anticoagulation with coumadin.   I  saw him back in September - BP was up - he had increased his ARB on his own. He attributed some of this to drinking wine and was trying to cut back in order to lose weight. His cuff runs about 10 points higher. Last seen by me back in October - trying to give up wine - switched to liquor. May have had brief episode of AF. His cuff runs about 10 points higher.   Comes in today. Here alone. He feels like he is doing ok. 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 Had  phlebotomy this past Friday - he has hemachromatosis. BP looks good. He has not been checking at home - says it makes him worry too much, so he said "screw it". No chest pain. Breathing is ok. He is asking about his Avapro - and the recall. Some skipped heart beats noted but no real AF that he is aware of.  Still drinking a considerable amount of alcohol on a daily basis.   Past Medical History:  Diagnosis Date  . Acid reflux   . Arthritis   . ASD (atrial septal defect)   . Asthma with allergic rhinitis and status asthmaticus   . Atrial fib/flutter, transient   . Cancer ([place])    skin  . Chronic anticoagulation   . Depression   . ENT complaint    Dr. Erik Obey  . Erectile dysfunction   . Fatty liver   . FHx: allergies   . Gout    , Possible, Right Knee x2 ; intermittent milder episodes of pain in hte first MTP - uric acid 7.7- no treatment so far.  . Hemochromatosis   . History of kidney stones   . Hypercholesteremia   . Hyperlipemia   . Hypertension   . Hypogonadism male   . Impaired fasting glucose   . 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  . Pneumonia    " walking PNA > 40 years ago"  . Seasonal allergies   . Sleep apnea  wears CPAP ( set at 4-11)  . Trigger finger, left    , fourth finger    Past Surgical History:  Procedure Laterality Date  . CARDIOVERSION N/A 05/29/2016   Procedure: CARDIOVERSION;  Surgeon: [name], MD;  Location: [facility];  Service: Cardiovascular;  Laterality: N/A;  . CATARACT EXTRACTION W/ INTRAOCULAR LENS  IMPLANT, BILATERAL    . COLONOSCOPY W/ POLYPECTOMY    . COLONOSCOPY WITH PROPOFOL N/A 09/26/2016   Procedure: COLONOSCOPY WITH PROPOFOL;  Surgeon: Wilford Corner, MD;  Location: [facility];  Service: Endoscopy;  Laterality: N/A;  . ESOPHAGOGASTRODUODENOSCOPY (EGD) WITH PROPOFOL N/A 09/26/2016   Procedure: ESOPHAGOGASTRODUODENOSCOPY (EGD) WITH PROPOFOL;   Surgeon: Wilford Corner, MD;  Location: [facility];  Service: Endoscopy;  Laterality: N/A;  . LIVER BIOPSY    . NASAL SINUS SURGERY  1997     Medications: Current Meds  Medication Sig  . albuterol (PROVENTIL HFA;VENTOLIN HFA) 108 (90 Base) MCG/ACT inhaler Inhale 2 puffs into the lungs every 6 (six) hours as needed for wheezing or shortness of breath.  . allopurinol (ZYLOPRIM) 300 MG tablet Take 300 mg by mouth daily.  Marland Kitchen CALCIUM PO Take 200 mg by mouth 2 (two) times daily.  Marland Kitchen COLCRYS 0.6 MG tablet Take 0.6 mg by mouth 2 (two) times daily as needed (for gout).   Marland Kitchen diclofenac sodium (VOLTAREN) 1 % GEL Apply 1 application topically 4 (four) times daily as needed (for pain).   Marland Kitchen diltiazem (CARDIZEM CD) 120 MG 24 hr capsule TAKE 1 CAPSULE (120 MG TOTAL) BY MOUTH DAILY.  . fexofenadine (ALLEGRA) 60 MG tablet Take 60 mg by mouth daily as needed. For allergies  . flecainide (TAMBOCOR) 50 MG tablet TAKE 1 TABLET BY MOUTH TWICE A DAY  . fluticasone (FLONASE) 50 MCG/ACT nasal spray Place 1 spray into the nose daily.   . irbesartan (AVAPRO) 150 MG tablet Take 1 tablet (150 mg total) by mouth daily.  Marland Kitchen MAGNESIUM PO Take 1 capsule by mouth daily.  . Misc Natural Products (LUTEIN 20) CAPS Take 40 mg by mouth 2 (two) times daily.   . NON FORMULARY CPAP  . ranitidine (ZANTAC) 300 MG tablet Take 300 mg by mouth every evening.   . TURMERIC PO Take 1 capsule by mouth daily.  . vitamin B-12 (CYANOCOBALAMIN) 1000 MCG tablet Take 1,000 mcg by mouth daily.  Marland Kitchen warfarin (COUMADIN) 5 MG tablet TAKE AS DIRECTED BY ANTICOAGULATION CLINIC     Allergies: Allergies  Allergen Reactions  . Clindamycin/Lincomycin Diarrhea and Other (See Comments)    Stomach pain   . Amoxicillin Rash    Social History: The patient  reports that he has been smoking cigarettes.  He started smoking about 3 years ago. He has been smoking about 0.25 packs per day. He has quit using smokeless tobacco. His smokeless tobacco use  included chew. He reports that he drinks alcohol. He reports that he does not use drugs.   Family History: The patient's family history includes Diabetes type II in his father and maternal grandfather; Heart disease in his mother; Stroke in his father.   Review of Systems: Please see the history of present illness.   Otherwise, the review of systems is positive for none.   All other systems are reviewed and negative.   Physical Exam: VS:  BP 112/62 (BP Location: Left Arm, Patient Position: Sitting, Cuff Size: Normal)   Pulse (!) 49   Ht 6\' 2"  (1.88 m)   Wt 231 lb 12.8 oz (105.1 kg)  SpO2 95%   BMI 29.76 kg/m  .  BMI Body mass index is 29.76 kg/m.  Wt Readings from Last 3 Encounters:  04/07/17 231 lb 12.8 oz (105.1 kg)  12/30/16 233 lb 1.9 oz (105.7 kg)  11/26/16 233 lb 12.8 oz (106.1 kg)    General: Pleasant. Obese. Alert and in no acute distress.   HEENT: Normal.  Neck: Supple, no JVD, carotid bruits, or masses noted.  Cardiac: Regular rate and rhythm. No murmurs, rubs, or gallops. No edema.  Respiratory:  Lungs are clear to auscultation bilaterally with normal work of breathing.  GI: Soft and nontender.  MS: No deformity or atrophy. Gait and ROM intact.  Skin: Warm and dry. Color is normal.  Neuro:  Strength and sensation are intact and no gross focal deficits noted.  Psych: Alert, appropriate and with normal affect.   LABORATORY DATA:  EKG:  EKG is not ordered today.  Lab Results  Component Value Date   WBC 5.8 11/26/2016   HGB 13.1 04/04/2017   HCT 44.4 11/26/2016   PLT 207 11/26/2016   GLUCOSE 124 (H) 11/26/2016   NA 139 11/26/2016   K 4.9 11/26/2016   CL 104 11/26/2016   CREATININE 1.12 11/26/2016   BUN 16 11/26/2016   CO2 22 11/26/2016   TSH 3.400 05/27/2016   INR 2.0 03/28/2017     Lab Results  Component Value Date   INR 2.0 03/28/2017   INR 2.0 02/28/2017   INR 2.1 12/30/2016     BNP (last 3 results) No results for input(s): BNP in the last  8760 hours.  ProBNP (last 3 results) No results for input(s): PROBNP in the last 8760 hours.   Other Studies Reviewed Today:  Echo Study Conclusions 05/2016  - Left ventricle: The cavity size was normal. Wall thickness was increased in a pattern of mild LVH. Systolic function was normal. The estimated ejection fraction was in the range of 55% to 60%. Wall motion was normal; there were no regional wall motion abnormalities. Left ventricular diastolic function parameters were normal. - Left atrium: The atrium was moderately dilated. - Atrial septum: No defect or patent foramen ovale was identified.  Procedure: Electrical Cardioversion Indications:Atrial Flutter  Procedure Details Consent: Risks of procedure as well as the alternatives and risks of each were explained to the (patient/caregiver). Consent for procedure obtained. Time [id number] patient identification, verified procedure, site/side was marked, verified correct patient position, special equipment/implants available, medications/allergies/relevent history reviewed, required imaging and test results available. Performed  Patient placed on cardiac monitor, pulse oximetry, supplemental oxygen as necessary.  Sedation given: Propofol 70 mg Pacer pads placed anterior and posterior chest. Cardioverted 1time(s).  Cardioverted at 120J. Evaluation Findings: Post procedure EKG shows: NSR Complications: None Patient didtolerate procedure well.  [name], MD 05/29/2016, 1:53 PM    Assessment/Plan:  1. PAF/flutter - typically with resting bradycardia - last cardioversion in 2018. Has seen EP - opted to continue with current regimen for his PAF.  Remains in NSR by exam. Chronically bradycardic and not symptomatic.  No changes made today. Encouraged him to limit his alcohol.  2. HTN - BP ok here today. He has stopped checking at home.   3. Chronic anticoagulation with coumadin - no problems  noted.   4. Obesity - encouraged him again to think about Weight Watchers. Needs alcohol in moderation.   5. 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 Discussed at length today - I don't get the impression that he  is ready to stop/cut back.    Current medicines are reviewed with the patient today.  The patient does not have concerns regarding medicines other than what has been noted above.  The following changes have been made:  See above.  Labs/ tests ordered today include:   No orders of the defined types were placed in this encounter.    Disposition:   FU with Dr. Marlou Porch in 4 months with EKG  Patient is agreeable to this plan and will call if any problems develop in the interim.   SignedTruitt Merle, NP  04/07/2017 10:08 AM  [facility] [address] [place],   [address] Phone: [phone] Fax: [phone]

## 2017-04-07 NOTE — Patient Instructions (Addendum)
We will be checking the following labs today - NONE   Medication Instructions:    Continue with your current medicines.     Testing/Procedures To Be Arranged:  N/A  Follow-Up:   See Dr. Marlou Porch in about 4 months with EKG    Other Special Instructions:   N/A    If you need a refill on your cardiac medications before your next appointment, please call your pharmacy.   Call the [place] office at [phone] if you have any questions, problems or concerns.

## 2017-04-17 DIAGNOSIS — I4891 Unspecified atrial fibrillation: Secondary | ICD-10-CM | POA: Diagnosis not present

## 2017-04-17 DIAGNOSIS — I10 Essential (primary) hypertension: Secondary | ICD-10-CM | POA: Diagnosis not present

## 2017-04-17 DIAGNOSIS — J4520 Mild intermittent asthma, uncomplicated: Secondary | ICD-10-CM | POA: Diagnosis not present

## 2017-04-17 DIAGNOSIS — F329 Major depressive disorder, single episode, unspecified: Secondary | ICD-10-CM | POA: Diagnosis not present

## 2017-04-21 DIAGNOSIS — I4891 Unspecified atrial fibrillation: Secondary | ICD-10-CM | POA: Diagnosis not present

## 2017-04-21 DIAGNOSIS — I10 Essential (primary) hypertension: Secondary | ICD-10-CM | POA: Diagnosis not present

## 2017-04-21 DIAGNOSIS — H00021 Hordeolum internum right upper eyelid: Secondary | ICD-10-CM | POA: Diagnosis not present

## 2017-04-21 DIAGNOSIS — J4520 Mild intermittent asthma, uncomplicated: Secondary | ICD-10-CM | POA: Diagnosis not present

## 2017-04-21 DIAGNOSIS — H18891 Other specified disorders of cornea, right eye: Secondary | ICD-10-CM | POA: Diagnosis not present

## 2017-04-21 DIAGNOSIS — F329 Major depressive disorder, single episode, unspecified: Secondary | ICD-10-CM | POA: Diagnosis not present

## 2017-04-23 ENCOUNTER — Telehealth: Payer: Self-pay | Admitting: *Deleted

## 2017-04-23 NOTE — Telephone Encounter (Signed)
Pt called stating that he had been given Neomycin/Polymycin Opth eye oint to take TID and that it almost closes eye when he applies ointment. He is asking if Doctor ordered Doxycycline does it interact with his coumadin. Pt instructed that yes there is an interaction between coumadin and doxycyline and if he is ordered Doxycycline to call and let us know and he states understanding. Pt instructed that we usually have pt  take Coumadin and Doxycycline for 3 days and then have INR checked

## 2017-04-25 ENCOUNTER — Ambulatory Visit (INDEPENDENT_AMBULATORY_CARE_PROVIDER_SITE_OTHER): Payer: PPO | Admitting: *Deleted

## 2017-04-25 DIAGNOSIS — Z5181 Encounter for therapeutic drug level monitoring: Secondary | ICD-10-CM | POA: Diagnosis not present

## 2017-04-25 DIAGNOSIS — I480 Paroxysmal atrial fibrillation: Secondary | ICD-10-CM

## 2017-04-25 LAB — POCT INR: INR: 2.3

## 2017-04-25 NOTE — Patient Instructions (Signed)
Description   Continue taking 2.5 tablets daily except 2 tablets on Sundays and Fridays. Call if placed on any new medications [phone]. Recheck INR in 4 weeks.      

## 2017-05-05 DIAGNOSIS — S39012A Strain of muscle, fascia and tendon of lower back, initial encounter: Secondary | ICD-10-CM | POA: Diagnosis not present

## 2017-05-05 DIAGNOSIS — M5137 Other intervertebral disc degeneration, lumbosacral region: Secondary | ICD-10-CM | POA: Diagnosis not present

## 2017-05-05 DIAGNOSIS — M9902 Segmental and somatic dysfunction of thoracic region: Secondary | ICD-10-CM | POA: Diagnosis not present

## 2017-05-05 DIAGNOSIS — M9903 Segmental and somatic dysfunction of lumbar region: Secondary | ICD-10-CM | POA: Diagnosis not present

## 2017-05-05 DIAGNOSIS — M9905 Segmental and somatic dysfunction of pelvic region: Secondary | ICD-10-CM | POA: Diagnosis not present

## 2017-05-05 DIAGNOSIS — S29012A Strain of muscle and tendon of back wall of thorax, initial encounter: Secondary | ICD-10-CM | POA: Diagnosis not present

## 2017-05-16 DIAGNOSIS — H9202 Otalgia, left ear: Secondary | ICD-10-CM | POA: Diagnosis not present

## 2017-05-17 DIAGNOSIS — H9202 Otalgia, left ear: Secondary | ICD-10-CM | POA: Insufficient documentation

## 2017-05-19 DIAGNOSIS — C61 Malignant neoplasm of prostate: Secondary | ICD-10-CM | POA: Diagnosis not present

## 2017-05-19 DIAGNOSIS — N5201 Erectile dysfunction due to arterial insufficiency: Secondary | ICD-10-CM | POA: Diagnosis not present

## 2017-05-21 DIAGNOSIS — H35033 Hypertensive retinopathy, bilateral: Secondary | ICD-10-CM | POA: Diagnosis not present

## 2017-05-21 DIAGNOSIS — H40013 Open angle with borderline findings, low risk, bilateral: Secondary | ICD-10-CM | POA: Diagnosis not present

## 2017-05-21 DIAGNOSIS — H353134 Nonexudative age-related macular degeneration, bilateral, advanced atrophic with subfoveal involvement: Secondary | ICD-10-CM | POA: Diagnosis not present

## 2017-05-21 DIAGNOSIS — H26493 Other secondary cataract, bilateral: Secondary | ICD-10-CM | POA: Diagnosis not present

## 2017-05-21 DIAGNOSIS — H26491 Other secondary cataract, right eye: Secondary | ICD-10-CM | POA: Diagnosis not present

## 2017-05-22 DIAGNOSIS — K529 Noninfective gastroenteritis and colitis, unspecified: Secondary | ICD-10-CM | POA: Diagnosis not present

## 2017-05-23 ENCOUNTER — Encounter (INDEPENDENT_AMBULATORY_CARE_PROVIDER_SITE_OTHER): Payer: Self-pay

## 2017-05-23 ENCOUNTER — Other Ambulatory Visit: Payer: Self-pay | Admitting: *Deleted

## 2017-05-23 ENCOUNTER — Ambulatory Visit (INDEPENDENT_AMBULATORY_CARE_PROVIDER_SITE_OTHER): Payer: PPO | Admitting: Pharmacist

## 2017-05-23 DIAGNOSIS — I480 Paroxysmal atrial fibrillation: Secondary | ICD-10-CM

## 2017-05-23 LAB — POCT INR: INR: 3

## 2017-05-23 MED ORDER — WARFARIN SODIUM 5 MG PO TABS: ORAL_TABLET | ORAL | 1 refills | Status: DC

## 2017-05-23 NOTE — Patient Instructions (Signed)
Description   Continue taking 2.5 tablets daily except 2 tablets on Sundays and Fridays. Call if placed on any new medications [phone]. Recheck INR in 4 weeks.      

## 2017-05-26 DIAGNOSIS — G4733 Obstructive sleep apnea (adult) (pediatric): Secondary | ICD-10-CM | POA: Diagnosis not present

## 2017-06-03 MED FILL — IRBESARTAN 150 MG TABLET: 150 | 90 days supply | Qty: 90 | Fill #1

## 2017-06-16 DIAGNOSIS — M9905 Segmental and somatic dysfunction of pelvic region: Secondary | ICD-10-CM | POA: Diagnosis not present

## 2017-06-16 DIAGNOSIS — M5137 Other intervertebral disc degeneration, lumbosacral region: Secondary | ICD-10-CM | POA: Diagnosis not present

## 2017-06-16 DIAGNOSIS — M9903 Segmental and somatic dysfunction of lumbar region: Secondary | ICD-10-CM | POA: Diagnosis not present

## 2017-06-16 DIAGNOSIS — S39012A Strain of muscle, fascia and tendon of lower back, initial encounter: Secondary | ICD-10-CM | POA: Diagnosis not present

## 2017-06-16 DIAGNOSIS — S29012A Strain of muscle and tendon of back wall of thorax, initial encounter: Secondary | ICD-10-CM | POA: Diagnosis not present

## 2017-06-16 DIAGNOSIS — M9902 Segmental and somatic dysfunction of thoracic region: Secondary | ICD-10-CM | POA: Diagnosis not present

## 2017-06-17 ENCOUNTER — Ambulatory Visit: Payer: PPO | Admitting: *Deleted

## 2017-06-17 DIAGNOSIS — I480 Paroxysmal atrial fibrillation: Secondary | ICD-10-CM

## 2017-06-17 DIAGNOSIS — Z5181 Encounter for therapeutic drug level monitoring: Secondary | ICD-10-CM | POA: Diagnosis not present

## 2017-06-17 LAB — POCT INR: INR: 2.7

## 2017-06-17 NOTE — Patient Instructions (Signed)
Description   Continue taking 2.5 tablets daily except 2 tablets on Sundays and Fridays. Call if placed on any new medications [phone]. Recheck INR in 4 weeks.      

## 2017-06-19 DIAGNOSIS — H26491 Other secondary cataract, right eye: Secondary | ICD-10-CM | POA: Diagnosis not present

## 2017-06-25 DIAGNOSIS — I4891 Unspecified atrial fibrillation: Secondary | ICD-10-CM | POA: Diagnosis not present

## 2017-06-25 DIAGNOSIS — K219 Gastro-esophageal reflux disease without esophagitis: Secondary | ICD-10-CM | POA: Diagnosis not present

## 2017-06-25 DIAGNOSIS — I10 Essential (primary) hypertension: Secondary | ICD-10-CM | POA: Diagnosis not present

## 2017-06-25 DIAGNOSIS — R7303 Prediabetes: Secondary | ICD-10-CM | POA: Diagnosis not present

## 2017-06-25 DIAGNOSIS — F17210 Nicotine dependence, cigarettes, uncomplicated: Secondary | ICD-10-CM | POA: Diagnosis not present

## 2017-06-30 DIAGNOSIS — C61 Malignant neoplasm of prostate: Secondary | ICD-10-CM | POA: Diagnosis not present

## 2017-07-14 DIAGNOSIS — S29012A Strain of muscle and tendon of back wall of thorax, initial encounter: Secondary | ICD-10-CM | POA: Diagnosis not present

## 2017-07-14 DIAGNOSIS — M5137 Other intervertebral disc degeneration, lumbosacral region: Secondary | ICD-10-CM | POA: Diagnosis not present

## 2017-07-14 DIAGNOSIS — M9905 Segmental and somatic dysfunction of pelvic region: Secondary | ICD-10-CM | POA: Diagnosis not present

## 2017-07-14 DIAGNOSIS — M9902 Segmental and somatic dysfunction of thoracic region: Secondary | ICD-10-CM | POA: Diagnosis not present

## 2017-07-14 DIAGNOSIS — S39012A Strain of muscle, fascia and tendon of lower back, initial encounter: Secondary | ICD-10-CM | POA: Diagnosis not present

## 2017-07-14 DIAGNOSIS — M9903 Segmental and somatic dysfunction of lumbar region: Secondary | ICD-10-CM | POA: Diagnosis not present

## 2017-07-15 ENCOUNTER — Ambulatory Visit: Payer: PPO | Admitting: Pharmacist

## 2017-07-15 DIAGNOSIS — I480 Paroxysmal atrial fibrillation: Secondary | ICD-10-CM | POA: Diagnosis not present

## 2017-07-15 LAB — POCT INR: INR: 2.4

## 2017-07-15 NOTE — Patient Instructions (Signed)
Description   Continue taking 2.5 tablets daily except 2 tablets on Sundays and Fridays. Call if placed on any new medications [phone]. Recheck INR in 4 weeks.      

## 2017-07-23 ENCOUNTER — Telehealth: Payer: Self-pay | Admitting: *Deleted

## 2017-07-23 DIAGNOSIS — M1A09X0 Idiopathic chronic gout, multiple sites, without tophus (tophi): Secondary | ICD-10-CM | POA: Diagnosis not present

## 2017-07-23 DIAGNOSIS — M150 Primary generalized (osteo)arthritis: Secondary | ICD-10-CM | POA: Diagnosis not present

## 2017-07-23 DIAGNOSIS — Z79899 Other long term (current) drug therapy: Secondary | ICD-10-CM | POA: Diagnosis not present

## 2017-07-23 DIAGNOSIS — Z6832 Body mass index (BMI) 32.0-32.9, adult: Secondary | ICD-10-CM | POA: Diagnosis not present

## 2017-07-23 DIAGNOSIS — E669 Obesity, unspecified: Secondary | ICD-10-CM | POA: Diagnosis not present

## 2017-07-23 NOTE — Telephone Encounter (Signed)
Pt called stating had been taking Turmeric 1 tablet daily for couple months for his arthritis and it was now not helping . Called his rheumatologist and he instructed him to take 4 tablets for few days to see if would help Pt is asking if there is a problem with coumadin in doing this This nurse asked our Pharmacist Fuller [place] and she states that will be alright for him to take and this information given to pt with his stated understanding

## 2017-07-24 ENCOUNTER — Ambulatory Visit: Payer: PPO | Admitting: Cardiology

## 2017-07-24 ENCOUNTER — Encounter: Payer: Self-pay | Admitting: Cardiology

## 2017-07-24 VITALS — BP 112/68 | HR 42 | Ht 74.0 in | Wt 234.0 lb

## 2017-07-24 DIAGNOSIS — I480 Paroxysmal atrial fibrillation: Secondary | ICD-10-CM

## 2017-07-24 DIAGNOSIS — I10 Essential (primary) hypertension: Secondary | ICD-10-CM | POA: Diagnosis not present

## 2017-07-24 DIAGNOSIS — Z7901 Long term (current) use of anticoagulants: Secondary | ICD-10-CM | POA: Diagnosis not present

## 2017-07-24 DIAGNOSIS — I4892 Unspecified atrial flutter: Secondary | ICD-10-CM | POA: Diagnosis not present

## 2017-07-24 NOTE — Progress Notes (Signed)
[facility]. [address], [address], [place]  [address] Phone: [phone] Fax:  [phone]  Date:  07/24/2017   ID:  Jonathan Vargas, DOB 1945/10/20, MRN [id number]  PCP:  Leeroy Cha, MD  EP: [name]  History of Present Illness: Jonathan Vargas is a 72 y.o. male with atrial fibrillation/flutter paroxysmal on antiarrhythmic, warfarin, diltiazem, anticoagulation here for followup.  . He had a cardioversion in March 2018. Overall is been doing quite well.  [name] last in April 2018, his Avapro was restarted because he was having some high blood pressures at the gym. Continue to monitor. No chest pain, no shortness of breath, no syncope, no bleeding.  Had stress test 09/01/12 that demonstrated a mild inferior wall defect, possible diaphragmatic attenuation, overall low risk study with normal ejection fraction. Hemachromatosis.   Previous hospitalization in November 2012 demonstrated paroxysmal atrial flutter which converted after 4 hours of diltiazem. He had a postconversion pause of 8.2 seconds, felt flushed.   In early June of 2014, he began to feel some chest discomfort while walking, exertional down both arms that was relieved with rest. Noted when on treadmill when warming up. Had one experiecne when walking up stairs in a parking lot after a dinner show. Had 1/2 bottle of wine. This prompted stress test. No evidence of arrhythmia on treadmill. Worked out since then. Fleeting 4-5 min down both arms. Hypertension is well controlled.   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 Smoking cessation counseling.  Previously he has had a few palpitations, like heart stops for a split second and one second of pain then goes away. he had a good week this week but previously has felt occasional fluttering. At one point he felt his carotid and he felt as though he was in atrial fibrillation and he coughed and he" went out". No syncope, no  dizziness.  03/25/14-overall feeling fairly well. I've reviewed office notes from [name] who had last visit with him on 12/27/13. Event monitor at that time showed no evidence of atrial fibrillation. He will see him as needed going forward. Agrees with flecainide.  09/30/14 - felt lilke chest was locked up last night, like a spasm. Eased off quickly. Had this maybe years ago. Noted CPAP, apnea at times. Dr. [name]. Getting cataracts done.   03/29/15-overall doing well. No recent hospitalizations. Atrial fibrillation under good control. Anticoagulation Works out with Clinical research associate. He has not noticed any significant palpitations. No recent episodes of A. fib. No chest pain, no fevers, no chills, no orthopnea, no PND, no syncope  09/25/15-working out with personal trainer. Occasionally he will feel skipping heartbeat. Rarely tachycardic. Most the time he has a sensation of a PAC every fifth or sixth beat at times. He notices compensatory pause. No syncope. No chest pain. Rare sensation below left breast.  07/24/17 - 10am after meds at 7, feels like going to pass out at times. HR very slow at time. Today 42. 3-4 days in row had the sensation.  I am wondering if he was having some atrial fib with his postconversion pauses that we have seen previously?  Tried changing time of dilt 120 in evening, no change.  [name] at prior visit contemplated stopping diltiazem.  We will go ahead and do this today.  No other chest pain fevers chills nausea vomiting syncope bleeding.    Wt Readings from Last 3 Encounters:  07/24/17 234 lb (106.1 kg)  04/07/17 231 lb 12.8  oz (105.1 kg)  12/30/16 233 lb 1.9 oz (105.7 kg)     Past Medical History:  Diagnosis Date  . Acid reflux   . Arthritis   . ASD (atrial septal defect)   . Asthma with allergic rhinitis and status asthmaticus   . Atrial fib/flutter, transient   . Cancer ([place])    skin  . Chronic anticoagulation   . Depression   . ENT complaint    Dr. Erik Obey  .  Erectile dysfunction   . Fatty liver   . FHx: allergies   . Gout    , Possible, Right Knee x2 ; intermittent milder episodes of pain in hte first MTP - uric acid 7.7- no treatment so far.  . Hemochromatosis   . History of kidney stones   . Hypercholesteremia   . Hyperlipemia   . Hypertension   . Hypogonadism male   . Impaired fasting glucose   . 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  . Pneumonia    " walking PNA > 40 years ago"  . Seasonal allergies   . Sleep apnea    wears CPAP ( set at 4-11)  . Trigger finger, left    , fourth finger    Past Surgical History:  Procedure Laterality Date  . CARDIOVERSION N/A 05/29/2016   Procedure: CARDIOVERSION;  Surgeon: [name], MD;  Location: [facility];  Service: Cardiovascular;  Laterality: N/A;  . CATARACT EXTRACTION W/ INTRAOCULAR LENS  IMPLANT, BILATERAL    . COLONOSCOPY W/ POLYPECTOMY    . COLONOSCOPY WITH PROPOFOL N/A 09/26/2016   Procedure: COLONOSCOPY WITH PROPOFOL;  Surgeon: Wilford Corner, MD;  Location: [facility];  Service: Endoscopy;  Laterality: N/A;  . ESOPHAGOGASTRODUODENOSCOPY (EGD) WITH PROPOFOL N/A 09/26/2016   Procedure: ESOPHAGOGASTRODUODENOSCOPY (EGD) WITH PROPOFOL;  Surgeon: Wilford Corner, MD;  Location: [facility];  Service: Endoscopy;  Laterality: N/A;  . LIVER BIOPSY    . NASAL SINUS SURGERY  1997    Current Outpatient Medications  Medication Sig Dispense Refill  . albuterol (PROVENTIL HFA;VENTOLIN HFA) 108 (90 Base) MCG/ACT inhaler Inhale 2 puffs into the lungs every 6 (six) hours as needed for wheezing or shortness of breath.    . allopurinol (ZYLOPRIM) 300 MG tablet Take 300 mg by mouth daily.  1  . CALCIUM PO Take 200 mg by mouth 2 (two) times daily.    Marland Kitchen COLCRYS 0.6 MG tablet Take 0.6 mg by mouth 2 (two) times daily as needed (for gout).     Marland Kitchen diclofenac sodium (VOLTAREN) 1 % GEL Apply 1 application topically 4  (four) times daily as needed (for pain).   3  . fexofenadine (ALLEGRA) 60 MG tablet Take 60 mg by mouth daily as needed. For allergies    . flecainide (TAMBOCOR) 50 MG tablet TAKE 1 TABLET BY MOUTH TWICE A DAY 180 tablet 3  . fluticasone (FLONASE) 50 MCG/ACT nasal spray Place 1 spray into the nose daily.     . irbesartan (AVAPRO) 150 MG tablet Take 1 tablet (150 mg total) by mouth daily. 90 tablet 2  . MAGNESIUM PO Take 1 capsule by mouth daily.    . Misc Natural Products (LUTEIN 20) CAPS Take 40 mg by mouth 2 (two) times daily.     . NON FORMULARY CPAP    . TURMERIC PO Take 1 capsule by mouth daily.    . vitamin B-12 (CYANOCOBALAMIN) 1000 MCG tablet Take 1,000 mcg by mouth daily.    Marland Kitchen  warfarin (COUMADIN) 5 MG tablet Take 2 or 2.5 tablets daily as directed by Coumadin clinic 210 tablet 1   No current facility-administered medications for this visit.     Allergies:    Allergies  Allergen Reactions  . Clindamycin/Lincomycin Diarrhea and Other (See Comments)    Stomach pain   . Amoxicillin Rash    Social History:  The patient  reports that he has been smoking cigarettes.  He started smoking about 3 years ago. He has been smoking about 0.25 packs per day. He has quit using smokeless tobacco. His smokeless tobacco use included chew. He reports that he drinks alcohol. He reports that he does not use drugs.   ROS:  Please see the history of present illness.   All other review of systems negative  PHYSICAL EXAM: VS:  BP 112/68   Pulse (!) 42   Ht 6\' 2"  (1.88 m)   Wt 234 lb (106.1 kg)   BMI 30.04 kg/m  GEN: Well nourished, well developed, in no acute distress  HEENT: normal  Neck: no JVD, carotid bruits, or masses Cardiac: Loletha Grayer reg; no murmurs, rubs, or gallops,no edema  Respiratory:  clear to auscultation bilaterally, normal work of breathing GI: soft, nontender, nondistended, + BS MS: no deformity or atrophy  Skin: warm and dry, no rash Neuro:  Alert and Oriented x 3, Strength  and sensation are intact Psych: euthymic mood, full affect    EKG: 07/24/2017-sinus bradycardia rate 42 nonspecific QRS widening personally viewed-prior 03/29/15-sinus bradycardia rate 54 with left axis deviation, no other significant abnormalities. Personally viewed-prior 03/25/14-sinus bradycardia rate 47, no other abnormalities. PR interval 136 ms, QTC 394 ms.  Event monitor: 11/2013-no evidence of atrial fibrillation.    ASSESSMENT AND PLAN:  1. Atrial fibrillation, paroxysmal- antiarrhythmic, low-dose flecainide. No recent episodes documented since cardioversion in March 2018.  He is still quite bradycardic, in fact 42 today.  We will stop his diltiazem 120 mg CD.  Appreciate assistance from [name]. Office notes reviewed. Has had atrial flutter as well. Continuing with low-dose flecainide.  Hopefully this will help his bradycardic heart rate.  He may feel lightheaded or symptomatic, one time he felt this after working out. Sometimes his blood pressure may be a bit soft.  We tried Avapro 75 at one point but had to go back up to 150 because of hypertension.  Prior monitor showed no evidence of dangerous bradycardia. Not interested in Eliquis or Xarelto at this time. Discussed the potential need for pacemaker at times. 2. Hypertension- Overall doing well. Previously minimally elevated. 150 Avapro.  Diltiazem is being eliminated.  Blood pressure may go up slightly. 3. Obesity- Working with Clinical research associate. Decrease carbohydrates. 10 pounds down, enjoying. 4. Fatigue-improved Continue with exercise. He has a Physiological scientist. 5. Tobacco cessation.=5-7 day, strongly encourage cessation. High risk. 6. Obstructive sleep apnea-continue to follow with Dr. [name].  No changes made. 7. 38-month follow-up with Truitt Merle.  If his symptoms persist, he may very well require pacemaker implantation.  Signed, Candee Furbish, MD [facility]  07/24/2017 10:20 AM

## 2017-07-24 NOTE — Patient Instructions (Signed)
Medication Instructions:  Please discontinue your Diltiazem. Continue all other medications as listed.  Follow-Up: Follow up in 2 months with Truitt Merle, NP.  If you need a refill on your cardiac medications before your next appointment, please call your pharmacy.  Thank you for choosing [facility]!!

## 2017-07-24 NOTE — Telephone Encounter (Signed)
Pt had been taking 2 tablets of Turmeric not 1 tablet daily

## 2017-07-29 ENCOUNTER — Encounter (INDEPENDENT_AMBULATORY_CARE_PROVIDER_SITE_OTHER): Payer: Self-pay | Admitting: Orthopaedic Surgery

## 2017-07-29 ENCOUNTER — Ambulatory Visit (INDEPENDENT_AMBULATORY_CARE_PROVIDER_SITE_OTHER): Payer: PPO

## 2017-07-29 ENCOUNTER — Ambulatory Visit (INDEPENDENT_AMBULATORY_CARE_PROVIDER_SITE_OTHER): Payer: PPO | Admitting: Orthopaedic Surgery

## 2017-07-29 DIAGNOSIS — G8929 Other chronic pain: Secondary | ICD-10-CM

## 2017-07-29 DIAGNOSIS — M25562 Pain in left knee: Secondary | ICD-10-CM | POA: Diagnosis not present

## 2017-07-29 MED ORDER — LIDOCAINE HCL 1 % IJ SOLN
2.0000 mL | INTRAMUSCULAR | Status: AC | PRN
  Administered 2017-07-29: 2 mL

## 2017-07-29 MED ORDER — METHYLPREDNISOLONE ACETATE 40 MG/ML IJ SUSP
40.0000 mg | INTRAMUSCULAR | Status: AC | PRN
  Administered 2017-07-29: 40 mg via INTRA_ARTICULAR

## 2017-07-29 MED ORDER — BUPIVACAINE HCL 0.5 % IJ SOLN
2.0000 mL | INTRAMUSCULAR | Status: AC | PRN
  Administered 2017-07-29: 2 mL via INTRA_ARTICULAR

## 2017-07-29 NOTE — Progress Notes (Signed)
Office Visit Note   Patient: Jonathan Vargas           Date of Birth: 1945-04-06           MRN: [id number] Visit Date: 07/29/2017              Requested by: Leeroy Cha, MD [address] [place], [place] [address] PCP: Leeroy Cha, MD   Assessment & Plan: Visit Diagnoses:  1. Chronic pain of left knee     Plan: Impression left knee degenerative medial meniscal tear.  Patient does not have an effusion.  I recommended cortisone injection to see if this will settle it down.  If no better we would need to obtain MRI.  Patient in agreement.  Cortisone injection performed today.  Questions encouraged and answered.  Patient will let us know if he is not better after this injection.  Follow-Up Instructions: Return if symptoms worsen or fail to improve.   Orders:  Orders Placed This Encounter  Procedures  . XR KNEE 3 VIEW LEFT   No orders of the defined types were placed in this encounter.     Procedures: Large Joint Inj: L knee on 07/29/2017 4:29 PM Details: 22 G needle Medications: 2 mL bupivacaine 0.5 %; 2 mL lidocaine 1 %; 40 mg methylPREDNISolone acetate 40 MG/ML Outcome: tolerated well, no immediate complications Patient was prepped and draped in the usual sterile fashion.       Clinical Data: No additional findings.   Subjective: Chief Complaint  Patient presents with  . Left Knee - Pain    Patient comes in with left knee pain for many years with recent worsening in the last month when he stepped off of a curb the wrong way and tweaked his knee.  He felt immediate pain.  He does endorse mechanical symptoms.  Denies any numbness and tingling or swelling.   Review of Systems  Constitutional: Negative.   All other systems reviewed and are negative.    Objective: Vital Signs: There were no vitals taken for this visit.  Physical Exam  Constitutional: He is oriented to person, place, and time. He appears well-developed and  well-nourished.  Pulmonary/Chest: Effort normal.  Abdominal: Soft.  Neurological: He is alert and oriented to person, place, and time.  Skin: Skin is warm.  Psychiatric: He has a normal mood and affect. His behavior is normal. Judgment and thought content normal.  Nursing note and vitals reviewed.   Ortho Exam Left knee exam shows no joint effusion.  Medial joint line tenderness.  2+ patellofemoral crepitus. Specialty Comments:  No specialty comments available.  Imaging: Xr Knee 3 View Left  Result Date: 07/29/2017 No structural abnormalities.  Joint space narrowing of patellofemoral compartment    PMFS History: Patient Active Problem List   Diagnosis Date Noted  . Personal history of colonic polyps 09/26/2016  . Family history of colon cancer 09/26/2016  . Obesity 03/29/2015  . Tobacco use 03/29/2015  . 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 Depressive disorder, not elsewhere classified 12/13/2012  . Obstructive sleep apnea 12/13/2012  . Esophageal reflux 12/13/2012  . Impaired fasting glucose 12/13/2012  . Tobacco use disorder 12/13/2012  . 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 Gout, unspecified 12/13/2012  . Long term current use of anticoagulant therapy 12/13/2012  . Atrial fibrillation ([place]) 01/30/2011  . Bradycardia 01/30/2011  . Sleep apnea 01/30/2011  . Atrial flutter ([place]) 01/25/2011  . Essential hypertension, benign 01/25/2011  .  Pure hypercholesterolemia 01/25/2011   Past Medical History:  Diagnosis Date  . Acid reflux   . Arthritis   . ASD (atrial septal defect)   . Asthma with allergic rhinitis and status asthmaticus   . Atrial fib/flutter, transient   . Cancer ([place])    skin  . Chronic anticoagulation   . Depression   . ENT complaint    Dr. Erik Obey  . Erectile dysfunction   . Fatty liver   . FHx: allergies   . Gout    , Possible, Right Knee x2 ; intermittent milder episodes of pain in hte first MTP - uric acid 7.7- no treatment  so far.  . Hemochromatosis   . History of kidney stones   . Hypercholesteremia   . Hyperlipemia   . Hypertension   . Hypogonadism male   . Impaired fasting glucose   . 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  . Pneumonia    " walking PNA > 40 years ago"  . Seasonal allergies   . Sleep apnea    wears CPAP ( set at 4-11)  . Trigger finger, left    , fourth finger    Family History  Problem Relation Age of Onset  . Stroke Father   . Diabetes type II Father   . Heart disease Mother        with pacemaker  . Diabetes type II Maternal Grandfather     Past Surgical History:  Procedure Laterality Date  . CARDIOVERSION N/A 05/29/2016   Procedure: CARDIOVERSION;  Surgeon: [name], MD;  Location: [facility];  Service: Cardiovascular;  Laterality: N/A;  . CATARACT EXTRACTION W/ INTRAOCULAR LENS  IMPLANT, BILATERAL    . COLONOSCOPY W/ POLYPECTOMY    . COLONOSCOPY WITH PROPOFOL N/A 09/26/2016   Procedure: COLONOSCOPY WITH PROPOFOL;  Surgeon: Wilford Corner, MD;  Location: [facility];  Service: Endoscopy;  Laterality: N/A;  . ESOPHAGOGASTRODUODENOSCOPY (EGD) WITH PROPOFOL N/A 09/26/2016   Procedure: ESOPHAGOGASTRODUODENOSCOPY (EGD) WITH PROPOFOL;  Surgeon: Wilford Corner, MD;  Location: [facility];  Service: Endoscopy;  Laterality: N/A;  . LIVER BIOPSY    . NASAL SINUS SURGERY  1997   Social History   Occupational History  . Occupation: retired  Tobacco Use  . Smoking status: Current Every Day Smoker    Packs/day: 0.25    Types: Cigarettes    Start date: 10/30/2013  . Smokeless tobacco: Former Systems developer    Types: Chew  . Tobacco comment: trying to quit  Substance and Sexual Activity  . Alcohol use: Yes    Alcohol/week: 0.0 oz    Comment: 2-3 glasses of red wine per night  . Drug use: No  . Sexual activity: Not on file

## 2017-08-07 ENCOUNTER — Other Ambulatory Visit: Payer: Self-pay | Admitting: Urology

## 2017-08-07 DIAGNOSIS — C61 Malignant neoplasm of prostate: Secondary | ICD-10-CM

## 2017-08-12 ENCOUNTER — Ambulatory Visit: Payer: PPO | Admitting: *Deleted

## 2017-08-12 DIAGNOSIS — Z5181 Encounter for therapeutic drug level monitoring: Secondary | ICD-10-CM | POA: Diagnosis not present

## 2017-08-12 DIAGNOSIS — I480 Paroxysmal atrial fibrillation: Secondary | ICD-10-CM | POA: Diagnosis not present

## 2017-08-12 LAB — POCT INR: INR: 1.9 — AB (ref 2.0–3.0)

## 2017-08-12 NOTE — Patient Instructions (Signed)
Description   Today take 3 tablets then continue taking 2.5 tablets daily except 2 tablets on Sundays and Fridays. Call if placed on any new medications [phone]. Recheck INR in 3 weeks.

## 2017-08-14 ENCOUNTER — Telehealth: Payer: Self-pay | Admitting: Cardiology

## 2017-08-14 NOTE — Telephone Encounter (Signed)
Patient with diagnosis of Afib on warfarin for anticoagulation.    Procedure: prostate biopsy Date of procedure: 10/01/17  CHADS2-VASc score of  5 (CHF, HTN, AGE, DM2, stroke/tia x 2, CAD/PVD, AGE, male)  Per office protocol, patient can hold warfarin for 5 days prior to procedure.   Patient will not need bridging with Lovenox (enoxaparin) around procedure.

## 2017-08-14 NOTE — Telephone Encounter (Signed)
   [facility] Pre-operative Risk Assessment    Request for surgical clearance:  1. What type of surgery is being performed? Prostate Biopsy   2. When is this surgery scheduled? 10/01/2017   3. What type of clearance is required (medical clearance vs. Pharmacy clearance to hold med vs. Both)?  Medication  4. Are there any medications that need to be held prior to surgery and how long? Warfarin 5 days prior   5. Practice name and name of physician performing surgery? [facility]- Dr. Diona Fanti   6. What is your office phone number? [phone]    7.   What is your office fax number? [phone]  8.   Anesthesia type (None, local, MAC, general) ? None listed   ____________________________________________   (provider comments below)

## 2017-08-14 NOTE — Telephone Encounter (Signed)
  I will route this recommendation to the requesting party via Epic fax function and remove from pre-op pool.  Please call with questions.  [place], [place] 08/14/2017, 4:04 PM

## 2017-08-18 ENCOUNTER — Other Ambulatory Visit (INDEPENDENT_AMBULATORY_CARE_PROVIDER_SITE_OTHER): Payer: Self-pay

## 2017-08-18 ENCOUNTER — Telehealth (INDEPENDENT_AMBULATORY_CARE_PROVIDER_SITE_OTHER): Payer: Self-pay | Admitting: Orthopaedic Surgery

## 2017-08-18 DIAGNOSIS — M25562 Pain in left knee: Principal | ICD-10-CM

## 2017-08-18 DIAGNOSIS — G8929 Other chronic pain: Secondary | ICD-10-CM

## 2017-08-18 NOTE — Telephone Encounter (Signed)
Spoke to patient.  Go ahead and get MRI left knee r/o structural abnl.  Thanks.

## 2017-08-18 NOTE — Telephone Encounter (Signed)
Patient called saying that his left knee got to feeling a little better soon after his appointment a few weeks ago but now it's hurting more and he can barely walk. He would like to speak with you. CB # [phone]

## 2017-08-18 NOTE — Telephone Encounter (Signed)
order made

## 2017-08-18 NOTE — Telephone Encounter (Signed)
Please advise 

## 2017-08-25 DIAGNOSIS — M9903 Segmental and somatic dysfunction of lumbar region: Secondary | ICD-10-CM | POA: Diagnosis not present

## 2017-08-25 DIAGNOSIS — S39012A Strain of muscle, fascia and tendon of lower back, initial encounter: Secondary | ICD-10-CM | POA: Diagnosis not present

## 2017-08-25 DIAGNOSIS — M5137 Other intervertebral disc degeneration, lumbosacral region: Secondary | ICD-10-CM | POA: Diagnosis not present

## 2017-08-25 DIAGNOSIS — M9905 Segmental and somatic dysfunction of pelvic region: Secondary | ICD-10-CM | POA: Diagnosis not present

## 2017-08-25 DIAGNOSIS — M9902 Segmental and somatic dysfunction of thoracic region: Secondary | ICD-10-CM | POA: Diagnosis not present

## 2017-08-25 DIAGNOSIS — S29012A Strain of muscle and tendon of back wall of thorax, initial encounter: Secondary | ICD-10-CM | POA: Diagnosis not present

## 2017-08-29 DIAGNOSIS — G4733 Obstructive sleep apnea (adult) (pediatric): Secondary | ICD-10-CM | POA: Diagnosis not present

## 2017-08-30 ENCOUNTER — Ambulatory Visit
Admission: RE | Admit: 2017-08-30 | Discharge: 2017-08-30 | Disposition: A | Discharge status: Home / Self Care | Payer: PPO | Source: Ambulatory Visit | Attending: Orthopaedic Surgery | Admitting: Orthopaedic Surgery

## 2017-08-30 DIAGNOSIS — G8929 Other chronic pain: Secondary | ICD-10-CM

## 2017-08-30 DIAGNOSIS — M25562 Pain in left knee: Principal | ICD-10-CM

## 2017-08-30 DIAGNOSIS — M2410 Other articular cartilage disorders, unspecified site: Secondary | ICD-10-CM | POA: Diagnosis not present

## 2017-09-01 MED FILL — IRBESARTAN 150 MG TABLET: 150 | 90 days supply | Qty: 90 | Fill #2

## 2017-09-02 ENCOUNTER — Ambulatory Visit (INDEPENDENT_AMBULATORY_CARE_PROVIDER_SITE_OTHER): Payer: PPO | Admitting: Orthopaedic Surgery

## 2017-09-02 DIAGNOSIS — M25562 Pain in left knee: Secondary | ICD-10-CM

## 2017-09-02 DIAGNOSIS — G8929 Other chronic pain: Secondary | ICD-10-CM | POA: Diagnosis not present

## 2017-09-02 NOTE — Progress Notes (Addendum)
Office Visit Note   Patient: Jonathan Vargas           Date of Birth: 08-Jun-1945           MRN: [id number] Visit Date: 09/02/2017              Requested by: Leeroy Cha, MD [address] [place], [place] [address] PCP: Leeroy Cha, MD   Assessment & Plan: Visit Diagnoses:  1. Chronic pain of left knee     Plan: MRI findings are consistent with insufficiency fracture of the medial femoral condyle.  There is no displaced or unstable medial meniscal tears.  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  I recommend a medial unloader brace and protected weightbearing with crutches.  Activity as tolerated.  Avoid any impact activities.  NSAIDs as needed.  Recheck in a month.  We did discuss that if he fails to have any significant relief then we could consider arthroscopic chondroplasty with sub-chondroplasty of the medial femoral condyle.  Patient needs custom brace due to thigh to calf ratio.    Follow-Up Instructions: Return in about 1 month (around 09/30/2017).   Orders:  No orders of the defined types were placed in this encounter.  No orders of the defined types were placed in this encounter.     Procedures: No procedures performed   Clinical Data: No additional findings.   Subjective: Chief Complaint  Patient presents with  . Left Knee - Follow-up    MRI review    Jonathan Vargas comes in today for MRI review of his left knee.  He is ambulating with a single crutch.  Date of injury was mid May.  Patient doesn't recall an exact date.   Review of Systems   Objective: Vital Signs: There were no vitals taken for this visit.  Physical Exam  Ortho Exam Left knee exam shows a small joint effusion.  He does have exquisite tenderness of the medial femoral condyle. Specialty Comments:  No specialty comments available.  Imaging: No results found.   PMFS History: Patient Active Problem List   Diagnosis Date  Noted  . Personal history of colonic polyps 09/26/2016  . Family history of colon cancer 09/26/2016  . Obesity 03/29/2015  . Tobacco use 03/29/2015  . 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 Depressive disorder, not elsewhere classified 12/13/2012  . Obstructive sleep apnea 12/13/2012  . Esophageal reflux 12/13/2012  . Impaired fasting glucose 12/13/2012  . Tobacco use disorder 12/13/2012  . 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 Gout, unspecified 12/13/2012  . Long term current use of anticoagulant therapy 12/13/2012  . Atrial fibrillation ([place]) 01/30/2011  . Bradycardia 01/30/2011  . Sleep apnea 01/30/2011  . Atrial flutter ([place]) 01/25/2011  . Essential hypertension, benign 01/25/2011  . Pure hypercholesterolemia 01/25/2011   Past Medical History:  Diagnosis Date  . Acid reflux   . Arthritis   . ASD (atrial septal defect)   . Asthma with allergic rhinitis and status asthmaticus   . Atrial fib/flutter, transient   . Cancer ([place])    skin  . Chronic anticoagulation   . Depression   . ENT complaint    Dr. Erik Obey  . Erectile dysfunction   . Fatty liver   . FHx: allergies   . Gout    , Possible, Right Knee x2 ; intermittent milder episodes of pain in hte first MTP - uric acid 7.7- no treatment so far.  . Hemochromatosis   .  History of kidney stones   . Hypercholesteremia   . Hyperlipemia   . Hypertension   . Hypogonadism male   . Impaired fasting glucose   . 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  . Pneumonia    " walking PNA > 40 years ago"  . Seasonal allergies   . Sleep apnea    wears CPAP ( set at 4-11)  . Trigger finger, left    , fourth finger    Family History  Problem Relation Age of Onset  . Stroke Father   . Diabetes type II Father   . Heart disease Mother        with pacemaker  . Diabetes type II Maternal Grandfather     Past Surgical History:  Procedure  Laterality Date  . CARDIOVERSION N/A 05/29/2016   Procedure: CARDIOVERSION;  Surgeon: [name], MD;  Location: [facility];  Service: Cardiovascular;  Laterality: N/A;  . CATARACT EXTRACTION W/ INTRAOCULAR LENS  IMPLANT, BILATERAL    . COLONOSCOPY W/ POLYPECTOMY    . COLONOSCOPY WITH PROPOFOL N/A 09/26/2016   Procedure: COLONOSCOPY WITH PROPOFOL;  Surgeon: Wilford Corner, MD;  Location: [facility];  Service: Endoscopy;  Laterality: N/A;  . ESOPHAGOGASTRODUODENOSCOPY (EGD) WITH PROPOFOL N/A 09/26/2016   Procedure: ESOPHAGOGASTRODUODENOSCOPY (EGD) WITH PROPOFOL;  Surgeon: Wilford Corner, MD;  Location: [facility];  Service: Endoscopy;  Laterality: N/A;  . LIVER BIOPSY    . NASAL SINUS SURGERY  1997   Social History   Occupational History  . Occupation: retired  Tobacco Use  . Smoking status: Current Every Day Smoker    Packs/day: 0.25    Types: Cigarettes    Start date: 10/30/2013  . Smokeless tobacco: Former Systems developer    Types: Chew  . Tobacco comment: trying to quit  Substance and Sexual Activity  . Alcohol use: Yes    Alcohol/week: 0.0 oz    Comment: 2-3 glasses of red wine per night  . Drug use: No  . Sexual activity: Not on file

## 2017-09-03 ENCOUNTER — Ambulatory Visit: Payer: PPO | Admitting: Pharmacist

## 2017-09-03 DIAGNOSIS — I480 Paroxysmal atrial fibrillation: Secondary | ICD-10-CM

## 2017-09-03 LAB — POCT INR: INR: 2.6 (ref 2.0–3.0)

## 2017-09-03 NOTE — Patient Instructions (Signed)
Description   Continue taking 2.5 tablets daily except 2 tablets on Sundays and Fridays. Call if placed on any new medications [phone]. Recheck INR in 5 weeks - 1 week after prostate biopsy and same day as visit with Truitt Merle.

## 2017-09-04 ENCOUNTER — Telehealth (INDEPENDENT_AMBULATORY_CARE_PROVIDER_SITE_OTHER): Payer: Self-pay | Admitting: Orthopaedic Surgery

## 2017-09-04 NOTE — Telephone Encounter (Signed)
Emailed Ryan/Lucas.

## 2017-09-04 NOTE — Telephone Encounter (Signed)
Patient called asking about a knee brace, he said he was going to be measured and fitted for one but hasn't heard anything. [phone]

## 2017-09-05 ENCOUNTER — Encounter: Payer: Self-pay | Admitting: Registered"

## 2017-09-05 ENCOUNTER — Encounter: Payer: PPO | Attending: Internal Medicine | Admitting: Registered"

## 2017-09-05 DIAGNOSIS — R7303 Prediabetes: Secondary | ICD-10-CM

## 2017-09-05 NOTE — Progress Notes (Signed)
Patient was seen on 09/05/17 for the Core Session 1 of Diabetes Prevention Program course at [facility]. The following learning objectives were met by the patient during this class:   Learning Objectives:   Be able to explain the purpose and benefits of the National Diabetes Prevention Program.   Be able to describe the events that will take place at every session.   Know the weight loss and physical activity goals established by the [facility] Diabetes Prevention Program.   Know their own individual weight loss and physical activity goals.   Be able to explain the important effect of self-monitoring on behavior change.   Goals:  Record food and beverage intake in "Food and Activity Tracker" over the next week.  Bring completed "Food and Activity Tracker" for session 1 to session 2 next week. Circle the foods or beverages you think are highest in fat and calories in your food tracker. Read the labels on the food you buy, and consider using measuring cups and spoons to help you calculate the amount you eat. We will talk about measuring in more detail in the coming weeks.   Follow-Up Plan:  Attend Core Session 2 next week.   Bring completed "Food and Activity Tracker" next week to be reviewed by Lifestyle Coach.

## 2017-09-05 NOTE — Telephone Encounter (Signed)
He will be contacting patient

## 2017-09-08 DIAGNOSIS — I10 Essential (primary) hypertension: Secondary | ICD-10-CM | POA: Diagnosis not present

## 2017-09-08 DIAGNOSIS — J4520 Mild intermittent asthma, uncomplicated: Secondary | ICD-10-CM | POA: Diagnosis not present

## 2017-09-08 DIAGNOSIS — I4891 Unspecified atrial fibrillation: Secondary | ICD-10-CM | POA: Diagnosis not present

## 2017-09-08 DIAGNOSIS — F329 Major depressive disorder, single episode, unspecified: Secondary | ICD-10-CM | POA: Diagnosis not present

## 2017-09-12 ENCOUNTER — Encounter: Payer: Self-pay | Admitting: Registered"

## 2017-09-12 ENCOUNTER — Encounter: Payer: PPO | Attending: Internal Medicine | Admitting: Registered"

## 2017-09-12 DIAGNOSIS — R7303 Prediabetes: Secondary | ICD-10-CM

## 2017-09-12 NOTE — Progress Notes (Signed)
Patient was seen on 09/12/17 for the Core Session 2 of Diabetes Prevention Program course at [facility]. By the end of this session patients are able to complete the following objectives:   Learning Objectives:  Self-monitor their weight during the weeks following Session 2.   Describe the relationship between fat and calories.   Explain the reason for, and basic principles of, self-monitoring fat grams and calories.   Identify their personal fat gram goals.   Use the ?Fat and Calorie Counter? to calculate the calories and fat grams of a given selection of foods.   Keep a running total of the fat grams they eat each day.   Calculate fat, calories, and serving sizes from nutrition labels.   Goals:   Weigh yourself at the same time each day, or every few days, and record your weight in your Food and Activity Tracker.  Write down everything you eat and drink in your Food and Activity Tracker.  Measure portions as much as you can, and start reading labels.   Use the ?Fat and Calorie Counter? to figure out the amount of fat and calories in what you ate, and write the amount down in your Food and Activity Tracker.  Keep a running fat gram total throughout the day. Come as close to your fat gram goal as you can.   Follow-Up Plan:  Attend Core Session 3 next week.   Bring completed "Food and Activity Tracker" next week to be reviewed by Lifestyle Coach.

## 2017-09-15 ENCOUNTER — Telehealth (INDEPENDENT_AMBULATORY_CARE_PROVIDER_SITE_OTHER): Payer: Self-pay | Admitting: Orthopaedic Surgery

## 2017-09-15 NOTE — Telephone Encounter (Signed)
Patient called advised the insurance company ([employer]) is waiting for a call giving them the date patient's knee was injured. Patient said he is being going to be fitted for a knee brace. The number to contact patient is [phone]

## 2017-09-19 ENCOUNTER — Ambulatory Visit
Admission: RE | Admit: 2017-09-19 | Discharge: 2017-09-19 | Disposition: A | Discharge status: Home / Self Care | Payer: PPO | Source: Ambulatory Visit | Attending: Urology | Admitting: Urology

## 2017-09-19 DIAGNOSIS — C61 Malignant neoplasm of prostate: Secondary | ICD-10-CM | POA: Diagnosis not present

## 2017-09-19 MED ORDER — GADOBENATE DIMEGLUMINE 529 MG/ML IV SOLN
20.0000 mL | Freq: Once | INTRAVENOUS | Status: AC | PRN
  Administered 2017-09-19: 20 mL via INTRAVENOUS

## 2017-09-22 NOTE — Telephone Encounter (Signed)
Pending. Dr Erlinda Hong has to update note. Ryan aware of this. Please check cone email, let me know when this has been done so that I can send Thurmond Butts the new note. Thank you.

## 2017-09-22 NOTE — Telephone Encounter (Signed)
Per Phoebe Sharps  "They need the note to state the date of injury. Can he add that into the note and you send it back to me please? "

## 2017-09-25 DIAGNOSIS — S72435D Nondisplaced fracture of medial condyle of left femur, subsequent encounter for closed fracture with routine healing: Secondary | ICD-10-CM | POA: Diagnosis not present

## 2017-09-25 DIAGNOSIS — M25562 Pain in left knee: Secondary | ICD-10-CM | POA: Diagnosis not present

## 2017-09-26 ENCOUNTER — Encounter: Payer: PPO | Attending: Internal Medicine | Admitting: Registered"

## 2017-09-26 ENCOUNTER — Encounter: Payer: Self-pay | Admitting: Registered"

## 2017-09-26 DIAGNOSIS — R7303 Prediabetes: Secondary | ICD-10-CM | POA: Diagnosis not present

## 2017-09-26 NOTE — Progress Notes (Signed)
Patient was seen on 09/26/17 for the Core Session 3 of Diabetes Prevention Program course at [facility]. By the end of this session patients are able to complete the following objectives:   Learning Objectives:  Weigh and measure foods.  Estimate the fat and calorie content of common foods.  Describe three ways to eat less fat and fewer calories.  Create a plan to eat less fat for the following week.   Goals:   Track weight when weighing outside of class.   Track food and beverages eaten each day in Food and Activity Tracker and include fat grams and calories for each.   Try to stay within fat gram goal.   Complete plan for eating less high fat foods and answer related homework questions.    Follow-Up Plan:  Attend Core Session 4 next week.   Bring completed "Food and Activity Tracker" next week to be reviewed by Lifestyle Coach.

## 2017-09-30 ENCOUNTER — Other Ambulatory Visit: Payer: Self-pay | Admitting: Cardiology

## 2017-09-30 ENCOUNTER — Encounter (INDEPENDENT_AMBULATORY_CARE_PROVIDER_SITE_OTHER): Payer: Self-pay | Admitting: Orthopaedic Surgery

## 2017-09-30 ENCOUNTER — Ambulatory Visit (INDEPENDENT_AMBULATORY_CARE_PROVIDER_SITE_OTHER): Payer: PPO | Admitting: Orthopaedic Surgery

## 2017-09-30 DIAGNOSIS — M25562 Pain in left knee: Secondary | ICD-10-CM | POA: Diagnosis not present

## 2017-09-30 DIAGNOSIS — G8929 Other chronic pain: Secondary | ICD-10-CM | POA: Diagnosis not present

## 2017-09-30 NOTE — Progress Notes (Signed)
Office Visit Note   Patient: Jonathan Vargas           Date of Birth: 1945-07-25           MRN: [id number] Visit Date: 09/30/2017              Requested by: Leeroy Cha, MD [address] [place], [place] [address] PCP: Leeroy Cha, MD   Assessment & Plan: Visit Diagnoses:  1. Chronic pain of left knee     Plan: Impression is healing insufficiency fracture medial femoral condyle left knee.  We will give this more time to settle down as he just received his unloader brace yesterday.  He will gradually wean to weightbearing as tolerated without a crutch.  He will avoid any impact activities.  He will follow-up with [place] in 4 weeks time for recheck.  Follow-Up Instructions: Return in about 1 month (around 10/28/2017).   Orders:  No orders of the defined types were placed in this encounter.  No orders of the defined types were placed in this encounter.     Procedures: No procedures performed   Clinical Data: No additional findings.   Subjective: Chief Complaint  Patient presents with  . Left Knee - Pain    HPI patient is a pleasant 72 year old gentleman who presents to our clinic today for follow-up of his left knee.  History of pain since mid May 2019.  MRI from 08/30/2017 showed an insufficiency fracture to the medial femoral condyle as well as chondromalacia medial and patellofemoral compartments.  He has been ambulating with one crutch over the past several weeks.  Just yesterday, he was fitted for his unloader brace.  He does note moderate improvement of symptoms over the past few weeks, but did note an episode of increased pain a few days ago when he turned his leg a certain way while getting up in the middle the night to go to use the restroom.  Since then he has had intermittent pain medial aspect.   Review of Systems as detailed in HPI.  All others reviewed and are negative.   Objective: Vital Signs: There were no vitals taken for  this visit.  Physical Exam well-developed well-nourished gentleman in no acute distress.  Alert and oriented x3.  Ortho Exam stable knee exam  Specialty Comments:  No specialty comments available.  Imaging: No results found.   PMFS History: Patient Active Problem List   Diagnosis Date Noted  . Chronic pain of left knee 09/30/2017  . Personal history of colonic polyps 09/26/2016  . Family history of colon cancer 09/26/2016  . Obesity 03/29/2015  . Tobacco use 03/29/2015  . 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 Depressive disorder, not elsewhere classified 12/13/2012  . Obstructive sleep apnea 12/13/2012  . Esophageal reflux 12/13/2012  . Impaired fasting glucose 12/13/2012  . Tobacco use disorder 12/13/2012  . 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 Gout, unspecified 12/13/2012  . Long term current use of anticoagulant therapy 12/13/2012  . Atrial fibrillation ([place]) 01/30/2011  . Bradycardia 01/30/2011  . Sleep apnea 01/30/2011  . Atrial flutter ([place]) 01/25/2011  . Essential hypertension, benign 01/25/2011  . Pure hypercholesterolemia 01/25/2011   Past Medical History:  Diagnosis Date  . Acid reflux   . Arthritis   . ASD (atrial septal defect)   . Asthma with allergic rhinitis and status asthmaticus   . Atrial fib/flutter, transient   . Cancer ([facility])    skin  . Chronic  anticoagulation   . Depression   . ENT complaint    Dr. Erik Obey  . Erectile dysfunction   . Fatty liver   . FHx: allergies   . Gout    , Possible, Right Knee x2 ; intermittent milder episodes of pain in hte first MTP - uric acid 7.7- no treatment so far.  . Hemochromatosis   . History of kidney stones   . Hypercholesteremia   . Hyperlipemia   . Hypertension   . Hypogonadism male   . Impaired fasting glucose   . 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  . Pneumonia    " walking PNA > 40 years ago"  .  Seasonal allergies   . Sleep apnea    wears CPAP ( set at 4-11)  . Trigger finger, left    , fourth finger    Family History  Problem Relation Age of Onset  . Stroke Father   . Diabetes type II Father   . Heart disease Mother        with pacemaker  . Diabetes type II Maternal Grandfather     Past Surgical History:  Procedure Laterality Date  . CARDIOVERSION N/A 05/29/2016   Procedure: CARDIOVERSION;  Surgeon: [name], MD;  Location: [facility];  Service: Cardiovascular;  Laterality: N/A;  . CATARACT EXTRACTION W/ INTRAOCULAR LENS  IMPLANT, BILATERAL    . COLONOSCOPY W/ POLYPECTOMY    . COLONOSCOPY WITH PROPOFOL N/A 09/26/2016   Procedure: COLONOSCOPY WITH PROPOFOL;  Surgeon: Wilford Corner, MD;  Location: [facility];  Service: Endoscopy;  Laterality: N/A;  . ESOPHAGOGASTRODUODENOSCOPY (EGD) WITH PROPOFOL N/A 09/26/2016   Procedure: ESOPHAGOGASTRODUODENOSCOPY (EGD) WITH PROPOFOL;  Surgeon: Wilford Corner, MD;  Location: [facility];  Service: Endoscopy;  Laterality: N/A;  . LIVER BIOPSY    . NASAL SINUS SURGERY  1997   Social History   Occupational History  . Occupation: retired  Tobacco Use  . Smoking status: Current Every Day Smoker    Packs/day: 0.25    Types: Cigarettes    Start date: 10/30/2013  . Smokeless tobacco: Former Systems developer    Types: Chew  . Tobacco comment: trying to quit  Substance and Sexual Activity  . Alcohol use: Yes    Alcohol/week: 0.0 oz    Comment: 2-3 glasses of red wine per night  . Drug use: No  . Sexual activity: Not on file

## 2017-10-01 DIAGNOSIS — C61 Malignant neoplasm of prostate: Secondary | ICD-10-CM | POA: Diagnosis not present

## 2017-10-03 ENCOUNTER — Encounter: Payer: Self-pay | Admitting: Registered"

## 2017-10-03 ENCOUNTER — Encounter (HOSPITAL_BASED_OUTPATIENT_CLINIC_OR_DEPARTMENT_OTHER): Payer: PPO | Admitting: Registered"

## 2017-10-03 DIAGNOSIS — R7303 Prediabetes: Secondary | ICD-10-CM

## 2017-10-03 NOTE — Progress Notes (Signed)
Patient was seen on 10/03/17 for the Core Session 4 of Diabetes Prevention Program course at [facility]. By the end of this session patients are able to complete the following objectives:   Learning Objectives:  Describe the MyPlate food guide and its recommendations, including  how to reduce fat and calories in our diet.  Compare and contrast MyPlate guidelines with participants' eating  habits.  List ways to replace high-fat and high-calorie foods with low-fat and  low-calorie foods.  Explain the importance of eating plenty of whole grains, vegetables, and  fruits, while staying within fat gram goals.  Explain the importance of eating foods from all groups of MyPlate and  of eating a variety of foods from within each group.  Explain why a balanced diet is beneficial to health.  Explain why eating the same foods over and over is not the best strategy  for long-term success.   Goals:   Record weight taken outside of class.   Track foods and beverages eaten each day in the "Food and Activity Tracker," including calories and fat grams for each item.   Practice comparing what you eat with the recommendations of MyPlate using the "Rate Your Plate" handout.   Complete the "Rate Your Plate" handout form on at least 3 days.   Answer homework questions.   Follow-Up Plan:  Attend Core Session 5 next week.   Bring completed "Food and Activity Tracker" next week to be reviewed by Lifestyle Coach.

## 2017-10-07 ENCOUNTER — Encounter: Payer: Self-pay | Admitting: Nurse Practitioner

## 2017-10-07 ENCOUNTER — Ambulatory Visit (INDEPENDENT_AMBULATORY_CARE_PROVIDER_SITE_OTHER): Payer: PPO

## 2017-10-07 ENCOUNTER — Ambulatory Visit (INDEPENDENT_AMBULATORY_CARE_PROVIDER_SITE_OTHER): Payer: PPO | Admitting: Nurse Practitioner

## 2017-10-07 VITALS — BP 122/68 | HR 61 | Ht 74.0 in | Wt 226.0 lb

## 2017-10-07 DIAGNOSIS — Z5181 Encounter for therapeutic drug level monitoring: Secondary | ICD-10-CM

## 2017-10-07 DIAGNOSIS — I480 Paroxysmal atrial fibrillation: Secondary | ICD-10-CM

## 2017-10-07 DIAGNOSIS — Z7901 Long term (current) use of anticoagulants: Secondary | ICD-10-CM

## 2017-10-07 DIAGNOSIS — I10 Essential (primary) hypertension: Secondary | ICD-10-CM | POA: Diagnosis not present

## 2017-10-07 LAB — POCT INR: INR: 1.6 — AB (ref 2.0–3.0)

## 2017-10-07 NOTE — Patient Instructions (Addendum)
We will be checking the following labs today - NONE   Medication Instructions:    Continue with your current medicines.     Testing/Procedures To Be Arranged:  N/A  Follow-Up:   See me in 3 months    Other Special Instructions:   Keep up the good work!    If you need a refill on your cardiac medications before your next appointment, please call your pharmacy.   Call the [place] office at [phone] if you have any questions, problems or concerns.

## 2017-10-07 NOTE — Patient Instructions (Signed)
Description   Today take 3 tablets, then Continue taking 2.5 tablets daily except 2 tablets on Sundays and Fridays. Call if placed on any new medications [phone]. Recheck INR in 2 weeks.

## 2017-10-07 NOTE — Progress Notes (Signed)
CARDIOLOGY OFFICE NOTE  Date:  10/07/2017    Jonathan Vargas Date of Birth: 1945/05/06 Medical Record #[id number]  PCP:  Leeroy Cha, MD  Cardiologist:  Servando Snare & Sk[facility]/Allred  Chief Complaint  Patient presents with  . Atrial Fibrillation    2 month check - seen for Dr. Marlou Porch    History of Present Illness: Jonathan Vargas is a 72 y.o. male who presents today for a follow up visit. Seen for Dr. Marlou Porch & Allred.   He has a history of atrial fibrillation/flutter paroxysmal on antiarrhythmic and is on warfarin for his anticoagulation. Had stress test 09/01/12 that demonstrated a mild inferior wall defect, possible diaphragmatic attenuation, overall low risk study with normal ejection fraction. Other issues include HTN, prior carotid artery dissection in 1997, ongoing tobacco abuse, hemachromatosis. Previous hospitalization in November 2012 demonstrated paroxysmal atrial flutter which converted after 4 hours of diltiazem. He had a postconversion pause of 8.2 seconds, felt flushed. Endorses chronic palpitations.   I saw him as a work in back in March of 2018 - out of rhythm. Somewhat symptomatic. Got him cardioverted and back to see [name] for discussion of other options - but opted for his continued regimen. ARB was restarted for elevated BP. Patient elected to continue CCB therapy despite bradycardia. He was continued on his anticoagulation with coumadin.   I  saw him back in September of 2018 - BP was up - he had increased his ARB on his own. He attributed some of this to drinking wine and was trying to cut back in order to lose weight. His cuff runs about 10 points higher.Last seen by me back in October - trying to give up wine - switched to liquor. May have had brief episode of AF. His cuff runs about 10 points higher. Last visit with me was in January of 2019 - BP ok.   Last seen by Dr. Marlou Porch in May - felt presyncopal. CCB was changed to evening time without  change and this was subsequently stopped. There was concern for post conversion pauses which has been documented in the past.   Comes in today. Here alone.He has a stress fracture in his leg - is in a brace for the past week. Smoking 6 cigs per day - this is chronic. He has been getting some diabetic education - he has lost a few pounds despite not being able to go to the gym. He has been borderline diabetic and has a good friend who just got diagnosed with diabetes - they are currently concentrating on limiting fats - he limits to 50 gm/day. He still has his 2 glasses of wine each night. He feels pretty good. No dizzy or lightheaded. No syncope. No palpitations. No chest pain and not short of breath.   Past Medical History:  Diagnosis Date  . Acid reflux   . Arthritis   . ASD (atrial septal defect)   . Asthma with allergic rhinitis and status asthmaticus   . Atrial fib/flutter, transient   . Cancer ([place])    skin  . Chronic anticoagulation   . Depression   . ENT complaint    Dr. Erik Obey  . Erectile dysfunction   . Fatty liver   . FHx: allergies   . Gout    , Possible, Right Knee x2 ; intermittent milder episodes of pain in hte first MTP - uric acid 7.7- no treatment so far.  . Hemochromatosis   . History of kidney stones   .  Hypercholesteremia   . Hyperlipemia   . Hypertension   . Hypogonadism male   . Impaired fasting glucose   . 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  . Pneumonia    " walking PNA > 40 years ago"  . Seasonal allergies   . Sleep apnea    wears CPAP ( set at 4-11)  . Trigger finger, left    , fourth finger    Past Surgical History:  Procedure Laterality Date  . CARDIOVERSION N/A 05/29/2016   Procedure: CARDIOVERSION;  Surgeon: [name], MD;  Location: [facility];  Service: Cardiovascular;  Laterality: N/A;  . CATARACT EXTRACTION W/ INTRAOCULAR LENS  IMPLANT, BILATERAL    .  COLONOSCOPY W/ POLYPECTOMY    . COLONOSCOPY WITH PROPOFOL N/A 09/26/2016   Procedure: COLONOSCOPY WITH PROPOFOL;  Surgeon: Wilford Corner, MD;  Location: [facility];  Service: Endoscopy;  Laterality: N/A;  . ESOPHAGOGASTRODUODENOSCOPY (EGD) WITH PROPOFOL N/A 09/26/2016   Procedure: ESOPHAGOGASTRODUODENOSCOPY (EGD) WITH PROPOFOL;  Surgeon: Wilford Corner, MD;  Location: [facility];  Service: Endoscopy;  Laterality: N/A;  . LIVER BIOPSY    . NASAL SINUS SURGERY  1997     Medications: Current Meds  Medication Sig  . albuterol (PROVENTIL HFA;VENTOLIN HFA) 108 (90 Base) MCG/ACT inhaler Inhale 2 puffs into the lungs every 6 (six) hours as needed for wheezing or shortness of breath.  . allopurinol (ZYLOPRIM) 300 MG tablet Take 300 mg by mouth daily.  Marland Kitchen CALCIUM PO Take 200 mg by mouth 2 (two) times daily.  Marland Kitchen COLCRYS 0.6 MG tablet Take 0.6 mg by mouth 2 (two) times daily as needed (for gout).   . diazepam (VALIUM) 10 MG tablet TAKE WITH TO MRI APT  . diclofenac sodium (VOLTAREN) 1 % GEL Apply 1 application topically 4 (four) times daily as needed (for pain).   . fexofenadine (ALLEGRA) 60 MG tablet Take 60 mg by mouth daily as needed. For allergies  . flecainide (TAMBOCOR) 50 MG tablet TAKE 1 TABLET BY MOUTH TWICE A DAY  . fluticasone (FLONASE) 50 MCG/ACT nasal spray Place 1 spray into the nose daily.   . irbesartan (AVAPRO) 150 MG tablet Take 1 tablet (150 mg total) by mouth daily.  Marland Kitchen MAGNESIUM PO Take 1 capsule by mouth daily.  . Misc Natural Products (LUTEIN 20) CAPS Take 40 mg by mouth 2 (two) times daily.   . NON FORMULARY CPAP  . TURMERIC PO Take 1 capsule by mouth daily.  . vitamin B-12 (CYANOCOBALAMIN) 1000 MCG tablet Take 1,000 mcg by mouth daily.  Marland Kitchen warfarin (COUMADIN) 5 MG tablet Take 2 or 2.5 tablets daily as directed by Coumadin clinic     Allergies: Allergies  Allergen Reactions  . Clindamycin/Lincomycin Diarrhea and Other (See Comments)    Stomach pain   .  Amoxicillin Rash    Social History: The patient  reports that he has been smoking cigarettes.  He started smoking about 3 years ago. He has been smoking about 0.25 packs per day. He has quit using smokeless tobacco. His smokeless tobacco use included chew. He reports that he drinks alcohol. He reports that he does not use drugs.   Family History: The patient's family history includes Diabetes type II in his father and maternal grandfather; Heart disease in his mother; Stroke in his father.   Review of Systems: Please see the history of present illness.   Otherwise, the review of systems is positive for none.   All  other systems are reviewed and negative.   Physical Exam: VS:  BP 122/68 (BP Location: Left Arm, Patient Position: Sitting, Cuff Size: Normal)   Pulse 61   Ht 6\' 2"  (1.88 m)   Wt 226 lb (102.5 kg)   BMI 29.02 kg/m  .  BMI Body mass index is 29.02 kg/m.  Wt Readings from Last 3 Encounters:  10/07/17 226 lb (102.5 kg)  10/03/17 227 lb 6.4 oz (103.1 kg)  09/26/17 228 lb 8 oz (103.6 kg)    General: Pleasant. Well developed, well nourished and in no acute distress. His weight is down from 234.   HEENT: Normal.  Neck: Supple, no JVD, carotid bruits, or masses noted.  Cardiac: Regular rate and rhythm. No murmurs, rubs, or gallops. No edema.  Respiratory:  Lungs are clear to auscultation bilaterally with normal work of breathing.  GI: Soft and nontender.  MS: No deformity or atrophy. Gait and ROM intact.  Skin: Warm and dry. Color is normal.  Neuro:  Strength and sensation are intact and no gross focal deficits noted.  Psych: Alert, appropriate and with normal affect.   LABORATORY DATA:  EKG:  EKG is ordered today. This demonstrates NSR.  Lab Results  Component Value Date   WBC 5.8 11/26/2016   HGB 13.1 04/04/2017   HCT 44.4 11/26/2016   PLT 207 11/26/2016   GLUCOSE 124 (H) 11/26/2016   NA 139 11/26/2016   K 4.9 11/26/2016   CL 104 11/26/2016   CREATININE 1.12  11/26/2016   BUN 16 11/26/2016   CO2 22 11/26/2016   TSH 3.400 05/27/2016   INR 2.6 09/03/2017   Lab Results  Component Value Date   INR 2.6 09/03/2017   INR 1.9 (A) 08/12/2017   INR 2.4 07/15/2017     BNP (last 3 results) No results for input(s): BNP in the last 8760 hours.  ProBNP (last 3 results) No results for input(s): PROBNP in the last 8760 hours.   Other Studies Reviewed Today:  Echo Study Conclusions 05/2016  - Left ventricle: The cavity size was normal. Wall thickness was increased in a pattern of mild LVH. Systolic function was normal. The estimated ejection fraction was in the range of 55% to 60%. Wall motion was normal; there were no regional wall motion abnormalities. Left ventricular diastolic function parameters were normal. - Left atrium: The atrium was moderately dilated. - Atrial septum: No defect or patent foramen ovale was identified.  Procedure: Electrical Cardioversion Indications:Atrial Flutter  Procedure Details Consent: Risks of procedure as well as the alternatives and risks of each were explained to the (patient/caregiver). Consent for procedure obtained. Time YIR:SWNIOEVO patient identification, verified procedure, site/side was marked, verified correct patient position, special equipment/implants available, medications/allergies/relevent history reviewed, required imaging and test results available. Performed  Patient placed on cardiac monitor, pulse oximetry, supplemental oxygen as necessary.  Sedation given: Propofol 70 mg Pacer pads placed anterior and posterior chest. Cardioverted 1time(s).  Cardioverted at 120J. Evaluation Findings: Post procedure EKG shows: NSR Complications: None Patient didtolerate procedure well.  [name], MD 05/29/2016, 1:53 PM    Assessment/Plan:  1. PAF/flutter -  last cardioversion in 2018. Has seen EP in the past - opted to continue with current regimen for his PAF.   Remains in NSR by exam. He has had to have his CCB stopped due to presyncope - this is now resolved. HR is 61 today. No indication for further testing/PPM at this time.   2. HTN - BPlooks good here  today - no changes made.   3. Chronic anticoagulation with coumadin - checking INR today - no problems noted. His other labs are checked by PCP.   4. Obesity - he has enrolled himself in the diabetic classes - he has been successful and I encouraged him to continue with his efforts.   5. 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 Discussed at length today - I don't get the impression that he is ready to stop/cut back.He may end up cutting back on alcohol as they move to "decreasing sugars" in his diabetic classes.   Current medicines are reviewed with the patient today.  The patient does not have concerns regarding medicines other than what has been noted above.  The following changes have been made:  See above.  Labs/ tests ordered today include:    Orders Placed This Encounter  Procedures  . EKG 12-Lead     Disposition:   FU with me in 3 months.   Patient is agreeable to this plan and will call if any problems develop in the interim.   SignedTruitt Merle, NP  10/07/2017 9:34 AM  [facility] [address] [place], [place]  [address] Phone: [phone] Fax: [phone]

## 2017-10-09 DIAGNOSIS — M25532 Pain in left wrist: Secondary | ICD-10-CM | POA: Diagnosis not present

## 2017-10-09 DIAGNOSIS — M720 Palmar fascial fibromatosis [Dupuytren]: Secondary | ICD-10-CM | POA: Diagnosis not present

## 2017-10-10 ENCOUNTER — Encounter (HOSPITAL_BASED_OUTPATIENT_CLINIC_OR_DEPARTMENT_OTHER): Payer: PPO | Admitting: Registered"

## 2017-10-10 ENCOUNTER — Encounter: Payer: Self-pay | Admitting: Registered"

## 2017-10-10 DIAGNOSIS — R7303 Prediabetes: Secondary | ICD-10-CM | POA: Diagnosis not present

## 2017-10-10 NOTE — Progress Notes (Signed)
Patient was seen on 10/10/17 for the Core Session 5 of Diabetes Prevention Program course at [facility]. By the end of this session patients are able to complete the following objectives:   Learning Objectives:  Establish a physical activity goal.  Explain the importance of the physical activity goal.  Describe their current level of physical activity.  Name ways that they are already physically active.  Develop personal plans for physical activity for the next week.   Goals:   Record weight taken outside of class.   Track foods and beverages eaten each day in the "Food and Activity Tracker," including calories and fat grams for each item.   Make an Activity Plan including date, specific type of activity, and length of time you plan to be active that includes at last 60 minutes of activity for the week.   Track activity type, minutes you were active, and distance you reached each day in the "Food and Activity Tracker."   Follow-Up Plan:  Attend Core Session 6 next week.   Bring completed "Food and Activity Tracker" next week to be reviewed by Lifestyle Coach.

## 2017-10-17 ENCOUNTER — Encounter: Payer: PPO | Attending: Internal Medicine | Admitting: Registered"

## 2017-10-17 ENCOUNTER — Encounter: Payer: Self-pay | Admitting: Registered"

## 2017-10-17 DIAGNOSIS — R7303 Prediabetes: Secondary | ICD-10-CM

## 2017-10-17 NOTE — Progress Notes (Signed)
Patient was seen on 10/17/17 for the Core Session 6 of Diabetes Prevention Program course at [facility]. By the end of this session patients are able to complete the following objectives:   Learning Objectives:  Graph their daily physical activity.   Describe two ways of finding the time to be active.   Define "lifestyle activity."   Describe how to prevent injury.   Develop an activity plan for the coming week.   Goals:   Record weight taken outside of class.   Track foods and beverages eaten each day in the "Food and Activity Tracker," including calories and fat grams for each item.    Track activity type, minutes you were active, and distance you reached each day in the "Food and Activity Tracker."   Set aside one 20 to 30-minute block of time every day or find two or more periods of 10 to15 minutes each for physical activity.   Warm up, cool down, and stretch.  Make a Physical Activities Plan for the Week.   Follow-Up Plan:  Attend Core Session 7 next week.   Bring completed "Food and Activity Tracker" next week to be reviewed by Lifestyle Coach.

## 2017-10-21 ENCOUNTER — Encounter: Payer: Self-pay | Admitting: Radiation Oncology

## 2017-10-21 NOTE — Progress Notes (Signed)
GU Location of Tumor / Histology: prostatic adenocarcinoma  If Prostate Cancer, Gleason Score is (3 + 4) and PSA is (6.93). Prostate volume: 47.1   Jonathan Vargas was diagnosed with prostate cancer 01/17/2017. He opted for active surveillance. He returned on 09/19/2017 for prostate MRI that revealed a 13X14 nodule in the right anterior apex. No pelvic adenopathy or bony metastatic disease. Mild extracapsular extension to the right anterior suspected. Prostate fusion biopsy performed 10/01/2017    Past/Anticipated interventions by urology, if any: prostate biopsy, active surveillance, prostate MRI, prostate fusion biopsy, referral to radiation oncology  Past/Anticipated interventions by medical oncology, if any: no  Weight changes, if any: no  Bowel/Bladder complaints, if any: IPSS 20. Reports occasional mild dysuria. Reports an episode of hematuria 10 days ago but none since. Denies urinary leakage or incontinence.   Nausea/Vomiting, if any: no  Pain issues, if any:  Left hip pain related to stress fracture in left knee  SAFETY ISSUES:  Prior radiation? no  Pacemaker/ICD? no  Possible current pregnancy? no  Is the patient on methotrexate?  no  Current Complaints / other details:  72 year old male. Divorced. Patient smokes. Retired. Hx of afib. No sexual activity in 3 years. Resides in [place].

## 2017-10-22 ENCOUNTER — Ambulatory Visit
Admission: RE | Admit: 2017-10-22 | Discharge: 2017-10-22 | Disposition: A | Discharge status: Home / Self Care | Payer: PPO | Source: Ambulatory Visit | Attending: Radiation Oncology | Admitting: Radiation Oncology

## 2017-10-22 ENCOUNTER — Other Ambulatory Visit: Payer: Self-pay

## 2017-10-22 ENCOUNTER — Encounter: Payer: Self-pay | Admitting: Radiation Oncology

## 2017-10-22 ENCOUNTER — Ambulatory Visit: Payer: PPO

## 2017-10-22 VITALS — BP 152/78 | HR 57 | Temp 98.4°F | Resp 18 | Ht 74.0 in | Wt 225.8 lb

## 2017-10-22 DIAGNOSIS — Z800 Family history of malignant neoplasm of digestive organs: Secondary | ICD-10-CM | POA: Diagnosis not present

## 2017-10-22 DIAGNOSIS — K219 Gastro-esophageal reflux disease without esophagitis: Secondary | ICD-10-CM | POA: Diagnosis not present

## 2017-10-22 DIAGNOSIS — E785 Hyperlipidemia, unspecified: Secondary | ICD-10-CM | POA: Insufficient documentation

## 2017-10-22 DIAGNOSIS — I10 Essential (primary) hypertension: Secondary | ICD-10-CM | POA: Insufficient documentation

## 2017-10-22 DIAGNOSIS — I4892 Unspecified atrial flutter: Secondary | ICD-10-CM | POA: Diagnosis not present

## 2017-10-22 DIAGNOSIS — I739 Peripheral vascular disease, unspecified: Secondary | ICD-10-CM | POA: Insufficient documentation

## 2017-10-22 DIAGNOSIS — F329 Major depressive disorder, single episode, unspecified: Secondary | ICD-10-CM | POA: Diagnosis not present

## 2017-10-22 DIAGNOSIS — I4891 Unspecified atrial fibrillation: Secondary | ICD-10-CM | POA: Diagnosis not present

## 2017-10-22 DIAGNOSIS — C61 Malignant neoplasm of prostate: Secondary | ICD-10-CM | POA: Insufficient documentation

## 2017-10-22 DIAGNOSIS — I480 Paroxysmal atrial fibrillation: Secondary | ICD-10-CM

## 2017-10-22 DIAGNOSIS — Q211 Atrial septal defect: Secondary | ICD-10-CM | POA: Diagnosis not present

## 2017-10-22 DIAGNOSIS — F17210 Nicotine dependence, cigarettes, uncomplicated: Secondary | ICD-10-CM | POA: Insufficient documentation

## 2017-10-22 DIAGNOSIS — Z7901 Long term (current) use of anticoagulants: Secondary | ICD-10-CM | POA: Diagnosis not present

## 2017-10-22 DIAGNOSIS — E7800 Pure hypercholesterolemia, unspecified: Secondary | ICD-10-CM | POA: Diagnosis not present

## 2017-10-22 DIAGNOSIS — K760 Fatty (change of) liver, not elsewhere classified: Secondary | ICD-10-CM | POA: Insufficient documentation

## 2017-10-22 DIAGNOSIS — Z79899 Other long term (current) drug therapy: Secondary | ICD-10-CM | POA: Diagnosis not present

## 2017-10-22 DIAGNOSIS — R9389 Abnormal findings on diagnostic imaging of other specified body structures: Secondary | ICD-10-CM | POA: Diagnosis not present

## 2017-10-22 DIAGNOSIS — R972 Elevated prostate specific antigen [PSA]: Secondary | ICD-10-CM | POA: Diagnosis not present

## 2017-10-22 HISTORY — DX: Malignant neoplasm of prostate: C61

## 2017-10-22 LAB — POCT INR: INR: 2.3 (ref 2.0–3.0)

## 2017-10-22 NOTE — Progress Notes (Signed)
See progress note under physician encounter. 

## 2017-10-22 NOTE — Progress Notes (Signed)
Radiation Oncology         [id number]) [phone] ________________________________  Initial outpatient Consultation  Name: Jonathan Vargas MRN: [id number]  Date: 10/22/2017  DOB: [birth year]  NL:[id number], Ronie Spies, MD  Leeroy Cha,*   REFERRING PHYSICIAN: Leeroy Cha,*  DIAGNOSIS: 72 y.o. gentleman with Stage T1c adenocarcinoma of the prostate with Gleason Score of 3+4, and PSA of 5.94.    ICD-10-CM   1. Malignant neoplasm of prostate ([place]) C61     HISTORY OF PRESENT ILLNESS: Jonathan Vargas is a 72 y.o. male with a diagnosis of prostate cancer. He was initially diagnosed with Gleason 3+4 adenocarcinoma of the prostate in November 2018 with a PSA of 6.93 at that time.  After discussion of treatment options with Dr. Diona Fanti, the patient elected to proceed in active surveillance.  He has continued in follow up with urology since that time and digital rectal exam has remained unconcerning and without discrete nodularity.  He had an Oncotype DX score performed which was a 30, indicating unfavorable intermediate risk disease.  A repeat PSA on 05/19/2017 was 5.94.  He underwent a surveillance MRI of the prostate on 09/19/2017 which revealed a 13 x 14 mm nodule in the right anterior apex, PI-RADS category 5.  There was no evidence of seminal vesicle involvement, perineural invasion, lymphadenopathy or bony metastatic disease but there was suspected mild extracapsular extension to the right anterior.  The patient proceeded to MRI fusion transrectal ultrasound with 15 biopsies of the prostate on 10/01/17.  The prostate volume measured 35 cc.  Out of 15 core biopsies,4 were positive.  The maximum Gleason score was 3+4, and this was seen in 2 of the 3 region of interest samples as well as the right apex on traditional sampling.  Additionally, there was Gleason 3+3 disease at the right apex lateral.  The patient reviewed the recent biopsy results with his urologist and he has kindly been  referred today for discussion of potential radiation treatment options.   PREVIOUS RADIATION THERAPY: No  PAST MEDICAL HISTORY:  Past Medical History:  Diagnosis Date  . Acid reflux   . Arthritis   . ASD (atrial septal defect)   . Asthma with allergic rhinitis and status asthmaticus   . Atrial fib/flutter, transient   . Cancer ([place])    skin  . Chronic anticoagulation   . Depression   . ENT complaint    Dr. Erik Obey  . Erectile dysfunction   . Fatty liver   . FHx: allergies   . Gout    , Possible, Right Knee x2 ; intermittent milder episodes of pain in hte first MTP - uric acid 7.7- no treatment so far.  . Hemochromatosis   . History of kidney stones   . Hypercholesteremia   . Hyperlipemia   . Hypertension   . Hypogonadism male   . Impaired fasting glucose   . 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  . Pneumonia    " walking PNA > 40 years ago"  . Prostate cancer ([place])   . Seasonal allergies   . Sleep apnea    wears CPAP ( set at 4-11)  . Trigger finger, left    , fourth finger      PAST SURGICAL HISTORY: Past Surgical History:  Procedure Laterality Date  . CARDIOVERSION N/A 05/29/2016   Procedure: CARDIOVERSION;  Surgeon: [name], MD;  Location: [place];  Service: Cardiovascular;  Laterality: N/A;  . CATARACT EXTRACTION W/  INTRAOCULAR LENS  IMPLANT, BILATERAL    . COLONOSCOPY W/ POLYPECTOMY    . COLONOSCOPY WITH PROPOFOL N/A 09/26/2016   Procedure: COLONOSCOPY WITH PROPOFOL;  Surgeon: Wilford Corner, MD;  Location: [place];  Service: Endoscopy;  Laterality: N/A;  . ESOPHAGOGASTRODUODENOSCOPY (EGD) WITH PROPOFOL N/A 09/26/2016   Procedure: ESOPHAGOGASTRODUODENOSCOPY (EGD) WITH PROPOFOL;  Surgeon: Wilford Corner, MD;  Location: [place];  Service: Endoscopy;  Laterality: N/A;  . LIVER BIOPSY    . NASAL SINUS SURGERY  1997    FAMILY HISTORY:  Family History  Problem  Relation Age of Onset  . Stroke Father   . Diabetes type II Father   . Colon cancer Father   . Heart disease Mother        with pacemaker  . Breast cancer Sister   . Diabetes type II Maternal Grandfather   . Leukemia Paternal Uncle   . Prostate cancer Other   . Pancreatic cancer Neg Hx     SOCIAL HISTORY:  Social History   Socioeconomic History  . Marital status: Divorced    Spouse name: Not on file  . Number of children: Not on file  . Years of education: Bachelors  . Highest education level: Not on file  Occupational History  . Occupation: retired  Scientific laboratory technician  . Financial resource strain: Not on file  . Food insecurity:    Worry: Not on file    Inability: Not on file  . Transportation needs:    Medical: Not on file    Non-medical: Not on file  Tobacco Use  . Smoking status: Current Every Day Smoker    Packs/day: 0.25    Years: 50.00    Pack years: 12.50    Types: Cigarettes    Start date: 10/30/2013  . Smokeless tobacco: Former Systems developer    Types: Chew  . Tobacco comment: trying to quit  Substance and Sexual Activity  . Alcohol use: Yes    Alcohol/week: 0.0 oz    Comment: 2-3 glasses of red wine per night  . Drug use: No  . Sexual activity: Not Currently  Lifestyle  . Physical activity:    Days per week: Not on file    Minutes per session: Not on file  . Stress: Not on file  Relationships  . Social connections:    Talks on phone: Not on file    Gets together: Not on file    Attends religious service: Not on file    Active member of club or organization: Not on file    Attends meetings of clubs or organizations: Not on file    Relationship status: Not on file  . Intimate partner violence:    Fear of current or ex partner: Not on file    Emotionally abused: Not on file    Physically abused: Not on file    Forced sexual activity: Not on file  Other Topics Concern  . Not on file  Social History Narrative   Pt lives in [place] alone.  Retired from  [employer]. Right handed. Caffeine 3 cups     ALLERGIES: Clindamycin/lincomycin and Amoxicillin  MEDICATIONS:  Current Outpatient Medications  Medication Sig Dispense Refill  . albuterol (PROVENTIL HFA;VENTOLIN HFA) 108 (90 Base) MCG/ACT inhaler Inhale 2 puffs into the lungs every 6 (six) hours as needed for wheezing or shortness of breath.    . allopurinol (ZYLOPRIM) 300 MG tablet Take 300 mg by mouth daily.  1  . CALCIUM PO Take 200 mg  by mouth 2 (two) times daily.    Marland Kitchen COLCRYS 0.6 MG tablet Take 0.6 mg by mouth 2 (two) times daily as needed (for gout).     Marland Kitchen diclofenac sodium (VOLTAREN) 1 % GEL Apply 1 application topically 4 (four) times daily as needed (for pain).   3  . fexofenadine (ALLEGRA) 60 MG tablet Take 60 mg by mouth daily as needed. For allergies    . flecainide (TAMBOCOR) 50 MG tablet TAKE 1 TABLET BY MOUTH TWICE A DAY 180 tablet 3  . fluticasone (FLONASE) 50 MCG/ACT nasal spray Place 1 spray into the nose daily.     . irbesartan (AVAPRO) 150 MG tablet Take 1 tablet (150 mg total) by mouth daily. 90 tablet 2  . MAGNESIUM PO Take 1 capsule by mouth daily.    . Misc Natural Products (LUTEIN 20) CAPS Take 40 mg by mouth 2 (two) times daily.     . NON FORMULARY CPAP    . TURMERIC PO Take 1 capsule by mouth daily.    . vitamin B-12 (CYANOCOBALAMIN) 1000 MCG tablet Take 1,000 mcg by mouth daily.    Marland Kitchen warfarin (COUMADIN) 5 MG tablet Take 2 or 2.5 tablets daily as directed by Coumadin clinic 210 tablet 1  . diazepam (VALIUM) 10 MG tablet TAKE WITH TO MRI APT  0   No current facility-administered medications for this encounter.     REVIEW OF SYSTEMS:  On review of systems, the patient reports that he is doing well overall. He denies any chest pain, shortness of breath, cough, fevers, chills, night sweats, unintended weight changes. He notes occasional asthma, and uses an inhaler as needed. He reports occasional mild dysuria, denies urinary leakage or incontinence.  He notes an isolated episode of hematuria 10 days ago following prostate biopsy but has not seen any further. He denies abdominal pain, nausea or vomiting. He reports left leg pain related to a healing stress fracture in the left knee. He denies any new musculoskeletal or joint aches or pains. His IPSS was 20, indicating severe urinary symptoms with intermittency, weak stream and difficulty emptying his bladder. He has not tried any medication for BPH with BOO.  He has not been sexually active for the last 3 years. A complete review of systems is obtained and is otherwise negative.    PHYSICAL EXAM:  Wt Readings from Last 3 Encounters:  10/22/17 225 lb (102.1 kg)  10/22/17 225 lb 12.8 oz (102.4 kg)  10/17/17 225 lb 1.6 oz (102.1 kg)   Temp Readings from Last 3 Encounters:  10/22/17 98.4 F (36.9 C) (Oral)  10/22/17 98.4 F (36.9 C) (Oral)  10/11/16 98.3 F (36.8 C) (Oral)   BP Readings from Last 3 Encounters:  10/22/17 (!) 152/78  10/22/17 (!) 152/78  10/07/17 122/68   Pulse Readings from Last 3 Encounters:  10/22/17 (!) 57  10/22/17 (!) 57  10/07/17 61   Pain Assessment Pain Score: 0-No pain/10  In general this is a well appearing Caucasian man in no acute distress. He is alert and oriented x4 and appropriate throughout the examination. HEENT reveals that the patient is normocephalic, atraumatic. EOMs are intact. PERRLA. Skin is intact without any evidence of gross lesions. Cardiovascular exam reveals a regular rate and rhythm, no clicks rubs or murmurs are auscultated. Chest is clear to auscultation bilaterally. Lymphatic assessment is performed and does not reveal any adenopathy in the cervical, supraclavicular, axillary, or inguinal chains. Abdomen has active bowel sounds in all quadrants and  is intact. The abdomen is soft, non tender, non distended. Lower extremities are negative for pretibial pitting edema, deep calf tenderness, cyanosis or clubbing.  KPS = 100  100 - Normal;  no complaints; no evidence of disease. 90   - Able to carry on normal activity; minor signs or symptoms of disease. 80   - Normal activity with effort; some signs or symptoms of disease. 62   - Cares for self; unable to carry on normal activity or to do active work. 60   - Requires occasional assistance, but is able to care for most of his personal needs. 50   - Requires considerable assistance and frequent medical care. 72   - Disabled; requires special care and assistance. 23   - Severely disabled; hospital admission is indicated although death not imminent. 53   - Very sick; hospital admission necessary; active supportive treatment necessary. 10   - Moribund; fatal processes progressing rapidly. 0     - Dead  Karnofsky DA, Abelmann [place], Craver LS and Burchenal [facility] [phone]) The use of the nitrogen mustards in the palliative treatment of carcinoma: with particular reference to bronchogenic carcinoma Cancer 1 634-56  LABORATORY DATA:  Lab Results  Component Value Date   WBC 5.8 11/26/2016   HGB 13.1 04/04/2017   HCT 44.4 11/26/2016   MCV 101 (H) 11/26/2016   PLT 207 11/26/2016   Lab Results  Component Value Date   NA 139 11/26/2016   K 4.9 11/26/2016   CL 104 11/26/2016   CO2 22 11/26/2016   No results found for: ALT, AST, GGT, ALKPHOS, BILITOT   RADIOGRAPHY: No results found.    IMPRESSION/PLAN: 1. 72 y.o. gentleman with Stage T1c adenocarcinoma of the prostate with Gleason Score of 3+4, and PSA of 5.94. We discussed the patient's workup and outlined the nature of prostate cancer in this setting. The patient's T stage, Gleason's score, and PSA put him into the favorable intermediate risk group. Accordingly, he is eligible for a variety of potential treatment options including brachytherapy, 8 weeks of external radiation or 5 weeks of external radiation followed by a brachytherapy boost. We discussed the available radiation techniques, and focused on the details and logistics and  delivery.  We discussed and outlined the risks, benefits, short and long-term effects associated with radiotherapy and compared and contrasted these with prostatectomy. We also discussed the role of SpaceOAR in reducing the rectal toxicity associated with radiotherapy.   At the conclusion of our conversation, the patient is interested in moving forward with brachytherapy and use of SpaceOAR to reduce rectal toxicity from radiotherapy.  He understands that he is at an increased risk for bladder outlet obstructive symptoms and possible postprocedure acute urinary retention based on his preprocedure IPSS score.  However, he does not have an excessively enlarged prostate and would likely benefit from use of an alpha blocker such as Flomax for a few weeks/months following his seed implant.  We will share our discussion with Dr. Diona Fanti and move forward with scheduling his [facility] planning appointment in the near future.  The patient met briefly with Romie Jumper in our office who will be working closely with him to coordinate OR scheduling and pre and post procedure appointments.  We will contact the pharmaceutical rep to ensure that Benson is available at the time of procedure.  He will have a prostate MRI following his post-seed CT SIM to confirm appropriate distribution of the [place].    Nicholos Johns, PA-C  Tyler Pita, MD  [facility]  Radiation Oncology Direct Dial: [phone]  Fax: [phone] [email or web address]  Skype  LinkedIn  This document serves as a record of services personally performed by [employer], PA-C. It was created on their behalf by Wilburn Mylar, a trained medical scribe. The creation of this record is based on the scribe's personal observations and the provider's statements to them. This document has been checked and approved by the attending provider.

## 2017-10-22 NOTE — Patient Instructions (Signed)
Description   Continue taking 2.5 tablets daily except 2 tablets on Sundays and Fridays. Call if placed on any new medications [phone]. Recheck INR in 4 weeks.

## 2017-10-23 ENCOUNTER — Other Ambulatory Visit (HOSPITAL_COMMUNITY): Payer: Self-pay

## 2017-10-24 ENCOUNTER — Other Ambulatory Visit (HOSPITAL_COMMUNITY): Payer: Self-pay | Admitting: *Deleted

## 2017-10-24 ENCOUNTER — Encounter (HOSPITAL_BASED_OUTPATIENT_CLINIC_OR_DEPARTMENT_OTHER): Payer: PPO | Admitting: Registered"

## 2017-10-24 ENCOUNTER — Encounter: Payer: Self-pay | Admitting: Registered"

## 2017-10-24 ENCOUNTER — Telehealth: Payer: Self-pay | Admitting: *Deleted

## 2017-10-24 ENCOUNTER — Encounter (HOSPITAL_COMMUNITY)
Admission: RE | Admit: 2017-10-24 | Discharge: 2017-10-24 | Disposition: A | Discharge status: Home / Self Care | Payer: PPO | Source: Ambulatory Visit | Attending: Internal Medicine | Admitting: Internal Medicine

## 2017-10-24 DIAGNOSIS — R7303 Prediabetes: Secondary | ICD-10-CM

## 2017-10-24 LAB — POCT HEMOGLOBIN-HEMACUE: HEMOGLOBIN: 14.7 g/dL (ref 13.0–17.0)

## 2017-10-24 NOTE — Progress Notes (Signed)
Patient was seen on 10/24/17 for the Core Session 7 of Diabetes Prevention Program course at [facility]. By the end of this session patients are able to complete the following objectives:   Learning Objectives:  Define calorie balance.  Explain how healthy eating and being active are related in terms of calorie balance.   Describe the relationship between calorie balance and weight loss.   Describe his or her progress as it relates to calorie balance.   Develop an activity plan for the coming week.   Goals:   Record weight taken outside of class.   Track foods and beverages eaten each day in the "Food and Activity Tracker," including calories and fat grams for each item.    Track activity type, minutes you were active, and distance you reached each day in the "Food and Activity Tracker."   Set aside one 20 to 30-minute block of time every day or find two or more periods of 10 to15 minutes each for physical activity.   Make a Physical Activities Plan for the Week.   Make active lifestyle choices all through the day   Stay at or go slightly over activity goal.   Follow-Up Plan:  Attend Core Session 8 next week.   Bring completed "Food and Activity Tracker" next week to be reviewed by Lifestyle Coach.

## 2017-10-24 NOTE — Progress Notes (Signed)
I unit phlebotomy from right AC after HGB drawn at 14.7. Pt tolerated well. VSS with no sign or symptom of lightheadedness or dizziness. DC ambulatory by self

## 2017-10-24 NOTE — Telephone Encounter (Signed)
CALLED PATIENT TO INFORM OF PRE-SEED PLANNING CT, SPOKE WITH PATIENT AND HE IS AWARE OF THESE APPTS.

## 2017-10-29 ENCOUNTER — Telehealth: Payer: Self-pay | Admitting: *Deleted

## 2017-10-29 NOTE — Telephone Encounter (Signed)
CALLED PATIENT TO INFORM THAT PRE-SEED APPTS. HAVE BEEN MOVED TO 11-21-17- ARRIVAL TIME - 9:45 AM @ [facility], LVM FOR A RETURN CALL

## 2017-10-31 ENCOUNTER — Encounter: Payer: Self-pay | Admitting: Registered"

## 2017-10-31 ENCOUNTER — Ambulatory Visit (INDEPENDENT_AMBULATORY_CARE_PROVIDER_SITE_OTHER): Payer: PPO | Admitting: Physician Assistant

## 2017-10-31 ENCOUNTER — Encounter (INDEPENDENT_AMBULATORY_CARE_PROVIDER_SITE_OTHER): Payer: Self-pay | Admitting: Orthopaedic Surgery

## 2017-10-31 ENCOUNTER — Encounter (HOSPITAL_BASED_OUTPATIENT_CLINIC_OR_DEPARTMENT_OTHER): Payer: PPO | Admitting: Registered"

## 2017-10-31 DIAGNOSIS — M25562 Pain in left knee: Secondary | ICD-10-CM | POA: Diagnosis not present

## 2017-10-31 DIAGNOSIS — G8929 Other chronic pain: Secondary | ICD-10-CM | POA: Diagnosis not present

## 2017-10-31 DIAGNOSIS — R7303 Prediabetes: Secondary | ICD-10-CM

## 2017-10-31 NOTE — Progress Notes (Signed)
Patient was seen on 10/31/17 for the Core Session 8 of Diabetes Prevention Program course at [facility]. By the end of this session patients are able to complete the following objectives:   Learning Objectives:  Recognize positive and negative food and activity cues.   Change negative food and activity cues to positive cues.   Add positive cues for activity and eliminate cues for inactivity.   Develop a plan for removing one problem food cue for the coming week.   Goals:   Record weight taken outside of class.   Track foods and beverages eaten each day in the "Food and Activity Tracker," including calories and fat grams for each item.    Track activity type, minutes you were active, and distance you reached each day in the "Food and Activity Tracker."   Set aside one 20 to 30-minute block of time every day or find two or more periods of 10 to15 minutes each for physical activity.   Remove one problem food cue.   Add one positive cue for being more active.  Follow-Up Plan:  Attend Core Session 9 next week.   Bring completed "Food and Activity Tracker" next week to be reviewed by Lifestyle Coach.

## 2017-10-31 NOTE — Progress Notes (Signed)
Patient: Jonathan Vargas           Date of Birth: 31-Oct-1945           MRN: [id number] Visit Date: 10/31/2017 PCP: Leeroy Cha, MD   Assessment & Plan:  Chief Complaint:  Chief Complaint  Patient presents with  . Left Knee - Pain, Follow-up   Visit Diagnoses:  1. Chronic pain of left knee     Plan: Patient is a pleasant 72 year old gentleman who comes in today approximately 8 weeks out left knee insufficiency fracture medial femoral condyle.  He has been wearing his unloader brace which is giving him sufficient stability.  Still walking with an antalgic gait however his pain has dramatically improved.  Examination of the left knee reveals some mild tenderness to the medial femoral condyle.  Minimal swelling.  At this point, he will continue wearing the brace.  He will elevate for swelling.  He will avoid impact loading activities.  Follow-up with [place] in 4 weeks time for repeat evaluation and x-ray.  Call if concerns or questions.  Follow-Up Instructions: Return in about 4 weeks (around 11/28/2017).   Orders:  No orders of the defined types were placed in this encounter.  No orders of the defined types were placed in this encounter.   Imaging: No results found.  PMFS History: Patient Active Problem List   Diagnosis Date Noted  . Malignant neoplasm of prostate ([place]) 10/22/2017  . Chronic pain of left knee 09/30/2017  . Personal history of colonic polyps 09/26/2016  . Family history of colon cancer 09/26/2016  . Obesity 03/29/2015  . Tobacco use 03/29/2015  . 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 Depressive disorder, not elsewhere classified 12/13/2012  . Obstructive sleep apnea 12/13/2012  . Esophageal reflux 12/13/2012  . Impaired fasting glucose 12/13/2012  . Tobacco use disorder 12/13/2012  . 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 Gout, unspecified 12/13/2012  . Long term current use of anticoagulant therapy 12/13/2012  . Atrial  fibrillation ([place]) 01/30/2011  . Bradycardia 01/30/2011  . Sleep apnea 01/30/2011  . Atrial flutter ([place]) 01/25/2011  . Essential hypertension, benign 01/25/2011  . Pure hypercholesterolemia 01/25/2011   Past Medical History:  Diagnosis Date  . Acid reflux   . Arthritis   . ASD (atrial septal defect)   . Asthma with allergic rhinitis and status asthmaticus   . Atrial fib/flutter, transient   . Cancer ([place])    skin  . Chronic anticoagulation   . Depression   . ENT complaint    Dr. Erik Obey  . Erectile dysfunction   . Fatty liver   . FHx: allergies   . Gout    , Possible, Right Knee x2 ; intermittent milder episodes of pain in hte first MTP - uric acid 7.7- no treatment so far.  . Hemochromatosis   . History of kidney stones   . Hypercholesteremia   . Hyperlipemia   . Hypertension   . Hypogonadism male   . Impaired fasting glucose   . 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  . Pneumonia    " walking PNA > 40 years ago"  . Prostate cancer ([place])   . Seasonal allergies   . Sleep apnea    wears CPAP ( set at 4-11)  . Trigger finger, left    , fourth finger    Family History  Problem Relation Age of Onset  . Stroke Father   .  Diabetes type II Father   . Colon cancer Father   . Heart disease Mother        with pacemaker  . Breast cancer Sister   . Diabetes type II Maternal Grandfather   . Leukemia Paternal Uncle   . Prostate cancer Other   . Pancreatic cancer Neg Hx     Past Surgical History:  Procedure Laterality Date  . CARDIOVERSION N/A 05/29/2016   Procedure: CARDIOVERSION;  Surgeon: [name], MD;  Location: [place];  Service: Cardiovascular;  Laterality: N/A;  . CATARACT EXTRACTION W/ INTRAOCULAR LENS  IMPLANT, BILATERAL    . COLONOSCOPY W/ POLYPECTOMY    . COLONOSCOPY WITH PROPOFOL N/A 09/26/2016   Procedure: COLONOSCOPY WITH PROPOFOL;  Surgeon: Wilford Corner, MD;   Location: [place];  Service: Endoscopy;  Laterality: N/A;  . ESOPHAGOGASTRODUODENOSCOPY (EGD) WITH PROPOFOL N/A 09/26/2016   Procedure: ESOPHAGOGASTRODUODENOSCOPY (EGD) WITH PROPOFOL;  Surgeon: Wilford Corner, MD;  Location: [place];  Service: Endoscopy;  Laterality: N/A;  . LIVER BIOPSY    . NASAL SINUS SURGERY  1997   Social History   Occupational History  . Occupation: retired  Tobacco Use  . Smoking status: Current Every Day Smoker    Packs/day: 0.25    Years: 50.00    Pack years: 12.50    Types: Cigarettes    Start date: 10/30/2013  . Smokeless tobacco: Former Systems developer    Types: Chew  . Tobacco comment: trying to quit  Substance and Sexual Activity  . Alcohol use: Yes    Alcohol/week: 0.0 standard drinks    Comment: 2-3 glasses of red wine per night  . Drug use: No  . Sexual activity: Not Currently

## 2017-11-03 DIAGNOSIS — I10 Essential (primary) hypertension: Secondary | ICD-10-CM | POA: Diagnosis not present

## 2017-11-03 DIAGNOSIS — J019 Acute sinusitis, unspecified: Secondary | ICD-10-CM | POA: Diagnosis not present

## 2017-11-04 ENCOUNTER — Other Ambulatory Visit: Payer: Self-pay | Admitting: Cardiology

## 2017-11-07 ENCOUNTER — Ambulatory Visit: Payer: Self-pay | Admitting: Radiation Oncology

## 2017-11-07 ENCOUNTER — Ambulatory Visit: Payer: PPO | Admitting: Radiation Oncology

## 2017-11-07 ENCOUNTER — Encounter (HOSPITAL_BASED_OUTPATIENT_CLINIC_OR_DEPARTMENT_OTHER): Payer: PPO | Admitting: Registered"

## 2017-11-07 ENCOUNTER — Encounter: Payer: Self-pay | Admitting: Registered"

## 2017-11-07 DIAGNOSIS — R7303 Prediabetes: Secondary | ICD-10-CM | POA: Diagnosis not present

## 2017-11-07 NOTE — Progress Notes (Signed)
Patient was seen on 11/07/17 for the Core Session 9 of Diabetes Prevention Program course at [facility]. By the end of this session patients are able to complete the following objectives:   Learning Objectives:  List and describe five steps to problem solving.   Apply the five problem solving steps to resolve a problem he or she has with eating less fat and fewer calories or being more active.   Goals:   Record weight taken outside of class.   Track foods and beverages eaten each day in the "Food and Activity Tracker," including calories and fat grams for each item.    Track activity type, minutes you were active, and distance you reached each day in the "Food and Activity Tracker."   Set aside one 20 to 30-minute block of time every day or find two or more periods of 10 to15 minutes each for physical activity.   Use problem solving action plan created during session to problem solve.   Follow-Up Plan:  Attend Core Session 10 next week.   Bring completed "Food and Activity Tracker" next week to be reviewed by Lifestyle Coach.  Bring menus from favorite restaurants to next session for future discussion.

## 2017-11-10 ENCOUNTER — Ambulatory Visit: Payer: PPO

## 2017-11-10 DIAGNOSIS — I480 Paroxysmal atrial fibrillation: Secondary | ICD-10-CM

## 2017-11-10 LAB — POCT INR: INR: 2.5 (ref 2.0–3.0)

## 2017-11-10 NOTE — Patient Instructions (Signed)
Description   Continue taking 2.5 tablets daily except 2 tablets on Sundays and Fridays. Call if placed on any new medications [phone]. Recheck INR in 4 weeks.

## 2017-11-14 ENCOUNTER — Encounter: Payer: PPO | Attending: Internal Medicine | Admitting: Registered"

## 2017-11-14 ENCOUNTER — Encounter: Payer: Self-pay | Admitting: Registered"

## 2017-11-14 DIAGNOSIS — R7303 Prediabetes: Secondary | ICD-10-CM

## 2017-11-14 NOTE — Progress Notes (Signed)
Patient was seen on 11/14/17 for the Core Session 10 of Diabetes Prevention Program course at [facility]. By the end of this session patients are able to complete the following objectives:   Learning Objectives:  List and describe the four keys for healthy eating out.   Give examples of how to apply these keys at the type of restaurants that the participants go to regularly.   Make an appropriate meal selection from a restaurant menu.   Demonstrate how to ask for a substitute item using assertive language and a polite tone of voice.    Goals:   Record weight taken outside of class.   Track foods and beverages eaten each day in the "Food and Activity Tracker," including calories and fat grams for each item.    Track activity type, minutes you were active, and distance you reached each day in the "Food and Activity Tracker."   Set aside one 20 to 30-minute block of time every day or find two or more periods of 10 to15 minutes each for physical activity.   Utilize positive action plan and complete questions on "To Do List."   Follow-Up Plan:  Attend Core Session 11 next week.   Bring completed "Food and Activity Tracker" next week to be reviewed by Lifestyle Coach.

## 2017-11-20 ENCOUNTER — Telehealth: Payer: Self-pay | Admitting: *Deleted

## 2017-11-20 NOTE — Telephone Encounter (Signed)
CALLED PATIENT TO REMIND OF PRE-SEED APPTS. FOR 11-21-17, LVM FOR A RETURN CALL

## 2017-11-21 ENCOUNTER — Encounter: Payer: Self-pay | Admitting: Registered"

## 2017-11-21 ENCOUNTER — Telehealth: Payer: Self-pay | Admitting: *Deleted

## 2017-11-21 ENCOUNTER — Encounter: Payer: Self-pay | Admitting: Medical Oncology

## 2017-11-21 ENCOUNTER — Ambulatory Visit
Admission: RE | Admit: 2017-11-21 | Discharge: 2017-11-21 | Disposition: A | Discharge status: Home / Self Care | Payer: PPO | Source: Ambulatory Visit | Attending: Radiation Oncology | Admitting: Radiation Oncology

## 2017-11-21 ENCOUNTER — Encounter: Payer: PPO | Attending: Internal Medicine | Admitting: Registered"

## 2017-11-21 DIAGNOSIS — R7303 Prediabetes: Secondary | ICD-10-CM | POA: Diagnosis not present

## 2017-11-21 DIAGNOSIS — C61 Malignant neoplasm of prostate: Secondary | ICD-10-CM

## 2017-11-21 NOTE — Progress Notes (Signed)
  Radiation Oncology         [phone] ________________________________  Name: Jonathan Vargas MRN: [id number]  Date: 11/21/2017  DOB: [birth year]  SIMULATION AND TREATMENT PLANNING NOTE PUBIC ARCH STUDY  CZ:[id number], Rupashree, MD  Leeroy Cha,*  DIAGNOSIS: 72 y.o. gentleman with Stage T1c adenocarcinoma of the prostate with Gleason Score of 3+4, and PSA of 5.94.     ICD-10-CM   1. Malignant neoplasm of prostate ([place]) C61     COMPLEX SIMULATION:  The patient presented today for evaluation for possible prostate seed implant. He was brought to the radiation planning suite and placed supine on the CT couch. A 3-dimensional image study set was obtained in upload to the planning computer. There, on each axial slice, I contoured the prostate gland. Then, using three-dimensional radiation planning tools I reconstructed the prostate in view of the structures from the transperineal needle pathway to assess for possible pubic arch interference. In doing so, I did not appreciate any pubic arch interference. Also, the patient's prostate volume was estimated based on the drawn structure. The volume was 35 cc.  Given the pubic arch appearance and prostate volume, patient remains a good candidate to proceed with prostate seed implant. Today, he freely provided informed written consent to proceed.    PLAN: The patient will undergo prostate seed implant.   ________________________________  Sheral Apley. Tammi Klippel, M.D.

## 2017-11-21 NOTE — Progress Notes (Signed)
Patient was seen on 11/21/17 for the Core Session 11 of Diabetes Prevention Program course at [facility]. By the end of this session patients are able to complete the following objectives:   Learning Objectives:  Give examples of negative thoughts that could prevent them from meeting their goals of losing weight and being more physically active.   Describe how to stop negative thoughts and talk back to them with positive thoughts.   Practice 1) stopping negative thoughts and 2) talking back to negative thoughts with positive ones.    Goals:   Record weight taken outside of class.   Track foods and beverages eaten each day in the "Food and Activity Tracker," including calories and fat grams for each item.    Track activity type, minutes you were active, and distance you reached each day in the "Food and Activity Tracker."   If you have any negative thoughts-write them in your Food and Activity Trackers, along with how you talked back to them. Practice stopping negative thoughts and talking back to them with positive thoughts.   Follow-Up Plan:  Attend Core Session 12.   Bring completed "Food and Activity Tracker" next week to be reviewed by Lifestyle Coach.

## 2017-11-21 NOTE — Telephone Encounter (Signed)
Called patient to inform of implant date, lvm for a return call 

## 2017-11-21 NOTE — Progress Notes (Signed)
I met Mr. Gordillo today as the prostate nurse navigator. I was unable to meet him when he consulted with Dr. Tammi Klippel 8/07. He has chosen brachytherapy for treatment of his prostate cancer and here today for CT simulation.

## 2017-11-24 DIAGNOSIS — H40013 Open angle with borderline findings, low risk, bilateral: Secondary | ICD-10-CM | POA: Diagnosis not present

## 2017-11-24 DIAGNOSIS — H353134 Nonexudative age-related macular degeneration, bilateral, advanced atrophic with subfoveal involvement: Secondary | ICD-10-CM | POA: Diagnosis not present

## 2017-11-24 DIAGNOSIS — H04123 Dry eye syndrome of bilateral lacrimal glands: Secondary | ICD-10-CM | POA: Diagnosis not present

## 2017-11-24 DIAGNOSIS — H35033 Hypertensive retinopathy, bilateral: Secondary | ICD-10-CM | POA: Diagnosis not present

## 2017-11-27 ENCOUNTER — Other Ambulatory Visit: Payer: Self-pay | Admitting: Urology

## 2017-11-27 DIAGNOSIS — H35363 Drusen (degenerative) of macula, bilateral: Secondary | ICD-10-CM | POA: Diagnosis not present

## 2017-11-27 DIAGNOSIS — H4423 Degenerative myopia, bilateral: Secondary | ICD-10-CM | POA: Diagnosis not present

## 2017-11-27 DIAGNOSIS — H43813 Vitreous degeneration, bilateral: Secondary | ICD-10-CM | POA: Diagnosis not present

## 2017-11-27 DIAGNOSIS — H353131 Nonexudative age-related macular degeneration, bilateral, early dry stage: Secondary | ICD-10-CM | POA: Diagnosis not present

## 2017-11-28 ENCOUNTER — Ambulatory Visit (INDEPENDENT_AMBULATORY_CARE_PROVIDER_SITE_OTHER): Payer: PPO | Admitting: Physician Assistant

## 2017-11-28 ENCOUNTER — Ambulatory Visit (INDEPENDENT_AMBULATORY_CARE_PROVIDER_SITE_OTHER): Payer: PPO

## 2017-11-28 ENCOUNTER — Encounter (INDEPENDENT_AMBULATORY_CARE_PROVIDER_SITE_OTHER): Payer: Self-pay | Admitting: Orthopaedic Surgery

## 2017-11-28 VITALS — Ht 74.0 in | Wt 226.0 lb

## 2017-11-28 DIAGNOSIS — M25562 Pain in left knee: Secondary | ICD-10-CM

## 2017-11-28 DIAGNOSIS — G8929 Other chronic pain: Secondary | ICD-10-CM

## 2017-11-28 NOTE — Progress Notes (Signed)
Patient: Jonathan Vargas           Date of Birth: 17-Oct-1945           MRN: [id number] Visit Date: 11/28/2017 PCP: Leeroy Cha, MD   Assessment & Plan:  Chief Complaint:  Chief Complaint  Patient presents with  . Left Knee - Follow-up   Visit Diagnoses:  1. Chronic pain of left knee     Plan: Patient is a pleasant 72 year old gentleman who presents to our clinic today 12 weeks status post insufficiency fracture left knee femoral condyle.  He has been wearing his hinged knee brace which has significantly been helping.  He has minimal pain and soreness which occurs primarily when he is out of the brace at home.  He does note that he limps when he is out of the brace but he thinks this may be attributed to more apprehension than pain.  When he does have pain it is not long-lasting.  He does use Voltaren gel as needed.  Overall much improved.  Examination of his left knee reveals mild tenderness over the medial femoral condyle.  Range of motion 0 to 115 degrees.  He is neurovascularly intact distally.  At this point, I believe his insufficiency fracture is almost healed.  We will have him continue to wear his brace for comfort.  We will start him in physical therapy to work on strengthening and gait training.  He will follow-up with [place] in 6 weeks time for recheck.  Call with concerns or questions in the meantime.  Follow-Up Instructions: Return in about 6 weeks (around 01/09/2018).   Orders:  Orders Placed This Encounter  Procedures  . XR Knee 1-2 Views Left   No orders of the defined types were placed in this encounter.   Imaging: Xr Knee 1-2 Views Left  Result Date: 11/28/2017 X-rays show no evidence of previous insufficiency fracture to include callus formation or collapse.   PMFS History: Patient Active Problem List   Diagnosis Date Noted  . Malignant neoplasm of prostate ([place]) 10/22/2017  . Chronic pain of left knee 09/30/2017  . Personal history of  colonic polyps 09/26/2016  . Family history of colon cancer 09/26/2016  . Obesity 03/29/2015  . Tobacco use 03/29/2015  . 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 Depressive disorder, not elsewhere classified 12/13/2012  . Obstructive sleep apnea 12/13/2012  . Esophageal reflux 12/13/2012  . Impaired fasting glucose 12/13/2012  . Tobacco use disorder 12/13/2012  . 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 Gout, unspecified 12/13/2012  . Long term current use of anticoagulant therapy 12/13/2012  . Atrial fibrillation ([place]) 01/30/2011  . Bradycardia 01/30/2011  . Sleep apnea 01/30/2011  . Atrial flutter ([place]) 01/25/2011  . Essential hypertension, benign 01/25/2011  . Pure hypercholesterolemia 01/25/2011   Past Medical History:  Diagnosis Date  . Acid reflux   . Arthritis   . ASD (atrial septal defect)   . Asthma with allergic rhinitis and status asthmaticus   . Atrial fib/flutter, transient   . Cancer ([place])    skin  . Chronic anticoagulation   . Depression   . ENT complaint    Dr. Erik Obey  . Erectile dysfunction   . Fatty liver   . FHx: allergies   . Gout    , Possible, Right Knee x2 ; intermittent milder episodes of pain in hte first MTP - uric acid 7.7- no treatment so far.  . Hemochromatosis   . History  of kidney stones   . Hypercholesteremia   . Hyperlipemia   . Hypertension   . Hypogonadism male   . Impaired fasting glucose   . 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  . Pneumonia    " walking PNA > 40 years ago"  . Prostate cancer ([place])   . Seasonal allergies   . Sleep apnea    wears CPAP ( set at 4-11)  . Trigger finger, left    , fourth finger    Family History  Problem Relation Age of Onset  . Stroke Father   . Diabetes type II Father   . Colon cancer Father   . Heart disease Mother        with pacemaker  . Breast cancer Sister   . Diabetes type II Maternal Grandfather    . Leukemia Paternal Uncle   . Prostate cancer Other   . Pancreatic cancer Neg Hx     Past Surgical History:  Procedure Laterality Date  . CARDIOVERSION N/A 05/29/2016   Procedure: CARDIOVERSION;  Surgeon: [name], MD;  Location: [facility];  Service: Cardiovascular;  Laterality: N/A;  . CATARACT EXTRACTION W/ INTRAOCULAR LENS  IMPLANT, BILATERAL    . COLONOSCOPY W/ POLYPECTOMY    . COLONOSCOPY WITH PROPOFOL N/A 09/26/2016   Procedure: COLONOSCOPY WITH PROPOFOL;  Surgeon: Wilford Corner, MD;  Location: [facility];  Service: Endoscopy;  Laterality: N/A;  . ESOPHAGOGASTRODUODENOSCOPY (EGD) WITH PROPOFOL N/A 09/26/2016   Procedure: ESOPHAGOGASTRODUODENOSCOPY (EGD) WITH PROPOFOL;  Surgeon: Wilford Corner, MD;  Location: [facility];  Service: Endoscopy;  Laterality: N/A;  . LIVER BIOPSY    . NASAL SINUS SURGERY  1997   Social History   Occupational History  . Occupation: retired  Tobacco Use  . Smoking status: Current Every Day Smoker    Packs/day: 0.25    Years: 50.00    Pack years: 12.50    Types: Cigarettes    Start date: 10/30/2013  . Smokeless tobacco: Former Systems developer    Types: Chew  . Tobacco comment: trying to quit  Substance and Sexual Activity  . Alcohol use: Yes    Alcohol/week: 0.0 standard drinks    Comment: 2-3 glasses of red wine per night  . Drug use: No  . Sexual activity: Not Currently

## 2017-12-01 ENCOUNTER — Other Ambulatory Visit: Payer: Self-pay | Admitting: Cardiology

## 2017-12-01 DIAGNOSIS — I4891 Unspecified atrial fibrillation: Secondary | ICD-10-CM | POA: Diagnosis not present

## 2017-12-01 DIAGNOSIS — J4520 Mild intermittent asthma, uncomplicated: Secondary | ICD-10-CM | POA: Diagnosis not present

## 2017-12-01 DIAGNOSIS — E785 Hyperlipidemia, unspecified: Secondary | ICD-10-CM | POA: Diagnosis not present

## 2017-12-01 DIAGNOSIS — I10 Essential (primary) hypertension: Secondary | ICD-10-CM | POA: Diagnosis not present

## 2017-12-01 DIAGNOSIS — F322 Major depressive disorder, single episode, severe without psychotic features: Secondary | ICD-10-CM | POA: Diagnosis not present

## 2017-12-01 MED FILL — IRBESARTAN 150 MG TABLET: 150 | 90 days supply | Qty: 90 | Fill #0

## 2017-12-05 ENCOUNTER — Encounter: Payer: Self-pay | Admitting: Registered"

## 2017-12-05 ENCOUNTER — Ambulatory Visit (HOSPITAL_BASED_OUTPATIENT_CLINIC_OR_DEPARTMENT_OTHER): Payer: PPO | Admitting: Registered"

## 2017-12-05 DIAGNOSIS — R7303 Prediabetes: Secondary | ICD-10-CM

## 2017-12-05 NOTE — Progress Notes (Signed)
Patient was seen on 12/05/17 for the Core Session of Diabetes Prevention Program course at [facility]. By the end of this session patients are able to complete the following objectives:   Learning Objectives:  Describe ways to add interest and variety to their activity plans.  Define ?aerobic fitness.  Explain the four F.I.T.T. principles (frequency, intensity, time, and type of activity) and how they relate to aerobic fitness.   Goals:   Record weight taken outside of class.   Track foods and beverages eaten each day in the "Food and Activity Tracker," including calories and fat grams for each item.    Track activity type, minutes you were active, and distance you reached each day in the "Food and Activity Tracker."   Do your best to reach activity goal for the week.  Use one of the F.I.T.T. principles to jump start workouts.  Document activity level on the "To Do Next Week" handout.  Follow-Up Plan:  Attend Core Session next week.   Bring completed "Food and Activity Tracker" next week to be reviewed by Lifestyle Coach.

## 2017-12-08 ENCOUNTER — Telehealth: Payer: Self-pay

## 2017-12-08 ENCOUNTER — Ambulatory Visit: Payer: PPO

## 2017-12-08 DIAGNOSIS — M25562 Pain in left knee: Secondary | ICD-10-CM | POA: Diagnosis not present

## 2017-12-08 DIAGNOSIS — I480 Paroxysmal atrial fibrillation: Secondary | ICD-10-CM

## 2017-12-08 DIAGNOSIS — Z5181 Encounter for therapeutic drug level monitoring: Secondary | ICD-10-CM | POA: Diagnosis not present

## 2017-12-08 DIAGNOSIS — M25662 Stiffness of left knee, not elsewhere classified: Secondary | ICD-10-CM | POA: Diagnosis not present

## 2017-12-08 DIAGNOSIS — M799 Soft tissue disorder, unspecified: Secondary | ICD-10-CM | POA: Diagnosis not present

## 2017-12-08 DIAGNOSIS — M62552 Muscle wasting and atrophy, not elsewhere classified, left thigh: Secondary | ICD-10-CM | POA: Diagnosis not present

## 2017-12-08 LAB — POCT INR: INR: 3.3 — AB (ref 2.0–3.0)

## 2017-12-08 NOTE — Telephone Encounter (Signed)
Called LMOM TCB regarding pt's upcoming prostate seed implant scheduled for 01/23/18.  Pt states he has not been instructed as to if he will need to hold his Coumadin prior to procedure.  No clearance noted yet in chart.  Attempted to contact Dr Dahlstedt's office transferred to surgery scheduler voicemail, LM TCB regarding upcoming procedure and Warfarin dosage.

## 2017-12-08 NOTE — Patient Instructions (Addendum)
  Description   Skip today's dosage of Coumadin, then resume same dosage 2.5 tablets daily except 2 tablets on Sundays and Fridays. Call if placed on any new medications [phone]. Recheck INR in 3 weeks.

## 2017-12-09 ENCOUNTER — Telehealth: Payer: Self-pay | Admitting: *Deleted

## 2017-12-09 ENCOUNTER — Telehealth: Payer: Self-pay

## 2017-12-09 DIAGNOSIS — G4733 Obstructive sleep apnea (adult) (pediatric): Secondary | ICD-10-CM | POA: Diagnosis not present

## 2017-12-09 NOTE — Telephone Encounter (Signed)
   [facility] Pre-operative Risk Assessment    Request for surgical clearance:  1. What type of surgery is being performed? Brachytherapy and Space Bar placement   2. When is this surgery scheduled? 01/23/2018   3. What type of clearance is required (medical clearance vs. Pharmacy clearance to hold med vs. Both)? Both  4. Are there any medications that need to be held prior to surgery and how long? Coumadin and ASA  for 5 days prior to surgery    5. Practice name and name of physician performing surgery? [facility]   6. What is your office phone number [phone] ext [phone] attn Selita   7.   What is your office fax number [phone]  8.   Anesthesia type (None, local, MAC, general)

## 2017-12-09 NOTE — Telephone Encounter (Signed)
Pt takes warfarin for afib with CHADS2VASc score of 3 (age, HTN, PVD). Ok to hold warfarin for 5 days prior to procedure.  Pre op pool to address ASA clearance (requested but do not see this listed on pt medication list).

## 2017-12-09 NOTE — Telephone Encounter (Signed)
Clearance form received in 9/24 note - addressed in separate encounter.

## 2017-12-09 NOTE — Telephone Encounter (Signed)
RETURNED PATIENT'S PHONE CALL, LVM FOR A RETURN CALL 

## 2017-12-10 DIAGNOSIS — M25662 Stiffness of left knee, not elsewhere classified: Secondary | ICD-10-CM | POA: Diagnosis not present

## 2017-12-10 DIAGNOSIS — M62552 Muscle wasting and atrophy, not elsewhere classified, left thigh: Secondary | ICD-10-CM | POA: Diagnosis not present

## 2017-12-10 DIAGNOSIS — M799 Soft tissue disorder, unspecified: Secondary | ICD-10-CM | POA: Diagnosis not present

## 2017-12-10 DIAGNOSIS — M25562 Pain in left knee: Secondary | ICD-10-CM | POA: Diagnosis not present

## 2017-12-10 NOTE — Telephone Encounter (Signed)
Pt is not on asprin.  He is to be seen in the office 12/30/17 and will be cleared then.

## 2017-12-12 ENCOUNTER — Ambulatory Visit (HOSPITAL_COMMUNITY)
Admission: RE | Admit: 2017-12-12 | Discharge: 2017-12-12 | Disposition: A | Discharge status: Home / Self Care | Payer: PPO | Source: Ambulatory Visit | Attending: Urology | Admitting: Urology

## 2017-12-12 ENCOUNTER — Encounter (HOSPITAL_BASED_OUTPATIENT_CLINIC_OR_DEPARTMENT_OTHER): Payer: PPO | Admitting: Registered"

## 2017-12-12 ENCOUNTER — Encounter: Payer: Self-pay | Admitting: Registered"

## 2017-12-12 ENCOUNTER — Encounter (HOSPITAL_COMMUNITY)
Admission: RE | Admit: 2017-12-12 | Discharge: 2017-12-12 | Disposition: A | Discharge status: Home / Self Care | Payer: PPO | Source: Ambulatory Visit | Attending: Urology | Admitting: Urology

## 2017-12-12 DIAGNOSIS — C61 Malignant neoplasm of prostate: Secondary | ICD-10-CM | POA: Insufficient documentation

## 2017-12-12 DIAGNOSIS — Z01818 Encounter for other preprocedural examination: Secondary | ICD-10-CM

## 2017-12-12 DIAGNOSIS — R7303 Prediabetes: Secondary | ICD-10-CM

## 2017-12-12 DIAGNOSIS — I10 Essential (primary) hypertension: Secondary | ICD-10-CM | POA: Diagnosis not present

## 2017-12-12 DIAGNOSIS — I4891 Unspecified atrial fibrillation: Secondary | ICD-10-CM | POA: Diagnosis not present

## 2017-12-12 NOTE — Progress Notes (Signed)
Patient was seen on 12/12/17 for the Core Session 13 of Diabetes Prevention Program course at [facility]. By the end of this session patients are able to complete the following objectives:   Learning Objectives:  Describe their current progress toward defined goals.  Describe common causes for slipping from healthy eating or being active.  Explain what to do to get back on their feet after a slip.  Goals:   Record weight taken outside of class.   Track foods and beverages eaten each day in the "Food and Activity Tracker," including calories and fat grams for each item.    Track activity type, minutes active, and distance reached each day in the "Food and Activity Tracker."   Try out the two action plans created during session- "Slips from Healthy Eating: Action Plan" and "Slips from Being Active: Action Plan"  Answer questions on the handout.   Follow-Up Plan:  Attend Core Session 14 next week.   Bring completed "Food and Activity Tracker" next week to be reviewed by Lifestyle Coach.

## 2017-12-16 DIAGNOSIS — M25562 Pain in left knee: Secondary | ICD-10-CM | POA: Diagnosis not present

## 2017-12-16 DIAGNOSIS — M25662 Stiffness of left knee, not elsewhere classified: Secondary | ICD-10-CM | POA: Diagnosis not present

## 2017-12-16 DIAGNOSIS — M62552 Muscle wasting and atrophy, not elsewhere classified, left thigh: Secondary | ICD-10-CM | POA: Diagnosis not present

## 2017-12-16 DIAGNOSIS — M799 Soft tissue disorder, unspecified: Secondary | ICD-10-CM | POA: Diagnosis not present

## 2017-12-18 DIAGNOSIS — M25662 Stiffness of left knee, not elsewhere classified: Secondary | ICD-10-CM | POA: Diagnosis not present

## 2017-12-18 DIAGNOSIS — M25562 Pain in left knee: Secondary | ICD-10-CM | POA: Diagnosis not present

## 2017-12-18 DIAGNOSIS — M62552 Muscle wasting and atrophy, not elsewhere classified, left thigh: Secondary | ICD-10-CM | POA: Diagnosis not present

## 2017-12-18 DIAGNOSIS — M799 Soft tissue disorder, unspecified: Secondary | ICD-10-CM | POA: Diagnosis not present

## 2017-12-19 ENCOUNTER — Encounter: Payer: PPO | Attending: Internal Medicine | Admitting: Registered"

## 2017-12-19 ENCOUNTER — Encounter: Payer: Self-pay | Admitting: Registered"

## 2017-12-19 DIAGNOSIS — R7303 Prediabetes: Secondary | ICD-10-CM | POA: Diagnosis not present

## 2017-12-19 NOTE — Progress Notes (Signed)
Patient was seen on 12/19/17 for the Core Session 14 of Diabetes Prevention Program course at [facility]. By the end of this session patients are able to complete the following objectives:   Learning Objectives:  Give examples of problem social cues and helpful social cues.   Explain how to remove problem social cues and add helpful ones.   Describe ways of coping with vacations and social events such as parties, holidays, and visits from relatives and friends.   Create an action plan to change a problem social cue and add a helpful one.   Goals:   Record weight taken outside of class.   Track foods and beverages eaten each day in the "Food and Activity Tracker," including calories and fat grams for each item.    Track activity type, minutes you were active, and distance you reached each day in the "Food and Activity Tracker."   Do your best to reach activity goal for the week.  Use action plan created during session to change a problem social cue and add a helpful social cue.   Answer questions regarding success of changing social cues on "To Do Next Week" handout.   Follow-Up Plan:  Attend Core Session 15 next week.   Bring completed "Food and Activity Tracker" next week to be reviewed by Lifestyle Coach.

## 2017-12-21 ENCOUNTER — Other Ambulatory Visit: Payer: Self-pay | Admitting: Cardiology

## 2017-12-23 DIAGNOSIS — M62552 Muscle wasting and atrophy, not elsewhere classified, left thigh: Secondary | ICD-10-CM | POA: Diagnosis not present

## 2017-12-23 DIAGNOSIS — M799 Soft tissue disorder, unspecified: Secondary | ICD-10-CM | POA: Diagnosis not present

## 2017-12-23 DIAGNOSIS — M25562 Pain in left knee: Secondary | ICD-10-CM | POA: Diagnosis not present

## 2017-12-23 DIAGNOSIS — M25662 Stiffness of left knee, not elsewhere classified: Secondary | ICD-10-CM | POA: Diagnosis not present

## 2017-12-25 ENCOUNTER — Telehealth: Payer: Self-pay

## 2017-12-25 DIAGNOSIS — M25562 Pain in left knee: Secondary | ICD-10-CM | POA: Diagnosis not present

## 2017-12-25 DIAGNOSIS — M799 Soft tissue disorder, unspecified: Secondary | ICD-10-CM | POA: Diagnosis not present

## 2017-12-25 DIAGNOSIS — M62552 Muscle wasting and atrophy, not elsewhere classified, left thigh: Secondary | ICD-10-CM | POA: Diagnosis not present

## 2017-12-25 DIAGNOSIS — M25662 Stiffness of left knee, not elsewhere classified: Secondary | ICD-10-CM | POA: Diagnosis not present

## 2017-12-25 NOTE — Telephone Encounter (Signed)
   [facility] Pre-operative Risk Assessment    Request for surgical clearance:  1. What type of surgery is being performed? Brachytherapy and Spaceoar placement   2. When is this surgery scheduled? 01/23/18   3. What type of clearance is required (medical clearance vs. Pharmacy clearance to hold med vs. Both)? both  4. Are there any medications that need to be held prior to surgery and how long? Coumadin 5 days   5. Practice name and name of physician performing surgery? [facility]/ Dr Eulogio Ditch   6. What is your office phone number [phone]    7.   What is your office fax number [phone]  8.   Anesthesia type (None, local, MAC, general) ?     Legrand Como  Marquee Fuchs 12/25/2017, 2:52 PM  _________________________________________________________________   (provider comments below)

## 2017-12-26 ENCOUNTER — Encounter (HOSPITAL_BASED_OUTPATIENT_CLINIC_OR_DEPARTMENT_OTHER): Payer: PPO | Admitting: Registered"

## 2017-12-26 ENCOUNTER — Encounter: Payer: Self-pay | Admitting: Registered"

## 2017-12-26 DIAGNOSIS — L814 Other melanin hyperpigmentation: Secondary | ICD-10-CM | POA: Diagnosis not present

## 2017-12-26 DIAGNOSIS — D2272 Melanocytic nevi of left lower limb, including hip: Secondary | ICD-10-CM | POA: Diagnosis not present

## 2017-12-26 DIAGNOSIS — I788 Other diseases of capillaries: Secondary | ICD-10-CM | POA: Diagnosis not present

## 2017-12-26 DIAGNOSIS — E785 Hyperlipidemia, unspecified: Secondary | ICD-10-CM | POA: Diagnosis not present

## 2017-12-26 DIAGNOSIS — Z1389 Encounter for screening for other disorder: Secondary | ICD-10-CM | POA: Diagnosis not present

## 2017-12-26 DIAGNOSIS — I10 Essential (primary) hypertension: Secondary | ICD-10-CM | POA: Diagnosis not present

## 2017-12-26 DIAGNOSIS — D225 Melanocytic nevi of trunk: Secondary | ICD-10-CM | POA: Diagnosis not present

## 2017-12-26 DIAGNOSIS — Z23 Encounter for immunization: Secondary | ICD-10-CM | POA: Diagnosis not present

## 2017-12-26 DIAGNOSIS — R7301 Impaired fasting glucose: Secondary | ICD-10-CM | POA: Diagnosis not present

## 2017-12-26 DIAGNOSIS — Z85828 Personal history of other malignant neoplasm of skin: Secondary | ICD-10-CM | POA: Diagnosis not present

## 2017-12-26 DIAGNOSIS — D1801 Hemangioma of skin and subcutaneous tissue: Secondary | ICD-10-CM | POA: Diagnosis not present

## 2017-12-26 DIAGNOSIS — D2271 Melanocytic nevi of right lower limb, including hip: Secondary | ICD-10-CM | POA: Diagnosis not present

## 2017-12-26 DIAGNOSIS — F17210 Nicotine dependence, cigarettes, uncomplicated: Secondary | ICD-10-CM | POA: Diagnosis not present

## 2017-12-26 DIAGNOSIS — E669 Obesity, unspecified: Secondary | ICD-10-CM | POA: Diagnosis not present

## 2017-12-26 DIAGNOSIS — L570 Actinic keratosis: Secondary | ICD-10-CM | POA: Diagnosis not present

## 2017-12-26 DIAGNOSIS — L821 Other seborrheic keratosis: Secondary | ICD-10-CM | POA: Diagnosis not present

## 2017-12-26 DIAGNOSIS — Z0000 Encounter for general adult medical examination without abnormal findings: Secondary | ICD-10-CM | POA: Diagnosis not present

## 2017-12-26 DIAGNOSIS — R7303 Prediabetes: Secondary | ICD-10-CM

## 2017-12-26 DIAGNOSIS — F322 Major depressive disorder, single episode, severe without psychotic features: Secondary | ICD-10-CM | POA: Diagnosis not present

## 2017-12-26 DIAGNOSIS — I4891 Unspecified atrial fibrillation: Secondary | ICD-10-CM | POA: Diagnosis not present

## 2017-12-26 DIAGNOSIS — G4733 Obstructive sleep apnea (adult) (pediatric): Secondary | ICD-10-CM | POA: Diagnosis not present

## 2017-12-26 NOTE — Telephone Encounter (Signed)
This was addressed in prior phone note from 12/09/17. It looks like he is seeing [name] on 12/30/17 and we are awaiting cardiac clearance from that visit.

## 2017-12-26 NOTE — Progress Notes (Signed)
Patient was seen on 12/26/17 for the Core Session 15 of Diabetes Prevention Program course at [facility]. By the end of this session patients are able to complete the following objectives:   Learning Objectives:  Explain how to prevent stress or cope with unavoidable stress.   Describe how this program can be a source of stress.   Explain how to manage stressful situations.   Create and follow an action plan for either preventing or coping with a stressful situation.   Goals:   Record weight taken outside of class.   Track foods and beverages eaten each day in the "Food and Activity Tracker," including calories and fat grams for each item.    Track activity type, minutes you were active, and distance you reached each day in the "Food and Activity Tracker."   Do your best to reach activity goal for the week.  Follow your action plan to reduce stress.   Answer questions on handout regarding success of action plan.   Follow-Up Plan:  Attend Core Session 16.   Bring completed "Food and Activity Tracker" next week to be reviewed by Lifestyle Coach.

## 2017-12-26 NOTE — Telephone Encounter (Signed)
Please address coumadin

## 2017-12-30 ENCOUNTER — Ambulatory Visit (INDEPENDENT_AMBULATORY_CARE_PROVIDER_SITE_OTHER): Payer: PPO | Admitting: Nurse Practitioner

## 2017-12-30 ENCOUNTER — Encounter: Payer: Self-pay | Admitting: Nurse Practitioner

## 2017-12-30 ENCOUNTER — Ambulatory Visit (INDEPENDENT_AMBULATORY_CARE_PROVIDER_SITE_OTHER): Payer: PPO | Admitting: *Deleted

## 2017-12-30 VITALS — BP 132/72 | HR 54 | Ht 74.0 in | Wt 218.0 lb

## 2017-12-30 DIAGNOSIS — Z7901 Long term (current) use of anticoagulants: Secondary | ICD-10-CM | POA: Diagnosis not present

## 2017-12-30 DIAGNOSIS — M25562 Pain in left knee: Secondary | ICD-10-CM | POA: Diagnosis not present

## 2017-12-30 DIAGNOSIS — I480 Paroxysmal atrial fibrillation: Secondary | ICD-10-CM

## 2017-12-30 DIAGNOSIS — M62552 Muscle wasting and atrophy, not elsewhere classified, left thigh: Secondary | ICD-10-CM | POA: Diagnosis not present

## 2017-12-30 DIAGNOSIS — I10 Essential (primary) hypertension: Secondary | ICD-10-CM | POA: Diagnosis not present

## 2017-12-30 DIAGNOSIS — M799 Soft tissue disorder, unspecified: Secondary | ICD-10-CM | POA: Diagnosis not present

## 2017-12-30 DIAGNOSIS — R079 Chest pain, unspecified: Secondary | ICD-10-CM | POA: Diagnosis not present

## 2017-12-30 DIAGNOSIS — I4892 Unspecified atrial flutter: Secondary | ICD-10-CM

## 2017-12-30 DIAGNOSIS — Z01818 Encounter for other preprocedural examination: Secondary | ICD-10-CM

## 2017-12-30 DIAGNOSIS — M25662 Stiffness of left knee, not elsewhere classified: Secondary | ICD-10-CM | POA: Diagnosis not present

## 2017-12-30 LAB — POCT INR: INR: 3.3 — AB (ref 2.0–3.0)

## 2017-12-30 NOTE — Patient Instructions (Addendum)
We will be checking the following labs today - NONE    Medication Instructions:    Continue with your current medicines.    If you need a refill on your cardiac medications before your next appointment, please call your pharmacy.     Testing/Procedures To Be Arranged:  [place] are scheduled for a Myocardial Perfusion Imaging Study on ___________________________________ at ________________________________________________.   Please arrive 15 minutes prior to your appointment time for registration and insurance purposes.   The test will take approximately 3 to 4 hours to complete; you may bring reading material. If someone comes with you to your appointment, they will need to remain in the main lobby due to limited space in the testing area.   How to prepare for your Myocardial Perfusion test:   Do not eat or drink 3 hours prior to your test, except you may have water.    Do not consume products containing caffeine (regular or decaffeinated) 12 hours prior to your test (ex: coffee, chocolate, soda, tea)   Do bring a list of your current medications with you. If not listed below, you may take your medications as normal.    Bring any held medication to your appointment, as you may be required to take it once the test is complete.   Do wear comfortable clothes (no dresses or overalls) and walking shoes. Tennis shoes are preferred. No heels or open toed shoes.  Do not wear cologne, perfume, aftershave or lotions (deodorant is allowed).   If these instructions are not followed, you test will have to be rescheduled.   Please report to [address] for your test. If you have questions or concerns about your appointment, please call the Nuclear Lab at [phone].  If you cannot keep your appointment, please provide 24 hour notification to the Nuclear lab to avoid a possible $50 charge to your account.       Follow-Up:   See me in 4  months  I will send a note to Dr. Clyde Lundborg are to hold your coumadin 5 days prior to your GU surgery    At [facility], you and your health needs are our priority.  As part of our continuing mission to provide you with exceptional heart care, we have created designated Provider Care Teams.  These Care Teams include your primary Cardiologist (physician) and Advanced Practice Providers (APPs -  Physician Assistants and Nurse Practitioners) who all work together to provide you with the care you need, when you need it.  Special Instructions:  . None  Call the [place] office at [phone] if you have any questions, problems or concerns.

## 2017-12-30 NOTE — Progress Notes (Addendum)
CARDIOLOGY OFFICE NOTE  Date:  12/30/2017    Jonathan Vargas Date of Birth: [birth year] Medical Record #[id number]  PCP:  Leeroy Cha, MD  Cardiologist:  Marisa Cyphers    Chief Complaint  Patient presents with  . Atrial Fibrillation  . Atrial Flutter    Follow up visit - seen for Dr. Marlou Porch  . Pre-op Exam    History of Present Illness: Jonathan Vargas is a 72 y.o. male who presents today for a 3 month check. Seen for Dr. Marlou Porch & [facility].   [name]".   He has a history of atrial fibrillation/flutter paroxysmal on antiarrhythmic and is on chronic warfarin for his anticoagulation. Had stress test 09/01/12 that demonstrated a mild inferior wall defect, possible diaphragmatic attenuation, overall low risk study with normal ejection fraction. Other issues include HTN, prior carotid artery dissection in 1997, ongoing tobacco abuse, hemachromatosis. Previous hospitalization in November 2012 demonstrated paroxysmal atrial flutter which converted after 4 hours of diltiazem. He had a postconversion pause of 8.2 seconds, felt flushed. Endorses chronic palpitations.   I saw him as a work in back in March of 2018 - out of rhythm. Somewhat symptomatic. Got him cardioverted and back to see [name] for discussion of other options - but opted for his continued regimen. ARB was restarted for elevated BP. Patient elected to continue CCB therapy despite bradycardia. He was continued on his anticoagulation with coumadin.   Dr. Marlou Porch and I have followed him since. He has had intermittent issues with his BP. He has ongoing use of alcohol. He has had to have CCB eventually stopped due to presyncope. There was concern for post conversion pauses which has been documented in the past.   I last saw him in July - he was still smoking some. Still drinking. Was going to diabetic classes and was trying to work on his weight. He had had a stress leg fracture at last visit - had  stepped off a curb.   Comes in today. Here alone.He is still going to his diabetic classes. Still losing weight. He continues to have issues with his left leg from the prior stress fracture. This has limited his ability to exercise. He is planning on GU surgery/seed implant per his report for his prostate cancer in November. Needs clearance. He says it would "take a long time" to walk a block. He also endorses some atypical chest pain - tends to happen at night. He is pretty vague in his description about his. He is planning a river trip over the holidays. He is still smoking about 5 to 6 cigs per day and having 2 glasses of wine each night. No problems with his coumadin. He is not dizzy. Denies any presyncope/syncopal spells.    Past Medical History:  Diagnosis Date  . Acid reflux   . Arthritis   . ASD (atrial septal defect)   . Asthma with allergic rhinitis and status asthmaticus   . Atrial fib/flutter, transient   . Cancer ([place])    skin  . Chronic anticoagulation   . Depression   . ENT complaint    Dr. Erik Obey  . Erectile dysfunction   . Fatty liver   . FHx: allergies   . Gout    , Possible, Right Knee x2 ; intermittent milder episodes of pain in hte first MTP - uric acid 7.7- no treatment so far.  . Hemochromatosis   . History of kidney stones   .  Hypercholesteremia   . Hyperlipemia   . Hypertension   . Hypogonadism male   . Impaired fasting glucose   . 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  . Pneumonia    " walking PNA > 40 years ago"  . Prostate cancer ([place])   . Seasonal allergies   . Sleep apnea    wears CPAP ( set at 4-11)  . Trigger finger, left    , fourth finger    Past Surgical History:  Procedure Laterality Date  . CARDIOVERSION N/A 05/29/2016   Procedure: CARDIOVERSION;  Surgeon: [name], MD;  Location: [facility];  Service: Cardiovascular;  Laterality: N/A;  . CATARACT  EXTRACTION W/ INTRAOCULAR LENS  IMPLANT, BILATERAL    . COLONOSCOPY W/ POLYPECTOMY    . COLONOSCOPY WITH PROPOFOL N/A 09/26/2016   Procedure: COLONOSCOPY WITH PROPOFOL;  Surgeon: Wilford Corner, MD;  Location: [facility];  Service: Endoscopy;  Laterality: N/A;  . ESOPHAGOGASTRODUODENOSCOPY (EGD) WITH PROPOFOL N/A 09/26/2016   Procedure: ESOPHAGOGASTRODUODENOSCOPY (EGD) WITH PROPOFOL;  Surgeon: Wilford Corner, MD;  Location: [facility];  Service: Endoscopy;  Laterality: N/A;  . LIVER BIOPSY    . NASAL SINUS SURGERY  1997     Medications: Current Meds  Medication Sig  . albuterol (PROVENTIL HFA;VENTOLIN HFA) 108 (90 Base) MCG/ACT inhaler Inhale 2 puffs into the lungs every 6 (six) hours as needed for wheezing or shortness of breath.  . allopurinol (ZYLOPRIM) 300 MG tablet Take 300 mg by mouth daily.  Marland Kitchen CALCIUM PO Take 200 mg by mouth 2 (two) times daily.  Marland Kitchen COLCRYS 0.6 MG tablet Take 0.6 mg by mouth 2 (two) times daily as needed (for gout).   Marland Kitchen diclofenac sodium (VOLTAREN) 1 % GEL Apply 1 application topically 4 (four) times daily as needed (for pain).   . fexofenadine (ALLEGRA) 60 MG tablet Take 60 mg by mouth daily as needed. For allergies  . flecainide (TAMBOCOR) 50 MG tablet TAKE 1 TABLET BY MOUTH 2 TIMES DAILY  . fluticasone (FLONASE) 50 MCG/ACT nasal spray Place 1 spray into the nose daily.   . irbesartan (AVAPRO) 150 MG tablet TAKE 1 TABLET BY MOUTH DAILY.  Marland Kitchen MAGNESIUM PO Take 1 capsule by mouth daily.  . Misc Natural Products (LUTEIN 20) CAPS Take 40 mg by mouth 2 (two) times daily.   . NON FORMULARY CPAP  . TURMERIC PO Take 1 capsule by mouth daily.  . vitamin B-12 (CYANOCOBALAMIN) 1000 MCG tablet Take 1,000 mcg by mouth daily.  Marland Kitchen warfarin (COUMADIN) 5 MG tablet TAKE 2 OR 2.5 TABLETS DAILY AS DIRECTED BY COUMADIN CLINIC     Allergies: Allergies  Allergen Reactions  . Clindamycin/Lincomycin Diarrhea and Other (See Comments)    Stomach pain   . Ciprofloxacin Other  (See Comments)    Joint pain  . Amoxicillin Rash    Social History: The patient  reports that he has been smoking cigarettes. He started smoking about 4 years ago. He has a 12.50 pack-year smoking history. He has quit using smokeless tobacco.  His smokeless tobacco use included chew. He reports that he drinks alcohol. He reports that he does not use drugs.   Family History: The patient's family history includes Breast cancer in his sister; Colon cancer in his father; Diabetes type II in his father and maternal grandfather; Heart disease in his mother; Leukemia in his paternal uncle; Prostate cancer in his other; Stroke in his father.   Review of Systems: Please  see the history of present illness.   Otherwise, the review of systems is positive for none.   All other systems are reviewed and negative.   Physical Exam: VS:  BP 132/72 (BP Location: Left Arm, Patient Position: Sitting, Cuff Size: Normal)   Pulse (!) 54   Ht 6\' 2"  (1.88 m)   Wt 218 lb (98.9 kg)   BMI 27.99 kg/m  .  BMI Body mass index is 27.99 kg/m.  Wt Readings from Last 3 Encounters:  12/30/17 218 lb (98.9 kg)  12/26/17 219 lb 9.6 oz (99.6 kg)  12/19/17 220 lb 1.6 oz (99.8 kg)    General: Pleasant. Well developed, well nourished and in no acute distress.  Weight is down from 226 from July.  HEENT: Normal.  Neck: Supple, no JVD, carotid bruits, or masses noted.  Cardiac: Regular rate and rhythm. No murmurs, rubs, or gallops. No edema.  Respiratory:  Lungs are clear to auscultation bilaterally with normal work of breathing.  GI: Soft and nontender.  MS: No deformity or atrophy. Gait and ROM intact. He is using a cane today.  Skin: Warm and dry. Color is normal.  Neuro:  Strength and sensation are intact and no gross focal deficits noted.  Psych: Alert, appropriate and with normal affect.   LABORATORY DATA:  EKG:  EKG is ordered today. This demonstrates sinus bradycardia - HR is 54.  Lab Results  Component Value  Date   WBC 5.8 11/26/2016   HGB 14.7 10/24/2017   HCT 44.4 11/26/2016   PLT 207 11/26/2016   GLUCOSE 124 (H) 11/26/2016   NA 139 11/26/2016   K 4.9 11/26/2016   CL 104 11/26/2016   CREATININE 1.12 11/26/2016   BUN 16 11/26/2016   CO2 22 11/26/2016   TSH 3.400 05/27/2016   INR 3.3 (A) 12/30/2017       BNP (last 3 results) No results for input(s): BNP in the last 8760 hours.  ProBNP (last 3 results) No results for input(s): PROBNP in the last 8760 hours.   Other Studies Reviewed Today:   Echo Study Conclusions 05/2016  - Left ventricle: The cavity size was normal. Wall thickness was increased in a pattern of mild LVH. Systolic function was normal. The estimated ejection fraction was in the range of 55% to 60%. Wall motion was normal; there were no regional wall motion abnormalities. Left ventricular diastolic function parameters were normal. - Left atrium: The atrium was moderately dilated. - Atrial septum: No defect or patent foramen ovale was identified.  Procedure: Electrical Cardioversion Indications:Atrial Flutter  Procedure Details Consent: Risks of procedure as well as the alternatives and risks of each were explained to the (patient/caregiver). Consent for procedure obtained. Time OVZ:CHYIFOYD patient identification, verified procedure, site/side was marked, verified correct patient position, special equipment/implants available, medications/allergies/relevent history reviewed, required imaging and test results available. Performed  Patient placed on cardiac monitor, pulse oximetry, supplemental oxygen as necessary.  Sedation given: Propofol 70 mg Pacer pads placed anterior and posterior chest. Cardioverted 1time(s).  Cardioverted at 120J. Evaluation Findings: Post procedure EKG shows: NSR Complications: None Patient didtolerate procedure well.  [name], MD 05/29/2016, 1:53 PM    Assessment/Plan:  1. Pre op  clearance for GU surgery - has gotten more sedentary with his stress fracture of the left leg. He smokes. He has HTN and HLD as well. Will arrange for [facility] - his last study was 5 years ago - further disposition to follow. Pharmacy has recommended holding coumadin 5  days prior to surgery.   2. PAF/flutter - last cardioversion in 2018. Has seen EP in the past - opted to continue with current regimen for his PAF. He remains in sinus brady today by EKG and exam. He is not dizzy or having any presyncope. No indication for PPM at this time. There is still concern that he has had prior post conversion pauses.   3. HTN - BP fine today. No changes made.    4. Chronic anticoagulation with coumadin - followed here - no problems noted.   5. Obesity - he is still attending his diabetic classes and continues to be successful with his weight loss.   6. 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   Current medicines are reviewed with the patient today.  The patient does not have concerns regarding medicines other than what has been noted above.  The following changes have been made:  See above.  Labs/ tests ordered today include:    Orders Placed This Encounter  Procedures  . MYOCARDIAL PERFUSION IMAGING  . EKG 12-Lead     Disposition:   FU with me in 4 months.   Patient is agreeable to this plan and will call if any problems develop in the interim.   SignedTruitt Merle, NP  12/30/2017 9:54 AM  [facility] [address] [place], [place]  [address] Phone: [phone] Fax: [phone]

## 2017-12-30 NOTE — Patient Instructions (Signed)
Description   Skip today's dosage of Coumadin, then resume same dosage 2.5 tablets daily except 2 tablets on Sundays and Fridays. Increase your leafy green intake by 1 additional serving each week.  Call if placed on any new medications [phone]. Recheck INR in 2 weeks.

## 2018-01-01 DIAGNOSIS — M25562 Pain in left knee: Secondary | ICD-10-CM | POA: Diagnosis not present

## 2018-01-01 DIAGNOSIS — M25662 Stiffness of left knee, not elsewhere classified: Secondary | ICD-10-CM | POA: Diagnosis not present

## 2018-01-01 DIAGNOSIS — M62552 Muscle wasting and atrophy, not elsewhere classified, left thigh: Secondary | ICD-10-CM | POA: Diagnosis not present

## 2018-01-01 DIAGNOSIS — M799 Soft tissue disorder, unspecified: Secondary | ICD-10-CM | POA: Diagnosis not present

## 2018-01-05 ENCOUNTER — Telehealth: Payer: Self-pay | Admitting: Radiation Oncology

## 2018-01-05 ENCOUNTER — Telehealth (HOSPITAL_COMMUNITY): Payer: Self-pay | Admitting: *Deleted

## 2018-01-05 NOTE — Telephone Encounter (Signed)
Left message on voicemail per DPR in reference to upcoming appointment scheduled on 01/07/18 at 0715 with detailed instructions given per Myocardial Perfusion Study Information Sheet for the test. LM to arrive 15 minutes early, and that it is imperative to arrive on time for appointment to keep from having the test rescheduled. If you need to cancel or reschedule your appointment, please call the office within 24 hours of your appointment. Failure to do so may result in a cancellation of your appointment, and a $50 no show fee. Phone number given for call back for any questions. Chey Cho, [place]e [place]

## 2018-01-05 NOTE — Progress Notes (Signed)
Understand from Romie Jumper the patient has questions. Phoned patient's home. No answer. Left message with my contact information. Awaiting return call.

## 2018-01-06 ENCOUNTER — Other Ambulatory Visit: Payer: Self-pay | Admitting: Urology

## 2018-01-06 DIAGNOSIS — C61 Malignant neoplasm of prostate: Secondary | ICD-10-CM

## 2018-01-07 ENCOUNTER — Ambulatory Visit (HOSPITAL_COMMUNITY): Payer: PPO | Attending: Cardiology

## 2018-01-07 DIAGNOSIS — M799 Soft tissue disorder, unspecified: Secondary | ICD-10-CM | POA: Diagnosis not present

## 2018-01-07 DIAGNOSIS — M25562 Pain in left knee: Secondary | ICD-10-CM | POA: Diagnosis not present

## 2018-01-07 DIAGNOSIS — I480 Paroxysmal atrial fibrillation: Secondary | ICD-10-CM | POA: Diagnosis not present

## 2018-01-07 DIAGNOSIS — R079 Chest pain, unspecified: Secondary | ICD-10-CM | POA: Insufficient documentation

## 2018-01-07 DIAGNOSIS — M62552 Muscle wasting and atrophy, not elsewhere classified, left thigh: Secondary | ICD-10-CM | POA: Diagnosis not present

## 2018-01-07 DIAGNOSIS — M25662 Stiffness of left knee, not elsewhere classified: Secondary | ICD-10-CM | POA: Diagnosis not present

## 2018-01-07 LAB — MYOCARDIAL PERFUSION IMAGING
LV dias vol: 109 mL (ref 62–150)
LV sys vol: 49 mL
Peak HR: 61 {beats}/min
Rest HR: 41 {beats}/min
SDS: 5
SRS: 1
SSS: 6
TID: 1.03

## 2018-01-07 MED ORDER — REGADENOSON 0.4 MG/5ML IV SOLN
0.4000 mg | Freq: Once | INTRAVENOUS | Status: AC
  Administered 2018-01-07: 0.4 mg via INTRAVENOUS

## 2018-01-07 MED ORDER — TECHNETIUM TC 99M TETROFOSMIN IV KIT
31.9000 | PACK | Freq: Once | INTRAVENOUS | Status: AC | PRN
  Administered 2018-01-07: 31.9 via INTRAVENOUS
  Filled 2018-01-07: qty 32

## 2018-01-07 MED ORDER — TECHNETIUM TC 99M TETROFOSMIN IV KIT
10.6000 | PACK | Freq: Once | INTRAVENOUS | Status: AC | PRN
  Administered 2018-01-07: 10.6 via INTRAVENOUS
  Filled 2018-01-07: qty 11

## 2018-01-08 DIAGNOSIS — M799 Soft tissue disorder, unspecified: Secondary | ICD-10-CM | POA: Diagnosis not present

## 2018-01-08 DIAGNOSIS — M25562 Pain in left knee: Secondary | ICD-10-CM | POA: Diagnosis not present

## 2018-01-08 DIAGNOSIS — M62552 Muscle wasting and atrophy, not elsewhere classified, left thigh: Secondary | ICD-10-CM | POA: Diagnosis not present

## 2018-01-08 DIAGNOSIS — M25662 Stiffness of left knee, not elsewhere classified: Secondary | ICD-10-CM | POA: Diagnosis not present

## 2018-01-09 ENCOUNTER — Ambulatory Visit (HOSPITAL_BASED_OUTPATIENT_CLINIC_OR_DEPARTMENT_OTHER): Payer: PPO | Admitting: Registered"

## 2018-01-09 ENCOUNTER — Telehealth: Payer: Self-pay | Admitting: *Deleted

## 2018-01-09 ENCOUNTER — Encounter: Payer: Self-pay | Admitting: Registered"

## 2018-01-09 ENCOUNTER — Telehealth: Payer: Self-pay | Admitting: Pharmacist

## 2018-01-09 ENCOUNTER — Ambulatory Visit (INDEPENDENT_AMBULATORY_CARE_PROVIDER_SITE_OTHER): Payer: PPO | Admitting: Orthopaedic Surgery

## 2018-01-09 DIAGNOSIS — M7632 Iliotibial band syndrome, left leg: Secondary | ICD-10-CM | POA: Diagnosis not present

## 2018-01-09 DIAGNOSIS — R7303 Prediabetes: Secondary | ICD-10-CM

## 2018-01-09 NOTE — Telephone Encounter (Signed)
Jonathan Vargas, from [facility], calling for clarification on clearance. I advised on clarification on hold for warfarin (previously cleared to hold 5 days without lovenox). She states that they received office note and Lexiscan, but it does not states specifically ok to proceed with procedure. Clarification is needed. Please call her at [phone] ext [phone] to provide clarification.    Request for surgical clearance:  1. What type of surgery is being performed? Brachytherapy and Space Bar placement   2. When is this surgery scheduled? 01/23/2018   3. What type of clearance is required (medical clearance vs. Pharmacy clearance to hold med vs. Both)? Both  4. Are there any medications that need to be held prior to surgery and how long? Coumadin and ASA  for 5 days prior to surgery    5. Practice name and name of physician performing surgery? [facility]   6. What is your office phone number [phone] ext [phone] attn Jonathan Vargas   7.   What is your office fax number [phone]  8.   Anesthesia type (None, local, MAC, general)

## 2018-01-09 NOTE — Telephone Encounter (Signed)
CALLED PATIENT TO INFORM OF LAB APPT. ON 01-16-18 - ARRIVAL TIME- 12:45 PM @ [facility] ADMITTING, LVM FOR A RETURN CALL

## 2018-01-09 NOTE — Telephone Encounter (Signed)
   Primary Cardiologist: Candee Furbish, MD  Chart reviewed as part of pre-operative protocol coverage. Given past medical history and time since last visit, based on ACC/AHA guidelines, Jonathan Vargas would be at acceptable risk for the planned procedure without further cardiovascular testing.   I will route this recommendation to the requesting party via Epic fax function and remove from pre-op pool.  Please call with questions.  [place], [place] 01/09/2018, 11:51 AM

## 2018-01-09 NOTE — Progress Notes (Signed)
Patient was seen on 01/09/18 for the Core Session 16 of Diabetes Prevention Program course at [facility]. By the end of this session patients are able to complete the following objectives:   Learning Objectives:  Measure their progress toward weight and physical activity goals since Session 1.   Develop a plan for improving progress, if their goals have not yet been attained.   Describe ways to stay motivated long-term.   Goals:   Record weight taken outside of class.   Track foods and beverages eaten each day in the "Food and Activity Tracker," including calories and fat grams for each item.    Track activity type, minutes you were active, and distance you reached each day in the "Food and Activity Tracker."   Utilize action plan to help stay motivated and complete questions on "To Do List."   Follow-Up Plan:  Attend session 17 next week.   Bring completed "Food and Activity Tracker" next session to be reviewed by Lifestyle Coach.

## 2018-01-12 ENCOUNTER — Ambulatory Visit: Payer: PPO

## 2018-01-12 DIAGNOSIS — M799 Soft tissue disorder, unspecified: Secondary | ICD-10-CM | POA: Diagnosis not present

## 2018-01-12 DIAGNOSIS — M25562 Pain in left knee: Secondary | ICD-10-CM | POA: Diagnosis not present

## 2018-01-12 DIAGNOSIS — I480 Paroxysmal atrial fibrillation: Secondary | ICD-10-CM

## 2018-01-12 DIAGNOSIS — J321 Chronic frontal sinusitis: Secondary | ICD-10-CM | POA: Diagnosis not present

## 2018-01-12 DIAGNOSIS — J320 Chronic maxillary sinusitis: Secondary | ICD-10-CM | POA: Diagnosis not present

## 2018-01-12 DIAGNOSIS — M25662 Stiffness of left knee, not elsewhere classified: Secondary | ICD-10-CM | POA: Diagnosis not present

## 2018-01-12 DIAGNOSIS — M62552 Muscle wasting and atrophy, not elsewhere classified, left thigh: Secondary | ICD-10-CM | POA: Diagnosis not present

## 2018-01-12 LAB — POCT INR: INR: 3.1 — AB (ref 2.0–3.0)

## 2018-01-12 NOTE — Patient Instructions (Signed)
Description   Start taking 2.5 tablets daily except 2 tablets on Sundays, Tuesdays, and Fridays.  Take your last dosage of Coumadin on Saturday 01/17/18 prior to procedure on 01/23/18.  Resume previous dosage of Coumadin post procedure if Urologist states ok to do so on 01/23/18.  Recheck in 7-10 days after procedure.  Call if placed on any new medications [phone]. Recheck INR in 2 weeks.

## 2018-01-13 DIAGNOSIS — C61 Malignant neoplasm of prostate: Secondary | ICD-10-CM | POA: Diagnosis not present

## 2018-01-13 DIAGNOSIS — R351 Nocturia: Secondary | ICD-10-CM | POA: Diagnosis not present

## 2018-01-14 DIAGNOSIS — M25562 Pain in left knee: Secondary | ICD-10-CM | POA: Diagnosis not present

## 2018-01-14 DIAGNOSIS — M25662 Stiffness of left knee, not elsewhere classified: Secondary | ICD-10-CM | POA: Diagnosis not present

## 2018-01-14 DIAGNOSIS — M62552 Muscle wasting and atrophy, not elsewhere classified, left thigh: Secondary | ICD-10-CM | POA: Diagnosis not present

## 2018-01-14 DIAGNOSIS — M799 Soft tissue disorder, unspecified: Secondary | ICD-10-CM | POA: Diagnosis not present

## 2018-01-15 ENCOUNTER — Telehealth: Payer: Self-pay | Admitting: *Deleted

## 2018-01-15 NOTE — Telephone Encounter (Signed)
Called patient to remind of lab appt. for 01-16-18 - arrival time - 8:45 am @ [facility] Admitting, lvm for a return call

## 2018-01-16 ENCOUNTER — Other Ambulatory Visit (HOSPITAL_COMMUNITY): Payer: PPO

## 2018-01-16 ENCOUNTER — Encounter: Payer: Self-pay | Admitting: Registered"

## 2018-01-16 ENCOUNTER — Encounter: Payer: PPO | Attending: Internal Medicine | Admitting: Registered"

## 2018-01-16 ENCOUNTER — Encounter (HOSPITAL_COMMUNITY)
Admission: RE | Admit: 2018-01-16 | Discharge: 2018-01-16 | Disposition: A | Discharge status: Home / Self Care | Payer: PPO | Source: Ambulatory Visit | Attending: Urology | Admitting: Urology

## 2018-01-16 DIAGNOSIS — Z01812 Encounter for preprocedural laboratory examination: Secondary | ICD-10-CM | POA: Insufficient documentation

## 2018-01-16 DIAGNOSIS — R7303 Prediabetes: Secondary | ICD-10-CM | POA: Diagnosis not present

## 2018-01-16 LAB — COMPREHENSIVE METABOLIC PANEL
ALK PHOS: 86 U/L (ref 38–126)
ALT: 16 U/L (ref 0–44)
AST: 19 U/L (ref 15–41)
Albumin: 3.8 g/dL (ref 3.5–5.0)
Anion gap: 8 (ref 5–15)
BUN: 24 mg/dL — ABNORMAL HIGH (ref 8–23)
CALCIUM: 8.9 mg/dL (ref 8.9–10.3)
CO2: 25 mmol/L (ref 22–32)
CREATININE: 0.94 mg/dL (ref 0.61–1.24)
Chloride: 102 mmol/L (ref 98–111)
Glucose, Bld: 85 mg/dL (ref 70–99)
Potassium: 4.4 mmol/L (ref 3.5–5.1)
Sodium: 135 mmol/L (ref 135–145)
Total Bilirubin: 0.4 mg/dL (ref 0.3–1.2)
Total Protein: 6.7 g/dL (ref 6.5–8.1)

## 2018-01-16 LAB — CBC
HCT: 44.5 % (ref 39.0–52.0)
HEMOGLOBIN: 14.6 g/dL (ref 13.0–17.0)
MCH: 33.6 pg (ref 26.0–34.0)
MCHC: 32.8 g/dL (ref 30.0–36.0)
MCV: 102.5 fL — ABNORMAL HIGH (ref 80.0–100.0)
Platelets: 264 10*3/uL (ref 150–400)
RBC: 4.34 MIL/uL (ref 4.22–5.81)
RDW: 13.2 % (ref 11.5–15.5)
WBC: 6.3 10*3/uL (ref 4.0–10.5)
nRBC: 0 % (ref 0.0–0.2)

## 2018-01-16 LAB — PROTIME-INR
INR: 2.78
PROTHROMBIN TIME: 29 s — AB (ref 11.4–15.2)

## 2018-01-16 LAB — APTT: aPTT: 41 seconds — ABNORMAL HIGH (ref 24–36)

## 2018-01-16 NOTE — Progress Notes (Signed)
Patient was seen on 01/16/18 for Session 17 of Diabetes Prevention Program course at [facility]. By the end of this session patients are able to complete the following objectives:   Learning Objectives:  Identify how to maintain and/or continue working toward program goals for the remainder of the program.   Describe ways that food and activity tracking can assist them in maintaining/reaching program goals.   Identify progress they have made since the beginning of the program.   Goals:   Record weight taken outside of class.   Track foods and beverages eaten each day in the "Food and Activity Tracker," including calories and fat grams for each item.    Track activity type, minutes you were active, and distance you reached each day in the "Food and Activity Tracker."   Follow-Up Plan:  Attend session 18 in three weeks.   Bring completed "Food and Activity Trackers" next session to be reviewed by Lifestyle Coach.

## 2018-01-18 NOTE — Progress Notes (Signed)
Office Visit Note   Patient: Jonathan Vargas           Date of Birth: November 19, 1945           MRN: [id number] Visit Date: 01/09/2018              Requested by: Leeroy Cha, MD [address] [place],  [id number] PCP: Leeroy Cha, MD   Assessment & Plan: Visit Diagnoses:  1. It band syndrome, left     Plan: Impression is left knee IT band syndrome.  Stretching exercises were demonstrated today.  Ice and heat and over-the-counter meds as needed.  Follow-up as needed.  Follow-Up Instructions: Return if symptoms worsen or fail to improve.   Orders:  No orders of the defined types were placed in this encounter.  No orders of the defined types were placed in this encounter.     Procedures: No procedures performed   Clinical Data: No additional findings.   Subjective: Chief Complaint  Patient presents with  . Left Knee - Follow-up    Jonathan Vargas comes in today for his 6-week follow-up for his left knee pain.  He is complaining of lateral left knee pain.  He is currently ambulating leaning with a cane.  He does endorse some occasional radiation proximally.  Denies any radicular symptoms.   Review of Systems  Constitutional: Negative.   All other systems reviewed and are negative.    Objective: Vital Signs: There were no vitals taken for this visit.  Physical Exam  Constitutional: He is oriented to person, place, and time. He appears well-developed and well-nourished.  Pulmonary/Chest: Effort normal.  Abdominal: Soft.  Neurological: He is alert and oriented to person, place, and time.  Skin: Skin is warm.  Psychiatric: He has a normal mood and affect. His behavior is normal. Judgment and thought content normal.  Nursing note and vitals reviewed.   Ortho Exam Left knee exam shows tenderness along the IT band over the lateral femoral condyle.  There is no crepitus.  Range of motion of the knee causes pain on the lateral side.  No  joint effusion.  Collaterals and cruciates are stable. Specialty Comments:  No specialty comments available.  Imaging: No results found.   PMFS History: Patient Active Problem List   Diagnosis Date Noted  . It band syndrome, left 01/09/2018  . Malignant neoplasm of prostate ([place]) 10/22/2017  . Chronic pain of left knee 09/30/2017  . Personal history of colonic polyps 09/26/2016  . Family history of colon cancer 09/26/2016  . Obesity 03/29/2015  . Tobacco use 03/29/2015  . 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 Depressive disorder, not elsewhere classified 12/13/2012  . Obstructive sleep apnea 12/13/2012  . Esophageal reflux 12/13/2012  . Impaired fasting glucose 12/13/2012  . Tobacco use disorder 12/13/2012  . 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 Gout, unspecified 12/13/2012  . Long term current use of anticoagulant therapy 12/13/2012  . Atrial fibrillation ([place]) 01/30/2011  . Bradycardia 01/30/2011  . Sleep apnea 01/30/2011  . Atrial flutter ([place]) 01/25/2011  . Essential hypertension, benign 01/25/2011  . Pure hypercholesterolemia 01/25/2011   Past Medical History:  Diagnosis Date  . Acid reflux   . Arthritis   . ASD (atrial septal defect)   . Asthma with allergic rhinitis and status asthmaticus   . Atrial fib/flutter, transient   . Cancer ([place])    skin  . Chronic anticoagulation   . Depression   . ENT complaint  Dr. Erik Obey  . Erectile dysfunction   . Fatty liver   . FHx: allergies   . Gout    , Possible, Right Knee x2 ; intermittent milder episodes of pain in hte first MTP - uric acid 7.7- no treatment so far.  . Hemochromatosis   . History of kidney stones   . Hypercholesteremia   . Hyperlipemia   . Hypertension   . Hypogonadism male   . Impaired fasting glucose   . 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  . Pneumonia    " walking PNA > 40 years ago"  . Prostate  cancer ([place])   . Seasonal allergies   . Sleep apnea    wears CPAP ( set at 4-11)  . Trigger finger, left    , fourth finger    Family History  Problem Relation Age of Onset  . Stroke Father   . Diabetes type II Father   . Colon cancer Father   . Heart disease Mother        with pacemaker  . Breast cancer Sister   . Diabetes type II Maternal Grandfather   . Leukemia Paternal Uncle   . Prostate cancer Other   . Pancreatic cancer Neg Hx     Past Surgical History:  Procedure Laterality Date  . CARDIOVERSION N/A 05/29/2016   Procedure: CARDIOVERSION;  Surgeon: [name], MD;  Location: [facility];  Service: Cardiovascular;  Laterality: N/A;  . CATARACT EXTRACTION W/ INTRAOCULAR LENS  IMPLANT, BILATERAL    . COLONOSCOPY W/ POLYPECTOMY    . COLONOSCOPY WITH PROPOFOL N/A 09/26/2016   Procedure: COLONOSCOPY WITH PROPOFOL;  Surgeon: Wilford Corner, MD;  Location: [facility];  Service: Endoscopy;  Laterality: N/A;  . ESOPHAGOGASTRODUODENOSCOPY (EGD) WITH PROPOFOL N/A 09/26/2016   Procedure: ESOPHAGOGASTRODUODENOSCOPY (EGD) WITH PROPOFOL;  Surgeon: Wilford Corner, MD;  Location: [facility];  Service: Endoscopy;  Laterality: N/A;  . LIVER BIOPSY    . NASAL SINUS SURGERY  1997   Social History   Occupational History  . Occupation: retired  Tobacco Use  . Smoking status: Current Every Day Smoker    Packs/day: 0.25    Years: 50.00    Pack years: 12.50    Types: Cigarettes    Start date: 10/30/2013  . Smokeless tobacco: Former Systems developer    Types: Chew  . Tobacco comment: trying to quit  Substance and Sexual Activity  . Alcohol use: Yes    Alcohol/week: 0.0 standard drinks    Comment: 2-3 glasses of red wine per night  . Drug use: No  . Sexual activity: Not Currently

## 2018-01-19 ENCOUNTER — Other Ambulatory Visit: Payer: Self-pay

## 2018-01-19 ENCOUNTER — Encounter (HOSPITAL_BASED_OUTPATIENT_CLINIC_OR_DEPARTMENT_OTHER): Payer: Self-pay | Admitting: *Deleted

## 2018-01-19 DIAGNOSIS — M62552 Muscle wasting and atrophy, not elsewhere classified, left thigh: Secondary | ICD-10-CM | POA: Diagnosis not present

## 2018-01-19 DIAGNOSIS — M25662 Stiffness of left knee, not elsewhere classified: Secondary | ICD-10-CM | POA: Diagnosis not present

## 2018-01-19 DIAGNOSIS — M799 Soft tissue disorder, unspecified: Secondary | ICD-10-CM | POA: Diagnosis not present

## 2018-01-19 DIAGNOSIS — M25562 Pain in left knee: Secondary | ICD-10-CM | POA: Diagnosis not present

## 2018-01-19 NOTE — Progress Notes (Addendum)
SPOKE WITH [place] NPO AFTER MIDNIGHT FOOD, CLEAR LIQUIDS FROM MIDNIGHT UNTIL 700 AM, THEN NPO MEDS TO TAKE: FLECANIDE, ALBUTEROL INHALER PRN AND BRING INHALER, BRING CPAP MASK TUBING AND MACHINE, FLEETS ENEMA AM OF SURGERY. CARDIAC CLEARANCE BHAVINKUM BHAGAT PA 01-09-18 CHART/EPIC EKG AND LOV LORI GERRHARDT 12-30-17 CHART/EPIC CBC, CMET, PT, PTT 01-16-18 CHART/EPIC, PT ELEVATED  CHEST XRAY 12-12-17 CHART/EPIC DRIVER FRIEND KYLE ADAMS HAS SURGERY ORDERS IN Epic NEEDS PT AM OF SURGERY.

## 2018-01-22 ENCOUNTER — Telehealth: Payer: Self-pay | Admitting: *Deleted

## 2018-01-22 DIAGNOSIS — E663 Overweight: Secondary | ICD-10-CM | POA: Diagnosis not present

## 2018-01-22 DIAGNOSIS — M150 Primary generalized (osteo)arthritis: Secondary | ICD-10-CM | POA: Diagnosis not present

## 2018-01-22 DIAGNOSIS — Z6829 Body mass index (BMI) 29.0-29.9, adult: Secondary | ICD-10-CM | POA: Diagnosis not present

## 2018-01-22 DIAGNOSIS — M1A09X0 Idiopathic chronic gout, multiple sites, without tophus (tophi): Secondary | ICD-10-CM | POA: Diagnosis not present

## 2018-01-22 DIAGNOSIS — Z79899 Other long term (current) drug therapy: Secondary | ICD-10-CM | POA: Diagnosis not present

## 2018-01-22 NOTE — Telephone Encounter (Signed)
Called patient to remind of procedure for 01-23-18, lvm for a return call

## 2018-01-23 ENCOUNTER — Ambulatory Visit (HOSPITAL_BASED_OUTPATIENT_CLINIC_OR_DEPARTMENT_OTHER): Payer: PPO | Admitting: Anesthesiology

## 2018-01-23 ENCOUNTER — Encounter (HOSPITAL_BASED_OUTPATIENT_CLINIC_OR_DEPARTMENT_OTHER)
Admission: RE | Disposition: A | Discharge status: Home / Self Care | Payer: Self-pay | Source: Ambulatory Visit | Attending: Urology

## 2018-01-23 ENCOUNTER — Ambulatory Visit (HOSPITAL_COMMUNITY): Payer: PPO

## 2018-01-23 ENCOUNTER — Encounter (HOSPITAL_BASED_OUTPATIENT_CLINIC_OR_DEPARTMENT_OTHER): Payer: Self-pay | Admitting: *Deleted

## 2018-01-23 ENCOUNTER — Ambulatory Visit (HOSPITAL_BASED_OUTPATIENT_CLINIC_OR_DEPARTMENT_OTHER)
Admission: RE | Admit: 2018-01-23 | Discharge: 2018-01-23 | Disposition: A | Discharge status: Home / Self Care | Payer: PPO | Source: Ambulatory Visit | Attending: Urology | Admitting: Urology

## 2018-01-23 DIAGNOSIS — I739 Peripheral vascular disease, unspecified: Secondary | ICD-10-CM | POA: Insufficient documentation

## 2018-01-23 DIAGNOSIS — Z85828 Personal history of other malignant neoplasm of skin: Secondary | ICD-10-CM | POA: Diagnosis not present

## 2018-01-23 DIAGNOSIS — Z9989 Dependence on other enabling machines and devices: Secondary | ICD-10-CM | POA: Diagnosis not present

## 2018-01-23 DIAGNOSIS — C61 Malignant neoplasm of prostate: Secondary | ICD-10-CM | POA: Diagnosis not present

## 2018-01-23 DIAGNOSIS — G4730 Sleep apnea, unspecified: Secondary | ICD-10-CM | POA: Diagnosis not present

## 2018-01-23 DIAGNOSIS — I10 Essential (primary) hypertension: Secondary | ICD-10-CM | POA: Insufficient documentation

## 2018-01-23 DIAGNOSIS — F17210 Nicotine dependence, cigarettes, uncomplicated: Secondary | ICD-10-CM | POA: Insufficient documentation

## 2018-01-23 HISTORY — DX: Cardiac arrhythmia, unspecified: I49.9

## 2018-01-23 HISTORY — PX: CYSTOSCOPY: SHX5120

## 2018-01-23 HISTORY — PX: SPACE OAR INSTILLATION: SHX6769

## 2018-01-23 HISTORY — PX: RADIOACTIVE SEED IMPLANT: SHX5150

## 2018-01-23 LAB — PROTIME-INR
INR: 1.13
Prothrombin Time: 14.4 seconds (ref 11.4–15.2)

## 2018-01-23 SURGERY — INSERTION, RADIATION SOURCE, PROSTATE
Anesthesia: General | Site: Rectum

## 2018-01-23 MED ORDER — ONDANSETRON HCL 4 MG/2ML IJ SOLN
INTRAMUSCULAR | Status: DC | PRN
  Administered 2018-01-23: 4 mg via INTRAVENOUS

## 2018-01-23 MED ORDER — PHENYLEPHRINE 40 MCG/ML (10ML) SYRINGE FOR IV PUSH (FOR BLOOD PRESSURE SUPPORT)
PREFILLED_SYRINGE | INTRAVENOUS | Status: AC
  Filled 2018-01-23: qty 10

## 2018-01-23 MED ORDER — SUCCINYLCHOLINE CHLORIDE 200 MG/10ML IV SOSY
PREFILLED_SYRINGE | INTRAVENOUS | Status: AC
  Filled 2018-01-23: qty 10

## 2018-01-23 MED ORDER — EPHEDRINE 5 MG/ML INJ
INTRAVENOUS | Status: AC
  Filled 2018-01-23: qty 20

## 2018-01-23 MED ORDER — FENTANYL CITRATE (PF) 100 MCG/2ML IJ SOLN
25.0000 ug | INTRAMUSCULAR | Status: DC | PRN
  Filled 2018-01-23: qty 1

## 2018-01-23 MED ORDER — CIPROFLOXACIN IN D5W 400 MG/200ML IV SOLN
INTRAVENOUS | Status: AC
  Filled 2018-01-23: qty 200

## 2018-01-23 MED ORDER — GLYCOPYRROLATE 0.2 MG/ML IJ SOLN
INTRAMUSCULAR | Status: DC | PRN
  Administered 2018-01-23: 0.2 mg via INTRAVENOUS

## 2018-01-23 MED ORDER — ONDANSETRON HCL 4 MG/2ML IJ SOLN
INTRAMUSCULAR | Status: AC
  Filled 2018-01-23: qty 2

## 2018-01-23 MED ORDER — MIDAZOLAM HCL 2 MG/2ML IJ SOLN
INTRAMUSCULAR | Status: AC
  Filled 2018-01-23: qty 2

## 2018-01-23 MED ORDER — LIDOCAINE HCL (CARDIAC) PF 100 MG/5ML IV SOSY
PREFILLED_SYRINGE | INTRAVENOUS | Status: DC | PRN
  Administered 2018-01-23: 80 mg via INTRAVENOUS

## 2018-01-23 MED ORDER — SUCCINYLCHOLINE 20MG/ML (10ML) SYRINGE FOR MEDFUSION PUMP - OPTIME
INTRAMUSCULAR | Status: DC | PRN
  Administered 2018-01-23: 200 mg via INTRAVENOUS

## 2018-01-23 MED ORDER — DEXAMETHASONE SODIUM PHOSPHATE 10 MG/ML IJ SOLN
INTRAMUSCULAR | Status: AC
  Filled 2018-01-23: qty 1

## 2018-01-23 MED ORDER — PROPOFOL 10 MG/ML IV BOLUS
INTRAVENOUS | Status: DC | PRN
  Administered 2018-01-23: 110 mg via INTRAVENOUS

## 2018-01-23 MED ORDER — PHENYLEPHRINE HCL 10 MG/ML IJ SOLN
INTRAMUSCULAR | Status: DC | PRN
  Administered 2018-01-23 (×3): 80 ug via INTRAVENOUS

## 2018-01-23 MED ORDER — PROPOFOL 10 MG/ML IV BOLUS
INTRAVENOUS | Status: AC
  Filled 2018-01-23: qty 20

## 2018-01-23 MED ORDER — ACETAMINOPHEN 10 MG/ML IV SOLN
1000.0000 mg | Freq: Once | INTRAVENOUS | Status: DC | PRN
  Filled 2018-01-23: qty 100

## 2018-01-23 MED ORDER — FLEET ENEMA 7-19 GM/118ML RE ENEM
1.0000 | ENEMA | Freq: Once | RECTAL | Status: DC
  Filled 2018-01-23: qty 1

## 2018-01-23 MED ORDER — TRAMADOL HCL 50 MG PO TABS
ORAL_TABLET | ORAL | Status: AC
  Filled 2018-01-23: qty 1

## 2018-01-23 MED ORDER — DEXAMETHASONE SODIUM PHOSPHATE 10 MG/ML IJ SOLN
INTRAMUSCULAR | Status: DC | PRN
  Administered 2018-01-23: 4 mg via INTRAVENOUS

## 2018-01-23 MED ORDER — TRAMADOL HCL 50 MG PO TABS
50.0000 mg | ORAL_TABLET | Freq: Four times a day (QID) | ORAL | Status: DC | PRN
  Administered 2018-01-23: 50 mg via ORAL
  Filled 2018-01-23: qty 1

## 2018-01-23 MED ORDER — SODIUM CHLORIDE 0.9 % IV SOLN
INTRAVENOUS | Status: AC | PRN
  Administered 2018-01-23: 1000 mL via INTRAMUSCULAR

## 2018-01-23 MED ORDER — LIDOCAINE 2% (20 MG/ML) 5 ML SYRINGE
INTRAMUSCULAR | Status: AC
  Filled 2018-01-23: qty 5

## 2018-01-23 MED ORDER — IOHEXOL 300 MG/ML  SOLN
INTRAMUSCULAR | Status: DC | PRN
  Administered 2018-01-23: 7 mL via INTRAVENOUS

## 2018-01-23 MED ORDER — MIDAZOLAM HCL 5 MG/5ML IJ SOLN
INTRAMUSCULAR | Status: DC | PRN
  Administered 2018-01-23: 2 mg via INTRAVENOUS

## 2018-01-23 MED ORDER — FENTANYL CITRATE (PF) 100 MCG/2ML IJ SOLN
INTRAMUSCULAR | Status: DC | PRN
  Administered 2018-01-23 (×2): 100 ug via INTRAVENOUS

## 2018-01-23 MED ORDER — TRAMADOL HCL 50 MG PO TABS: 50.0000 mg | ORAL_TABLET | Freq: Four times a day (QID) | ORAL | 0 refills | Status: DC | PRN

## 2018-01-23 MED ORDER — FENTANYL CITRATE (PF) 100 MCG/2ML IJ SOLN
INTRAMUSCULAR | Status: AC
  Filled 2018-01-23: qty 2

## 2018-01-23 MED ORDER — LACTATED RINGERS IV SOLN
INTRAVENOUS | Status: DC
  Administered 2018-01-23: 12:00:00 via INTRAVENOUS
  Filled 2018-01-23: qty 1000

## 2018-01-23 MED ORDER — EPHEDRINE SULFATE 50 MG/ML IJ SOLN
INTRAMUSCULAR | Status: DC | PRN
  Administered 2018-01-23 (×3): 10 mg via INTRAVENOUS

## 2018-01-23 MED ORDER — SODIUM CHLORIDE (PF) 0.9 % IJ SOLN
INTRAMUSCULAR | Status: DC | PRN
  Administered 2018-01-23: 10 mL via INTRAVENOUS

## 2018-01-23 MED ORDER — CIPROFLOXACIN IN D5W 400 MG/200ML IV SOLN
400.0000 mg | INTRAVENOUS | Status: AC
  Administered 2018-01-23: 400 mg via INTRAVENOUS
  Filled 2018-01-23: qty 200

## 2018-01-23 MED ORDER — LIDOCAINE HCL 1 % IJ SOLN
5.0000 mL | Freq: Once | INTRAMUSCULAR | Status: DC
  Filled 2018-01-23: qty 5

## 2018-01-23 SURGICAL SUPPLY — 45 items
BAG URINE DRAINAGE (UROLOGICAL SUPPLIES) ×4 IMPLANT
BLADE CLIPPER SURG (BLADE) ×4 IMPLANT
CATH FOLEY 2WAY SLVR  5CC 16FR (CATHETERS) ×1
CATH FOLEY 2WAY SLVR 5CC 16FR (CATHETERS) ×3 IMPLANT
CATH ROBINSON RED A/P 16FR (CATHETERS) IMPLANT
CATH ROBINSON RED A/P 20FR (CATHETERS) ×4 IMPLANT
CLOTH BEACON ORANGE TIMEOUT ST (SAFETY) ×4 IMPLANT
CONT SPECI 4OZ STER CLIK (MISCELLANEOUS) ×8 IMPLANT
COVER BACK TABLE 60X90IN (DRAPES) ×4 IMPLANT
COVER MAYO STAND STRL (DRAPES) ×4 IMPLANT
COVER WAND RF STERILE (DRAPES) ×4 IMPLANT
DRSG TEGADERM 4X4.75 (GAUZE/BANDAGES/DRESSINGS) ×4 IMPLANT
DRSG TEGADERM 8X12 (GAUZE/BANDAGES/DRESSINGS) ×4 IMPLANT
GAUZE SPONGE 4X4 12PLY STRL (GAUZE/BANDAGES/DRESSINGS) ×4 IMPLANT
GLOVE BIO SURGEON STRL SZ 6 (GLOVE) IMPLANT
GLOVE BIO SURGEON STRL SZ 6.5 (GLOVE) IMPLANT
GLOVE BIO SURGEON STRL SZ7 (GLOVE) IMPLANT
GLOVE BIO SURGEON STRL SZ8 (GLOVE) ×8 IMPLANT
GLOVE BIOGEL PI IND STRL 6 (GLOVE) IMPLANT
GLOVE BIOGEL PI IND STRL 6.5 (GLOVE) IMPLANT
GLOVE BIOGEL PI IND STRL 7.0 (GLOVE) ×3 IMPLANT
GLOVE BIOGEL PI IND STRL 8 (GLOVE) IMPLANT
GLOVE BIOGEL PI IND STRL 8.5 (GLOVE) ×3 IMPLANT
GLOVE BIOGEL PI INDICATOR 6 (GLOVE)
GLOVE BIOGEL PI INDICATOR 6.5 (GLOVE)
GLOVE BIOGEL PI INDICATOR 7.0 (GLOVE) ×1
GLOVE BIOGEL PI INDICATOR 8 (GLOVE)
GLOVE BIOGEL PI INDICATOR 8.5 (GLOVE) ×1
GLOVE ECLIPSE 8.0 STRL XLNG CF (GLOVE) ×16 IMPLANT
GLOVE INDICATOR 8.5 STRL (GLOVE) ×4 IMPLANT
GOWN STRL REUS W/ TWL XL LVL3 (GOWN DISPOSABLE) ×3 IMPLANT
GOWN STRL REUS W/TWL XL LVL3 (GOWN DISPOSABLE) ×5 IMPLANT
HOLDER FOLEY CATH W/STRAP (MISCELLANEOUS) ×4 IMPLANT
I-Seed AGX100 ×276 IMPLANT
IMPL SPACEOAR SYSTEM 10ML (Spacer) ×3 IMPLANT
IMPLANT SPACEOAR SYSTEM 10ML (Spacer) ×4 IMPLANT
IV NS 1000ML (IV SOLUTION) ×1
IV NS 1000ML BAXH (IV SOLUTION) ×3 IMPLANT
KIT TURNOVER CYSTO (KITS) ×4 IMPLANT
MARKER SKIN DUAL TIP RULER LAB (MISCELLANEOUS) ×4 IMPLANT
PACK CYSTO (CUSTOM PROCEDURE TRAY) ×4 IMPLANT
SUT BONE WAX W31G (SUTURE) IMPLANT
SYR 10ML LL (SYRINGE) ×4 IMPLANT
UNDERPAD 30X30 (UNDERPADS AND DIAPERS) ×8 IMPLANT
WATER STERILE IRR 500ML POUR (IV SOLUTION) ×4 IMPLANT

## 2018-01-23 NOTE — Anesthesia Postprocedure Evaluation (Signed)
Anesthesia Post Note  Patient: Jonathan Vargas  Procedure(s) Performed: RADIOACTIVE SEED IMPLANT/BRACHYTHERAPY IMPLANT (N/A Prostate) SPACE OAR INSTILLATION (N/A Rectum) CYSTOSCOPY FLEXIBLE (Bladder)     Patient location during evaluation: PACU Anesthesia Type: General Level of consciousness: awake and alert Pain management: pain level controlled Vital Signs Assessment: post-procedure vital signs reviewed and stable Respiratory status: spontaneous breathing, nonlabored ventilation, respiratory function stable and patient connected to nasal cannula oxygen Cardiovascular status: blood pressure returned to baseline and stable Postop Assessment: no apparent nausea or vomiting Anesthetic complications: no    Last Vitals:  Vitals:   01/23/18 1500 01/23/18 1515  BP: 140/73 135/73  Pulse: 65 65  Resp: 15 16  Temp:    SpO2: 96% 94%    Last Pain:  Vitals:   01/23/18 1500  TempSrc:   PainSc: 4                   L 

## 2018-01-23 NOTE — H&P (Signed)
H&P  Chief Complaint:  Prostate cancer History of Present Illness:  72 year old male presents at this time for I 125 brachytherapy and placement of Space OAR.  Past Medical History:  Diagnosis Date  . Acid reflux   . Arthritis   . ASD (atrial septal defect)   . Asthma with allergic rhinitis and status asthmaticus    NO RECENT FLARE UPS  . Atrial fib/flutter, transient   . Cancer ([facility])    skin  . Chronic anticoagulation   . Depression   . Dysrhythmia    ATRIAL FIB  . ENT complaint    Dr. Erik Obey  . Erectile dysfunction   . Fatty liver   . FHx: allergies   . Gout    , Possible, Right Knee x2 ; intermittent milder episodes of pain in hte first MTP - uric acid 7.7- no treatment so far.  . Hemochromatosis    DOES TWICE YEAR  . History of kidney stones    SMALL IN RIGHT KIDNEY  . Hypercholesteremia   . Hyperlipemia    PT DENIES  . Hypertension   . Hypogonadism male   . Impaired fasting glucose   . 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  . Pneumonia    " walking PNA > 40 years ago"  . Prostate cancer ([facility]) [place] 2018  . Seasonal allergies   . Sleep apnea    wears CPAP ( set at 4-11)  . Trigger finger, left    , fourth finger    Past Surgical History:  Procedure Laterality Date  . CARDIOVERSION N/A 05/29/2016   Procedure: CARDIOVERSION;  Surgeon: [name], MD;  Location: [facility];  Service: Cardiovascular;  Laterality: N/A;  . CATARACT EXTRACTION W/ INTRAOCULAR LENS  IMPLANT, BILATERAL  2016  . COLONOSCOPY WITH PROPOFOL N/A 09/26/2016   Procedure: COLONOSCOPY WITH PROPOFOL;  Surgeon: Wilford Corner, MD;  Location: [facility];  Service: Endoscopy;  Laterality: N/A;  . ESOPHAGOGASTRODUODENOSCOPY (EGD) WITH PROPOFOL N/A 09/26/2016   Procedure: ESOPHAGOGASTRODUODENOSCOPY (EGD) WITH PROPOFOL;  Surgeon: Wilford Corner, MD;  Location: [facility];  Service: Endoscopy;  Laterality: N/A;  .  LIVER BIOPSY  MANY YRS AGO  . NASAL SINUS SURGERY  1997    Home Medications:    Allergies:  Allergies  Allergen Reactions  . Clindamycin/Lincomycin Diarrhea and Other (See Comments)    Stomach pain   . Ciprofloxacin Other (See Comments)    Joint pain  . Amoxicillin Rash    Family History  Problem Relation Age of Onset  . Stroke Father   . Diabetes type II Father   . Colon cancer Father   . Heart disease Mother        with pacemaker  . Breast cancer Sister   . Diabetes type II Maternal Grandfather   . Leukemia Paternal Uncle   . Prostate cancer Other   . Pancreatic cancer Neg Hx     Social History:  reports that he has been smoking cigarettes. He started smoking about 4 years ago. He has a 12.50 pack-year smoking history. He has quit using smokeless tobacco.  His smokeless tobacco use included chew. He reports that he drinks alcohol. He reports that he does not use drugs.  ROS: A complete review of systems was performed.  All systems are negative except for pertinent findings as noted.  Physical Exam:  Vital signs in last 24 hours: Temp:  [97.7 F (36.5 C)] 97.7 F (36.5 C) (11/08 1105) Pulse  Rate:  [50] 50 (11/08 1105) Resp:  [16] 16 (11/08 1105) BP: (166)/(83) 166/83 (11/08 1105) SpO2:  [100 %] 100 % (11/08 1105) Weight:  [97.1 kg] 97.1 kg (11/08 1105) Constitutional:  Alert and oriented, No acute distress Cardiovascular: Regular rate  Respiratory: Normal respiratory effort GI: Abdomen is soft, nontender, nondistended, no abdominal masses. No CVAT.  Genitourinary: Normal male phallus, testes are descended bilaterally and non-tender and without masses, scrotum is normal in appearance without lesions or masses, perineum is normal on inspection. Lymphatic: No lymphadenopathy Neurologic: Grossly intact, no focal deficits Psychiatric: Normal mood and affect  Laboratory Data:  No results for input(s): WBC, HGB, HCT, PLT in the last 72 hours.  No results for  input(s): NA, K, CL, GLUCOSE, BUN, CALCIUM, CREATININE in the last 72 hours.  Invalid input(s): CO3   Results for orders placed or performed during the hospital encounter of 01/23/18 (from the past 24 hour(s))  PT- INR Day of Surgery     Status: None   Collection Time: 01/23/18 12:05 PM  Result Value Ref Range   Prothrombin Time 14.4 11.4 - 15.2 seconds   INR 1.13    No results found for this or any previous visit (from the past 240 hour(s)).  Renal Function: No results for input(s): CREATININE in the last 168 hours. Estimated Creatinine Clearance: 81.4 mL/min (by C-G formula based on SCr of 0.94 mg/dL).  Radiologic Imaging: No results found.  Impression/Assessment:    Low/intermediate risk prostate cancer  Plan:   I 125 brachytherapy, placement of Space OAR

## 2018-01-23 NOTE — Op Note (Signed)
Preoperative diagnosis: Clinical stage TI C adenocarcinoma the prostate   Postoperative diagnosis: Same   Procedure: I-125 prostate seed implantation, flexible cystoscopy  Surgeon: Lillette Boxer. Murna Backer M.D.  Radiation Oncologist: Tyler Pita, M.D.  Anesthesia: Gen.   Indications: Patient  was diagnosed with clinical stage TI C prostate cancer. We had extensive discussion with him about treatment options versus. He elected to proceed with seed implantation. He underwent consultation my office as well as with Dr. Tammi Klippel. He appeared to understand the advantages disadvantages potential risks of this treatment option. Full informed consent has been obtained.   Technique and findings: Patient was brought the operating room where he had successful induction of general anesthesia. He was placed in dorso-lithotomy position and prepped and draped in usual manner. Appropriate surgical timeout was performed. Radiation oncology department placed a transrectal ultrasound probe anchoring stand. Foley catheter with contrast in the balloon was inserted without difficulty. Anchoring needles were placed within the prostate. Rectal tube was placed. Real-time contouring of the urethra prostate and rectum were performed and the dosing parameters were established. Targeted dose was 145 gray.  I was then called  to the operating suite suite for placement of the needles. A second timeout was performed. All needle passage was done with real-time transrectal ultrasound guidance with the sagittal plane. A total of 29 needles were placed.  69 active seeds were implanted.  I then proceeded with placement of SpaceOAR by introducing a needle with the bevel angled inferiorly approximately 2 cm superior to the anus. This was angled downward and under direct ultrasound was placed within the space between the prostatic capsule and rectum. This was confirmed with a small amount of sterile saline injected and this was performed  under direct ultrasound. I then attached the SpaceOAR to the needle and injected this in the space between the prostate and rectum with good placement noted. The Foley catheter was removed and flexible cystoscopy failed to show any seeds outside the prostate.  The patient was brought to recovery room in stable condition, having tolerated the procedure well.Marland Kitchen

## 2018-01-23 NOTE — Anesthesia Procedure Notes (Signed)
Procedure Name: Intubation Date/Time: 01/23/2018 1:12 PM Performed by: Jonna Munro, CRNA Pre-anesthesia Checklist: Patient identified, Emergency Drugs available, Suction available, Patient being monitored and Timeout performed Patient Re-evaluated:Patient Re-evaluated prior to induction Oxygen Delivery Method: Circle system utilized Preoxygenation: Pre-oxygenation with 100% oxygen Induction Type: IV induction Laryngoscope Size: Mac and 3 Grade View: Grade I Tube type: Oral Tube size: 7.0 mm Number of attempts: 1 Airway Equipment and Method: Stylet Placement Confirmation: ETT inserted through vocal cords under direct vision,  positive ETCO2 and breath sounds checked- equal and bilateral Secured at: 24 cm Tube secured with: Tape Dental Injury: Teeth and Oropharynx as per pre-operative assessment

## 2018-01-23 NOTE — Anesthesia Preprocedure Evaluation (Addendum)
Anesthesia Evaluation  Patient identified by MRN, date of birth, ID band Patient awake    Reviewed: Allergy & Precautions, NPO status , Patient's Chart, lab work & pertinent test results  Airway Mallampati: I  TM Distance: >3 FB Neck ROM: Full    Dental no notable dental hx. (+) Teeth Intact, Dental Advisory Given   Pulmonary asthma , sleep apnea and Continuous Positive Airway Pressure Ventilation , Current Smoker,    Pulmonary exam normal breath sounds clear to auscultation       Cardiovascular hypertension, Pt. on medications + Peripheral Vascular Disease  Normal cardiovascular exam+ dysrhythmias (on coumadin) Atrial Fibrillation  Rhythm:Regular Rate:Normal  Stress Test 12/2017  The stress test is normal. PLAN:  - Continue current medications and follow up as planned.  - He may proceed with his planned surgery at acceptable risk.  TTE 2018 EF 55-60%, no significant valvular abnormalities   Neuro/Psych PSYCHIATRIC DISORDERS Depression negative neurological ROS     GI/Hepatic Neg liver ROS, GERD  ,Hemochromatosis   Endo/Other  negative endocrine ROS  Renal/GU negative Renal ROS  negative genitourinary   Musculoskeletal negative musculoskeletal ROS (+)   Abdominal   Peds  Hematology negative hematology ROS (+)   Anesthesia Other Findings Prostate CA  Reproductive/Obstetrics                           Anesthesia Physical Anesthesia Plan  ASA: III  Anesthesia Plan: General   Post-op Pain Management:    Induction: Intravenous  PONV Risk Score and Plan: 1 and Ondansetron and Dexamethasone  Airway Management Planned: Oral ETT  Additional Equipment:   Intra-op Plan:   Post-operative Plan: Extubation in OR  Informed Consent: I have reviewed the patients History and Physical, chart, labs and discussed the procedure including the risks, benefits and alternatives for the proposed  anesthesia with the patient or authorized representative who has indicated his/her understanding and acceptance.   Dental advisory given  Plan Discussed with: CRNA  Anesthesia Plan Comments:        Anesthesia Quick Evaluation

## 2018-01-23 NOTE — Interval H&P Note (Signed)
History and Physical Interval Note:  01/23/2018 12:44 PM  [place]  has presented today for surgery, with the diagnosis of PROSTATE CANCER  The various methods of treatment have been discussed with the patient and family. After consideration of risks, benefits and other options for treatment, the patient has consented to  Procedure(s): RADIOACTIVE SEED IMPLANT/BRACHYTHERAPY IMPLANT (N/A) SPACE OAR INSTILLATION (N/A) as a surgical intervention .  The patient's history has been reviewed, patient examined, no change in status, stable for surgery.  I have reviewed the patient's chart and labs.  Questions were answered to the patient's satisfaction.     Jonathan Vargas Jonathan Vargas

## 2018-01-23 NOTE — Transfer of Care (Signed)
Immediate Anesthesia Transfer of Care Note  Patient: Jonathan Vargas  Procedure(s) Performed: RADIOACTIVE SEED IMPLANT/BRACHYTHERAPY IMPLANT (N/A Prostate) SPACE OAR INSTILLATION (N/A Rectum) CYSTOSCOPY FLEXIBLE (Bladder)  Patient Location: PACU  Anesthesia Type:General  Level of Consciousness: awake, alert  and oriented  Airway & Oxygen Therapy: Patient Spontanous Breathing and Patient connected to nasal cannula oxygen  Post-op Assessment: Report given to RN and Post -op Vital signs reviewed and stable  Post vital signs: Reviewed and stable  Last Vitals:  Vitals Value Taken Time  BP    Temp    Pulse    Resp    SpO2      Last Pain:  Vitals:   01/23/18 1105  TempSrc: Oral         Complications: No apparent anesthesia complications

## 2018-01-23 NOTE — Discharge Instructions (Signed)
Radioactive Seed Implant Home Care Instructions   Activity:    Rest for the remainder of the day.  Do not drive or operate equipment today.  You may resume normal  activities in a few days as instructed by your physician, without risk of harmful radiation exposure to those around you, provided you follow the time and distance precautions on the Radiation Oncology Instruction Sheet.   Meals: Drink plenty of lipuids and eat light foods, such as gelatin or soup this evening .  You may return to normal meal plan tomorrow.  Return To Work: You may return to work as instructed by your physician.  Special Instruction:   If any seeds are found, use tweezers to pick up seeds and place in a glass container of any kind and bring to your physician's office.  Call your physician if any of these symptoms occur:   Persistent or heavy bleeding  Urine stream diminishes or stops completely after catheter is removed  Fever equal to or greater than 101 degrees F  Cloudy urine with a strong foul odor  Severe pain  You may feel some burning pain and/or hesitancy when you urinate after the catheter is removed.  These symptoms may increase over the next few weeks, but should diminish within forur to six weeks.  Applying moist heat to the lower abdomen or a hot tub bath may help relieve the pain.  If the discomfort becomes severe, please call your physician for additional medications.     Post Anesthesia Home Care Instructions  Activity: Get plenty of rest for the remainder of the day. A responsible individual must stay with you for 24 hours following the procedure.  For the next 24 hours, DO NOT: -Drive a car -Operate machinery -Drink alcoholic beverages -Take any medication unless instructed by your physician -Make any legal decisions or sign important papers.  Meals: Start with liquid foods such as gelatin or soup. Progress to regular foods as tolerated. Avoid greasy, spicy, heavy foods. If  nausea and/or vomiting occur, drink only clear liquids until the nausea and/or vomiting subsides. Call your physician if vomiting continues.  Special Instructions/Symptoms: Your throat may feel dry or sore from the anesthesia or the breathing tube placed in your throat during surgery. If this causes discomfort, gargle with warm salt water. The discomfort should disappear within 24 hours.  If you had a scopolamine patch placed behind your ear for the management of post- operative nausea and/or vomiting:  1. The medication in the patch is effective for 72 hours, after which it should be removed.  Wrap patch in a tissue and discard in the trash. Wash hands thoroughly with soap and water. 2. You may remove the patch earlier than 72 hours if you experience unpleasant side effects which may include dry mouth, dizziness or visual disturbances. 3. Avoid touching the patch. Wash your hands with soap and water after contact with the patch.     

## 2018-01-25 NOTE — Progress Notes (Signed)
  Radiation Oncology         [phone] ________________________________  Name: Jonathan Vargas MRN: [id number]  Date: 01/25/2018  DOB: 10/24/1945       Prostate Seed Implant  RD:[id number], Ronie Spies, MD  No ref. provider found  DIAGNOSIS: 72 y.o. gentleman with Stage T1c adenocarcinoma of the prostate with Gleason Score of 3+4, and PSA of 5.94  PROCEDURE: Insertion of radioactive I-125 seeds into the prostate gland.  RADIATION DOSE: 145 Gy, definitive therapy.  TECHNIQUE: Shogo Larkey Arey was brought to the operating room with the urologist. He was placed in the dorsolithotomy position. He was catheterized and a rectal tube was inserted. The perineum was shaved, prepped and draped. The ultrasound probe was then introduced into the rectum to see the prostate gland.  TREATMENT DEVICE: A needle grid was attached to the ultrasound probe stand and anchor needles were placed.  3D PLANNING: The prostate was imaged in 3D using a sagittal sweep of the prostate probe. These images were transferred to the planning computer. There, the prostate, urethra and rectum were defined on each axial reconstructed image. Then, the software created an optimized 3D plan and a few seed positions were adjusted. The quality of the plan was reviewed using [facility] information for the target and the following two organs at risk:  Urethra and Rectum.  Then the accepted plan was printed and handed off to the radiation therapist.  Under my supervision, the custom loading of the seeds and spacers was carried out and loaded into sealed vicryl sleeves.  These pre-loaded needles were then placed into the needle holder.Marland Kitchen  PROSTATE VOLUME STUDY:  Using transrectal ultrasound the volume of the prostate was verified to be 41.4 cc.  SPECIAL TREATMENT PROCEDURE/SUPERVISION AND HANDLING: The pre-loaded needles were then delivered under sagittal guidance. A total of 29 needles were used to deposit 69 seeds in the prostate gland. The  individual seed activity was 0.445 mCi.  SpaceOAR:  Yes  COMPLEX SIMULATION: At the end of the procedure, an anterior radiograph of the pelvis was obtained to document seed positioning and count. Cystoscopy was performed to check the urethra and bladder.  MICRODOSIMETRY: At the end of the procedure, the patient was emitting 0.12 mR/hr at 1 meter. Accordingly, he was considered safe for hospital discharge.  PLAN: The patient will return to the radiation oncology clinic for post implant CT dosimetry in three weeks.   ________________________________  Sheral Apley Tammi Klippel, M.D.

## 2018-01-26 ENCOUNTER — Encounter (HOSPITAL_BASED_OUTPATIENT_CLINIC_OR_DEPARTMENT_OTHER): Payer: Self-pay | Admitting: Urology

## 2018-02-02 ENCOUNTER — Ambulatory Visit: Payer: PPO

## 2018-02-02 DIAGNOSIS — I480 Paroxysmal atrial fibrillation: Secondary | ICD-10-CM | POA: Diagnosis not present

## 2018-02-02 LAB — POCT INR: INR: 2.2 (ref 2.0–3.0)

## 2018-02-02 NOTE — Patient Instructions (Signed)
Description   Continue on same dosage 2.5 tablets daily except 2 tablets on Sundays, Tuesdays, and Fridays.  Recheck in 3 weeks.  Call if placed on any new medications [phone].

## 2018-02-03 DIAGNOSIS — M25562 Pain in left knee: Secondary | ICD-10-CM | POA: Diagnosis not present

## 2018-02-03 DIAGNOSIS — M799 Soft tissue disorder, unspecified: Secondary | ICD-10-CM | POA: Diagnosis not present

## 2018-02-03 DIAGNOSIS — M25662 Stiffness of left knee, not elsewhere classified: Secondary | ICD-10-CM | POA: Diagnosis not present

## 2018-02-03 DIAGNOSIS — M62552 Muscle wasting and atrophy, not elsewhere classified, left thigh: Secondary | ICD-10-CM | POA: Diagnosis not present

## 2018-02-04 DIAGNOSIS — M62552 Muscle wasting and atrophy, not elsewhere classified, left thigh: Secondary | ICD-10-CM | POA: Diagnosis not present

## 2018-02-04 DIAGNOSIS — M25562 Pain in left knee: Secondary | ICD-10-CM | POA: Diagnosis not present

## 2018-02-04 DIAGNOSIS — M25662 Stiffness of left knee, not elsewhere classified: Secondary | ICD-10-CM | POA: Diagnosis not present

## 2018-02-04 DIAGNOSIS — M799 Soft tissue disorder, unspecified: Secondary | ICD-10-CM | POA: Diagnosis not present

## 2018-02-05 ENCOUNTER — Ambulatory Visit: Payer: PPO

## 2018-02-05 ENCOUNTER — Ambulatory Visit (HOSPITAL_COMMUNITY): Payer: PPO

## 2018-02-05 DIAGNOSIS — C61 Malignant neoplasm of prostate: Secondary | ICD-10-CM | POA: Insufficient documentation

## 2018-02-06 ENCOUNTER — Encounter (HOSPITAL_BASED_OUTPATIENT_CLINIC_OR_DEPARTMENT_OTHER): Payer: PPO | Admitting: Registered"

## 2018-02-06 ENCOUNTER — Encounter: Payer: Self-pay | Admitting: Registered"

## 2018-02-06 DIAGNOSIS — R7303 Prediabetes: Secondary | ICD-10-CM | POA: Diagnosis not present

## 2018-02-06 NOTE — Progress Notes (Signed)
Patient was seen on 02/06/18 for Session 18 of Diabetes Prevention Program course at [facility]. By the end of this session patients are able to complete the following objectives:   Learning Objectives:  Explain how glucose is used in the body and it's relationship with insulin/insulin resistance.   Identify symptoms of diabetes.   Describe lab tests used to diagnose diabetes.   Describe health complications and conditions related to diabetes.   Goals:   Record weight taken outside of class.   Track foods and beverages eaten each day in the "Food and Activity Tracker," including calories and fat grams for each item.    Track activity type, minutes you were active, and distance you reached each day in the "Food and Activity Tracker."   Follow-Up Plan:  Attend session 19 in two weeks.   Bring completed "Food and Activity Trackers" to next session to be reviewed by [facility].

## 2018-02-09 DIAGNOSIS — M25562 Pain in left knee: Secondary | ICD-10-CM | POA: Diagnosis not present

## 2018-02-09 DIAGNOSIS — M799 Soft tissue disorder, unspecified: Secondary | ICD-10-CM | POA: Diagnosis not present

## 2018-02-09 DIAGNOSIS — M25662 Stiffness of left knee, not elsewhere classified: Secondary | ICD-10-CM | POA: Diagnosis not present

## 2018-02-09 DIAGNOSIS — M62552 Muscle wasting and atrophy, not elsewhere classified, left thigh: Secondary | ICD-10-CM | POA: Diagnosis not present

## 2018-02-10 ENCOUNTER — Telehealth: Payer: Self-pay | Admitting: *Deleted

## 2018-02-10 NOTE — Telephone Encounter (Signed)
Called patient to remind of post seed appts. and MRI, spoke with patient and he is aware of these appts.

## 2018-02-11 ENCOUNTER — Ambulatory Visit
Admission: RE | Admit: 2018-02-11 | Discharge: 2018-02-11 | Disposition: A | Discharge status: Home / Self Care | Payer: PPO | Source: Ambulatory Visit | Attending: Radiation Oncology | Admitting: Radiation Oncology

## 2018-02-11 ENCOUNTER — Ambulatory Visit (HOSPITAL_COMMUNITY)
Admission: RE | Admit: 2018-02-11 | Discharge: 2018-02-11 | Disposition: A | Discharge status: Home / Self Care | Payer: PPO | Source: Ambulatory Visit | Attending: Urology | Admitting: Urology

## 2018-02-11 ENCOUNTER — Other Ambulatory Visit: Payer: Self-pay

## 2018-02-11 ENCOUNTER — Encounter: Payer: Self-pay | Admitting: Radiation Oncology

## 2018-02-11 VITALS — BP 128/68 | HR 58 | Temp 98.4°F | Resp 18 | Wt 216.4 lb

## 2018-02-11 DIAGNOSIS — C61 Malignant neoplasm of prostate: Secondary | ICD-10-CM

## 2018-02-11 DIAGNOSIS — I4891 Unspecified atrial fibrillation: Secondary | ICD-10-CM | POA: Diagnosis not present

## 2018-02-11 DIAGNOSIS — J4520 Mild intermittent asthma, uncomplicated: Secondary | ICD-10-CM | POA: Diagnosis not present

## 2018-02-11 DIAGNOSIS — M62552 Muscle wasting and atrophy, not elsewhere classified, left thigh: Secondary | ICD-10-CM | POA: Diagnosis not present

## 2018-02-11 DIAGNOSIS — Z923 Personal history of irradiation: Secondary | ICD-10-CM | POA: Diagnosis not present

## 2018-02-11 DIAGNOSIS — M25562 Pain in left knee: Secondary | ICD-10-CM | POA: Diagnosis not present

## 2018-02-11 DIAGNOSIS — I10 Essential (primary) hypertension: Secondary | ICD-10-CM | POA: Diagnosis not present

## 2018-02-11 DIAGNOSIS — M25662 Stiffness of left knee, not elsewhere classified: Secondary | ICD-10-CM | POA: Diagnosis not present

## 2018-02-11 DIAGNOSIS — F322 Major depressive disorder, single episode, severe without psychotic features: Secondary | ICD-10-CM | POA: Diagnosis not present

## 2018-02-11 DIAGNOSIS — Z79899 Other long term (current) drug therapy: Secondary | ICD-10-CM | POA: Diagnosis not present

## 2018-02-11 DIAGNOSIS — E785 Hyperlipidemia, unspecified: Secondary | ICD-10-CM | POA: Diagnosis not present

## 2018-02-11 DIAGNOSIS — F329 Major depressive disorder, single episode, unspecified: Secondary | ICD-10-CM | POA: Diagnosis not present

## 2018-02-11 DIAGNOSIS — M799 Soft tissue disorder, unspecified: Secondary | ICD-10-CM | POA: Diagnosis not present

## 2018-02-11 NOTE — Progress Notes (Signed)
Weight and vitals stable. Denies pain. Post seed IPSS 10. Reports occasional dysuria. Denies hematuria. Denies urinary leakage or incontinence. Scheduled for MRI at 5 pm today to confirm SpaceOar placement. Scheduled to follow up at [facility] on 12/2.   BP 128/68   Pulse (!) 58   Temp 98.4 F (36.9 C)   Resp 18   Wt 216 lb 6.4 oz (98.2 kg)   SpO2 99%   BMI 27.78 kg/m  Wt Readings from Last 3 Encounters:  02/11/18 216 lb 6.4 oz (98.2 kg)  02/06/18 215 lb 1.6 oz (97.6 kg)  01/23/18 214 lb (97.1 kg)

## 2018-02-11 NOTE — Progress Notes (Signed)
  Radiation Oncology         [phone] ________________________________  Name: Jonathan Vargas MRN: [id number]  Date: 02/11/2018  DOB: September 14, 1945  COMPLEX SIMULATION NOTE  NARRATIVE:  The patient was brought to the [facility] today following prostate seed implantation approximately one month ago.  Identity was confirmed.  All relevant records and images related to the planned course of therapy were reviewed.  Then, the patient was set-up supine.  CT images were obtained.  The CT images were loaded into the planning software.  Then the prostate and rectum were contoured.  Treatment planning then occurred.  The implanted iodine 125 seeds were identified by the physics staff for projection of radiation distribution  I have requested : 3D Simulation  I have requested a DVH of the following structures: Prostate and rectum.    ________________________________  Sheral Apley Tammi Klippel, M.D.  This document serves as a record of services personally performed by Tyler Pita, MD. It was created on his behalf by Wilburn Mylar, a trained medical scribe. The creation of this record is based on the scribe's personal observations and the provider's statements to them. This document has been checked and approved by the attending provider.

## 2018-02-11 NOTE — Progress Notes (Signed)
Radiation Oncology         [phone]) [phone] ________________________________  Name: Jonathan Vargas MRN: [id number]  Date: 02/11/2018  DOB: 23-Nov-1945  Post-Seed Follow-Up Visit Note  CC: Jonathan Cha, MD  Jonathan Vargas,*  Diagnosis:   72 y.o. gentleman with Stage T1c adenocarcinoma of the prostate with Gleason Score of 3+4, and PSA of 5.94.    ICD-10-CM   1. Malignant neoplasm of prostate (HCC) C61     Interval Since Last Radiation:  2 weeks and 5 days  01/23/18: Insertion of radioactive I-125 seeds into the prostate gland; 145 Gy, definitive therapy with placement of SpaceOAR gel.  Narrative:  The patient returns today for routine follow-up.  He is complaining of increased urinary frequency and urinary hesitation symptoms. He filled out a questionnaire regarding urinary function today providing and overall IPSS score of 10 characterizing his symptoms as moderate.  He reports occasional dysuria which is gradually improving.  His pre-implant IPSS score was 20. He denies any bowel symptoms. He is scheduled for MRI to confirm SpaceOAR placement later today at 5 pm.  ALLERGIES:  is allergic to clindamycin/lincomycin; ciprofloxacin; and amoxicillin.  Meds: Current Outpatient Medications  Medication Sig Dispense Refill  . albuterol (PROVENTIL HFA;VENTOLIN HFA) 108 (90 Base) MCG/ACT inhaler Inhale 2 puffs into the lungs every 6 (six) hours as needed for wheezing or shortness of breath.    . allopurinol (ZYLOPRIM) 300 MG tablet Take 300 mg by mouth every evening.   1  . CALCIUM PO Take 200 mg by mouth 2 (two) times daily.    Marland Kitchen COLCRYS 0.6 MG tablet Take 0.6 mg by mouth 2 (two) times daily as needed (for gout).     Marland Kitchen diclofenac sodium (VOLTAREN) 1 % GEL Apply 1 application topically 4 (four) times daily as needed (for pain).   3  . fexofenadine (ALLEGRA) 60 MG tablet Take 60 mg by mouth daily as needed. For allergies    . flecainide (TAMBOCOR) 50 MG tablet TAKE 1 TABLET BY  MOUTH 2 TIMES DAILY 180 tablet 2  . fluticasone (FLONASE) 50 MCG/ACT nasal spray Place 1 spray into the nose every evening.     . irbesartan (AVAPRO) 150 MG tablet TAKE 1 TABLET BY MOUTH DAILY. (Patient taking differently: daily. ) 90 tablet 2  . MAGNESIUM PO Take 2 capsules by mouth every evening.     . Misc Natural Products (LUTEIN 20) CAPS Take 40 mg by mouth 2 (two) times daily.     . NON FORMULARY CPAP    . traMADol (ULTRAM) 50 MG tablet Take 1 tablet (50 mg total) by mouth every 6 (six) hours as needed for moderate pain. 10 tablet 0  . TURMERIC PO Take 1 capsule by mouth 2 (two) times daily.     . vitamin B-12 (CYANOCOBALAMIN) 1000 MCG tablet Take 1,000 mcg by mouth daily.    Marland Kitchen warfarin (COUMADIN) 5 MG tablet TAKE 2 OR 2.5 TABLETS DAILY AS DIRECTED BY COUMADIN CLINIC 210 tablet 1   No current facility-administered medications for this encounter.     Physical Findings: In general this is a well appearing Caucasian gentleman in no acute distress. He's alert and oriented x4 and appropriate throughout the examination. Cardiopulmonary assessment is negative for acute distress and he exhibits normal effort.   Lab Findings: Lab Results  Component Value Date   WBC 6.3 01/16/2018   HGB 14.6 01/16/2018   HCT 44.5 01/16/2018   MCV 102.5 (H) 01/16/2018   PLT 264 01/16/2018  Radiographic Findings:  Patient underwent CT imaging in our clinic for post implant dosimetry. The CT was reviewed by Dr. Tammi Klippel and appears to demonstrate an adequate distribution of radioactive seeds throughout the prostate gland. There are no seeds in or near the rectum.  He will have a prostate MRI at 5 PM today and these images will be fused with his CT images for further evaluation.  We suspect the final radiation plan and dosimetry will show appropriate coverage of the prostate gland.   Impression/Plan: The patient is recovering from the effects of radiation. His urinary symptoms should gradually improve over the  next 4-6 months. We talked about this today. He is encouraged by his improvement already and is otherwise pleased with his outcome. We also talked about long-term follow-up for prostate cancer following seed implant. He understands that ongoing PSA determinations and digital rectal exams will help perform surveillance to rule out disease recurrence. He has a follow up appointment scheduled with Dr. Diona Fanti on 02/16/18. He understands what to expect with his PSA measures. Patient was also educated today about some of the long-term effects from radiation including a small risk for rectal bleeding and possibly erectile dysfunction. We talked about some of the general management approaches to these potential complications. However, I did encourage the patient to contact our office or return at any point if he has questions or concerns related to his previous radiation and prostate cancer.    Nicholos Johns, PA-C  This document serves as a record of services personally performed by [employer], PA-C. It was created on her behalf by Jonathan Vargas, a trained medical scribe. The creation of this record is based on the scribe's personal observations and the provider's statements to them. This document has been checked and approved by the attending provider.

## 2018-02-16 DIAGNOSIS — M25562 Pain in left knee: Secondary | ICD-10-CM | POA: Diagnosis not present

## 2018-02-16 DIAGNOSIS — M25662 Stiffness of left knee, not elsewhere classified: Secondary | ICD-10-CM | POA: Diagnosis not present

## 2018-02-16 DIAGNOSIS — M799 Soft tissue disorder, unspecified: Secondary | ICD-10-CM | POA: Diagnosis not present

## 2018-02-16 DIAGNOSIS — M62552 Muscle wasting and atrophy, not elsewhere classified, left thigh: Secondary | ICD-10-CM | POA: Diagnosis not present

## 2018-02-16 DIAGNOSIS — C61 Malignant neoplasm of prostate: Secondary | ICD-10-CM | POA: Diagnosis not present

## 2018-02-17 DIAGNOSIS — J321 Chronic frontal sinusitis: Secondary | ICD-10-CM | POA: Diagnosis not present

## 2018-02-17 MED FILL — IRBESARTAN 150 MG TABLET: 150 | 90 days supply | Qty: 90 | Fill #1

## 2018-02-18 DIAGNOSIS — M25662 Stiffness of left knee, not elsewhere classified: Secondary | ICD-10-CM | POA: Diagnosis not present

## 2018-02-18 DIAGNOSIS — M25562 Pain in left knee: Secondary | ICD-10-CM | POA: Diagnosis not present

## 2018-02-18 DIAGNOSIS — M799 Soft tissue disorder, unspecified: Secondary | ICD-10-CM | POA: Diagnosis not present

## 2018-02-18 DIAGNOSIS — M62552 Muscle wasting and atrophy, not elsewhere classified, left thigh: Secondary | ICD-10-CM | POA: Diagnosis not present

## 2018-02-20 ENCOUNTER — Encounter: Payer: PPO | Attending: Internal Medicine | Admitting: Registered"

## 2018-02-20 ENCOUNTER — Encounter: Payer: Self-pay | Admitting: Registered"

## 2018-02-20 DIAGNOSIS — R7303 Prediabetes: Secondary | ICD-10-CM

## 2018-02-20 NOTE — Progress Notes (Signed)
Patient was seen on 02/20/18 for the Diabetes Prevention Program course at [facility]. By the end of this session patients are able to complete the following objectives:   Learning Objectives:  Identify which foods contain carbohydrates.   List components of a balanced meal.   Goals:   Record weight taken outside of class.   Track foods and beverages eaten each day in the "Food and Activity Tracker," including calories and fat grams for each item.    Track activity type, minutes active, and distance reached each day in the "Food and Activity Tracker."   Follow-Up Plan:  Attend next session.   Bring completed "Food and Activity Tracker" next week to be reviewed by Lifestyle Coach.

## 2018-02-23 ENCOUNTER — Ambulatory Visit: Payer: PPO | Admitting: Pharmacist

## 2018-02-23 DIAGNOSIS — I480 Paroxysmal atrial fibrillation: Secondary | ICD-10-CM | POA: Diagnosis not present

## 2018-02-23 DIAGNOSIS — J4520 Mild intermittent asthma, uncomplicated: Secondary | ICD-10-CM | POA: Diagnosis not present

## 2018-02-23 DIAGNOSIS — M62552 Muscle wasting and atrophy, not elsewhere classified, left thigh: Secondary | ICD-10-CM | POA: Diagnosis not present

## 2018-02-23 DIAGNOSIS — M799 Soft tissue disorder, unspecified: Secondary | ICD-10-CM | POA: Diagnosis not present

## 2018-02-23 DIAGNOSIS — M25662 Stiffness of left knee, not elsewhere classified: Secondary | ICD-10-CM | POA: Diagnosis not present

## 2018-02-23 DIAGNOSIS — I4891 Unspecified atrial fibrillation: Secondary | ICD-10-CM | POA: Diagnosis not present

## 2018-02-23 DIAGNOSIS — F329 Major depressive disorder, single episode, unspecified: Secondary | ICD-10-CM | POA: Diagnosis not present

## 2018-02-23 DIAGNOSIS — F322 Major depressive disorder, single episode, severe without psychotic features: Secondary | ICD-10-CM | POA: Diagnosis not present

## 2018-02-23 DIAGNOSIS — M25562 Pain in left knee: Secondary | ICD-10-CM | POA: Diagnosis not present

## 2018-02-23 DIAGNOSIS — I10 Essential (primary) hypertension: Secondary | ICD-10-CM | POA: Diagnosis not present

## 2018-02-23 DIAGNOSIS — E785 Hyperlipidemia, unspecified: Secondary | ICD-10-CM | POA: Diagnosis not present

## 2018-02-23 LAB — POCT INR: INR: 2.8 (ref 2.0–3.0)

## 2018-02-23 NOTE — Patient Instructions (Signed)
Description   Continue on same dosage 2.5 tablets daily except 2 tablets on Sundays, Tuesdays, and Fridays.  Recheck in 4 weeks.  Call if placed on any new medications [phone].

## 2018-02-24 DIAGNOSIS — G4733 Obstructive sleep apnea (adult) (pediatric): Secondary | ICD-10-CM | POA: Diagnosis not present

## 2018-02-25 DIAGNOSIS — M25662 Stiffness of left knee, not elsewhere classified: Secondary | ICD-10-CM | POA: Diagnosis not present

## 2018-02-25 DIAGNOSIS — M62552 Muscle wasting and atrophy, not elsewhere classified, left thigh: Secondary | ICD-10-CM | POA: Diagnosis not present

## 2018-02-25 DIAGNOSIS — M799 Soft tissue disorder, unspecified: Secondary | ICD-10-CM | POA: Diagnosis not present

## 2018-02-25 DIAGNOSIS — M25562 Pain in left knee: Secondary | ICD-10-CM | POA: Diagnosis not present

## 2018-03-16 ENCOUNTER — Ambulatory Visit
Admission: RE | Admit: 2018-03-16 | Discharge: 2018-03-16 | Disposition: A | Discharge status: Home / Self Care | Payer: PPO | Source: Ambulatory Visit | Attending: Radiation Oncology | Admitting: Radiation Oncology

## 2018-03-16 ENCOUNTER — Encounter: Payer: Self-pay | Admitting: Radiation Oncology

## 2018-03-16 DIAGNOSIS — C61 Malignant neoplasm of prostate: Secondary | ICD-10-CM | POA: Insufficient documentation

## 2018-03-17 DIAGNOSIS — G4733 Obstructive sleep apnea (adult) (pediatric): Secondary | ICD-10-CM | POA: Diagnosis not present

## 2018-03-20 ENCOUNTER — Encounter: Payer: PPO | Attending: Internal Medicine | Admitting: Registered"

## 2018-03-20 ENCOUNTER — Encounter: Payer: Self-pay | Admitting: Registered"

## 2018-03-20 DIAGNOSIS — R7303 Prediabetes: Secondary | ICD-10-CM | POA: Diagnosis not present

## 2018-03-20 NOTE — Progress Notes (Signed)
Patient was seen on 03/20/18 for the Diabetes Prevention Program course at [facility]. By the end of this session patients are able to complete the following objectives:   Learning Objectives:  Describe the differences between unsaturated, saturated, and trans fat on heart health.   List dietary sources of unsaturated, saturated, and trans fats.  Explain ways to reduce intake of saturated fat and replace them with heart healthy fats.  Goals:   Record weight taken outside of class.   Track foods and beverages eaten each day in the "Food and Activity Tracker," including calories and fat grams for each item.    Track activity type, minutes you were active, and distance you reached each day in the "Food and Activity Tracker."   Follow-Up Plan:  Attend next session.   Bring completed "Food and Activity Trackers" to next session to be reviewed by Lifestyle Coach.

## 2018-03-23 ENCOUNTER — Ambulatory Visit: Payer: PPO | Admitting: *Deleted

## 2018-03-23 DIAGNOSIS — I480 Paroxysmal atrial fibrillation: Secondary | ICD-10-CM

## 2018-03-23 LAB — POCT INR: INR: 3.8 — AB (ref 2.0–3.0)

## 2018-03-23 NOTE — Patient Instructions (Signed)
Description   Skip today's then start taking 2 tablets daily except 2.5 tablets on Mondays, Wednesdays and Fridays. Recheck in 2 weeks.  Call if placed on any new medications [phone].

## 2018-04-02 ENCOUNTER — Ambulatory Visit (INDEPENDENT_AMBULATORY_CARE_PROVIDER_SITE_OTHER): Payer: PPO | Admitting: Orthopaedic Surgery

## 2018-04-05 NOTE — Progress Notes (Signed)
  Radiation Oncology         [phone] ________________________________  Name: Jonathan Vargas MRN: [id number]  Date: 03/16/2018  DOB: 11/26/1945  3D Planning Note   Prostate Brachytherapy Post-Implant Dosimetry  Diagnosis: 73 y.o. gentleman with Stage T1c adenocarcinoma of the prostate with Gleason Score of 3+4, and PSA of 5.94  Narrative: On a previous date, Jonathan Vargas returned following prostate seed implantation for post implant planning. He underwent CT scan complex simulation to delineate the three-dimensional structures of the pelvis and demonstrate the radiation distribution.  Since that time, the seed localization, and complex isodose planning with dose volume histograms have now been completed.  Results:   Prostate Coverage - The dose of radiation delivered to the 90% or more of the prostate gland (D90) was 111.45% of the prescription dose. This exceeds our goal of greater than 90%. Rectal Sparing - The volume of rectal tissue receiving the prescription dose or higher was 0.0 cc. This falls under our thresholds tolerance of 1.0 cc.  Impression: The prostate seed implant appears to show adequate target coverage and appropriate rectal sparing.  Plan:  The patient will continue to follow with urology for ongoing PSA determinations. I would anticipate a high likelihood for local tumor control with minimal risk for rectal morbidity.  ________________________________  Sheral Apley Tammi Klippel, M.D.

## 2018-04-06 ENCOUNTER — Ambulatory Visit: Payer: PPO | Admitting: *Deleted

## 2018-04-06 DIAGNOSIS — I480 Paroxysmal atrial fibrillation: Secondary | ICD-10-CM | POA: Diagnosis not present

## 2018-04-06 LAB — POCT INR: INR: 2.6 (ref 2.0–3.0)

## 2018-04-06 NOTE — Patient Instructions (Signed)
Description   Continue  taking 2 tablets daily except 2.5 tablets on Mondays, Wednesdays and Fridays. Recheck in 2 weeks.  Call if placed on any new medications [phone].

## 2018-04-17 DIAGNOSIS — G4733 Obstructive sleep apnea (adult) (pediatric): Secondary | ICD-10-CM | POA: Diagnosis not present

## 2018-04-20 ENCOUNTER — Ambulatory Visit: Payer: PPO

## 2018-04-20 DIAGNOSIS — I480 Paroxysmal atrial fibrillation: Secondary | ICD-10-CM

## 2018-04-20 LAB — POCT INR: INR: 2.6 (ref 2.0–3.0)

## 2018-04-20 NOTE — Patient Instructions (Signed)
Description   Continue on same dosage 2 tablets daily except 2.5 tablets on Mondays, Wednesdays and Fridays. Recheck in 4 weeks.  Call if placed on any new medications [phone].

## 2018-04-24 ENCOUNTER — Encounter: Payer: PPO | Attending: Internal Medicine | Admitting: Registered"

## 2018-04-24 ENCOUNTER — Encounter: Payer: Self-pay | Admitting: Registered"

## 2018-04-24 DIAGNOSIS — R7303 Prediabetes: Secondary | ICD-10-CM | POA: Diagnosis not present

## 2018-04-24 NOTE — Progress Notes (Signed)
Patient was seen on 04/24/18 for Session 19 of Diabetes Prevention Program course at [facility]. By the end of this session patients are able to complete the following objectives:   Learning Objectives:  List ways to make recipes healthier.   List lower-fat and lower-calorie substitutions for common ingredients.   Identify low-fat cooking methods.   Describe how to choose a cookbook that works best for their needs.   Goals:   Record weight taken outside of class.   Track foods and beverages eaten each day in the "Food and Activity Tracker," including calories and fat grams for each item.    Track activity type, minutes you were active, and distance you reached each day in the "Food and Activity Tracker."   Follow-Up Plan:  Attend next session.   Bring completed "Food and Activity Trackers" to next session to be reviewed by Lifestyle Coach.

## 2018-04-27 ENCOUNTER — Other Ambulatory Visit (HOSPITAL_COMMUNITY): Payer: Self-pay

## 2018-04-28 ENCOUNTER — Encounter (HOSPITAL_COMMUNITY): Payer: PPO

## 2018-05-04 ENCOUNTER — Ambulatory Visit (INDEPENDENT_AMBULATORY_CARE_PROVIDER_SITE_OTHER): Payer: PPO | Admitting: Nurse Practitioner

## 2018-05-04 ENCOUNTER — Encounter: Payer: Self-pay | Admitting: Nurse Practitioner

## 2018-05-04 VITALS — BP 128/80 | HR 46 | Ht 74.0 in | Wt 211.1 lb

## 2018-05-04 DIAGNOSIS — I4819 Other persistent atrial fibrillation: Secondary | ICD-10-CM

## 2018-05-04 DIAGNOSIS — I480 Paroxysmal atrial fibrillation: Secondary | ICD-10-CM | POA: Diagnosis not present

## 2018-05-04 DIAGNOSIS — Z7901 Long term (current) use of anticoagulants: Secondary | ICD-10-CM

## 2018-05-04 NOTE — Patient Instructions (Addendum)
We will be checking the following labs today - NONE  Medication Instructions:    Continue with your current medicines.    If you need a refill on your cardiac medications before your next appointment, please call your pharmacy.     Testing/Procedures To Be Arranged:  N/A  Follow-Up:   See me in about 4 months with EKG    At [facility], you and your health needs are our priority.  As part of our continuing mission to provide you with exceptional heart care, we have created designated Provider Care Teams.  These Care Teams include your primary Cardiologist (physician) and Advanced Practice Providers (APPs -  Physician Assistants and Nurse Practitioners) who all work together to provide you with the care you need, when you need it.  Special Instructions:  . None  Call the [place] office at [phone] if you have any questions, problems or concerns.

## 2018-05-04 NOTE — Progress Notes (Signed)
CARDIOLOGY OFFICE NOTE  Date:  05/04/2018    Jonathan Vargas Date of Birth: [birth year] Medical Record #[id number]  PCP:  Leeroy Cha, MD  Cardiologist:  Marisa Cyphers    Chief Complaint  Patient presents with  . Atrial Fibrillation    Follow up visit - seen for Dr. Marlou Porch    History of Present Illness: Jonathan Vargas is a 73 y.o. male who presents today for a 4 month check. Seen for Dr. Marlou Porch &Allred.   [name]".   He has a history ofatrial fibrillation/flutter paroxysmal on antiarrhythmic and is on chronic warfarin for his anticoagulation. Had stress test 09/01/12 that demonstrated a mild inferior wall defect, possible diaphragmatic attenuation, overall low risk study with normal ejection fraction. Other issues include HTN, prior carotid artery dissection in 1997, ongoing tobacco abuse, hemachromatosis. Previous hospitalization in November 2012 demonstrated paroxysmal atrial flutter which converted after 4 hours of diltiazem. He had a postconversion pause of 8.2 seconds, felt flushed. Endorses chronic palpitations.   I saw him as a work in back in [place]- out of rhythm. Somewhat symptomatic. Got him cardioverted and back to see [name] for discussion of other options - but opted for his continued regimen. ARB was restarted for elevated BP. Patient elected to continue CCB therapy despite bradycardia.He was continued on his anticoagulation with coumadin.   Dr. Marlou Porch and I have followed him since. He has had intermittent issues with his BP. He has ongoing use of alcohol. He has had to have CCB eventually stopped due to presyncope. There was concern for post conversion pauses which has been documented in the past.  I saw him in July - he was still smoking some. Still drinking. Was going to diabetic classes and was trying to work on his weight. He had had a stress leg fracture at last visit - had stepped off a curb. Last visit with me was  back in October - he was losing weight intentionally. Still having issues with his leg from prior stress fracture. Some atypical chest pain. Still smoking. Needed clearance for GU surgery - we got his stress test updated - this turned out low risk.   Comes in today. Here alone.He feels ok. No chest pain. Breathing is good. He continues to work out/exercise and see the nutritionist. He continues to be successful with weight loss. Rare lightheadedness. No syncope. He did have a spell of atrial flutter about 6 weeks ago - this was associated with shortness of breath - he usually does not have that. It was short lived. He got thru his GU procedure - had 92 seed implant. He does note that his heart rate will get in the 90's with exercise.   Past Medical History:  Diagnosis Date  . Acid reflux   . Arthritis   . ASD (atrial septal defect)   . Asthma with allergic rhinitis and status asthmaticus    NO RECENT FLARE UPS  . Atrial fib/flutter, transient   . Cancer ([place])    skin  . Chronic anticoagulation   . Depression   . Dysrhythmia    ATRIAL FIB  . ENT complaint    Dr. Erik Obey  . Erectile dysfunction   . Fatty liver   . FHx: allergies   . Gout    , Possible, Right Knee x2 ; intermittent milder episodes of pain in hte first MTP - uric acid 7.7- no treatment so far.  . Hemochromatosis  DOES TWICE YEAR  . History of kidney stones    SMALL IN RIGHT KIDNEY  . Hypercholesteremia   . Hyperlipemia    PT DENIES  . Hypertension   . Hypogonadism male   . Impaired fasting glucose   . 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  . Pneumonia    " walking PNA > 40 years ago"  . Prostate cancer ([place]) [place] 2018  . Seasonal allergies   . Sleep apnea    wears CPAP ( set at 4-11)  . Trigger finger, left    , fourth finger    Past Surgical History:  Procedure Laterality Date  . CARDIOVERSION N/A 05/29/2016   Procedure:  CARDIOVERSION;  Surgeon: [name], MD;  Location: [place];  Service: Cardiovascular;  Laterality: N/A;  . CATARACT EXTRACTION W/ INTRAOCULAR LENS  IMPLANT, BILATERAL  2016  . COLONOSCOPY WITH PROPOFOL N/A 09/26/2016   Procedure: COLONOSCOPY WITH PROPOFOL;  Surgeon: Wilford Corner, MD;  Location: [place];  Service: Endoscopy;  Laterality: N/A;  . CYSTOSCOPY  01/23/2018   Procedure: CYSTOSCOPY FLEXIBLE;  Surgeon: Franchot Gallo, MD;  Location: [facility];  Service: Urology;;  no seeds found in bladder  . ESOPHAGOGASTRODUODENOSCOPY (EGD) WITH PROPOFOL N/A 09/26/2016   Procedure: ESOPHAGOGASTRODUODENOSCOPY (EGD) WITH PROPOFOL;  Surgeon: Wilford Corner, MD;  Location: [place];  Service: Endoscopy;  Laterality: N/A;  . LIVER BIOPSY  MANY YRS AGO  . NASAL SINUS SURGERY  1997  . RADIOACTIVE SEED IMPLANT N/A 01/23/2018   Procedure: RADIOACTIVE SEED IMPLANT/BRACHYTHERAPY IMPLANT;  Surgeon: Franchot Gallo, MD;  Location[facility];  Service: Urology;  Laterality: N/A;   69 seeds implanted  . SPACE OAR INSTILLATION N/A 01/23/2018   Procedure: SPACE OAR INSTILLATION;  Surgeon: Franchot Gallo, MD;  Location: [facility];  Service: Urology;  Laterality: N/A;     Medications: Current Meds  Medication Sig  . albuterol (PROVENTIL HFA;VENTOLIN HFA) 108 (90 Base) MCG/ACT inhaler Inhale 2 puffs into the lungs every 6 (six) hours as needed for wheezing or shortness of breath.  . allopurinol (ZYLOPRIM) 300 MG tablet Take 300 mg by mouth every evening.   Marland Kitchen CALCIUM PO Take 200 mg by mouth 2 (two) times daily.  Marland Kitchen COLCRYS 0.6 MG tablet Take 0.6 mg by mouth 2 (two) times daily as needed (for gout).   Marland Kitchen diclofenac sodium (VOLTAREN) 1 % GEL Apply 1 application topically 4 (four) times daily as needed (for pain).   . fexofenadine (ALLEGRA) 60 MG tablet Take 60 mg by mouth daily as needed. For allergies  . flecainide (TAMBOCOR) 50 MG  tablet TAKE 1 TABLET BY MOUTH 2 TIMES DAILY  . fluticasone (FLONASE) 50 MCG/ACT nasal spray Place 1 spray into the nose every evening.   . irbesartan (AVAPRO) 150 MG tablet Take 150 mg by mouth daily.  Marland Kitchen MAGNESIUM PO Take 2 capsules by mouth every evening.   . Misc Natural Products (LUTEIN 20) CAPS Take 40 mg by mouth 2 (two) times daily.   . NON FORMULARY CPAP  . TURMERIC PO Take 1 capsule by mouth 2 (two) times daily.   . vitamin B-12 (CYANOCOBALAMIN) 1000 MCG tablet Take 1,000 mcg by mouth daily.  Marland Kitchen warfarin (COUMADIN) 5 MG tablet TAKE 2 OR 2.5 TABLETS DAILY AS DIRECTED BY COUMADIN CLINIC  . [DISCONTINUED] irbesartan (AVAPRO) 150 MG tablet TAKE 1 TABLET BY MOUTH DAILY. (Patient taking differently: daily. )  . [DISCONTINUED] traMADol Veatrice Bourbon)  50 MG tablet Take 1 tablet (50 mg total) by mouth every 6 (six) hours as needed for moderate pain.     Allergies: Allergies  Allergen Reactions  . Clindamycin/Lincomycin Diarrhea and Other (See Comments)    Stomach pain   . Ciprofloxacin Other (See Comments)    Joint pain  . Amoxicillin Rash    Social History: The patient  reports that he has been smoking cigarettes. He started smoking about 4 years ago. He has a 12.50 pack-year smoking history. He has quit using smokeless tobacco.  His smokeless tobacco use included chew. He reports current alcohol use. He reports that he does not use drugs.   Family History: The patient's family history includes Breast cancer in his sister; Colon cancer in his father; Diabetes type II in his father and maternal grandfather; Heart disease in his mother; Leukemia in his paternal uncle; Prostate cancer in an other family member; Stroke in his father.   Review of Systems: Please see the history of present illness.   Otherwise, the review of systems is positive for none.   All other systems are reviewed and negative.   Physical Exam: VS:  BP 128/80 (BP Location: Left Arm, Patient Position: Sitting, Cuff Size:  Normal)   Pulse (!) 46   Ht 6\' 2"  (1.88 m)   Wt 211 lb 1.9 oz (95.8 kg)   BMI 27.11 kg/m  .  BMI Body mass index is 27.11 kg/m.  Wt Readings from Last 3 Encounters:  05/04/18 211 lb 1.9 oz (95.8 kg)  04/24/18 215 lb 9.6 oz (97.8 kg)  03/20/18 222 lb 8 oz (100.9 kg)   His heart rate is 56 by the time of my exam - apical pulse obtained.   General: Pleasant. Well developed, well nourished and in no acute distress. His weight continues to decrease.  Down 7 more pounds since my last visit.  HEENT: Normal.  Neck: Supple, no JVD, carotid bruits, or masses noted.  Cardiac: Regular rate and rhythm - rare ectopic - little slow. No murmurs, rubs, or gallops. No edema.  Respiratory:  Lungs are clear to auscultation bilaterally with normal work of breathing.  GI: Soft and nontender.  MS: No deformity or atrophy. Gait and ROM intact.  Skin: Warm and dry. Color is normal.  Neuro:  Strength and sensation are intact and no gross focal deficits noted.  Psych: Alert, appropriate and with normal affect.   LABORATORY DATA:  EKG:  EKG is ordered today. This shows sinus bradycardia - HR 46.   Lab Results  Component Value Date   WBC 6.3 01/16/2018   HGB 14.6 01/16/2018   HCT 44.5 01/16/2018   PLT 264 01/16/2018   GLUCOSE 85 01/16/2018   ALT 16 01/16/2018   AST 19 01/16/2018   NA 135 01/16/2018   K 4.4 01/16/2018   CL 102 01/16/2018   CREATININE 0.94 01/16/2018   BUN 24 (H) 01/16/2018   CO2 25 01/16/2018   TSH 3.400 05/27/2016   INR 2.6 04/20/2018      BNP (last 3 results) No results for input(s): BNP in the last 8760 hours.  ProBNP (last 3 results) No results for input(s): PROBNP in the last 8760 hours.   Other Studies Reviewed Today:  Myoview Study Highlights 12/2017   Nuclear stress EF: 55%.  There was no ST segment deviation noted during stress.  The study is normal.  This is a low risk study.  The left ventricular ejection fraction is normal (55-65%).  Echo  Study Conclusions 05/2016  - Left ventricle: The cavity size was normal. Wall thickness was increased in a pattern of mild LVH. Systolic function was normal. The estimated ejection fraction was in the range of 55% to 60%. Wall motion was normal; there were no regional wall motion abnormalities. Left ventricular diastolic function parameters were normal. - Left atrium: The atrium was moderately dilated. - Atrial septum: No defect or patent foramen ovale was identified.  Procedure: Electrical Cardioversion Indications:Atrial Flutter  Procedure Details Consent: Risks of procedure as well as the alternatives and risks of each were explained to the (patient/caregiver). Consent for procedure obtained. Time HQP:[id number] patient identification, verified procedure, site/side was marked, verified correct patient position, special equipment/implants available, medications/allergies/relevent history reviewed, required imaging and test results available. Performed  Patient placed on cardiac monitor, pulse oximetry, supplemental oxygen as necessary.  Sedation given: Propofol 70 mg Pacer pads placed anterior and posterior chest. Cardioverted 1time(s).  Cardioverted at 120J. Evaluation Findings: Post procedure EKG shows: NSR Complications: None Patient didtolerate procedure well.  [name], MD 05/29/2016, 1:53 PM    Assessment/Plan:  1. PAF/flutter - last cardioversion in 2018. Has seen EPin the past- opted to continue with current regimen for his PAF. He has had chronic bradycardai - little worse by EKG today - does not sound like he is all that symptomatic - he is reminded to let us know if lightheadedness worsens or begins to have more arrhythmia. PPM may be needed at some point. No indication for PPM at this time. There is still concern that he has had prior post conversion pauses.   2. HTN - BP fine today. No changes made.   3. Chronic  anticoagulation with coumadin -followed here - no problems noted.   4. Obesity -he is still attending his diabetic classes and continues to be successful with his weight loss. Down 7 more pounds since last visit with me.    Current medicines are reviewed with the patient today.  The patient does not have concerns regarding medicines other than what has been noted above.  The following changes have been made:  See above.  Labs/ tests ordered today include:    Orders Placed This Encounter  Procedures  . EKG 12-Lead     Disposition:   FU with me in 4 months. Recheck EKG on return.   Patient is agreeable to this plan and will call if any problems develop in the interim.   SignedTruitt Merle, NP  05/04/2018 11:25 AM  [facility] [address] [place], [place]  [address] Phone: [phone] Fax: [phone]

## 2018-05-06 ENCOUNTER — Ambulatory Visit (HOSPITAL_COMMUNITY)
Admission: RE | Admit: 2018-05-06 | Discharge: 2018-05-06 | Disposition: A | Discharge status: Home / Self Care | Payer: PPO | Source: Ambulatory Visit | Attending: Internal Medicine | Admitting: Internal Medicine

## 2018-05-06 LAB — POCT HEMOGLOBIN-HEMACUE: HEMOGLOBIN: 13.1 g/dL (ref 13.0–17.0)

## 2018-05-06 NOTE — Progress Notes (Signed)
Hgb 13.1.. One unit phlebotomy without complication from L AC. Tolerated well. Pushing fluids.

## 2018-05-15 DIAGNOSIS — C61 Malignant neoplasm of prostate: Secondary | ICD-10-CM | POA: Diagnosis not present

## 2018-05-16 DIAGNOSIS — G4733 Obstructive sleep apnea (adult) (pediatric): Secondary | ICD-10-CM | POA: Diagnosis not present

## 2018-05-18 ENCOUNTER — Ambulatory Visit (INDEPENDENT_AMBULATORY_CARE_PROVIDER_SITE_OTHER): Payer: PPO | Admitting: Pharmacist

## 2018-05-18 DIAGNOSIS — I480 Paroxysmal atrial fibrillation: Secondary | ICD-10-CM | POA: Diagnosis not present

## 2018-05-18 DIAGNOSIS — Z5181 Encounter for therapeutic drug level monitoring: Secondary | ICD-10-CM

## 2018-05-18 LAB — POCT INR: INR: 2.3 (ref 2.0–3.0)

## 2018-05-18 NOTE — Patient Instructions (Addendum)
Continue on same dosage 2 tablets daily except 2.5 tablets on Mondays, Wednesdays and Fridays. Recheck in 4 weeks. Call if placed on any new medications [phone].

## 2018-05-19 DIAGNOSIS — G4733 Obstructive sleep apnea (adult) (pediatric): Secondary | ICD-10-CM | POA: Diagnosis not present

## 2018-05-22 ENCOUNTER — Ambulatory Visit (HOSPITAL_BASED_OUTPATIENT_CLINIC_OR_DEPARTMENT_OTHER): Payer: PPO | Admitting: Registered"

## 2018-05-22 ENCOUNTER — Encounter: Payer: Self-pay | Admitting: Registered"

## 2018-05-22 DIAGNOSIS — N401 Enlarged prostate with lower urinary tract symptoms: Secondary | ICD-10-CM | POA: Diagnosis not present

## 2018-05-22 DIAGNOSIS — R7303 Prediabetes: Secondary | ICD-10-CM

## 2018-05-22 DIAGNOSIS — C61 Malignant neoplasm of prostate: Secondary | ICD-10-CM | POA: Diagnosis not present

## 2018-05-22 DIAGNOSIS — R3912 Poor urinary stream: Secondary | ICD-10-CM | POA: Diagnosis not present

## 2018-05-22 NOTE — Progress Notes (Signed)
Patient was seen on 05/22/18 for the Diabetes Prevention Program course at [facility]. By the end of this session patients are able to complete the following objectives:   Learning Objectives:  Describe how to incorporate more fruits and vegetables into meals.  List criteria for selecting good quality fruits and vegetables at the store.   Define mindful eating.  List the benefits of eating mindfully.   Goals:   Record weight taken outside of class.   Track foods and beverages eaten each day in the "Food and Activity Tracker," including calories and fat grams for each item.    Track activity type, minutes you were active, and distance you reached each day in the "Food and Activity Tracker."   Follow-Up Plan:  Attend next session.   Bring completed "Food and Activity Trackers" to next session to be reviewed by Lifestyle Coach.

## 2018-05-25 DIAGNOSIS — H353134 Nonexudative age-related macular degeneration, bilateral, advanced atrophic with subfoveal involvement: Secondary | ICD-10-CM | POA: Diagnosis not present

## 2018-05-25 DIAGNOSIS — H40013 Open angle with borderline findings, low risk, bilateral: Secondary | ICD-10-CM | POA: Diagnosis not present

## 2018-05-25 DIAGNOSIS — H35033 Hypertensive retinopathy, bilateral: Secondary | ICD-10-CM | POA: Diagnosis not present

## 2018-05-25 DIAGNOSIS — I708 Atherosclerosis of other arteries: Secondary | ICD-10-CM | POA: Diagnosis not present

## 2018-05-27 MED FILL — IRBESARTAN 150 MG TABLET: 150 | 90 days supply | Qty: 90 | Fill #2

## 2018-06-11 DIAGNOSIS — G4733 Obstructive sleep apnea (adult) (pediatric): Secondary | ICD-10-CM | POA: Diagnosis not present

## 2018-06-12 ENCOUNTER — Telehealth: Payer: Self-pay

## 2018-06-12 ENCOUNTER — Telehealth: Payer: Self-pay | Admitting: Pharmacist

## 2018-06-12 NOTE — Telephone Encounter (Signed)

## 2018-06-12 NOTE — Telephone Encounter (Signed)
LMOM FOR PRESCREEN AND DRIVE THRU 

## 2018-06-15 ENCOUNTER — Ambulatory Visit (INDEPENDENT_AMBULATORY_CARE_PROVIDER_SITE_OTHER): Payer: PPO | Admitting: Pharmacist

## 2018-06-15 DIAGNOSIS — I480 Paroxysmal atrial fibrillation: Secondary | ICD-10-CM

## 2018-06-15 DIAGNOSIS — Z5181 Encounter for therapeutic drug level monitoring: Secondary | ICD-10-CM | POA: Diagnosis not present

## 2018-06-15 DIAGNOSIS — G4733 Obstructive sleep apnea (adult) (pediatric): Secondary | ICD-10-CM | POA: Diagnosis not present

## 2018-06-15 LAB — POCT INR: INR: 2.8 (ref 2.0–3.0)

## 2018-06-17 ENCOUNTER — Telehealth: Payer: Self-pay | Admitting: Cardiology

## 2018-06-17 DIAGNOSIS — E785 Hyperlipidemia, unspecified: Secondary | ICD-10-CM | POA: Diagnosis not present

## 2018-06-17 DIAGNOSIS — J4520 Mild intermittent asthma, uncomplicated: Secondary | ICD-10-CM | POA: Diagnosis not present

## 2018-06-17 DIAGNOSIS — F329 Major depressive disorder, single episode, unspecified: Secondary | ICD-10-CM | POA: Diagnosis not present

## 2018-06-17 DIAGNOSIS — I10 Essential (primary) hypertension: Secondary | ICD-10-CM | POA: Diagnosis not present

## 2018-06-17 DIAGNOSIS — I4891 Unspecified atrial fibrillation: Secondary | ICD-10-CM | POA: Diagnosis not present

## 2018-06-17 MED ORDER — WARFARIN SODIUM 5 MG PO TABS: ORAL_TABLET | ORAL | 1 refills | Status: DC

## 2018-06-17 NOTE — Telephone Encounter (Signed)
New Message    *STAT* If patient is at the pharmacy, call can be transferred to refill team.   1. Which medications need to be refilled? (please list name of each medication and dose if known) warfarin (COUMADIN) 5 MG tablet    2. Which pharmacy/location (including street and city if local pharmacy) is medication to be sent to? [facility]  3. Do they need a 30 day or 90 day supply? 90 dY

## 2018-06-25 ENCOUNTER — Other Ambulatory Visit: Payer: Self-pay

## 2018-06-25 ENCOUNTER — Ambulatory Visit (INDEPENDENT_AMBULATORY_CARE_PROVIDER_SITE_OTHER): Payer: PPO | Admitting: Orthopaedic Surgery

## 2018-06-25 ENCOUNTER — Encounter (INDEPENDENT_AMBULATORY_CARE_PROVIDER_SITE_OTHER): Payer: Self-pay | Admitting: Orthopaedic Surgery

## 2018-06-25 DIAGNOSIS — R600 Localized edema: Secondary | ICD-10-CM

## 2018-06-25 DIAGNOSIS — Z7984 Long term (current) use of oral hypoglycemic drugs: Secondary | ICD-10-CM | POA: Diagnosis not present

## 2018-06-25 DIAGNOSIS — G629 Polyneuropathy, unspecified: Secondary | ICD-10-CM | POA: Diagnosis not present

## 2018-06-25 DIAGNOSIS — E119 Type 2 diabetes mellitus without complications: Secondary | ICD-10-CM | POA: Diagnosis not present

## 2018-06-25 DIAGNOSIS — I714 Abdominal aortic aneurysm, without rupture: Secondary | ICD-10-CM | POA: Diagnosis not present

## 2018-06-25 DIAGNOSIS — Z86718 Personal history of other venous thrombosis and embolism: Secondary | ICD-10-CM | POA: Diagnosis not present

## 2018-06-25 DIAGNOSIS — Z85038 Personal history of other malignant neoplasm of large intestine: Secondary | ICD-10-CM | POA: Diagnosis not present

## 2018-06-25 DIAGNOSIS — Z79899 Other long term (current) drug therapy: Secondary | ICD-10-CM | POA: Diagnosis not present

## 2018-06-25 DIAGNOSIS — K769 Liver disease, unspecified: Secondary | ICD-10-CM | POA: Diagnosis not present

## 2018-06-25 DIAGNOSIS — E785 Hyperlipidemia, unspecified: Secondary | ICD-10-CM | POA: Diagnosis not present

## 2018-06-25 DIAGNOSIS — K219 Gastro-esophageal reflux disease without esophagitis: Secondary | ICD-10-CM | POA: Diagnosis not present

## 2018-06-25 DIAGNOSIS — R918 Other nonspecific abnormal finding of lung field: Secondary | ICD-10-CM | POA: Diagnosis not present

## 2018-06-25 DIAGNOSIS — M129 Arthropathy, unspecified: Secondary | ICD-10-CM | POA: Diagnosis not present

## 2018-06-25 DIAGNOSIS — Z9221 Personal history of antineoplastic chemotherapy: Secondary | ICD-10-CM | POA: Diagnosis not present

## 2018-06-25 DIAGNOSIS — I10 Essential (primary) hypertension: Secondary | ICD-10-CM | POA: Diagnosis not present

## 2018-06-25 NOTE — Progress Notes (Signed)
Office Visit Note   Patient: Jonathan Vargas           Date of Birth: [birth year]           MRN: [id number] Visit Date: 06/25/2018              Requested by: Jonathan Cha, MD [address] [place], [place] [address] PCP: Jonathan Cha, MD   Assessment & Plan: Visit Diagnoses:  1. Bilateral lower extremity edema     Plan: Impression is bilateral lower extremity edema.  Patient has not had recent surgery but did have a recent trip to [place].  He is on Coumadin which would make him less likely to have a DVT although with recent worsening I feel that it is necessary to rule out bilateral DVTs in his lower legs.  If the studies are negative I have instructed him to consult his PCP for further work-up.  Questions encouraged and answered.  Follow-up as needed.  We will notify the patient of the Doppler results when they become available.  Follow-Up Instructions: Return if symptoms worsen or fail to improve.   Orders:  No orders of the defined types were placed in this encounter.  No orders of the defined types were placed in this encounter.     Procedures: No procedures performed   Clinical Data: No additional findings.   Subjective: Chief Complaint  Patient presents with  . Left Ankle - Pain, Edema  . Right Ankle - Pain, Edema    Jonathan Vargas is a very pleasant 73 year old gentleman who I have been seeing recently who comes in with a new issue of bilateral lower extremity swelling worse on the left leg.  He states that he traveled to [place] in December and noticed that the swelling was made worse.  He does take chronic Coumadin.  He denies any chest pain or shortness of breath.  He denies any numbness and tingling.  He denies any recent injuries or surgeries.   Review of Systems  Constitutional: Negative.   All other systems reviewed and are negative.    Objective: Vital Signs: There were no vitals taken for this visit.  Physical Exam Vitals  signs and nursing note reviewed.  Constitutional:      Appearance: He is well-developed.  Pulmonary:     Effort: Pulmonary effort is normal.  Abdominal:     Palpations: Abdomen is soft.  Skin:    General: Skin is warm.  Neurological:     Mental Status: He is alert and oriented to person, place, and time.  Psychiatric:        Behavior: Behavior normal.        Thought Content: Thought content normal.        Judgment: Judgment normal.     Ortho Exam Bilateral lower extremity exam shows 2+ pitting edema worse on the left.  There is no real calf tenderness on either side.  He has strong dorsalis pedis pulses bilaterally.  No evidence of varicose or spider veins. Specialty Comments:  No specialty comments available.  Imaging: No results found.   PMFS History: Patient Active Problem List   Diagnosis Date Noted  . It band syndrome, left 01/09/2018  . Malignant neoplasm of prostate ([place]) 10/22/2017  . Chronic pain of left knee 09/30/2017  . Personal history of colonic polyps 09/26/2016  . Family history of colon cancer 09/26/2016  . Obesity 03/29/2015  . Tobacco use 03/29/2015  . 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  Depressive disorder, not elsewhere classified 12/13/2012  . Obstructive sleep apnea 12/13/2012  . Esophageal reflux 12/13/2012  . Impaired fasting glucose 12/13/2012  . Tobacco use disorder 12/13/2012  . 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 Gout, unspecified 12/13/2012  . Long term current use of anticoagulant therapy 12/13/2012  . Atrial fibrillation ([place]) 01/30/2011  . Bradycardia 01/30/2011  . Sleep apnea 01/30/2011  . Atrial flutter ([place]) 01/25/2011  . Essential hypertension, benign 01/25/2011  . Pure hypercholesterolemia 01/25/2011   Past Medical History:  Diagnosis Date  . Acid reflux   . Arthritis   . ASD (atrial septal defect)   . Asthma with allergic rhinitis and status asthmaticus    NO RECENT FLARE UPS  . Atrial fib/flutter,  transient   . Cancer ([place])    skin  . Chronic anticoagulation   . Depression   . Dysrhythmia    ATRIAL FIB  . ENT complaint    Dr. Erik Obey  . Erectile dysfunction   . Fatty liver   . FHx: allergies   . Gout    , Possible, Right Knee x2 ; intermittent milder episodes of pain in hte first MTP - uric acid 7.7- no treatment so far.  . Hemochromatosis    DOES TWICE YEAR  . History of kidney stones    SMALL IN RIGHT KIDNEY  . Hypercholesteremia   . Hyperlipemia    PT DENIES  . Hypertension   . Hypogonadism male   . Impaired fasting glucose   . 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  . Pneumonia    " walking PNA > 40 years ago"  . Prostate cancer ([place]) [place] 2018  . Seasonal allergies   . Sleep apnea    wears CPAP ( set at 4-11)  . Trigger finger, left    , fourth finger    Family History  Problem Relation Age of Onset  . Stroke Father   . Diabetes type II Father   . Colon cancer Father   . Heart disease Mother        with pacemaker  . Breast cancer Sister   . Diabetes type II Maternal Grandfather   . Leukemia Paternal Uncle   . Prostate cancer Other   . Pancreatic cancer Neg Hx     Past Surgical History:  Procedure Laterality Date  . CARDIOVERSION N/A 05/29/2016   Procedure: CARDIOVERSION;  Surgeon: [name], MD;  Location: [facility];  Service: Cardiovascular;  Laterality: N/A;  . CATARACT EXTRACTION W/ INTRAOCULAR LENS  IMPLANT, BILATERAL  2016  . COLONOSCOPY WITH PROPOFOL N/A 09/26/2016   Procedure: COLONOSCOPY WITH PROPOFOL;  Surgeon: Wilford Corner, MD;  Location: [facility];  Service: Endoscopy;  Laterality: N/A;  . CYSTOSCOPY  01/23/2018   Procedure: CYSTOSCOPY FLEXIBLE;  Surgeon: Franchot Gallo, MD;  [facility];  Service: Urology;;  no seeds found in bladder  . ESOPHAGOGASTRODUODENOSCOPY (EGD) WITH PROPOFOL N/A 09/26/2016   Procedure:  ESOPHAGOGASTRODUODENOSCOPY (EGD) WITH PROPOFOL;  Surgeon: Wilford Corner, MD;  Location: [facility];  Service: Endoscopy;  Laterality: N/A;  . LIVER BIOPSY  MANY YRS AGO  . NASAL SINUS SURGERY  1997  . RADIOACTIVE SEED IMPLANT N/A 01/23/2018   Procedure: RADIOACTIVE SEED IMPLANT/BRACHYTHERAPY IMPLANT;  Surgeon: Franchot Gallo, MD;  Location: [facility];  Service: Urology;  Laterality: N/A;   69 seeds implanted  . SPACE OAR INSTILLATION N/A 01/23/2018   Procedure: SPACE OAR INSTILLATION;  Surgeon: Diona Fanti,  Annie Main, MD;  Location: [facility];  Service: Urology;  Laterality: N/A;   Social History   Occupational History  . Occupation: retired  Tobacco Use  . Smoking status: Current Every Day Smoker    Packs/day: 0.25    Years: 50.00    Pack years: 12.50    Types: Cigarettes    Start date: 10/30/2013  . Smokeless tobacco: Former Systems developer    Types: Chew  . Tobacco comment: trying to quit 5 TO 6 CIGS PER DAY NOW  Substance and Sexual Activity  . Alcohol use: Yes    Alcohol/week: 0.0 standard drinks    Comment: 2- glasses of red wine per night  . Drug use: No  . Sexual activity: Not Currently

## 2018-06-26 ENCOUNTER — Ambulatory Visit (HOSPITAL_COMMUNITY)
Admission: RE | Admit: 2018-06-26 | Discharge: 2018-06-26 | Disposition: A | Discharge status: Home / Self Care | Payer: PPO | Source: Ambulatory Visit | Attending: Cardiology | Admitting: Cardiology

## 2018-06-26 DIAGNOSIS — Z6830 Body mass index (BMI) 30.0-30.9, adult: Secondary | ICD-10-CM | POA: Diagnosis not present

## 2018-06-26 DIAGNOSIS — Z79899 Other long term (current) drug therapy: Secondary | ICD-10-CM | POA: Diagnosis not present

## 2018-06-26 DIAGNOSIS — R600 Localized edema: Secondary | ICD-10-CM | POA: Diagnosis not present

## 2018-06-26 DIAGNOSIS — E6609 Other obesity due to excess calories: Secondary | ICD-10-CM | POA: Diagnosis not present

## 2018-06-26 DIAGNOSIS — Z7982 Long term (current) use of aspirin: Secondary | ICD-10-CM | POA: Diagnosis not present

## 2018-06-26 DIAGNOSIS — T461X5A Adverse effect of calcium-channel blockers, initial encounter: Secondary | ICD-10-CM | POA: Diagnosis not present

## 2018-06-26 DIAGNOSIS — R0602 Shortness of breath: Secondary | ICD-10-CM | POA: Diagnosis not present

## 2018-06-26 DIAGNOSIS — M069 Rheumatoid arthritis, unspecified: Secondary | ICD-10-CM | POA: Diagnosis not present

## 2018-06-26 DIAGNOSIS — T451X5A Adverse effect of antineoplastic and immunosuppressive drugs, initial encounter: Secondary | ICD-10-CM | POA: Diagnosis not present

## 2018-06-26 DIAGNOSIS — C562 Malignant neoplasm of left ovary: Secondary | ICD-10-CM | POA: Diagnosis not present

## 2018-06-26 DIAGNOSIS — D6481 Anemia due to antineoplastic chemotherapy: Secondary | ICD-10-CM | POA: Diagnosis not present

## 2018-06-26 DIAGNOSIS — R5383 Other fatigue: Secondary | ICD-10-CM | POA: Diagnosis not present

## 2018-06-26 NOTE — Progress Notes (Signed)
Normal doppler.  Needs to see PCP for work up of pitting edema.

## 2018-07-16 DIAGNOSIS — G4733 Obstructive sleep apnea (adult) (pediatric): Secondary | ICD-10-CM | POA: Diagnosis not present

## 2018-07-23 ENCOUNTER — Other Ambulatory Visit: Payer: Self-pay

## 2018-07-23 ENCOUNTER — Ambulatory Visit (INDEPENDENT_AMBULATORY_CARE_PROVIDER_SITE_OTHER): Payer: PPO | Admitting: Family Medicine

## 2018-07-23 ENCOUNTER — Ambulatory Visit (INDEPENDENT_AMBULATORY_CARE_PROVIDER_SITE_OTHER): Payer: PPO

## 2018-07-23 ENCOUNTER — Encounter: Payer: Self-pay | Admitting: Family Medicine

## 2018-07-23 DIAGNOSIS — M79604 Pain in right leg: Secondary | ICD-10-CM

## 2018-07-23 DIAGNOSIS — Z79899 Other long term (current) drug therapy: Secondary | ICD-10-CM | POA: Diagnosis not present

## 2018-07-23 DIAGNOSIS — M109 Gout, unspecified: Secondary | ICD-10-CM

## 2018-07-23 DIAGNOSIS — M1A09X0 Idiopathic chronic gout, multiple sites, without tophus (tophi): Secondary | ICD-10-CM | POA: Diagnosis not present

## 2018-07-23 NOTE — Progress Notes (Signed)
Office Visit Note   Patient: Jonathan Vargas           Date of Birth: [birth year]           MRN: [id number] Visit Date: 07/23/2018 Requested by: Leeroy Cha, MD [address] [place], [place] [address] PCP: Leeroy Cha, MD  Subjective: Chief Complaint  Patient presents with  . Right Leg - Pain    Tightness in the muscles of the leg, upper to knee - mainly posterior x 4-6 weeks, gradual onset. Knee "went into a spasm" 2 days ago. Soreness medial knee.    HPI: He is here with right knee pain.  Symptoms started about 6 weeks ago, gradual onset of posterior knee pain.  He first tried colchicine for possible gout, but it did not help.  He then used Voltaren gel with some relief.  In the past couple days he has had more severe pain on the medial side of his knee which felt similar to when he had a stress fracture in his left knee.  He has been walking for exercise outside, unable to go to the gym due to COVID-19.  He thinks he has been walking awkwardly which could be contributing to his pain.               ROS: Denies fevers or chills.  All other systems were reviewed and are negative.  Objective: Vital Signs: There were no vitals taken for this visit.  Physical Exam:  General:  Alert and oriented, in no acute distress. Pulm:  Breathing unlabored. Psy:  Normal mood, congruent affect. Skin: No visible rash or erythema. Right knee: 2+ effusion with no warmth or erythema.  Ligaments feel stable.  Exquisitely tender over the medial femoral condyle.  No significant joint line tenderness.  Imaging: X-rays right knee: Well-preserved joint spaces.  He does have some early patellofemoral spurring.  On the lateral view the femoral condyle has a slight step-off, etiology uncertain.  Musculoskeletal ultrasound: I briefly imaged the area of maximum pain but did not record the images.  He has hyperemia on power Doppler imaging of the synovium.  No definite bony  abnormality seen.   Assessment & Plan: 1.  Right knee pain with effusion, possibly gout but he does have a history of osteoporosis and stress fracture in the other knee. -I am concerned about injecting with steroid if there is possibility of stress fracture.  We will proceed with MRI scan to further evaluate.  If negative for stress fracture we will inject with cortisone.  If stress fracture is present we will modify his activities accordingly.     Procedures: No procedures performed  No notes on file     PMFS History: Patient Active Problem List   Diagnosis Date Noted  . It band syndrome, left 01/09/2018  . Malignant neoplasm of prostate ([place]) 10/22/2017  . Chronic pain of left knee 09/30/2017  . Personal history of colonic polyps 09/26/2016  . Family history of colon cancer 09/26/2016  . Obesity 03/29/2015  . Tobacco use 03/29/2015  . 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 Depressive disorder, not elsewhere classified 12/13/2012  . Obstructive sleep apnea 12/13/2012  . Esophageal reflux 12/13/2012  . Impaired fasting glucose 12/13/2012  . Tobacco use disorder 12/13/2012  . 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 Gout, unspecified 12/13/2012  . Long term current use of anticoagulant therapy 12/13/2012  . Atrial fibrillation ([place]) 01/30/2011  . Bradycardia 01/30/2011  .  Sleep apnea 01/30/2011  . Atrial flutter ([place]) 01/25/2011  . Essential hypertension, benign 01/25/2011  . Pure hypercholesterolemia 01/25/2011   Past Medical History:  Diagnosis Date  . Acid reflux   . Arthritis   . ASD (atrial septal defect)   . Asthma with allergic rhinitis and status asthmaticus    NO RECENT FLARE UPS  . Atrial fib/flutter, transient   . Cancer ([place])    skin  . Chronic anticoagulation   . Depression   . Dysrhythmia    ATRIAL FIB  . ENT complaint    Dr. Erik Obey  . Erectile dysfunction   . Fatty liver   . FHx: allergies   . Gout    , Possible, Right Knee x2 ;  intermittent milder episodes of pain in hte first MTP - uric acid 7.7- no treatment so far.  . Hemochromatosis    DOES TWICE YEAR  . History of kidney stones    SMALL IN RIGHT KIDNEY  . Hypercholesteremia   . Hyperlipemia    PT DENIES  . Hypertension   . Hypogonadism male   . Impaired fasting glucose   . 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  . Pneumonia    " walking PNA > 40 years ago"  . Prostate cancer ([place]) [place] 2018  . Seasonal allergies   . Sleep apnea    wears CPAP ( set at 4-11)  . Trigger finger, left    , fourth finger    Family History  Problem Relation Age of Onset  . Stroke Father   . Diabetes type II Father   . Colon cancer Father   . Heart disease Mother        with pacemaker  . Breast cancer Sister   . Diabetes type II Maternal Grandfather   . Leukemia Paternal Uncle   . Prostate cancer Other   . Pancreatic cancer Neg Hx     Past Surgical History:  Procedure Laterality Date  . CARDIOVERSION N/A 05/29/2016   Procedure: CARDIOVERSION;  Surgeon: [name], MD;  Location: [facility];  Service: Cardiovascular;  Laterality: N/A;  . CATARACT EXTRACTION W/ INTRAOCULAR LENS  IMPLANT, BILATERAL  2016  . COLONOSCOPY WITH PROPOFOL N/A 09/26/2016   Procedure: COLONOSCOPY WITH PROPOFOL;  Surgeon: Wilford Corner, MD;  Location: [facility];  Service: Endoscopy;  Laterality: N/A;  . CYSTOSCOPY  01/23/2018   Procedure: CYSTOSCOPY FLEXIBLE;  Surgeon: Franchot Gallo, MD;  Location: [facility];  Service: Urology;;  no seeds found in bladder  . ESOPHAGOGASTRODUODENOSCOPY (EGD) WITH PROPOFOL N/A 09/26/2016   Procedure: ESOPHAGOGASTRODUODENOSCOPY (EGD) WITH PROPOFOL;  Surgeon: Wilford Corner, MD;  Location: [facility];  Service: Endoscopy;  Laterality: N/A;  . LIVER BIOPSY  MANY YRS AGO  . NASAL SINUS SURGERY  1997  . RADIOACTIVE SEED IMPLANT N/A 01/23/2018   Procedure:  RADIOACTIVE SEED IMPLANT/BRACHYTHERAPY IMPLANT;  Surgeon: Franchot Gallo, MD;  Location: [facility];  Service: Urology;  Laterality: N/A;   69 seeds implanted  . SPACE OAR INSTILLATION N/A 01/23/2018   Procedure: SPACE OAR INSTILLATION;  Surgeon: Franchot Gallo, MD;  Location: [facility];  Service: Urology;  Laterality: N/A;   Social History   Occupational History  . Occupation: retired  Tobacco Use  . Smoking status: Current Every Day Smoker    Packs/day: 0.25    Years: 50.00    Pack years: 12.50    Types: Cigarettes    Start date:  10/30/2013  . Smokeless tobacco: Former Systems developer    Types: Chew  . Tobacco comment: trying to quit 5 TO 6 CIGS PER DAY NOW  Substance and Sexual Activity  . Alcohol use: Yes    Alcohol/week: 0.0 standard drinks    Comment: 2- glasses of red wine per night  . Drug use: No  . Sexual activity: Not Currently

## 2018-07-29 ENCOUNTER — Other Ambulatory Visit: Payer: Self-pay

## 2018-07-29 ENCOUNTER — Ambulatory Visit
Admission: RE | Admit: 2018-07-29 | Discharge: 2018-07-29 | Disposition: A | Discharge status: Home / Self Care | Payer: PPO | Source: Ambulatory Visit | Attending: Family Medicine | Admitting: Family Medicine

## 2018-07-29 DIAGNOSIS — M79604 Pain in right leg: Secondary | ICD-10-CM

## 2018-07-29 DIAGNOSIS — M25561 Pain in right knee: Secondary | ICD-10-CM | POA: Diagnosis not present

## 2018-07-31 ENCOUNTER — Telehealth: Payer: Self-pay | Admitting: Family Medicine

## 2018-07-31 NOTE — Telephone Encounter (Signed)
I advised the patient of results and instructions.

## 2018-07-31 NOTE — Telephone Encounter (Signed)
Knee MRI scan shows a stress fracture on the medial femoral condyle.  This would explain his recent pain.  In order to get this to heal, he will need to modify his activities for about 6 to 8 weeks avoiding stressful activities on the knee.  He could exercise by doing low impact activities such as riding a bike or swimming once available.  He needs to use pain as his guide.  I would recommend against cortisone injection due to the presence of stress fracture.

## 2018-08-03 DIAGNOSIS — H01114 Allergic dermatitis of left upper eyelid: Secondary | ICD-10-CM | POA: Diagnosis not present

## 2018-08-03 DIAGNOSIS — H01112 Allergic dermatitis of right lower eyelid: Secondary | ICD-10-CM | POA: Diagnosis not present

## 2018-08-03 DIAGNOSIS — H01115 Allergic dermatitis of left lower eyelid: Secondary | ICD-10-CM | POA: Diagnosis not present

## 2018-08-03 DIAGNOSIS — H01111 Allergic dermatitis of right upper eyelid: Secondary | ICD-10-CM | POA: Diagnosis not present

## 2018-08-07 ENCOUNTER — Telehealth: Payer: Self-pay

## 2018-08-07 ENCOUNTER — Encounter: Payer: PPO | Attending: Internal Medicine

## 2018-08-07 NOTE — Telephone Encounter (Signed)

## 2018-08-11 ENCOUNTER — Other Ambulatory Visit: Payer: Self-pay

## 2018-08-11 ENCOUNTER — Ambulatory Visit (INDEPENDENT_AMBULATORY_CARE_PROVIDER_SITE_OTHER): Payer: PPO | Admitting: *Deleted

## 2018-08-11 DIAGNOSIS — H524 Presbyopia: Secondary | ICD-10-CM | POA: Diagnosis not present

## 2018-08-11 DIAGNOSIS — Z5181 Encounter for therapeutic drug level monitoring: Secondary | ICD-10-CM

## 2018-08-11 DIAGNOSIS — H01115 Allergic dermatitis of left lower eyelid: Secondary | ICD-10-CM | POA: Diagnosis not present

## 2018-08-11 DIAGNOSIS — I480 Paroxysmal atrial fibrillation: Secondary | ICD-10-CM | POA: Diagnosis not present

## 2018-08-11 DIAGNOSIS — H01114 Allergic dermatitis of left upper eyelid: Secondary | ICD-10-CM | POA: Diagnosis not present

## 2018-08-11 DIAGNOSIS — H01111 Allergic dermatitis of right upper eyelid: Secondary | ICD-10-CM | POA: Diagnosis not present

## 2018-08-11 DIAGNOSIS — H01112 Allergic dermatitis of right lower eyelid: Secondary | ICD-10-CM | POA: Diagnosis not present

## 2018-08-11 LAB — POCT INR: INR: 2.3 (ref 2.0–3.0)

## 2018-08-11 NOTE — Patient Instructions (Signed)
Description   Spoke with pt and instructed pt to continue on same dosage 2 tablets daily except 2.5 tablets on Mondays, Wednesdays and Fridays. Recheck in 8 weeks.  Call if placed on any new medications [phone].

## 2018-08-12 ENCOUNTER — Ambulatory Visit (HOSPITAL_BASED_OUTPATIENT_CLINIC_OR_DEPARTMENT_OTHER): Payer: PPO | Admitting: Registered"

## 2018-08-12 ENCOUNTER — Encounter: Payer: Self-pay | Admitting: Registered"

## 2018-08-12 DIAGNOSIS — R7303 Prediabetes: Secondary | ICD-10-CM

## 2018-08-12 NOTE — Progress Notes (Signed)
On 08/12/18 pt completed a session of the Diabetes Prevention Program course. By the end of this session patients are able to complete the following objectives:   Learning Objectives:  Define fiber and describe the difference between insoluble and soluble fiber   List foods that are good sources of fiber  Explain the health benefits of fiber   Describe ways to increase volume of meals and snacks while staying within fat goal.   Goals:   Record weight taken outside of class.   Track foods and beverages eaten each day in the "Food and Activity Tracker," including calories and fat grams for each item.    Track activity type, minutes you were active, and distance you reached each day in the "Food and Activity Tracker."   Follow-Up Plan:  Attend next session.   Bring completed "Food and Activity Trackers" to next session to be reviewed by Lifestyle Coach.

## 2018-08-16 DIAGNOSIS — G4733 Obstructive sleep apnea (adult) (pediatric): Secondary | ICD-10-CM | POA: Diagnosis not present

## 2018-08-21 ENCOUNTER — Encounter: Payer: PPO | Attending: Internal Medicine | Admitting: Registered"

## 2018-08-21 DIAGNOSIS — R7303 Prediabetes: Secondary | ICD-10-CM

## 2018-08-26 ENCOUNTER — Other Ambulatory Visit: Payer: Self-pay | Admitting: Cardiology

## 2018-08-26 MED FILL — IRBESARTAN 150 MG TABLET: 150 | 90 days supply | Qty: 90 | Fill #0

## 2018-08-27 ENCOUNTER — Encounter: Payer: Self-pay | Admitting: Registered"

## 2018-08-27 NOTE — Progress Notes (Signed)
On 08/21/2018 patient completed a session of the Diabetes Prevention Program course. By the end of this session patients are able to complete the following objectives:   Learning Objectives:  Counter self-defeating thoughts with positive self-statements  Define assertiveness.   List examples of ways to practice assertiveness.   Goals:   Record weight taken outside of class.   Track foods and beverages eaten each day in the "Food and Activity Tracker," including calories and fat grams for each item.    Track activity type, minutes you were active, and distance you reached each day in the "Food and Activity Tracker."   Follow-Up Plan:  Attend next session.   Bring completed "Food and Activity Trackers" to next session to be reviewed by Lifestyle Coach.

## 2018-09-01 ENCOUNTER — Telehealth: Payer: Self-pay | Admitting: *Deleted

## 2018-09-01 NOTE — Progress Notes (Signed)
Telehealth Visit     Virtual Visit via Video Note   This visit type was conducted due to national recommendations for restrictions regarding the COVID-19 Pandemic (e.g. social distancing) in an effort to limit this patient's exposure and mitigate transmission in our community.  Due to his co-morbid illnesses, this patient is at least at moderate risk for complications without adequate follow up.  This format is felt to be most appropriate for this patient at this time.  All issues noted in this document were discussed and addressed.  A limited physical exam was performed with this format.  Please refer to the patient's chart for his consent to telehealth for [facility].   Evaluation Performed:  Follow-up visit  This visit type was conducted due to national recommendations for restrictions regarding the COVID-19 Pandemic (e.g. social distancing).  This format is felt to be most appropriate for this patient at this time.  All issues noted in this document were discussed and addressed.  No physical exam was performed (except for noted visual exam findings with Video Visits).  Please refer to the patient's chart (MyChart message for video visits and phone note for telephone visits) for the patient's consent to telehealth for [facility].  Date:  09/02/2018   ID:  Jonathan Vargas, DOB 20-Dec-1945, MRN [id number]  Patient Location:  Home  Provider location:   Home  PCP:  Leeroy Cha, MD  Cardiologist:  Servando Snare Candee Furbish, MD  Electrophysiologist:  Allred  Chief Complaint:  Follow up  History of Present Illness:    Jonathan Vargas is a 73 y.o. adult who presents via audio/video conferencing for a telehealth visit today.  Seen for Dr. Marlou Porch &Allred.  [name]".  He has a history ofatrial fibrillation/flutter paroxysmal on antiarrhythmic and is onchronicwarfarin for his anticoagulation. Had stress test 09/01/12 that demonstrated a mild inferior wall defect,  possible diaphragmatic attenuation, overall low risk study with normal ejection fraction. Other issues include HTN, prior carotid artery dissection in 1997, ongoing tobacco abuse, hemachromatosis. Previous hospitalization in November 2012 demonstrated paroxysmal atrial flutter which converted after 4 hours of diltiazem. He had a postconversion pause of 8.2 seconds, felt flushed. Endorses chronic palpitations.   I saw him as a work in back in [place]- out of rhythm. Somewhat symptomatic. Got him cardioverted and back to see [name] for discussion of other options - but opted for his continued regimen. ARB was restarted for elevated BP. Patient elected to continue CCB therapy despite bradycardia.He was continued on his anticoagulation with coumadin.   Dr. Marlou Porch and I have followed him since. He has had intermittent issues with his BP. He has ongoing use of alcohol. He has had to have CCB eventually stopped due to presyncope.There was concern for post conversion pauses which has been documented in the past.  When seen last July - he was still smoking some. Still drinking. Was going to diabetic classes and was trying to work on his weight. Some atypical chest pain.  Needed clearance for GU surgery - we got his stress test updated - this turned out low risk. Limited mobility due to prior stress fracture in his leg. Last seen in February and he was doing well - continued to be successful with weight loss. Had had a short lived spell of atrial flutter. Had had seed implant as well.   The patient does not have symptoms concerning for COVID-19 infection (fever, chills, cough, or new shortness of breath).  Initially tried to see Doximity video - it did not work on his device - thus changed to telephone conversation. He has consented for this visit. He is doing ok. He has been using a mask. He has another stress fracture - that is starting to heal up - caught earlier than the last. No chest  pain. Not short of breath. Rhythm is ok. Medicines are going ok. His weight is down more. He has had help thru the diabetes educator - really focused on less fat in the diet. He keeps a food log as well. He is now under 200#. Still smokes about 6 cigarettes a day - he is thinking about stopping but not ready.   Past Medical History:  Diagnosis Date  . Acid reflux   . Arthritis   . ASD (atrial septal defect)   . Asthma with allergic rhinitis and status asthmaticus    NO RECENT FLARE UPS  . Atrial fib/flutter, transient   . Cancer ([place])    skin  . Chronic anticoagulation   . Depression   . Dysrhythmia    ATRIAL FIB  . ENT complaint    Dr. Erik Obey  . Erectile dysfunction   . Fatty liver   . FHx: allergies   . Gout    , Possible, Right Knee x2 ; intermittent milder episodes of pain in hte first MTP - uric acid 7.7- no treatment so far.  . Hemochromatosis    DOES TWICE YEAR  . History of kidney stones    SMALL IN RIGHT KIDNEY  . Hypercholesteremia   . Hyperlipemia    PT DENIES  . Hypertension   . Hypogonadism male   . Impaired fasting glucose   . 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  . Pneumonia    " walking PNA > 40 years ago"  . Prostate cancer ([place]) [place] 2018  . Seasonal allergies   . Sleep apnea    wears CPAP ( set at 4-11)  . Trigger finger, left    , fourth finger   Past Surgical History:  Procedure Laterality Date  . CARDIOVERSION N/A 05/29/2016   Procedure: CARDIOVERSION;  Surgeon: [name], MD;  Location: [facility];  Service: Cardiovascular;  Laterality: N/A;  . CATARACT EXTRACTION W/ INTRAOCULAR LENS  IMPLANT, BILATERAL  2016  . COLONOSCOPY WITH PROPOFOL N/A 09/26/2016   Procedure: COLONOSCOPY WITH PROPOFOL;  Surgeon: Wilford Corner, MD;  Location: [facility];  Service: Endoscopy;  Laterality: N/A;  . CYSTOSCOPY  01/23/2018   Procedure: CYSTOSCOPY FLEXIBLE;  Surgeon:  Franchot Gallo, MD;  Location: [facility];  Service: Urology;;  no seeds found in bladder  . ESOPHAGOGASTRODUODENOSCOPY (EGD) WITH PROPOFOL N/A 09/26/2016   Procedure: ESOPHAGOGASTRODUODENOSCOPY (EGD) WITH PROPOFOL;  Surgeon: Wilford Corner, MD;  Location: [facility];  Service: Endoscopy;  Laterality: N/A;  . LIVER BIOPSY  MANY YRS AGO  . NASAL SINUS SURGERY  1997  . RADIOACTIVE SEED IMPLANT N/A 01/23/2018   Procedure: RADIOACTIVE SEED IMPLANT/BRACHYTHERAPY IMPLANT;  Surgeon: Franchot Gallo, MD;  Location: [facility];  Service: Urology;  Laterality: N/A;   69 seeds implanted  . SPACE OAR INSTILLATION N/A 01/23/2018   Procedure: SPACE OAR INSTILLATION;  Surgeon: Franchot Gallo, MD;  Location[facility];  Service: Urology;  Laterality: N/A;     Current Meds  Medication Sig  . albuterol (PROVENTIL HFA;VENTOLIN HFA) 108 (90 Base) MCG/ACT inhaler Inhale 2 puffs into the lungs every 6 (  six) hours as needed for wheezing or shortness of breath.  . allopurinol (ZYLOPRIM) 300 MG tablet Take 300 mg by mouth every evening.   Marland Kitchen CALCIUM PO Take 200 mg by mouth 2 (two) times daily.  Marland Kitchen COLCRYS 0.6 MG tablet Take 0.6 mg by mouth 2 (two) times daily as needed (for gout).   Marland Kitchen diclofenac sodium (VOLTAREN) 1 % GEL Apply 1 application topically 4 (four) times daily as needed (for pain).   . fexofenadine (ALLEGRA) 60 MG tablet Take 60 mg by mouth daily as needed. For allergies  . flecainide (TAMBOCOR) 50 MG tablet TAKE 1 TABLET BY MOUTH 2 TIMES DAILY  . fluticasone (FLONASE) 50 MCG/ACT nasal spray Place 1 spray into the nose every evening.   . irbesartan (AVAPRO) 150 MG tablet TAKE 1 TABLET BY MOUTH DAILY.  Marland Kitchen MAGNESIUM PO Take 2 capsules by mouth every evening.   . Misc Natural Products (LUTEIN 20) CAPS Take 40 mg by mouth 2 (two) times daily.   . NON FORMULARY CPAP  . TURMERIC PO Take 1 capsule by mouth 2 (two) times daily.   . vitamin B-12  (CYANOCOBALAMIN) 1000 MCG tablet Take 1,000 mcg by mouth daily.  Marland Kitchen warfarin (COUMADIN) 5 MG tablet Take 2 to 2.5 tablets by mouth daily as directed by coumadin clinic     Allergies:   Clindamycin/lincomycin, Ciprofloxacin, and Amoxicillin   Social History   Tobacco Use  . Smoking status: Current Every Day Smoker    Packs/day: 0.25    Years: 50.00    Pack years: 12.50    Types: Cigarettes    Start date: 10/30/2013  . Smokeless tobacco: Former Systems developer    Types: Chew  . Tobacco comment: trying to quit 5 TO 6 CIGS PER DAY NOW  Substance Use Topics  . Alcohol use: Yes    Alcohol/week: 0.0 standard drinks    Comment: 2- glasses of red wine per night  . Drug use: No     Family Hx: The patient's family history includes Breast cancer in his sister; Colon cancer in his father; Diabetes type II in his father and maternal grandfather; Heart disease in his mother; Leukemia in his paternal uncle; Prostate cancer in an other family member; Stroke in his father. There is no history of Pancreatic cancer.  ROS:   Please see the history of present illness.   All other systems reviewed are negative.    Objective:    Vital Signs:  BP 126/69   Ht 6\' 2"  (1.88 m)   Wt 197 lb 6.4 oz (89.5 kg)   BMI 25.34 kg/m    Wt Readings from Last 3 Encounters:  09/02/18 197 lb 6.4 oz (89.5 kg)  05/22/18 211 lb 3.2 oz (95.8 kg)  05/06/18 211 lb (95.7 kg)    Alert adult in no acute distress. Not short of breath with conversation. BP is good. Weight is down 14 more pounds.    Labs/Other Tests and Data Reviewed:    Lab Results  Component Value Date   WBC 6.3 01/16/2018   HGB 13.1 05/06/2018   HCT 44.5 01/16/2018   PLT 264 01/16/2018   GLUCOSE 85 01/16/2018   ALT 16 01/16/2018   AST 19 01/16/2018   NA 135 01/16/2018   K 4.4 01/16/2018   CL 102 01/16/2018   CREATININE 0.94 01/16/2018   BUN 24 (H) 01/16/2018   CO2 25 01/16/2018   TSH 3.400 05/27/2016   INR 2.3 08/11/2018     Lab Results  Component Value Date   INR 2.3 08/11/2018   INR 2.8 06/15/2018   INR 2.3 05/18/2018    BNP (last 3 results) No results for input(s): BNP in the last 8760 hours.  ProBNP (last 3 results) No results for input(s): PROBNP in the last 8760 hours.    Prior CV studies:    The following studies were reviewed today:  Myoview Study Highlights 12/2017   Nuclear stress EF: 55%.  There was no ST segment deviation noted during stress.  The study is normal.  This is a low risk study.  The left ventricular ejection fraction is normal (55-65%).    Echo Study Conclusions 05/2016  - Left ventricle: The cavity size was normal. Wall thickness was increased in a pattern of mild LVH. Systolic function was normal. The estimated ejection fraction was in the range of 55% to 60%. Wall motion was normal; there were no regional wall motion abnormalities. Left ventricular diastolic function parameters were normal. - Left atrium: The atrium was moderately dilated. - Atrial septum: No defect or patent foramen ovale was identified.  Procedure: Electrical Cardioversion Indications:Atrial Flutter  Procedure Details Consent: Risks of procedure as well as the alternatives and risks of each were explained to the (patient/caregiver). Consent for procedure obtained. Time XLK:GMWNUUVO patient identification, verified procedure, site/side was marked, verified correct patient position, special equipment/implants available, medications/allergies/relevent history reviewed, required imaging and test results available. Performed  Patient placed on cardiac monitor, pulse oximetry, supplemental oxygen as necessary.  Sedation given: Propofol 70 mg Pacer pads placed anterior and posterior chest. Cardioverted 1time(s).  Cardioverted at 120J. Evaluation Findings: Post procedure EKG shows: NSR Complications: None Patient didtolerate procedure well.  [name], MD 05/29/2016,  1:53 PM   ASSESSMENT & PLAN:    1.PAF/flutter - last cardioversion in 2018. Has seen EPin the past- opted to continue with current regimen for his PAF. He has had chronic bradycardia - still does not have symptoms. He is reminded again about what to look for - PPM may be needed at some point. No indication for PPM at this time. There is still concern that he has had prior post conversion pauses.No changes made today. Recheck EKG on return.   2. HTN -BP looks good - no changes made today.   3. Chronic anticoagulation with coumadin -followed here - no problems noted.  4. Obesity -he is still attending his diabetic classes and continues to be successful with his weight loss.His weight continues to drop.   5. Smoking - he is thinking about stopping - but does not sound like he is ready.   6. COVID-19 Education: The signs and symptoms of COVID-19 were discussed with the patient and how to seek care for testing (follow up with PCP or arrange E-visit).  The importance of social distancing, staying at home, hand hygiene and wearing a mask when out in public were discussed today.  Patient Risk:   After full review of this patient's clinical status, I feel that they are at least moderate risk at this time.  Time:   Today, I have spent 8 minutes with the patient with telehealth technology discussing the above issues.     Medication Adjustments/Labs and Tests Ordered: Current medicines are reviewed at length with the patient today.  Concerns regarding medicines are outlined above.   Tests Ordered: No orders of the defined types were placed in this encounter.   Medication Changes: No orders of the defined types were placed in this encounter.   Disposition:  FU with me in 3 to 4 months. Overall he is felt to be doing well at this time.   Patient is agreeable to this plan and will call if any problems develop in the interim.   Amie Critchley, NP  09/02/2018 11:08 AM     [place]

## 2018-09-01 NOTE — Telephone Encounter (Signed)
Virtual Visit Pre-Appointment Phone Call  "(Name), I am calling you today to discuss your upcoming appointment. We are currently trying to limit exposure to the virus that causes COVID-19 by seeing patients at home rather than in the office."  1. "What is the BEST phone number to call the day of the visit?" - include this in appointment notes  2. "Do you have or have access to (through a family member/friend) a smartphone with video capability that we can use for your visit?" a. If yes - list this number in appt notes as "cell" (if different from BEST phone #) and list the appointment type as a VIDEO visit in appointment notes b. If no - list the appointment type as a PHONE visit in appointment notes  3. Confirm consent - "In the setting of the current Covid19 crisis, you are scheduled for a (phone or video) visit with your provider on (Wednesday, June 16) at (11:00 am ).  Just as we do with many in-office visits, in order for you to participate in this visit, we must obtain consent.  If you'd like, I can send this to your mychart (if signed up) or email for you to review.  Otherwise, I can obtain your verbal consent now.  All virtual visits are billed to your insurance company just like a normal visit would be.  By agreeing to a virtual visit, we'd like you to understand that the technology does not allow for your provider to perform an examination, and thus may limit your provider's ability to fully assess your condition. If your provider identifies any concerns that need to be evaluated in person, we will make arrangements to do so.  Finally, though the technology is pretty good, we cannot assure that it will always work on either your or our end, and in the setting of a video visit, we may have to convert it to a phone-only visit.  In either situation, we cannot ensure that we have a secure connection.  Are you willing to proceed?" STAFF: Did the patient verbally acknowledge consent to telehealth  visit? Document YES/NO here: YES.  4. Advise patient to be prepared - "Two hours prior to your appointment, go ahead and check your blood pressure, pulse, oxygen saturation, and your weight (if you have the equipment to check those) and write them all down. When your visit starts, your provider will ask you for this information. If you have an Apple Watch or Kardia device, please plan to have heart rate information ready on the day of your appointment. Please have a pen and paper handy nearby the day of the visit as well."  5. Give patient instructions for MyChart download to smartphone OR Doximity/Doxy.me as below if video visit (depending on what platform provider is using)  6. Inform patient they will receive a phone call 15 minutes prior to their appointment time (may be from unknown caller ID) so they should be prepared to answer    [place] has been deemed a candidate for a follow-up tele-health visit to limit community exposure during the Covid-19 pandemic. I spoke with the patient via phone to ensure availability of phone/video source, confirm preferred email & phone number, and discuss instructions and expectations.  I reminded Gwenyth Bender Ganesh to be prepared with any vital sign and/or heart rhythm information that could potentially be obtained via home monitoring, at the time of his visit. I reminded Roark Rufo Borkenhagen to expect a  phone call prior to his visit.  Danielle Avanell Shackleton 09/01/2018 9:13 AM   INSTRUCTIONS FOR DOWNLOADING THE MYCHART APP TO SMARTPHONE  - The patient must first make sure to have activated MyChart and know their login information - If Apple, go to App Store and type in MyChart in the search bar and download the app. If Android, ask patient to go to [employer] and type in [place] in the search bar and download the app. The app is free but as with any other app downloads, their phone may require them to verify saved payment  information or Apple/Android password.  - The patient will need to then log into the app with their MyChart username and password, and select [facility] as their healthcare provider to link the account. When it is time for your visit, go to the MyChart app, find appointments, and click Begin Video Visit. Be sure to Select Allow for your device to access the Microphone and Camera for your visit. You will then be connected, and your provider will be with you shortly.  **If they have any issues connecting, or need assistance please contact MyChart service desk [phone])**  **If using a computer, in order to ensure the best quality for their visit they will need to use either of the following Internet Browsers: [employer], or Google Chrome**  IF USING DOXIMITY or DOXY.ME - The patient will receive a link just prior to their visit by text.     FULL LENGTH CONSENT FOR TELE-HEALTH VISIT   I hereby voluntarily request, consent and authorize [facility] and its employed or contracted physicians, physician assistants, nurse practitioners or other licensed health care professionals (the Practitioner), to provide me with telemedicine health care services (the "Services") as deemed necessary by the treating Practitioner. I acknowledge and consent to receive the Services by the Practitioner via telemedicine. I understand that the telemedicine visit will involve communicating with the Practitioner through live audiovisual communication technology and the disclosure of certain medical information by electronic transmission. I acknowledge that I have been given the opportunity to request an in-person assessment or other available alternative prior to the telemedicine visit and am voluntarily participating in the telemedicine visit.  I understand that I have the right to withhold or withdraw my consent to the use of telemedicine in the course of my care at any time, without affecting my right  to future care or treatment, and that the Practitioner or I may terminate the telemedicine visit at any time. I understand that I have the right to inspect all information obtained and/or recorded in the course of the telemedicine visit and may receive copies of available information for a reasonable fee.  I understand that some of the potential risks of receiving the Services via telemedicine include:  Marland Kitchen Delay or interruption in medical evaluation due to technological equipment failure or disruption; . Information transmitted may not be sufficient (e.g. poor resolution of images) to allow for appropriate medical decision making by the Practitioner; and/or  . In rare instances, security protocols could fail, causing a breach of personal health information.  Furthermore, I acknowledge that it is my responsibility to provide information about my medical history, conditions and care that is complete and accurate to the best of my ability. I acknowledge that Practitioner's advice, recommendations, and/or decision may be based on factors not within their control, such as incomplete or inaccurate data provided by me or distortions of diagnostic images or specimens that may result  from electronic transmissions. I understand that the practice of medicine is not an exact science and that Practitioner makes no warranties or guarantees regarding treatment outcomes. I acknowledge that I will receive a copy of this consent concurrently upon execution via email to the email address I last provided but may also request a printed copy by calling the office of [facility].    I understand that my insurance will be billed for this visit.   I have read or had this consent read to me. . I understand the contents of this consent, which adequately explains the benefits and risks of the Services being provided via telemedicine.  . I have been provided ample opportunity to ask questions regarding this consent and the Services  and have had my questions answered to my satisfaction. . I give my informed consent for the services to be provided through the use of telemedicine in my medical care  By participating in this telemedicine visit I agree to the above.

## 2018-09-02 ENCOUNTER — Other Ambulatory Visit: Payer: Self-pay

## 2018-09-02 ENCOUNTER — Encounter: Payer: Self-pay | Admitting: Nurse Practitioner

## 2018-09-02 ENCOUNTER — Telehealth (INDEPENDENT_AMBULATORY_CARE_PROVIDER_SITE_OTHER): Payer: PPO | Admitting: Nurse Practitioner

## 2018-09-02 VITALS — BP 126/69 | Ht 74.0 in | Wt 197.4 lb

## 2018-09-02 DIAGNOSIS — Z7189 Other specified counseling: Secondary | ICD-10-CM

## 2018-09-02 DIAGNOSIS — I4819 Other persistent atrial fibrillation: Secondary | ICD-10-CM

## 2018-09-02 DIAGNOSIS — I10 Essential (primary) hypertension: Secondary | ICD-10-CM | POA: Diagnosis not present

## 2018-09-02 DIAGNOSIS — Z7901 Long term (current) use of anticoagulants: Secondary | ICD-10-CM

## 2018-09-02 DIAGNOSIS — Z79899 Other long term (current) drug therapy: Secondary | ICD-10-CM

## 2018-09-02 DIAGNOSIS — I480 Paroxysmal atrial fibrillation: Secondary | ICD-10-CM | POA: Diagnosis not present

## 2018-09-02 NOTE — Patient Instructions (Addendum)
After Visit Summary:  We will be checking the following labs today - NONE   Medication Instructions:    Continue with your current medicines.    If you need a refill on your cardiac medications before your next appointment, please call your pharmacy.     Testing/Procedures To Be Arranged:  N/A  Follow-Up:   See me in about 3 to 4 months.     At [facility], you and your health needs are our priority.  As part of our continuing mission to provide you with exceptional heart care, we have created designated Provider Care Teams.  These Care Teams include your primary Cardiologist (physician) and Advanced Practice Providers (APPs -  Physician Assistants and Nurse Practitioners) who all work together to provide you with the care you need, when you need it.  Special Instructions:  . Stay safe, stay home, wash your hands for at least 20 seconds and wear a mask when out in public.  . It was good to talk with you today.  Marland Kitchen Keep up the good work!   Call the [place] office at [phone] if you have any questions, problems or concerns.

## 2018-09-04 ENCOUNTER — Encounter (HOSPITAL_BASED_OUTPATIENT_CLINIC_OR_DEPARTMENT_OTHER): Payer: PPO | Admitting: Registered"

## 2018-09-04 ENCOUNTER — Encounter: Payer: Self-pay | Admitting: Registered"

## 2018-09-04 DIAGNOSIS — R7303 Prediabetes: Secondary | ICD-10-CM | POA: Diagnosis not present

## 2018-09-04 NOTE — Progress Notes (Signed)
On 09/04/18 patient completed the final session of the Diabetes Prevention Program course. By the end of this session patients are able to complete the following objectives:   Learning Objectives:  Reflect on lifestyle changes they have made since starting the DPP.   Set long-term goals to promote continued maintenance of lifestyle changes made during the program.   Goals:   Work toward reaching new long-term goals set during class.   Follow-Up Plan:  Contact Lifestyle Coach with questions/concerns PRN.

## 2018-09-14 DIAGNOSIS — I4891 Unspecified atrial fibrillation: Secondary | ICD-10-CM | POA: Diagnosis not present

## 2018-09-14 DIAGNOSIS — J4520 Mild intermittent asthma, uncomplicated: Secondary | ICD-10-CM | POA: Diagnosis not present

## 2018-09-14 DIAGNOSIS — I10 Essential (primary) hypertension: Secondary | ICD-10-CM | POA: Diagnosis not present

## 2018-09-14 DIAGNOSIS — F322 Major depressive disorder, single episode, severe without psychotic features: Secondary | ICD-10-CM | POA: Diagnosis not present

## 2018-09-14 DIAGNOSIS — E785 Hyperlipidemia, unspecified: Secondary | ICD-10-CM | POA: Diagnosis not present

## 2018-09-15 DIAGNOSIS — G4733 Obstructive sleep apnea (adult) (pediatric): Secondary | ICD-10-CM | POA: Diagnosis not present

## 2018-09-16 DIAGNOSIS — C61 Malignant neoplasm of prostate: Secondary | ICD-10-CM | POA: Diagnosis not present

## 2018-09-16 DIAGNOSIS — G4733 Obstructive sleep apnea (adult) (pediatric): Secondary | ICD-10-CM | POA: Diagnosis not present

## 2018-09-23 DIAGNOSIS — C61 Malignant neoplasm of prostate: Secondary | ICD-10-CM | POA: Diagnosis not present

## 2018-09-28 ENCOUNTER — Encounter: Payer: Self-pay | Admitting: Family Medicine

## 2018-09-28 ENCOUNTER — Ambulatory Visit (INDEPENDENT_AMBULATORY_CARE_PROVIDER_SITE_OTHER): Payer: PPO | Admitting: Family Medicine

## 2018-09-28 ENCOUNTER — Other Ambulatory Visit: Payer: Self-pay

## 2018-09-28 DIAGNOSIS — M25561 Pain in right knee: Secondary | ICD-10-CM | POA: Diagnosis not present

## 2018-09-28 DIAGNOSIS — M810 Age-related osteoporosis without current pathological fracture: Secondary | ICD-10-CM

## 2018-09-28 DIAGNOSIS — G8929 Other chronic pain: Secondary | ICD-10-CM | POA: Diagnosis not present

## 2018-09-28 NOTE — Progress Notes (Signed)
Office Visit Note   Patient: Jonathan Vargas           Date of Birth: 07-10-1945           MRN: [id number] Visit Date: 09/28/2018 Requested by: Leeroy Cha, MD [address] [place],  [place] [address] PCP: Leeroy Cha, MD  Subjective: Chief Complaint  Patient presents with  . Right Knee - Pain, Follow-up    Pain comes and goes - lateral aspect of knee. Feels like knee will "collapse" if he puts all his weight on the right leg - like with stepping up on a curb. Gets some relief with stretching the leg and lower back.    HPI: He is here with changing right knee pain.  2 months ago MRI scan showed stress fracture of the medial femoral condyle.  That pain seems to have resolved but about a month ago he started having lateral knee pain.  It is very difficult to stand up and put weight on his leg, but once he gets walking his pain improves a little bit.  No pain at rest.  No change in his activities to account for this pain.  He has not taken any medication on a regular basis.              ROS: No fever or chills.  All other systems were reviewed and are negative.  Objective: Vital Signs: There were no vitals taken for this visit.  Physical Exam:  General:  Alert and oriented, in no acute distress. Pulm:  Breathing unlabored. Psy:  Normal mood, congruent affect. Skin: No erythema or warmth. Right knee: 2+ effusion, full active extension and flexion of 130 degrees.  He is very tender to palpation of the lateral femoral condyle and moderately tender over the lateral joint line.  No pain or click with McMurray's.  No laxity with varus or valgus stress.  No further medial tenderness.  Imaging: None today.  Assessment & Plan: 1.  Right lateral knee pain, possibilities include new stress fracture of lateral femoral condyle versus gouty arthropathy.  Lateral meniscus looked good on MRI scan. -He will try Voltaren gel, colchicine.  If symptoms do not improve,  could consider aspiration and injection.  He wants to avoid cortisone if possible.     Procedures: No procedures performed  No notes on file     PMFS History: Patient Active Problem List   Diagnosis Date Noted  . It band syndrome, left 01/09/2018  . Malignant neoplasm of prostate ([place]) 10/22/2017  . Chronic pain of left knee 09/30/2017  . Personal history of colonic polyps 09/26/2016  . Family history of colon cancer 09/26/2016  . Obesity 03/29/2015  . Tobacco use 03/29/2015  . 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 Depressive disorder, not elsewhere classified 12/13/2012  . Obstructive sleep apnea 12/13/2012  . Esophageal reflux 12/13/2012  . Impaired fasting glucose 12/13/2012  . Tobacco use disorder 12/13/2012  . 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 Gout, unspecified 12/13/2012  . Long term current use of anticoagulant therapy 12/13/2012  . Atrial fibrillation ([place]) 01/30/2011  . Bradycardia 01/30/2011  . Sleep apnea 01/30/2011  . Atrial flutter ([place]) 01/25/2011  . Essential hypertension, benign 01/25/2011  . Pure hypercholesterolemia 01/25/2011   Past Medical History:  Diagnosis Date  . Acid reflux   . Arthritis   . ASD (atrial septal defect)   . Asthma with allergic rhinitis and status asthmaticus    NO RECENT  FLARE UPS  . Atrial fib/flutter, transient   . Cancer ([place])    skin  . Chronic anticoagulation   . Depression   . Dysrhythmia    ATRIAL FIB  . ENT complaint    Dr. Erik Obey  . Erectile dysfunction   . Fatty liver   . FHx: allergies   . Gout    , Possible, Right Knee x2 ; intermittent milder episodes of pain in hte first MTP - uric acid 7.7- no treatment so far.  . Hemochromatosis    DOES TWICE YEAR  . History of kidney stones    SMALL IN RIGHT KIDNEY  . Hypercholesteremia   . Hyperlipemia    PT DENIES  . Hypertension   . Hypogonadism male   . Impaired fasting glucose   . 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  . Pneumonia    " walking PNA > 40 years ago"  . Prostate cancer ([place]) [place] 2018  . Seasonal allergies   . Sleep apnea    wears CPAP ( set at 4-11)  . Trigger finger, left    , fourth finger    Family History  Problem Relation Age of Onset  . Stroke Father   . Diabetes type II Father   . Colon cancer Father   . Heart disease Mother        with pacemaker  . Breast cancer Sister   . Diabetes type II Maternal Grandfather   . Leukemia Paternal Uncle   . Prostate cancer Other   . Pancreatic cancer Neg Hx     Past Surgical History:  Procedure Laterality Date  . CARDIOVERSION N/A 05/29/2016   Procedure: CARDIOVERSION;  Surgeon: [name], MD;  Location: [facility];  Service: Cardiovascular;  Laterality: N/A;  . CATARACT EXTRACTION W/ INTRAOCULAR LENS  IMPLANT, BILATERAL  2016  . COLONOSCOPY WITH PROPOFOL N/A 09/26/2016   Procedure: COLONOSCOPY WITH PROPOFOL;  Surgeon: Wilford Corner, MD;  Location: [facility];  Service: Endoscopy;  Laterality: N/A;  . CYSTOSCOPY  01/23/2018   Procedure: CYSTOSCOPY FLEXIBLE;  Surgeon: Franchot Gallo, MD;  Location: [facility];  Service: Urology;;  no seeds found in bladder  . ESOPHAGOGASTRODUODENOSCOPY (EGD) WITH PROPOFOL N/A 09/26/2016   Procedure: ESOPHAGOGASTRODUODENOSCOPY (EGD) WITH PROPOFOL;  Surgeon: Wilford Corner, MD;  Location: [facility];  Service: Endoscopy;  Laterality: N/A;  . LIVER BIOPSY  MANY YRS AGO  . NASAL SINUS SURGERY  1997  . RADIOACTIVE SEED IMPLANT N/A 01/23/2018   Procedure: RADIOACTIVE SEED IMPLANT/BRACHYTHERAPY IMPLANT;  Surgeon: Franchot Gallo, MD;  Location: [facility];  Service: Urology;  Laterality: N/A;   69 seeds implanted  . SPACE OAR INSTILLATION N/A 01/23/2018   Procedure: SPACE OAR INSTILLATION;  Surgeon: Franchot Gallo, MD;  Location: [facility];  Service: Urology;  Laterality: N/A;    Social History   Occupational History  . Occupation: retired  Tobacco Use  . Smoking status: Current Every Day Smoker    Packs/day: 0.25    Years: 50.00    Pack years: 12.50    Types: Cigarettes    Start date: 10/30/2013  . Smokeless tobacco: Former Systems developer    Types: Chew  . Tobacco comment: trying to quit 5 TO 6 CIGS PER DAY NOW  Substance and Sexual Activity  . Alcohol use: Yes    Alcohol/week: 0.0 standard drinks    Comment: 2- glasses of red wine per night  . Drug use:  No  . Sexual activity: Not Currently

## 2018-10-01 ENCOUNTER — Encounter: Payer: Self-pay | Admitting: Family Medicine

## 2018-10-01 DIAGNOSIS — G8929 Other chronic pain: Secondary | ICD-10-CM

## 2018-10-02 ENCOUNTER — Ambulatory Visit: Payer: PPO

## 2018-10-02 ENCOUNTER — Telehealth: Payer: Self-pay | Admitting: Family Medicine

## 2018-10-02 DIAGNOSIS — G8929 Other chronic pain: Secondary | ICD-10-CM

## 2018-10-02 NOTE — Telephone Encounter (Signed)
Patient called and stated that you sent a MY CHART message to him.  He was calling to reply.  [phone]

## 2018-10-02 NOTE — Telephone Encounter (Signed)
I sent him a message back through [facility].

## 2018-10-05 ENCOUNTER — Telehealth: Payer: Self-pay

## 2018-10-05 NOTE — Telephone Encounter (Signed)

## 2018-10-05 NOTE — Telephone Encounter (Signed)
lmom for prescreen  

## 2018-10-06 ENCOUNTER — Ambulatory Visit (INDEPENDENT_AMBULATORY_CARE_PROVIDER_SITE_OTHER): Payer: PPO | Admitting: *Deleted

## 2018-10-06 ENCOUNTER — Other Ambulatory Visit: Payer: Self-pay | Admitting: Family Medicine

## 2018-10-06 ENCOUNTER — Other Ambulatory Visit: Payer: Self-pay

## 2018-10-06 DIAGNOSIS — M810 Age-related osteoporosis without current pathological fracture: Secondary | ICD-10-CM

## 2018-10-06 DIAGNOSIS — I480 Paroxysmal atrial fibrillation: Secondary | ICD-10-CM | POA: Diagnosis not present

## 2018-10-06 LAB — POCT INR: INR: 2.9 (ref 2.0–3.0)

## 2018-10-06 NOTE — Patient Instructions (Signed)
Description    Continue on same dosage 2 tablets daily except 2.5 tablets on Mondays, Wednesdays and Fridays. Recheck in 8 weeks.  Call if placed on any new medications [phone].

## 2018-10-16 DIAGNOSIS — G4733 Obstructive sleep apnea (adult) (pediatric): Secondary | ICD-10-CM | POA: Diagnosis not present

## 2018-10-20 ENCOUNTER — Other Ambulatory Visit: Payer: Self-pay | Admitting: Family Medicine

## 2018-10-20 DIAGNOSIS — Z13820 Encounter for screening for osteoporosis: Secondary | ICD-10-CM

## 2018-10-21 ENCOUNTER — Ambulatory Visit
Admission: RE | Admit: 2018-10-21 | Discharge: 2018-10-21 | Disposition: A | Discharge status: Home / Self Care | Payer: PPO | Source: Ambulatory Visit | Attending: Family Medicine | Admitting: Family Medicine

## 2018-10-21 ENCOUNTER — Telehealth: Payer: Self-pay | Admitting: Family Medicine

## 2018-10-21 ENCOUNTER — Other Ambulatory Visit: Payer: Self-pay

## 2018-10-21 DIAGNOSIS — Z13820 Encounter for screening for osteoporosis: Secondary | ICD-10-CM

## 2018-10-21 DIAGNOSIS — M85852 Other specified disorders of bone density and structure, left thigh: Secondary | ICD-10-CM | POA: Diagnosis not present

## 2018-10-21 NOTE — Telephone Encounter (Signed)
  Bone density test shows osteopenia in the hip with a T score of -2.1.  Spine density appears to be normal.

## 2018-11-03 ENCOUNTER — Telehealth: Payer: Self-pay | Admitting: *Deleted

## 2018-11-03 NOTE — Telephone Encounter (Signed)
Pt called and stated that he was being started on 100 mcg Vitamin K2. Spoke with Pharm D, who stated that it should not affect his INR, with the dose of 100 mcg but recommended pt come in earlier to get his INR checked. Rescheduled for pt to come in 11/17/2018.

## 2018-11-05 ENCOUNTER — Other Ambulatory Visit (HOSPITAL_COMMUNITY): Payer: Self-pay | Admitting: *Deleted

## 2018-11-06 ENCOUNTER — Ambulatory Visit (HOSPITAL_COMMUNITY)
Admission: RE | Admit: 2018-11-06 | Discharge: 2018-11-06 | Disposition: A | Discharge status: Home / Self Care | Payer: PPO | Source: Ambulatory Visit | Attending: Internal Medicine | Admitting: Internal Medicine

## 2018-11-06 ENCOUNTER — Other Ambulatory Visit: Payer: Self-pay

## 2018-11-06 LAB — POCT HEMOGLOBIN-HEMACUE: Hemoglobin: 12.5 g/dL — ABNORMAL LOW (ref 13.0–17.0)

## 2018-11-06 NOTE — Progress Notes (Signed)
Pt here for therapeutic phlebotomy per MD order. Hemocue 12.5. 500 ml blood removed from left AC. Pt tolerated well. VSS.

## 2018-11-12 ENCOUNTER — Encounter: Payer: Self-pay | Admitting: Family Medicine

## 2018-11-16 DIAGNOSIS — G4733 Obstructive sleep apnea (adult) (pediatric): Secondary | ICD-10-CM | POA: Diagnosis not present

## 2018-11-17 ENCOUNTER — Ambulatory Visit (INDEPENDENT_AMBULATORY_CARE_PROVIDER_SITE_OTHER): Payer: PPO | Admitting: *Deleted

## 2018-11-17 ENCOUNTER — Other Ambulatory Visit: Payer: Self-pay

## 2018-11-17 DIAGNOSIS — Z5181 Encounter for therapeutic drug level monitoring: Secondary | ICD-10-CM

## 2018-11-17 DIAGNOSIS — I480 Paroxysmal atrial fibrillation: Secondary | ICD-10-CM | POA: Diagnosis not present

## 2018-11-17 LAB — POCT INR: INR: 2.2 (ref 2.0–3.0)

## 2018-11-17 NOTE — Patient Instructions (Signed)
Description   Continue on same dosage 2 tablets daily except 2.5 tablets on Mondays, Wednesdays and Fridays. Recheck in 3 weeks with Lori's Appt (8 weeks). Call if placed on any new medications [phone].

## 2018-11-19 ENCOUNTER — Other Ambulatory Visit: Payer: PPO

## 2018-11-25 MED FILL — IRBESARTAN 150 MG TABLET: 150 | 90 days supply | Qty: 90 | Fill #1

## 2018-12-01 DIAGNOSIS — H43813 Vitreous degeneration, bilateral: Secondary | ICD-10-CM | POA: Diagnosis not present

## 2018-12-01 DIAGNOSIS — H353131 Nonexudative age-related macular degeneration, bilateral, early dry stage: Secondary | ICD-10-CM | POA: Diagnosis not present

## 2018-12-01 DIAGNOSIS — H4423 Degenerative myopia, bilateral: Secondary | ICD-10-CM | POA: Diagnosis not present

## 2018-12-03 NOTE — Progress Notes (Signed)
CARDIOLOGY OFFICE NOTE  Date:  12/07/2018    Jonathan Vargas Date of Birth: December 13, 1945 Medical Record [id number]  PCP:  Jonathan Cha, Jonathan Vargas  Cardiologist:  Jonathan Vargas  Chief Complaint  Patient presents with  . Follow-up    History of Present Illness: Jonathan Vargas is a 73 y.o. adult who presents today for a follow up visit. Seen for Jonathan Vargas &Allred.  [name]".  He has a history ofatrial fibrillation/flutter paroxysmal on antiarrhythmic and is onchronicwarfarin for his anticoagulation. Had stress test 09/01/12 that demonstrated a mild inferior wall defect, possible diaphragmatic attenuation, overall low risk study with normal ejection fraction. Other issues include HTN, prior carotid artery dissection in 1997, ongoing tobacco abuse, hemachromatosis. Previous hospitalization in November 2012 demonstrated paroxysmal atrial flutter which converted after 4 hours of diltiazem. He had a postconversion pause of 8.2 seconds, felt flushed. Endorses chronic palpitations.   I saw him as a work in back in [place]- out of rhythm. Somewhat symptomatic. Got him cardioverted and back to see Jonathan Vargas for discussion of other options - but opted for his continued regimen. ARB was restarted for elevated BP. Patient elected to continue CCB therapy despite bradycardia.He was continued on his anticoagulation with coumadin.   Jonathan Vargas and I have followed him since. He has had intermittent issues with his BP. He has ongoing use of alcohol. He has had to have CCB eventually stopped due to presyncope.There was concern for post conversion pauses which has been documented in the past.  He has actually made good progress over the past year with losing weight - was going to diabetic classes. Has had some atypical chest pain - has needed clearance for GU surgery and we have updated his stress test (October 2019) which was reassuring. Last seen in theoffice back  in February - was doing ok - we did a telehealth visit back in June - had had another stress fracture - still losing weight. He focuses on less fat in the his diet. Still smoking a few cigarettes and alcohol continues. Cardiac status has been ok.   The patient does not have symptoms concerning for COVID-19 infection (fever, chills, cough, or new shortness of breath).   Comes in today. Here alone. [facility] He notes this has helped his mental outlook considerably. Says staying at home was not good for him.  He had some palpitations earlier this morning - but noted his pulse was normal - but he could feel his "heart bouncing around".  He has had some spells of lightheadedness/dizziness - no passing out - he says he was concerned early on but now that [facility] has [facility] to the [facility], he thinks it "was more of an emotional/psych issue". Has not had since he has been [facility] to the [facility]. He does not typically smoke more than 6 cigs per day. He is trying to maintain his weight. He has no concerns. He is for labs with his PCP next month. No bleeding/excessive bruising.   Past Medical History:  Diagnosis Date  . Acid reflux   . Arthritis   . ASD (atrial septal defect)   . Asthma with allergic rhinitis and status asthmaticus    NO RECENT FLARE UPS  . Atrial fib/flutter, transient   . Cancer ([place])    skin  . Chronic anticoagulation   . Depression   . Dysrhythmia    ATRIAL FIB  . ENT complaint  Dr. Erik Vargas  . Erectile dysfunction   . Fatty liver   . FHx: allergies   . Gout    , Possible, Right Knee x2 ; intermittent milder episodes of pain in hte first MTP - uric acid 7.7- no treatment so far.  . Hemochromatosis    DOES TWICE YEAR  . History of kidney stones    SMALL IN RIGHT KIDNEY  . Hypercholesteremia   . Hyperlipemia    PT DENIES  . Hypertension   . Hypogonadism male   . Impaired fasting glucose   . 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  . Pneumonia    " walking PNA > 40 years ago"  . Prostate cancer ([place]) [place] 2018  . Seasonal allergies   . Sleep apnea    wears CPAP ( set at 4-11)  . Trigger finger, left    , fourth finger    Past Surgical History:  Procedure Laterality Date  . CARDIOVERSION N/A 05/29/2016   Procedure: CARDIOVERSION;  Surgeon: Jonathan Latch, Jonathan Vargas;  Location: [facility];  Service: Cardiovascular;  Laterality: N/A;  . CATARACT EXTRACTION W/ INTRAOCULAR LENS  IMPLANT, BILATERAL  2016  . COLONOSCOPY WITH PROPOFOL N/A 09/26/2016   Procedure: COLONOSCOPY WITH PROPOFOL;  Surgeon: Jonathan Corner, Jonathan Vargas;  Location: [facility];  Service: Endoscopy;  Laterality: N/A;  . CYSTOSCOPY  01/23/2018   Procedure: CYSTOSCOPY FLEXIBLE;  Surgeon: Jonathan Gallo, Jonathan Vargas;  [facility];  Service: Urology;;  no seeds found in bladder  . ESOPHAGOGASTRODUODENOSCOPY (EGD) WITH PROPOFOL N/A 09/26/2016   Procedure: ESOPHAGOGASTRODUODENOSCOPY (EGD) WITH PROPOFOL;  Surgeon: Jonathan Corner, Jonathan Vargas;  Location: [facility];  Service: Endoscopy;  Laterality: N/A;  . LIVER BIOPSY  MANY YRS AGO  . NASAL SINUS SURGERY  1997  . RADIOACTIVE SEED IMPLANT N/A 01/23/2018   Procedure: RADIOACTIVE SEED IMPLANT/BRACHYTHERAPY IMPLANT;  Surgeon: Jonathan Gallo, Jonathan Vargas;  [facility];  Service: Urology;  Laterality: N/A;   69 seeds implanted  . SPACE OAR INSTILLATION N/A 01/23/2018   Procedure: SPACE OAR INSTILLATION;  Surgeon: Jonathan Gallo, Jonathan Vargas;  Location: [facility];  Service: Urology;  Laterality: N/A;     Medications: Current Meds  Medication Sig  . albuterol (PROVENTIL HFA;VENTOLIN HFA) 108 (90 Base) MCG/ACT inhaler Inhale 2 puffs into the lungs every 6 (six) hours as needed for wheezing or shortness of breath.  . allopurinol (ZYLOPRIM) 300 MG tablet Take 300 mg by mouth every evening.   Marland Kitchen CALCIUM PO Take 200 mg by mouth 2 (two)  times daily.  . Cholecalciferol (VITAMIN D3) 125 MCG (5000 UT) CAPS Take 1 capsule by mouth daily.  Marland Kitchen COLCRYS 0.6 MG tablet Take 0.6 mg by mouth 2 (two) times daily as needed (for gout).   Marland Kitchen diclofenac sodium (VOLTAREN) 1 % GEL Apply 1 application topically 4 (four) times daily as needed (for pain).   . fexofenadine (ALLEGRA) 60 MG tablet Take 60 mg by mouth daily as needed. For allergies  . flecainide (TAMBOCOR) 50 MG tablet TAKE 1 TABLET BY MOUTH 2 TIMES DAILY  . fluticasone (FLONASE) 50 MCG/ACT nasal spray Place 1 spray into the nose every evening.   . irbesartan (AVAPRO) 150 MG tablet TAKE 1 TABLET BY MOUTH DAILY.  Marland Kitchen MAGNESIUM PO Take 2 capsules by mouth every evening.   . Menaquinone-7 (VITAMIN K2) 100 MCG CAPS Take by mouth daily.  . Misc Natural Products (LUTEIN 20) CAPS Take 40 mg by mouth  2 (two) times daily.   . NON FORMULARY CPAP  . TURMERIC PO Take 1 capsule by mouth 2 (two) times daily.   . vitamin B-12 (CYANOCOBALAMIN) 1000 MCG tablet Take 1,000 mcg by mouth daily.  Marland Kitchen warfarin (COUMADIN) 5 MG tablet Take 2 to 2.5 tablets by mouth daily as directed by coumadin clinic     Allergies: Allergies  Allergen Reactions  . Clindamycin/Lincomycin Diarrhea and Other (See Comments)    Stomach pain   . Ciprofloxacin Other (See Comments)    Joint pain  . Amoxicillin Rash    Social History: The patient  reports that he has been smoking cigarettes. He started smoking about 5 years ago. He has a 12.50 pack-year smoking history. He has quit using smokeless tobacco.  His smokeless tobacco use included chew. He reports current alcohol use. He reports that he does not use drugs.   Family History: The patient's family history includes Breast cancer in his sister; Colon cancer in his father; Diabetes type II in his father and maternal grandfather; Heart disease in his mother; Leukemia in his paternal uncle; Prostate cancer in an other family member; Stroke in his father.   Review of Systems:  Please see the history of present illness.   All other systems are reviewed and negative.   Physical Exam: VS:  BP 128/64   Pulse (!) 51   Ht 6\' 2"  (1.88 m)   Wt 207 lb (93.9 kg)   SpO2 99%   BMI 26.58 kg/m  .  BMI Body mass index is 26.58 kg/m.  Wt Readings from Last 3 Encounters:  12/07/18 207 lb (93.9 kg)  11/06/18 198 lb (89.8 kg)  09/02/18 197 lb 6.4 oz (89.5 kg)    General: Pleasant. Well developed, well nourished and in no acute distress.   HEENT: Normal.  Neck: Supple, no JVD, carotid bruits, or masses noted.  Cardiac: Regular rate and rhythm. HR is a little slow but stable. No edema.  Respiratory:  Lungs are clear to auscultation bilaterally with normal work of breathing.  GI: Soft and nontender.  MS: No deformity or atrophy. Gait and ROM intact.  Skin: Warm and dry. Color is normal.  Neuro:  Strength and sensation are intact and no gross focal deficits noted.  Psych: Alert, appropriate and with normal affect.   LABORATORY DATA:  EKG:  EKG is ordered today. This demonstrates sinus bradycardia - HR is up to 51 today with LVH - unchanged except for higher heart rate today.   Lab Results  Component Value Date   WBC 6.3 01/16/2018   HGB 12.5 (L) 11/06/2018   HCT 44.5 01/16/2018   PLT 264 01/16/2018   GLUCOSE 85 01/16/2018   ALT 16 01/16/2018   AST 19 01/16/2018   NA 135 01/16/2018   K 4.4 01/16/2018   CL 102 01/16/2018   CREATININE 0.94 01/16/2018   BUN 24 (H) 01/16/2018   CO2 25 01/16/2018   TSH 3.400 05/27/2016   INR 2.3 12/07/2018     Lab Results  Component Value Date   INR 2.3 12/07/2018   INR 2.2 11/17/2018   INR 2.9 10/06/2018     BNP (last 3 results) No results for input(s): BNP in the last 8760 hours.  ProBNP (last 3 results) No results for input(s): PROBNP in the last 8760 hours.   Other Studies Reviewed Today:  MyoviewStudy Highlights10/2019   Nuclear stress EF: 55%.  There was no ST segment deviation noted during stress.   The  study is normal.  This is a low risk study.  The left ventricular ejection fraction is normal (55-65%).    Echo Study Conclusions 05/2016  - Left ventricle: The cavity size was normal. Wall thickness was increased in a pattern of mild LVH. Systolic function was normal. The estimated ejection fraction was in the range of 55% to 60%. Wall motion was normal; there were no regional wall motion abnormalities. Left ventricular diastolic function parameters were normal. - Left atrium: The atrium was moderately dilated. - Atrial septum: No defect or patent foramen ovale was identified.  Procedure: Electrical Cardioversion Indications:Atrial Flutter  Procedure Details Consent: Risks of procedure as well as the alternatives and risks of each were explained to the (patient/caregiver). Consent for procedure obtained. Time [id number] patient identification, verified procedure, site/side was marked, verified correct patient position, special equipment/implants available, medications/allergies/relevent history reviewed, required imaging and test results available. Performed  Patient placed on cardiac monitor, pulse oximetry, supplemental oxygen as necessary.  Sedation given: Propofol 70 mg Pacer pads placed anterior and posterior chest. Cardioverted 1time(s).  Cardioverted at 120J. Evaluation Findings: Post procedure EKG shows: NSR Complications: None Patient didtolerate procedure well.  Jonathan Latch, Jonathan Vargas 05/29/2016, 1:53 PM   ASSESSMENT & PLAN:    1.PAF/flutter -last cardioversion in 2018. Has seen EPin the past- opted to continue with current regimen for his PAF. He continues to have chronic bradycardia - he is not sure if his symptoms of lightheadedness/dizziness are concerning - he feels this was more due to being at home and not being able to exercise - this has improved [facility]. He will continue to monitor.   2.  Persistent bradycardia - see #1. PPM at some point may be needed - but not now - he will monitor. Repeat EKG on return.   3. HTN - BP is great - no changes made today.   4. Chronic anticoagulation - no problems noted - he will be having labs with PCP next month.   5. Obesity - he continues to make his weight a priority.  6. Smoking - still with 6 cigs/day - he is still thinking about stopping.   7. COVID-19 Education: The signs and symptoms of COVID-19 were discussed with the patient and how to seek care for testing (follow up with PCP or arrange E-visit).  The importance of social distancing, staying at home, hand hygiene and wearing a mask when out in public were discussed today.  Current medicines are reviewed with the patient today.  The patient does not have concerns regarding medicines other than what has been noted above.  The following changes have been made:  See above.  Labs/ tests ordered today include:    Orders Placed This Encounter  Procedures  . EKG 12-Lead     Disposition:   FU with me in 4 months.   Patient is agreeable to this plan and will call if any problems develop in the interim.   SignedTruitt Merle, NP  12/07/2018 10:29 AM  [facility] [address] [place], [place]  [address] Phone: [phone] Fax: [phone]

## 2018-12-07 ENCOUNTER — Ambulatory Visit (INDEPENDENT_AMBULATORY_CARE_PROVIDER_SITE_OTHER): Payer: PPO | Admitting: Nurse Practitioner

## 2018-12-07 ENCOUNTER — Ambulatory Visit (INDEPENDENT_AMBULATORY_CARE_PROVIDER_SITE_OTHER): Payer: PPO

## 2018-12-07 ENCOUNTER — Encounter: Payer: Self-pay | Admitting: Nurse Practitioner

## 2018-12-07 ENCOUNTER — Other Ambulatory Visit: Payer: Self-pay

## 2018-12-07 VITALS — BP 128/64 | HR 51 | Ht 74.0 in | Wt 207.0 lb

## 2018-12-07 DIAGNOSIS — Z7189 Other specified counseling: Secondary | ICD-10-CM

## 2018-12-07 DIAGNOSIS — Z79899 Other long term (current) drug therapy: Secondary | ICD-10-CM | POA: Diagnosis not present

## 2018-12-07 DIAGNOSIS — I10 Essential (primary) hypertension: Secondary | ICD-10-CM | POA: Diagnosis not present

## 2018-12-07 DIAGNOSIS — I480 Paroxysmal atrial fibrillation: Secondary | ICD-10-CM | POA: Diagnosis not present

## 2018-12-07 LAB — POCT INR: INR: 2.3 (ref 2.0–3.0)

## 2018-12-07 NOTE — Patient Instructions (Addendum)
After Visit Summary:  We will be checking the following labs today - NONE   Medication Instructions:    Continue with your current medicines.    If you need a refill on your cardiac medications before your next appointment, please call your pharmacy.     Testing/Procedures To Be Arranged:  N/A  Follow-Up:   See me in 4 months with EKG    At [facility], you and your health needs are our priority.  As part of our continuing mission to provide you with exceptional heart care, we have created designated Provider Care Teams.  These Care Teams include your primary Cardiologist (physician) and Advanced Practice Providers (APPs -  Physician Assistants and Nurse Practitioners) who all work together to provide you with the care you need, when you need it.  Special Instructions:  . Stay safe, stay home, wash your hands for at least 20 seconds and wear a mask when out in public.  . It was good to talk with you today.  . If you have any more lightheadedness - let me know - we may need to put a monitor on.    Call the [place] office at [phone] if you have any questions, problems or concerns.

## 2018-12-07 NOTE — Patient Instructions (Signed)
Description   Continue on same dosage 2 tablets daily except 2.5 tablets on Mondays, Wednesdays and Fridays. Recheck in 6 weeks. Call if placed on any new medications [phone].

## 2018-12-09 ENCOUNTER — Other Ambulatory Visit: Payer: Self-pay | Admitting: Cardiology

## 2018-12-14 ENCOUNTER — Other Ambulatory Visit: Payer: Self-pay | Admitting: Cardiology

## 2018-12-16 DIAGNOSIS — G4733 Obstructive sleep apnea (adult) (pediatric): Secondary | ICD-10-CM | POA: Diagnosis not present

## 2018-12-23 DIAGNOSIS — H524 Presbyopia: Secondary | ICD-10-CM | POA: Diagnosis not present

## 2018-12-23 DIAGNOSIS — H35013 Changes in retinal vascular appearance, bilateral: Secondary | ICD-10-CM | POA: Diagnosis not present

## 2018-12-23 DIAGNOSIS — H31013 Macula scars of posterior pole (postinflammatory) (post-traumatic), bilateral: Secondary | ICD-10-CM | POA: Diagnosis not present

## 2018-12-23 DIAGNOSIS — H40013 Open angle with borderline findings, low risk, bilateral: Secondary | ICD-10-CM | POA: Diagnosis not present

## 2018-12-23 DIAGNOSIS — H35033 Hypertensive retinopathy, bilateral: Secondary | ICD-10-CM | POA: Diagnosis not present

## 2018-12-31 DIAGNOSIS — L570 Actinic keratosis: Secondary | ICD-10-CM | POA: Diagnosis not present

## 2018-12-31 DIAGNOSIS — D485 Neoplasm of uncertain behavior of skin: Secondary | ICD-10-CM | POA: Diagnosis not present

## 2018-12-31 DIAGNOSIS — D225 Melanocytic nevi of trunk: Secondary | ICD-10-CM | POA: Diagnosis not present

## 2018-12-31 DIAGNOSIS — Z85828 Personal history of other malignant neoplasm of skin: Secondary | ICD-10-CM | POA: Diagnosis not present

## 2018-12-31 DIAGNOSIS — L821 Other seborrheic keratosis: Secondary | ICD-10-CM | POA: Diagnosis not present

## 2018-12-31 DIAGNOSIS — L988 Other specified disorders of the skin and subcutaneous tissue: Secondary | ICD-10-CM | POA: Diagnosis not present

## 2018-12-31 DIAGNOSIS — D2271 Melanocytic nevi of right lower limb, including hip: Secondary | ICD-10-CM | POA: Diagnosis not present

## 2018-12-31 DIAGNOSIS — D2272 Melanocytic nevi of left lower limb, including hip: Secondary | ICD-10-CM | POA: Diagnosis not present

## 2018-12-31 DIAGNOSIS — D1801 Hemangioma of skin and subcutaneous tissue: Secondary | ICD-10-CM | POA: Diagnosis not present

## 2019-01-04 DIAGNOSIS — E785 Hyperlipidemia, unspecified: Secondary | ICD-10-CM | POA: Diagnosis not present

## 2019-01-04 DIAGNOSIS — I10 Essential (primary) hypertension: Secondary | ICD-10-CM | POA: Diagnosis not present

## 2019-01-04 DIAGNOSIS — J4520 Mild intermittent asthma, uncomplicated: Secondary | ICD-10-CM | POA: Diagnosis not present

## 2019-01-04 DIAGNOSIS — R195 Other fecal abnormalities: Secondary | ICD-10-CM | POA: Diagnosis not present

## 2019-01-04 DIAGNOSIS — Z1389 Encounter for screening for other disorder: Secondary | ICD-10-CM | POA: Diagnosis not present

## 2019-01-04 DIAGNOSIS — R7303 Prediabetes: Secondary | ICD-10-CM | POA: Diagnosis not present

## 2019-01-04 DIAGNOSIS — I4891 Unspecified atrial fibrillation: Secondary | ICD-10-CM | POA: Diagnosis not present

## 2019-01-04 DIAGNOSIS — C61 Malignant neoplasm of prostate: Secondary | ICD-10-CM | POA: Diagnosis not present

## 2019-01-04 DIAGNOSIS — Z125 Encounter for screening for malignant neoplasm of prostate: Secondary | ICD-10-CM | POA: Diagnosis not present

## 2019-01-04 DIAGNOSIS — F322 Major depressive disorder, single episode, severe without psychotic features: Secondary | ICD-10-CM | POA: Diagnosis not present

## 2019-01-04 DIAGNOSIS — Z0000 Encounter for general adult medical examination without abnormal findings: Secondary | ICD-10-CM | POA: Diagnosis not present

## 2019-01-11 DIAGNOSIS — E785 Hyperlipidemia, unspecified: Secondary | ICD-10-CM | POA: Diagnosis not present

## 2019-01-11 DIAGNOSIS — J4520 Mild intermittent asthma, uncomplicated: Secondary | ICD-10-CM | POA: Diagnosis not present

## 2019-01-11 DIAGNOSIS — I10 Essential (primary) hypertension: Secondary | ICD-10-CM | POA: Diagnosis not present

## 2019-01-11 DIAGNOSIS — F322 Major depressive disorder, single episode, severe without psychotic features: Secondary | ICD-10-CM | POA: Diagnosis not present

## 2019-01-11 DIAGNOSIS — C61 Malignant neoplasm of prostate: Secondary | ICD-10-CM | POA: Diagnosis not present

## 2019-01-11 DIAGNOSIS — I4891 Unspecified atrial fibrillation: Secondary | ICD-10-CM | POA: Diagnosis not present

## 2019-01-16 DIAGNOSIS — G4733 Obstructive sleep apnea (adult) (pediatric): Secondary | ICD-10-CM | POA: Diagnosis not present

## 2019-01-18 ENCOUNTER — Ambulatory Visit (INDEPENDENT_AMBULATORY_CARE_PROVIDER_SITE_OTHER): Payer: PPO | Admitting: Pharmacist

## 2019-01-18 ENCOUNTER — Other Ambulatory Visit: Payer: Self-pay

## 2019-01-18 DIAGNOSIS — I480 Paroxysmal atrial fibrillation: Secondary | ICD-10-CM

## 2019-01-18 LAB — POCT INR: INR: 2.3 (ref 2.0–3.0)

## 2019-01-18 NOTE — Patient Instructions (Signed)
Description   Continue on same dosage 2 tablets daily except 2.5 tablets on Mondays, Wednesdays and Fridays. Recheck in 8 weeks. Call if placed on any new medications [phone].       

## 2019-01-20 DIAGNOSIS — M150 Primary generalized (osteo)arthritis: Secondary | ICD-10-CM | POA: Diagnosis not present

## 2019-01-20 DIAGNOSIS — M1A09X0 Idiopathic chronic gout, multiple sites, without tophus (tophi): Secondary | ICD-10-CM | POA: Diagnosis not present

## 2019-01-20 DIAGNOSIS — Z79899 Other long term (current) drug therapy: Secondary | ICD-10-CM | POA: Diagnosis not present

## 2019-01-22 DIAGNOSIS — G4733 Obstructive sleep apnea (adult) (pediatric): Secondary | ICD-10-CM | POA: Diagnosis not present

## 2019-02-09 DIAGNOSIS — I4891 Unspecified atrial fibrillation: Secondary | ICD-10-CM | POA: Diagnosis not present

## 2019-02-09 DIAGNOSIS — C61 Malignant neoplasm of prostate: Secondary | ICD-10-CM | POA: Diagnosis not present

## 2019-02-09 DIAGNOSIS — E785 Hyperlipidemia, unspecified: Secondary | ICD-10-CM | POA: Diagnosis not present

## 2019-02-09 DIAGNOSIS — I10 Essential (primary) hypertension: Secondary | ICD-10-CM | POA: Diagnosis not present

## 2019-02-09 DIAGNOSIS — J4520 Mild intermittent asthma, uncomplicated: Secondary | ICD-10-CM | POA: Diagnosis not present

## 2019-02-09 DIAGNOSIS — F329 Major depressive disorder, single episode, unspecified: Secondary | ICD-10-CM | POA: Diagnosis not present

## 2019-02-09 DIAGNOSIS — F322 Major depressive disorder, single episode, severe without psychotic features: Secondary | ICD-10-CM | POA: Diagnosis not present

## 2019-02-15 DIAGNOSIS — G4733 Obstructive sleep apnea (adult) (pediatric): Secondary | ICD-10-CM | POA: Diagnosis not present

## 2019-03-03 DIAGNOSIS — I10 Essential (primary) hypertension: Secondary | ICD-10-CM | POA: Diagnosis not present

## 2019-03-03 DIAGNOSIS — I4891 Unspecified atrial fibrillation: Secondary | ICD-10-CM | POA: Diagnosis not present

## 2019-03-03 DIAGNOSIS — C61 Malignant neoplasm of prostate: Secondary | ICD-10-CM | POA: Diagnosis not present

## 2019-03-03 DIAGNOSIS — J4520 Mild intermittent asthma, uncomplicated: Secondary | ICD-10-CM | POA: Diagnosis not present

## 2019-03-03 DIAGNOSIS — E785 Hyperlipidemia, unspecified: Secondary | ICD-10-CM | POA: Diagnosis not present

## 2019-03-03 DIAGNOSIS — F322 Major depressive disorder, single episode, severe without psychotic features: Secondary | ICD-10-CM | POA: Diagnosis not present

## 2019-03-15 ENCOUNTER — Ambulatory Visit (INDEPENDENT_AMBULATORY_CARE_PROVIDER_SITE_OTHER): Payer: PPO | Admitting: *Deleted

## 2019-03-15 ENCOUNTER — Other Ambulatory Visit: Payer: Self-pay

## 2019-03-15 DIAGNOSIS — I480 Paroxysmal atrial fibrillation: Secondary | ICD-10-CM

## 2019-03-15 DIAGNOSIS — Z5181 Encounter for therapeutic drug level monitoring: Secondary | ICD-10-CM | POA: Diagnosis not present

## 2019-03-15 LAB — POCT INR: INR: 2.6 (ref 2.0–3.0)

## 2019-03-15 NOTE — Patient Instructions (Signed)
Description   Continue on same dosage 2 tablets daily except 2.5 tablets on Mondays, Wednesdays and Fridays. Recheck in 8 weeks. Call if placed on any new medications [phone].

## 2019-03-17 IMAGING — CR DG CHEST 2V
2 series · 2 of 2 positions shown · non-contrast
Comparison: 06/05/2015 and earlier.

CLINICAL DATA: 71-year-old male preoperative study for prostate
radiotherapy see placement. Atrial fibrillation and hypertension.

EXAM:
CHEST - 2 VIEW

[w chest pa *]
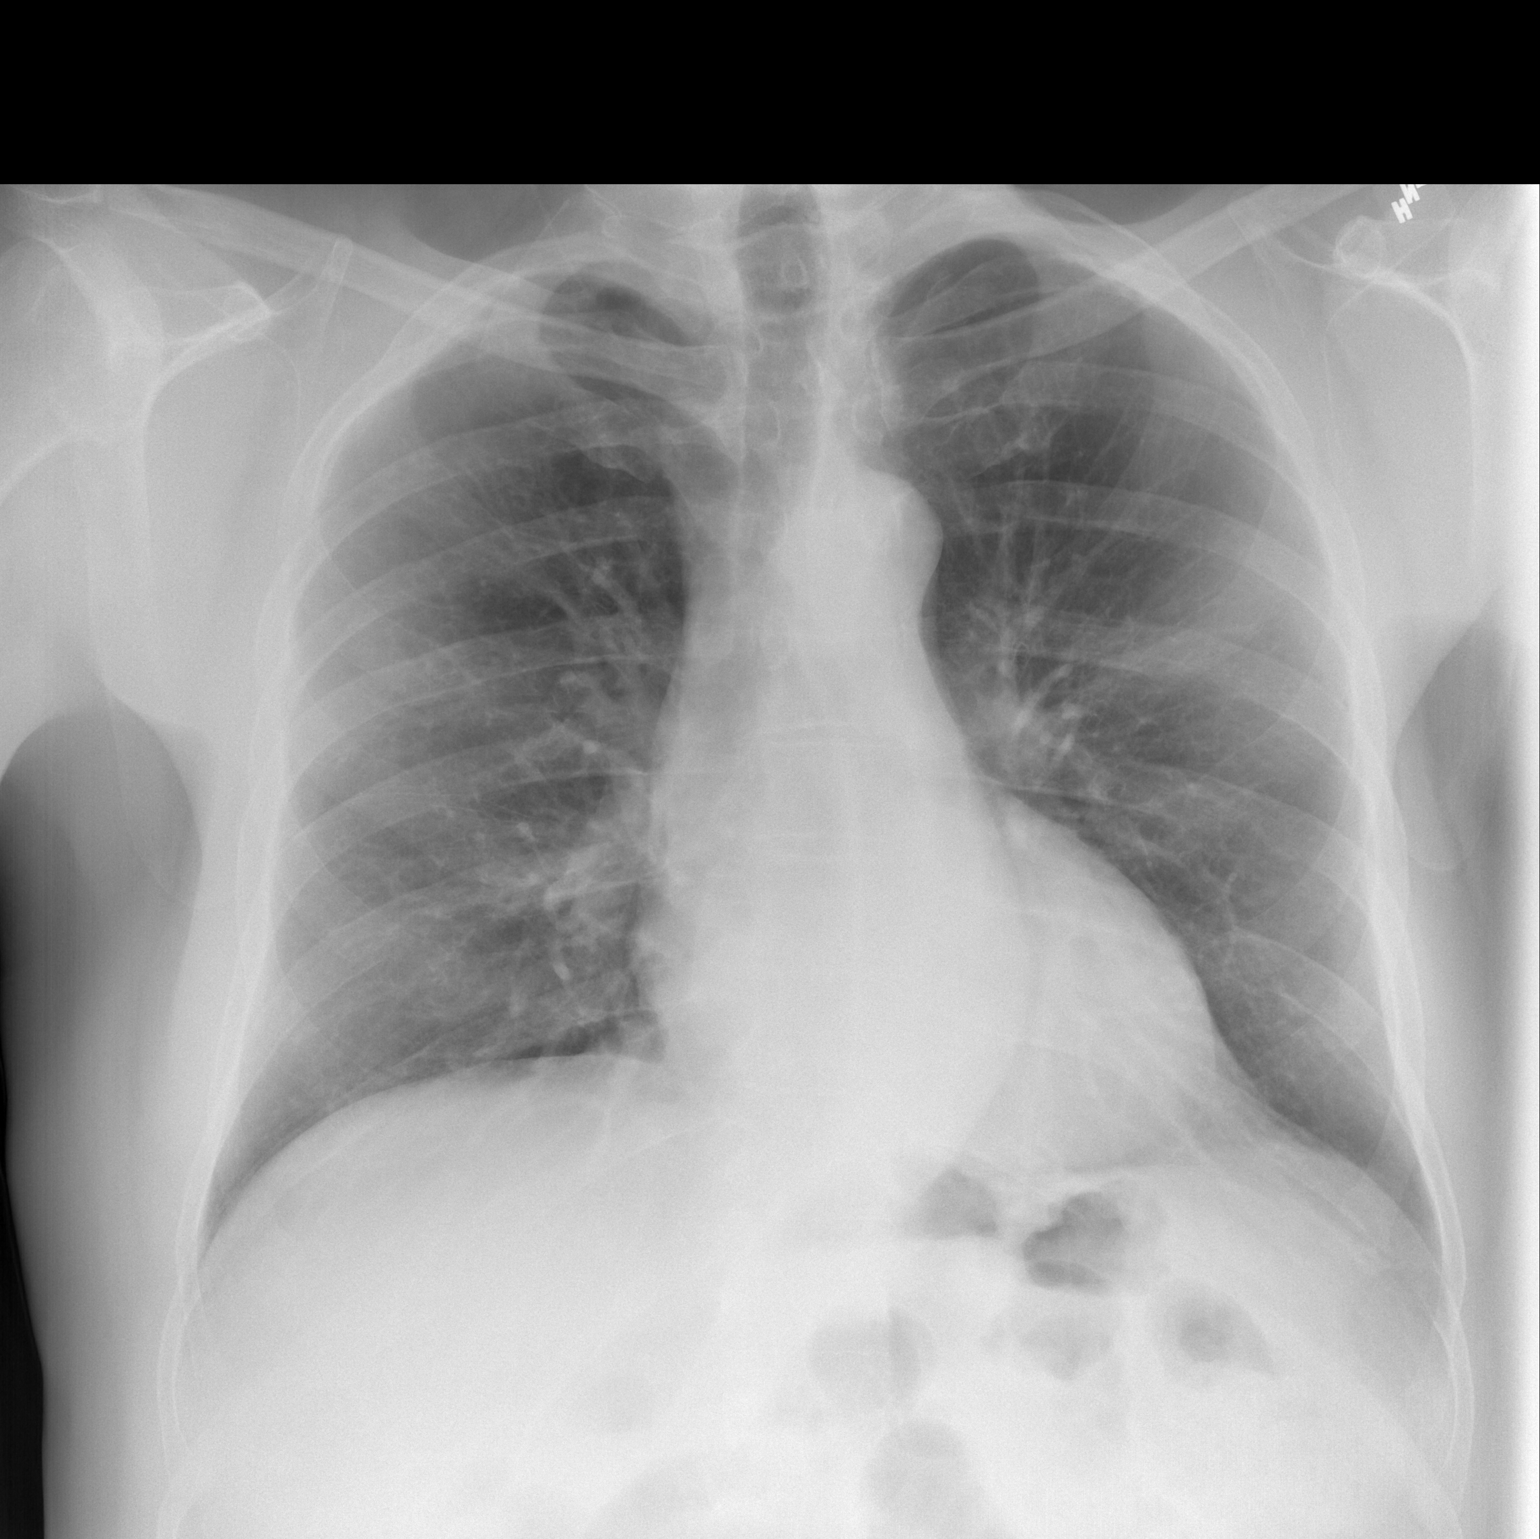

[w chest lat *]
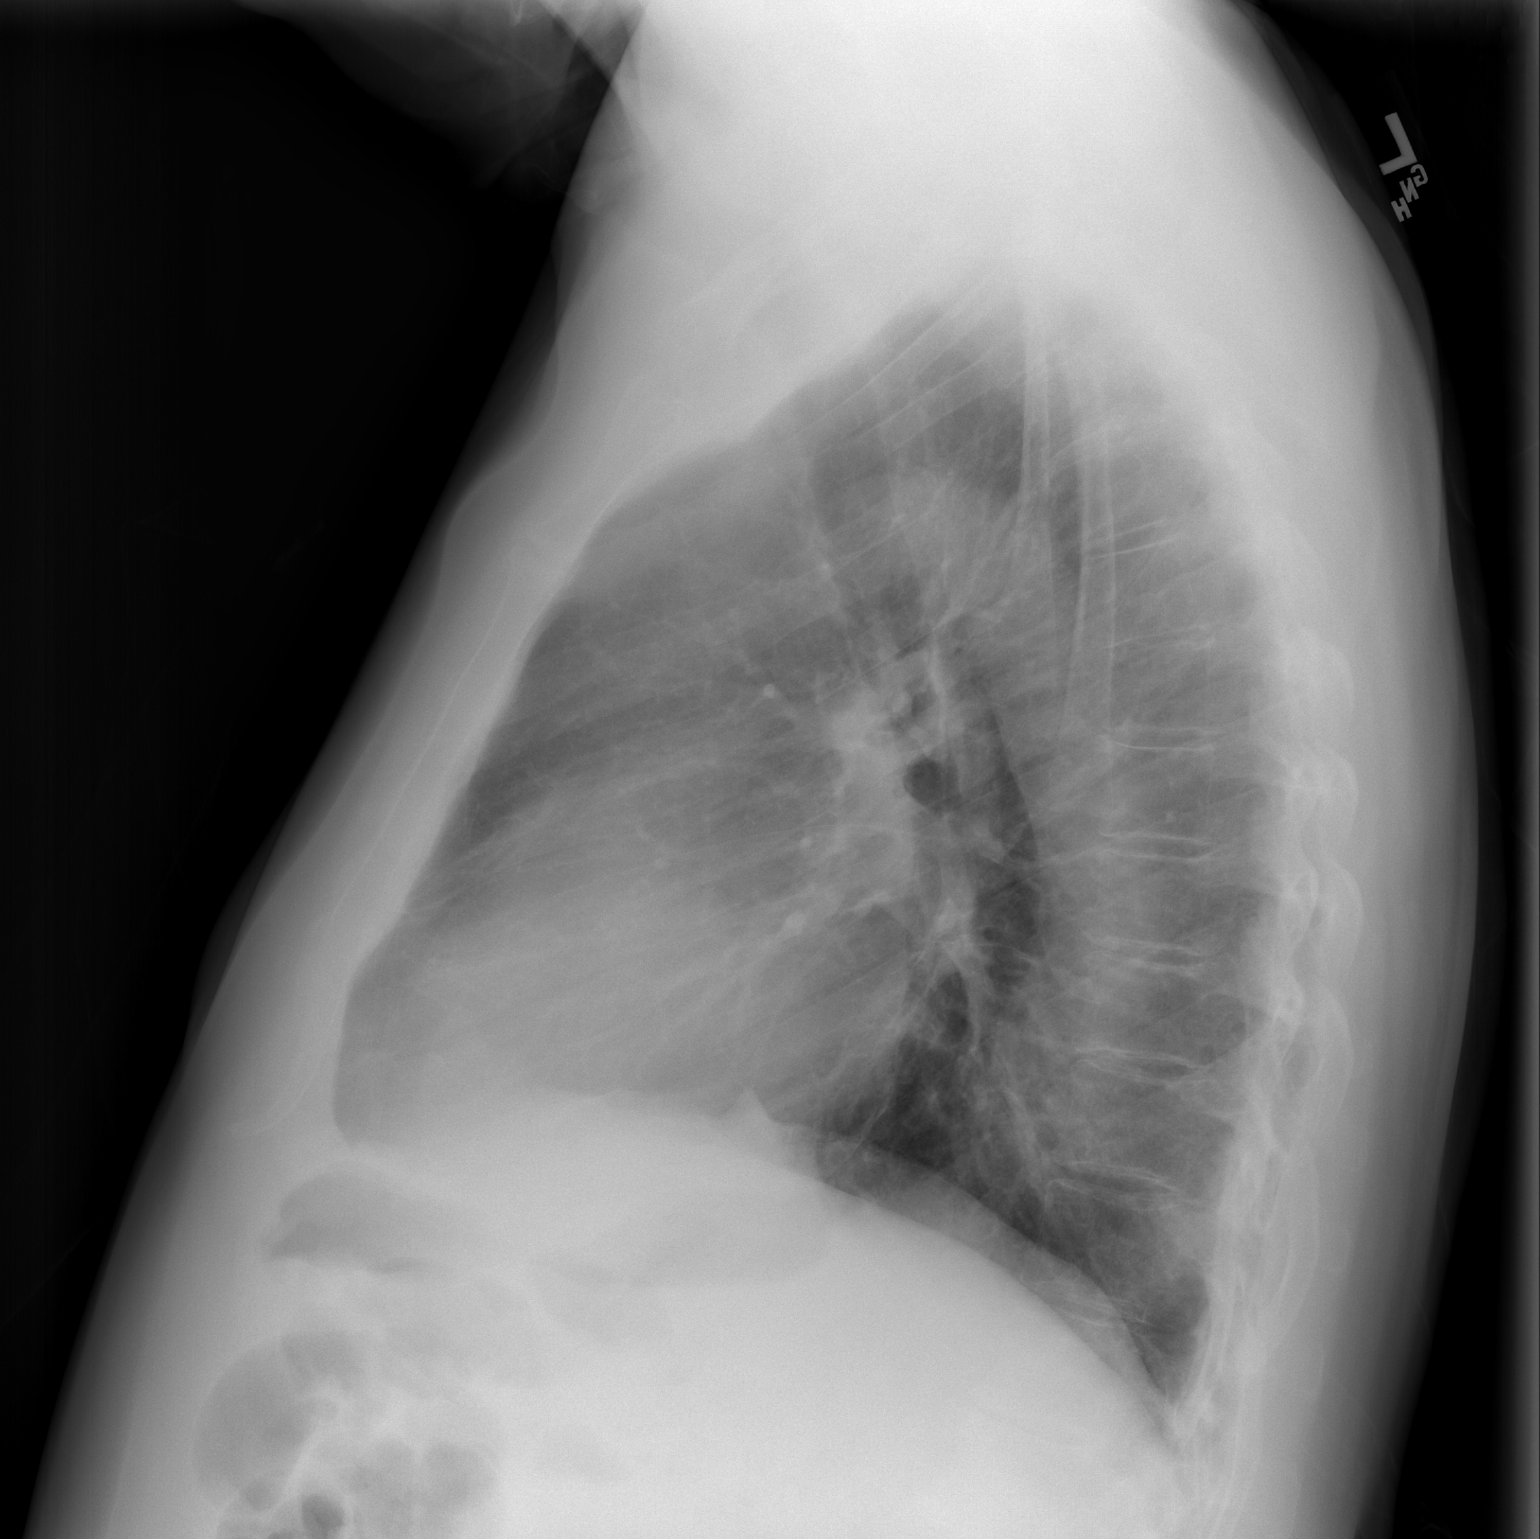

[2 of 2 positions shown; findings below may reference images not displayed]

FINDINGS: Cardiac size is stable and within normal limits. Stable mild
tortuosity of the descending thoracic aorta. Other mediastinal
contours are within normal limits. Visualized tracheal air column is
within normal limits. Normal lung volumes. The lungs are clear. No
pneumothorax or pleural effusion. Negative visible bowel gas
pattern. No acute osseous abnormality identified.
IMPRESSION: Negative.  No acute cardiopulmonary abnormality.

## 2019-03-18 ENCOUNTER — Other Ambulatory Visit: Payer: Self-pay | Admitting: *Deleted

## 2019-03-18 MED ORDER — FLECAINIDE ACETATE 50 MG PO TABS: ORAL_TABLET | ORAL | 3 refills | Status: DC

## 2019-03-24 ENCOUNTER — Ambulatory Visit (INDEPENDENT_AMBULATORY_CARE_PROVIDER_SITE_OTHER): Payer: PPO | Admitting: Family Medicine

## 2019-03-24 ENCOUNTER — Other Ambulatory Visit: Payer: Self-pay

## 2019-03-24 ENCOUNTER — Ambulatory Visit: Payer: Self-pay

## 2019-03-24 ENCOUNTER — Encounter: Payer: Self-pay | Admitting: Family Medicine

## 2019-03-24 VITALS — Ht 74.0 in | Wt 204.2 lb

## 2019-03-24 DIAGNOSIS — M25561 Pain in right knee: Secondary | ICD-10-CM | POA: Diagnosis not present

## 2019-03-24 DIAGNOSIS — G8929 Other chronic pain: Secondary | ICD-10-CM

## 2019-03-24 NOTE — Progress Notes (Signed)
Office Visit Note   Patient: Jonathan Vargas           Date of Birth: August 28, 1945           MRN: [id number] Visit Date: 03/24/2019 Requested by: Leeroy Cha, MD [address] [place],  [place] [address] PCP: Leeroy Cha, MD  Subjective: Chief Complaint  Patient presents with  . Right Knee - Pain    Sporadic medial knee pain since stepping up onto a curb with his right leg 2 months ago. "Twinges" of pain in that area with walking on the treadmill.    HPI: He is here with recurrent right knee pain.  Intermittent symptoms for about 2 months with no injury.  Has been walking on a treadmill regularly.  His previous stress fracture pain resolved and he was doing very well.  Last week he stepped up onto a curb and felt a sharp twinge of pain on the medial aspect of his knee.  He had a bad day a few days ago and was only able to walk 15 minutes on the treadmill but since then, he seems to be close to normal.  Previous MRI scan last May showed a tiny area of fraying of the posterior horn medial meniscus.              ROS: No fever or chills.  All other systems were reviewed and are negative.  Objective: Vital Signs: Ht 6\' 2"  (1.88 m)   Wt 204 lb 3.2 oz (92.6 kg)   BMI 26.22 kg/m   Physical Exam:  General:  Alert and oriented, in no acute distress. Pulm:  Breathing unlabored. Psy:  Normal mood, congruent affect. Skin: No rash or erythema. Right knee: Trace effusion with no warmth.  2+ patellofemoral crepitus.  Negative patella apprehension test.  Ligaments feel stable.  Full range of motion.  He is mildly tender on the medial joint line.  Pain but no definite click with McMurray's.  Imaging: X-rays right knee: There are subchondral cystic changes at the medial femoral condyle that were not present in July.  No definite stress fracture seen.  No sign of loose body.    Assessment & Plan: 1.  Improving right knee pain, symptoms suggesting medial meniscus  injury.  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 -MRI to further evaluate.     Procedures: No procedures performed  No notes on file     PMFS History: Patient Active Problem List   Diagnosis Date Noted  . It band syndrome, left 01/09/2018  . Malignant neoplasm of prostate ([place]) 10/22/2017  . Chronic pain of left knee 09/30/2017  . Personal history of colonic polyps 09/26/2016  . Family history of colon cancer 09/26/2016  . Obesity 03/29/2015  . Tobacco use 03/29/2015  . 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 Depressive disorder, not elsewhere classified 12/13/2012  . Obstructive sleep apnea 12/13/2012  . Esophageal reflux 12/13/2012  . Impaired fasting glucose 12/13/2012  . Tobacco use disorder 12/13/2012  . 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 Gout, unspecified 12/13/2012  . Long term current use of anticoagulant therapy 12/13/2012  . Atrial fibrillation ([place]) 01/30/2011  . Bradycardia 01/30/2011  . Sleep apnea 01/30/2011  . Atrial flutter ([place]) 01/25/2011  . Essential hypertension, benign 01/25/2011  . Pure hypercholesterolemia 01/25/2011   Past Medical History:  Diagnosis Date  . Acid reflux   . Arthritis   . ASD (atrial septal defect)   . Asthma  with allergic rhinitis and status asthmaticus    NO RECENT FLARE UPS  . Atrial fib/flutter, transient   . Cancer ([place])    skin  . Chronic anticoagulation   . Depression   . Dysrhythmia    ATRIAL FIB  . ENT complaint    Dr. Erik Obey  . Erectile dysfunction   . Fatty liver   . FHx: allergies   . Gout    , Possible, Right Knee x2 ; intermittent milder episodes of pain in hte first MTP - uric acid 7.7- no treatment so far.  . Hemochromatosis    DOES TWICE YEAR  . History of kidney stones    SMALL IN RIGHT KIDNEY  . Hypercholesteremia   . Hyperlipemia    PT DENIES  . Hypertension   . Hypogonadism male   . Impaired fasting glucose   . 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  . Pneumonia    " walking PNA > 40 years ago"  . Prostate cancer ([place]) [place] 2018  . Seasonal allergies   . Sleep apnea    wears CPAP ( set at 4-11)  . Trigger finger, left    , fourth finger    Family History  Problem Relation Age of Onset  . Stroke Father   . Diabetes type II Father   . Colon cancer Father   . Heart disease Mother        with pacemaker  . Breast cancer Sister   . Diabetes type II Maternal Grandfather   . Leukemia Paternal Uncle   . Prostate cancer Other   . Pancreatic cancer Neg Hx     Past Surgical History:  Procedure Laterality Date  . CARDIOVERSION N/A 05/29/2016   Procedure: CARDIOVERSION;  Surgeon: [name], MD;  Location: [facility];  Service: Cardiovascular;  Laterality: N/A;  . CATARACT EXTRACTION W/ INTRAOCULAR LENS  IMPLANT, BILATERAL  2016  . COLONOSCOPY WITH PROPOFOL N/A 09/26/2016   Procedure: COLONOSCOPY WITH PROPOFOL;  Surgeon: Wilford Corner, MD;  Location: [facility];  Service: Endoscopy;  Laterality: N/A;  . CYSTOSCOPY  01/23/2018   Procedure: CYSTOSCOPY FLEXIBLE;  Surgeon: Franchot Gallo, MD;  Location: [facility];  Service: Urology;;  no seeds found in bladder  . ESOPHAGOGASTRODUODENOSCOPY (EGD) WITH PROPOFOL N/A 09/26/2016   Procedure: ESOPHAGOGASTRODUODENOSCOPY (EGD) WITH PROPOFOL;  Surgeon: Wilford Corner, MD;  Location: [facility];  Service: Endoscopy;  Laterality: N/A;  . LIVER BIOPSY  MANY YRS AGO  . NASAL SINUS SURGERY  1997  . RADIOACTIVE SEED IMPLANT N/A 01/23/2018   Procedure: RADIOACTIVE SEED IMPLANT/BRACHYTHERAPY IMPLANT;  Surgeon: Franchot Gallo, MD;  Location: [facility];  Service: Urology;  Laterality: N/A;   69 seeds implanted  . SPACE OAR INSTILLATION N/A 01/23/2018   Procedure: SPACE OAR INSTILLATION;  Surgeon: Franchot Gallo, MD;  Location: [facility];  Service: Urology;   Laterality: N/A;   Social History   Occupational History  . Occupation: retired  Tobacco Use  . Smoking status: Current Every Day Smoker    Packs/day: 0.25    Years: 50.00    Pack years: 12.50    Types: Cigarettes    Start date: 10/30/2013  . Smokeless tobacco: Former Systems developer    Types: Chew  . Tobacco comment: trying to quit 5 TO 6 CIGS PER DAY NOW  Substance and Sexual Activity  . Alcohol use: Yes    Alcohol/week: 0.0 standard drinks    Comment:  2- glasses of red wine per night  . Drug use: No  . Sexual activity: Not Currently

## 2019-03-26 NOTE — Progress Notes (Signed)
CARDIOLOGY OFFICE NOTE  Date:  03/29/2019    Gwenyth Bender Gelinas Date of Birth: [birth year] Medical Record [id number]  PCP:  Leeroy Cha, MD  Cardiologist:  Marisa Cyphers    Chief Complaint  Patient presents with  . Follow-up    Seen for Dr. Marlou Porch & Allred    History of Present Illness: Jonathan Vargas is a 74 y.o. adult who presents today for a 4 month check.  Seen for Dr. Marlou Porch &Allred.  [name]".  He has a history ofatrial fibrillation/flutter paroxysmal on antiarrhythmic and is onchronicwarfarin for his anticoagulation. Had stress test 09/01/12 that demonstrated a mild inferior wall defect, possible diaphragmatic attenuation, overall low risk study with normal ejection fraction. Other issues include HTN, prior carotid artery dissection in 1997, ongoing tobacco abuse, hemachromatosis. Previous hospitalization in November 2012 demonstrated paroxysmal atrial flutter which converted after 4 hours of diltiazem. He had a postconversion pause of 8.2 seconds, felt flushed. Endorses chronic palpitations.   I saw him as a work in back in [place]- out of rhythm. Somewhat symptomatic. Got him cardioverted and back to see [name] for discussion of other options - but opted for his continued regimen. ARB was restarted for elevated BP. Patient elected to continue CCB therapy despite bradycardia.He has continued on his anticoagulation with coumadin.   Dr. Marlou Porch and I have followed him since. He has had intermittent issues with his BP. He has ongoing use of alcohol. He has had to have CCB eventually stopped due to presyncope.There was concern for post conversion pauses which has been documented in the past.  He has actually made good progress over the past year or so with losing weight - was going to diabetic classes. Has had some atypical chest pain - has needed clearance for GU surgery and we have updated his stress test (October 2019) which was  reassuring.  Still smoking a few cigarettes and alcohol continues. Cardiac status has been ok. Last seen him in September- he was doing well - [facility] had [facility] him [facility]. Some spells of dizziness/lightheadedness - no syncope - he thought this had improved with [facility] and thought it was more "mental". Otherwise he was felt to be doing well.   The patient does not have symptoms concerning for COVID-19 infection (fever, chills, cough, or new shortness of breath).   Comes in today. Here alone. He feels good. Wants the COVID vaccine. [facility] - [facility] - says no one is there - he goes for his "mental health" - otherwise, staying at home. Weight is up a few pounds but he feels like he is making muscle. No chest pain. Breathing is good. He has upped his vitamin D intake. Not dizzy or lightheaded. Exercise endurance is good. He is happy with how he is doing.   Past Medical History:  Diagnosis Date  . Acid reflux   . Arthritis   . ASD (atrial septal defect)   . Asthma with allergic rhinitis and status asthmaticus    NO RECENT FLARE UPS  . Atrial fib/flutter, transient   . Cancer ([place])    skin  . Chronic anticoagulation   . Depression   . Dysrhythmia    ATRIAL FIB  . ENT complaint    Dr. Erik Obey  . Erectile dysfunction   . Fatty liver   . FHx: allergies   . Gout    ,  Possible, Right Knee x2 ; intermittent milder episodes of pain in hte first MTP - uric acid 7.7- no treatment so far.  . Hemochromatosis    DOES TWICE YEAR  . History of kidney stones    SMALL IN RIGHT KIDNEY  . Hypercholesteremia   . Hyperlipemia    PT DENIES  . Hypertension   . Hypogonadism male   . Impaired fasting glucose   . 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  . Pneumonia    " walking PNA > 40 years ago"  . Prostate cancer ([place]) [place] 2018  . Seasonal allergies   . Sleep apnea     wears CPAP ( set at 4-11)  . Trigger finger, left    , fourth finger    Past Surgical History:  Procedure Laterality Date  . CARDIOVERSION N/A 05/29/2016   Procedure: CARDIOVERSION;  Surgeon: [name], MD;  Location: [facility];  Service: Cardiovascular;  Laterality: N/A;  . CATARACT EXTRACTION W/ INTRAOCULAR LENS  IMPLANT, BILATERAL  2016  . COLONOSCOPY WITH PROPOFOL N/A 09/26/2016   Procedure: COLONOSCOPY WITH PROPOFOL;  Surgeon: Wilford Corner, MD;  Location: [facility];  Service: Endoscopy;  Laterality: N/A;  . CYSTOSCOPY  01/23/2018   Procedure: CYSTOSCOPY FLEXIBLE;  Surgeon: Franchot Gallo, MD;  Location: [facility];  Service: Urology;;  no seeds found in bladder  . ESOPHAGOGASTRODUODENOSCOPY (EGD) WITH PROPOFOL N/A 09/26/2016   Procedure: ESOPHAGOGASTRODUODENOSCOPY (EGD) WITH PROPOFOL;  Surgeon: Wilford Corner, MD;  Location: [facility];  Service: Endoscopy;  Laterality: N/A;  . LIVER BIOPSY  MANY YRS AGO  . NASAL SINUS SURGERY  1997  . RADIOACTIVE SEED IMPLANT N/A 01/23/2018   Procedure: RADIOACTIVE SEED IMPLANT/BRACHYTHERAPY IMPLANT;  Surgeon: Franchot Gallo, MD;  Location: [facility];  Service: Urology;  Laterality: N/A;   69 seeds implanted  . SPACE OAR INSTILLATION N/A 01/23/2018   Procedure: SPACE OAR INSTILLATION;  Surgeon: Franchot Gallo, MD;  Location: [facility];  Service: Urology;  Laterality: N/A;     Medications: Current Meds  Medication Sig  . albuterol (PROVENTIL HFA;VENTOLIN HFA) 108 (90 Base) MCG/ACT inhaler Inhale 2 puffs into the lungs every 6 (six) hours as needed for wheezing or shortness of breath.  . allopurinol (ZYLOPRIM) 300 MG tablet Take 300 mg by mouth every evening.   Marland Kitchen CALCIUM PO Take 200 mg by mouth 2 (two) times daily.  . Cholecalciferol (VITAMIN D3) 125 MCG (5000 UT) CAPS Take 1 capsule by mouth daily.  Marland Kitchen COLCRYS 0.6 MG tablet Take 0.6 mg by mouth 2 (two) times daily  as needed (for gout).   Marland Kitchen diclofenac sodium (VOLTAREN) 1 % GEL Apply 1 application topically 4 (four) times daily as needed (for pain).   . fexofenadine (ALLEGRA) 60 MG tablet Take 60 mg by mouth daily as needed. For allergies  . flecainide (TAMBOCOR) 50 MG tablet TAKE 1 TABLET BY MOUTH 2 TIMES DAILY FOR THE HEART.  . fluticasone (FLONASE) 50 MCG/ACT nasal spray Place 1 spray into the nose every evening.   Marland Kitchen FLUZONE HIGH-DOSE QUADRIVALENT 0.7 ML SUSY   . irbesartan (AVAPRO) 150 MG tablet TAKE 1 TABLET BY MOUTH DAILY.  Marland Kitchen MAGNESIUM PO Take 2 capsules by mouth every evening.   . Menaquinone-7 (VITAMIN K2) 100 MCG CAPS Take by mouth daily.  . Misc Natural Products (LUTEIN 20) CAPS Take 40 mg by mouth 2 (two) times daily.   . NON FORMULARY CPAP  .  TURMERIC PO Take 1 capsule by mouth 2 (two) times daily.   . vitamin B-12 (CYANOCOBALAMIN) 1000 MCG tablet Take 1,000 mcg by mouth daily.  Marland Kitchen warfarin (COUMADIN) 5 MG tablet TAKE 2 TO 2.5 TABLETS BY MOUTH DAILY AS DIRECTED BY COUMADIN CLINIC     Allergies: Allergies  Allergen Reactions  . Clindamycin/Lincomycin Diarrhea and Other (See Comments)    Stomach pain   . Ciprofloxacin Other (See Comments)    Joint pain  . Amoxicillin Rash    Social History: The patient  reports that he has been smoking cigarettes. He started smoking about 5 years ago. He has a 12.50 pack-year smoking history. He has quit using smokeless tobacco.  His smokeless tobacco use included chew. He reports current alcohol use. He reports that he does not use drugs.   Family History: The patient's family history includes Breast cancer in his sister; Colon cancer in his father; Diabetes type II in his father and maternal grandfather; Heart disease in his mother; Leukemia in his paternal uncle; Prostate cancer in an other family member; Stroke in his father.   Review of Systems: Please see the history of present illness.   All other systems are reviewed and negative.   Physical  Exam: VS:  BP 130/70   Pulse (!) 49   Ht 6\' 2"  (1.88 m)   Wt 213 lb (96.6 kg)   SpO2 97%   BMI 27.35 kg/m  .  BMI Body mass index is 27.35 kg/m.  Wt Readings from Last 3 Encounters:  03/29/19 213 lb (96.6 kg)  03/24/19 204 lb 3.2 oz (92.6 kg)  12/07/18 207 lb (93.9 kg)    General: Pleasant. Alert and in no acute distress. Weight is up some since last visit.   HEENT: Normal.  Neck: Supple, no JVD, carotid bruits, or masses noted.  Cardiac: Regular rate and rhythm. No murmurs, rubs, or gallops. No edema.  Respiratory:  Lungs are clear to auscultation bilaterally with normal work of breathing.  GI: Soft and nontender.  MS: No deformity or atrophy. Gait and ROM intact.  Skin: Warm and dry. Color is normal.  Neuro:  Strength and sensation are intact and no gross focal deficits noted.  Psych: Alert, appropriate and with normal affect.   LABORATORY DATA:  EKG:  EKG is ordered today. This demonstrates sinus bradycardia - HR is 49 today.  Lab Results  Component Value Date   WBC 6.3 01/16/2018   HGB 12.5 (L) 11/06/2018   HCT 44.5 01/16/2018   PLT 264 01/16/2018   GLUCOSE 85 01/16/2018   ALT 16 01/16/2018   AST 19 01/16/2018   NA 135 01/16/2018   K 4.4 01/16/2018   CL 102 01/16/2018   CREATININE 0.94 01/16/2018   BUN 24 (H) 01/16/2018   CO2 25 01/16/2018   TSH 3.400 05/27/2016   INR 2.6 03/15/2019       BNP (last 3 results) No results for input(s): BNP in the last 8760 hours.  ProBNP (last 3 results) No results for input(s): PROBNP in the last 8760 hours.   Other Studies Reviewed Today:  MyoviewStudy Highlights10/2019   Nuclear stress EF: 55%.  There was no ST segment deviation noted during stress.  The study is normal.  This is a low risk study.  The left ventricular ejection fraction is normal (55-65%).    Echo Study Conclusions 05/2016  - Left ventricle: The cavity size was normal. Wall thickness was increased in a pattern of mild LVH.  Systolic  function was normal. The estimated ejection fraction was in the range of 55% to 60%. Wall motion was normal; there were no regional wall motion abnormalities. Left ventricular diastolic function parameters were normal. - Left atrium: The atrium was moderately dilated. - Atrial septum: No defect or patent foramen ovale was identified.  Procedure: Electrical Cardioversion Indications:Atrial Flutter  Procedure Details Consent: Risks of procedure as well as the alternatives and risks of each were explained to the (patient/caregiver). Consent for procedure obtained. Time [id number] patient identification, verified procedure, site/side was marked, verified correct patient position, special equipment/implants available, medications/allergies/relevent history reviewed, required imaging and test results available. Performed  Patient placed on cardiac monitor, pulse oximetry, supplemental oxygen as necessary.  Sedation given: Propofol 70 mg Pacer pads placed anterior and posterior chest. Cardioverted 1time(s).  Cardioverted at 120J. Evaluation Findings: Post procedure EKG shows: NSR Complications: None Patient didtolerate procedure well.  [name], MD 05/29/2016, 1:53 PM   ASSESSMENT & PLAN:   1. PAF/flutter - last cardioversion in 2018 - has seen EP in the past - he remains on AAD therapy - remains in sinus. He has chronic bradycardia - he is not symptomatic.   2. Persistent bradycardia - asymptomatic. He knows to let us know if he has any dizzy/lightheadedness/passing out. PPM may be needed at some point, but not at this time.   3. HTN - BP is good - no changes made today.   4. Chronic anticoagulation - no problems. No need to hold for COVID vaccine - he is aware.   5. Obesity - continues to be very motivated with this.   6. Tobacco use  7. COVID-19 Education: The signs and symptoms of COVID-19 were discussed with the patient and how to  seek care for testing (follow up with PCP or arrange E-visit).  The importance of social distancing, staying at home, hand hygiene and wearing a mask when out in public were discussed today.  Current medicines are reviewed with the patient today.  The patient does not have concerns regarding medicines other than what has been noted above.  The following changes have been made:  See above.  Labs/ tests ordered today include:    Orders Placed This Encounter  Procedures  . EKG 12-Lead     Disposition:   FU with me in 4 months. Recheck EKG on return.    Patient is agreeable to this plan and will call if any problems develop in the interim.   SignedTruitt Merle, NP  03/29/2019 10:59 AM  [facility] [address] [place], [place]  [address] Phone: [phone] Fax: [phone]

## 2019-03-29 ENCOUNTER — Encounter: Payer: Self-pay | Admitting: Nurse Practitioner

## 2019-03-29 ENCOUNTER — Other Ambulatory Visit: Payer: Self-pay

## 2019-03-29 ENCOUNTER — Ambulatory Visit: Payer: PPO | Admitting: Nurse Practitioner

## 2019-03-29 VITALS — BP 130/70 | HR 49 | Ht 74.0 in | Wt 213.0 lb

## 2019-03-29 DIAGNOSIS — Z7189 Other specified counseling: Secondary | ICD-10-CM | POA: Diagnosis not present

## 2019-03-29 DIAGNOSIS — I480 Paroxysmal atrial fibrillation: Secondary | ICD-10-CM | POA: Diagnosis not present

## 2019-03-29 DIAGNOSIS — Z5181 Encounter for therapeutic drug level monitoring: Secondary | ICD-10-CM | POA: Diagnosis not present

## 2019-03-29 DIAGNOSIS — Z79899 Other long term (current) drug therapy: Secondary | ICD-10-CM

## 2019-03-29 DIAGNOSIS — Z7901 Long term (current) use of anticoagulants: Secondary | ICD-10-CM

## 2019-03-29 DIAGNOSIS — I10 Essential (primary) hypertension: Secondary | ICD-10-CM

## 2019-03-29 NOTE — Patient Instructions (Addendum)
After Visit Summary:  We will be checking the following labs today -  NONE   Medication Instructions:    Continue with your current medicines.    If you need a refill on your cardiac medications before your next appointment, please call your pharmacy.     Testing/Procedures To Be Arranged:  N/A  Follow-Up:   See me in 4 months with EKG    At [facility], you and your health needs are our priority.  As part of our continuing mission to provide you with exceptional heart care, we have created designated Provider Care Teams.  These Care Teams include your primary Cardiologist (physician) and Advanced Practice Providers (APPs -  Physician Assistants and Nurse Practitioners) who all work together to provide you with the care you need, when you need it.  Special Instructions:  . Stay safe, stay home, wash your hands for at least 20 seconds and wear a mask when out in public.  . It was good to talk with you today.    Call the [place] office at [phone] if you have any questions, problems or concerns.

## 2019-04-07 ENCOUNTER — Other Ambulatory Visit: Payer: Self-pay

## 2019-04-07 ENCOUNTER — Telehealth: Payer: Self-pay | Admitting: Family Medicine

## 2019-04-07 ENCOUNTER — Ambulatory Visit
Admission: RE | Admit: 2019-04-07 | Discharge: 2019-04-07 | Disposition: A | Discharge status: Home / Self Care | Payer: PPO | Source: Ambulatory Visit | Attending: Family Medicine | Admitting: Family Medicine

## 2019-04-07 DIAGNOSIS — M25561 Pain in right knee: Secondary | ICD-10-CM

## 2019-04-07 DIAGNOSIS — G8929 Other chronic pain: Secondary | ICD-10-CM

## 2019-04-07 NOTE — Telephone Encounter (Signed)
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  Will discuss with Dr. Erlinda Hong.

## 2019-04-20 ENCOUNTER — Ambulatory Visit (INDEPENDENT_AMBULATORY_CARE_PROVIDER_SITE_OTHER): Payer: PPO | Admitting: *Deleted

## 2019-04-20 ENCOUNTER — Encounter (HOSPITAL_COMMUNITY): Payer: Self-pay | Admitting: Emergency Medicine

## 2019-04-20 ENCOUNTER — Telehealth: Payer: Self-pay | Admitting: *Deleted

## 2019-04-20 ENCOUNTER — Emergency Department (HOSPITAL_COMMUNITY)
Admission: EM | Admit: 2019-04-20 | Discharge: 2019-04-20 | Disposition: A | Discharge status: Home / Self Care | Payer: PPO | Attending: Emergency Medicine | Admitting: Emergency Medicine

## 2019-04-20 ENCOUNTER — Other Ambulatory Visit: Payer: Self-pay

## 2019-04-20 DIAGNOSIS — Z7901 Long term (current) use of anticoagulants: Secondary | ICD-10-CM | POA: Diagnosis not present

## 2019-04-20 DIAGNOSIS — F17210 Nicotine dependence, cigarettes, uncomplicated: Secondary | ICD-10-CM | POA: Insufficient documentation

## 2019-04-20 DIAGNOSIS — Z8546 Personal history of malignant neoplasm of prostate: Secondary | ICD-10-CM | POA: Insufficient documentation

## 2019-04-20 DIAGNOSIS — I480 Paroxysmal atrial fibrillation: Secondary | ICD-10-CM | POA: Diagnosis not present

## 2019-04-20 DIAGNOSIS — I10 Essential (primary) hypertension: Secondary | ICD-10-CM | POA: Insufficient documentation

## 2019-04-20 DIAGNOSIS — R040 Epistaxis: Secondary | ICD-10-CM

## 2019-04-20 DIAGNOSIS — Z79899 Other long term (current) drug therapy: Secondary | ICD-10-CM | POA: Diagnosis not present

## 2019-04-20 DIAGNOSIS — Z5181 Encounter for therapeutic drug level monitoring: Secondary | ICD-10-CM | POA: Diagnosis not present

## 2019-04-20 DIAGNOSIS — I4891 Unspecified atrial fibrillation: Secondary | ICD-10-CM | POA: Insufficient documentation

## 2019-04-20 LAB — POCT INR: INR: 2 (ref 2.0–3.0)

## 2019-04-20 MED ORDER — SILVER NITRATE-POT NITRATE 75-25 % EX MISC
1.0000 "application " | Freq: Once | CUTANEOUS | Status: DC
  Filled 2019-04-20: qty 1

## 2019-04-20 MED ORDER — OXYMETAZOLINE HCL 0.05 % NA SOLN
1.0000 | Freq: Once | NASAL | Status: AC
  Administered 2019-04-20: 14:00:00 1 via NASAL
  Filled 2019-04-20: qty 30

## 2019-04-20 MED ORDER — LIDOCAINE HCL 4 % EX SOLN
Freq: Once | CUTANEOUS | Status: AC
  Filled 2019-04-20: qty 50

## 2019-04-20 NOTE — ED Triage Notes (Signed)
Reports intermittent nose bleeds since yesterday.  Reports only L nare bleeding.  No bleeding at present.  Pt takes Coumadin.

## 2019-04-20 NOTE — Patient Instructions (Signed)
Description   Continue on same dosage 2 tablets daily except 2.5 tablets on Mondays, Wednesdays and Fridays. Recheck in 8 weeks. Call if placed on any new medications [phone].

## 2019-04-20 NOTE — Telephone Encounter (Signed)
Pt left a voicemail that he has had nosebleed overnight. Called the pt back and he stated that he had a nosebleed overnight and he used Afrin and it stopped then later he states it restarted and that his bathroom was a mess from the blood. He stated he got it to stop again then it restarted while he was asleep and he had worn his cpap machine and woke up to a mess. Pt states he is not currently bleeding. Advised that we could check his INR to ensure it is not too thin but he needs to have his nose bleeding checked out by his Primary Care Physician or an ear, nose, throat doctor if he has one. He stated he would try his PCP and try get in to see them and he was fine with me making an appt to have his INR checked to ensure it is not too thin. Appt set.

## 2019-04-20 NOTE — ED Provider Notes (Signed)
[facility]ERG[facility]Y DEPARTMENT Provider Note   CSN: [id number] Arrival date & time: 04/20/19  1314     History Chief Complaint  Patient presents with  . Epistaxis    Jonathan Vargas is a 74 y.o. adult.  HPI Patient presents with a nosebleed on anticoagulation.  Started about 1 in the morning.  States he had a fair amount of bleeding at the time but then stopped.  Had a little more this afternoon.  Went to Coumadin clinic today and had an INR of 2.  No lightheadedness dizziness.  No bleeding currently.  No trauma.  Does not have a history of nosebleeds.  Is on Coumadin for atrial fibrillation.  States it is coming out of his left nostril.  Not going down the back of his throat.    Past Medical History:  Diagnosis Date  . Acid reflux   . Arthritis   . ASD (atrial septal defect)   . Asthma with allergic rhinitis and status asthmaticus    NO RECENT FLARE UPS  . Atrial fib/flutter, transient   . Cancer ([place])    skin  . Chronic anticoagulation   . Depression   . Dysrhythmia    ATRIAL FIB  . ENT complaint    Dr. Erik Obey  . Erectile dysfunction   . Fatty liver   . FHx: allergies   . Gout    , Possible, Right Knee x2 ; intermittent milder episodes of pain in hte first MTP - uric acid 7.7- no treatment so far.  . Hemochromatosis    DOES TWICE YEAR  . History of kidney stones    SMALL IN RIGHT KIDNEY  . Hypercholesteremia   . Hyperlipemia    PT DENIES  . Hypertension   . Hypogonadism male   . Impaired fasting glucose   . 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  . Pneumonia    " walking PNA > 40 years ago"  . Prostate cancer ([place]) [place] 2018  . Seasonal allergies   . Sleep apnea    wears CPAP ( set at 4-11)  . Trigger finger, left    , fourth finger    Patient Active Problem List   Diagnosis Date Noted  . It band syndrome, left 01/09/2018  . Malignant neoplasm of prostate  ([place]) 10/22/2017  . Chronic pain of left knee 09/30/2017  . Personal history of colonic polyps 09/26/2016  . Family history of colon cancer 09/26/2016  . Obesity 03/29/2015  . Tobacco use 03/29/2015  . 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 Depressive disorder, not elsewhere classified 12/13/2012  . Obstructive sleep apnea 12/13/2012  . Esophageal reflux 12/13/2012  . Impaired fasting glucose 12/13/2012  . Tobacco use disorder 12/13/2012  . 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 Gout, unspecified 12/13/2012  . Long term current use of anticoagulant therapy 12/13/2012  . Atrial fibrillation ([place]) 01/30/2011  . Bradycardia 01/30/2011  . Sleep apnea 01/30/2011  . Atrial flutter ([place]) 01/25/2011  . Essential hypertension, benign 01/25/2011  . Pure hypercholesterolemia 01/25/2011    Past Surgical History:  Procedure Laterality Date  . CARDIOVERSION N/A 05/29/2016   Procedure: CARDIOVERSION;  Surgeon: [name], MD;  Location: [facility];  Service: Cardiovascular;  Laterality: N/A;  . CATARACT EXTRACTION W/ INTRAOCULAR LENS  IMPLANT, BILATERAL  2016  . COLONOSCOPY WITH PROPOFOL N/A 09/26/2016   Procedure: COLONOSCOPY WITH PROPOFOL;  Surgeon: Wilford Corner, MD;  Location: [facility];  Service: Endoscopy;  Laterality: N/A;  . CYSTOSCOPY  01/23/2018   Procedure: CYSTOSCOPY FLEXIBLE;  Surgeon: Franchot Gallo, MD;  Location: [facility];  Service: Urology;;  no seeds found in bladder  . ESOPHAGOGASTRODUODENOSCOPY (EGD) WITH PROPOFOL N/A 09/26/2016   Procedure: ESOPHAGOGASTRODUODENOSCOPY (EGD) WITH PROPOFOL;  Surgeon: Wilford Corner, MD;  Location: [facility];  Service: Endoscopy;  Laterality: N/A;  . LIVER BIOPSY  MANY YRS AGO  . NASAL SINUS SURGERY  1997  . RADIOACTIVE SEED IMPLANT N/A 01/23/2018   Procedure: RADIOACTIVE SEED IMPLANT/BRACHYTHERAPY IMPLANT;  Surgeon: Franchot Gallo, MD;  Location: [facility];  Service:  Urology;  Laterality: N/A;   69 seeds implanted  . SPACE OAR INSTILLATION N/A 01/23/2018   Procedure: SPACE OAR INSTILLATION;  Surgeon: Franchot Gallo, MD;  Location: [facility];  Service: Urology;  Laterality: N/A;     OB History   No obstetric history on file.     Family History  Problem Relation Age of Onset  . Stroke Father   . Diabetes type II Father   . Colon cancer Father   . Heart disease Mother        with pacemaker  . Breast cancer Sister   . Diabetes type II Maternal Grandfather   . Leukemia Paternal Uncle   . Prostate cancer Other   . Pancreatic cancer Neg Hx     Social History   Tobacco Use  . Smoking status: Current Every Day Smoker    Packs/day: 0.25    Years: 50.00    Pack years: 12.50    Types: Cigarettes    Start date: 10/30/2013  . Smokeless tobacco: Former Systems developer    Types: Chew  . Tobacco comment: trying to quit 5 TO 6 CIGS PER DAY NOW  Substance Use Topics  . Alcohol use: Yes    Alcohol/week: 0.0 standard drinks    Comment: 2- glasses of red wine per night  . Drug use: No    Home Medications Prior to Admission medications   Medication Sig Start Date End Date Taking? Authorizing Provider  albuterol (PROVENTIL HFA;VENTOLIN HFA) 108 (90 Base) MCG/ACT inhaler Inhale 2 puffs into the lungs every 6 (six) hours as needed for wheezing or shortness of breath.    [provider]  allopurinol (ZYLOPRIM) 300 MG tablet Take 300 mg by mouth every evening.  08/03/15   [provider]  CALCIUM PO Take 200 mg by mouth 2 (two) times daily.    [provider]  Cholecalciferol (VITAMIN D3) 125 MCG (5000 UT) CAPS Take 1 capsule by mouth daily.    [provider]  COLCRYS 0.6 MG tablet Take 0.6 mg by mouth 2 (two) times daily as needed (for gout).  07/16/12   [provider]  diclofenac sodium (VOLTAREN) 1 % GEL Apply 1 application topically 4 (four) times daily as needed (for pain).  08/28/15   [provider]  fexofenadine (ALLEGRA) 60 MG tablet Take 60 mg by mouth daily as needed. For allergies    [provider]  flecainide (TAMBOCOR) 50 MG tablet TAKE 1 TABLET BY MOUTH 2 TIMES DAILY FOR THE HEART. 03/18/19   Jerline Pain, MD  fluticasone (FLONASE) 50 MCG/ACT nasal spray Place 1 spray into the nose every evening.     [provider]  FLUZONE HIGH-DOSE QUADRIVALENT 0.7 ML SUSY  01/01/19   [provider]  irbesartan (AVAPRO) 150 MG tablet TAKE 1 TABLET BY MOUTH DAILY. 08/26/18  Jerline Pain, MD  MAGNESIUM PO Take 2 capsules by mouth every evening.     [provider]  Menaquinone-7 (VITAMIN K2) 100 MCG CAPS Take by mouth daily.    [provider]  Misc Natural Products (LUTEIN 20) CAPS Take 40 mg by mouth 2 (two) times daily.     [provider]  NON FORMULARY CPAP    [provider]  TURMERIC PO Take 1 capsule by mouth 2 (two) times daily.     [provider]  vitamin B-12 (CYANOCOBALAMIN) 1000 MCG tablet Take 1,000 mcg by mouth daily.    [provider]  warfarin (COUMADIN) 5 MG tablet TAKE 2 TO 2.5 TABLETS BY MOUTH DAILY AS DIRECTED BY COUMADIN CLINIC 12/14/18   Jerline Pain, MD    Allergies    Clindamycin/lincomycin, Ciprofloxacin, and Amoxicillin  Review of Systems   Review of Systems  Constitutional: Negative for appetite change.  HENT: Positive for nosebleeds.   Cardiovascular: Negative for chest pain.  Gastrointestinal: Negative for abdominal pain.  Neurological: Negative for weakness and light-headedness.  Hematological: Bruises/bleeds easily.    Physical Exam Updated Vital Signs BP (!) 192/86 (BP Location: Left Arm)   Pulse 60   Temp (!) 97.5 F (36.4 C) (Oral)   Resp 16   SpO2 98%   Physical Exam Vitals and nursing note reviewed.  HENT:     Nose:     Comments: Dried blood in the medial aspect of the right nare. Cardiovascular:     Rate and Rhythm: Regular rhythm.   Skin:    General: Skin is warm.     Capillary Refill: Capillary refill takes less than 2 seconds.     Coloration: Skin is not pale.  Neurological:     Mental Status: He is alert.     ED Results / Procedures / Treatments   Labs (all labs ordered are listed, but only abnormal results are displayed) Labs Reviewed - No data to display  EKG None  Radiology No results found.  Procedures .Epistaxis Management  Date/Time: 04/20/2019 2:55 PM Performed by: Davonna Belling, MD Authorized by: Davonna Belling, MD   Consent:    Consent obtained:  Verbal   Consent given by:  Patient   Risks discussed:  Bleeding, infection, nasal injury and pain   Alternatives discussed:  No treatment Anesthesia (see MAR for exact dosages):    Anesthesia method:  Topical application   Topical anesthesia: afrin and 4% lido. Procedure details:    Treatment site:  L anterior   Treatment method:  Silver nitrate   Treatment complexity:  Limited   Treatment episode: initial   Post-procedure details:    Assessment:  Bleeding stopped   Patient tolerance of procedure:  Tolerated well, no immediate complications   (including critical care time)  Medications Ordered in ED Medications  silver nitrate applicators applicator 1 application (has no administration in time range)  oxymetazoline (AFRIN) 0.05 % nasal spray 1 spray (1 spray Each Nare Given by Other 04/20/19 1401)  lidocaine (XYLOCAINE) 4 % external solution ( Topical Given by Other 04/20/19 1404)    ED Course  I have reviewed the triage vital signs and the nursing notes.  Pertinent labs & imaging results that were available during my care of the patient were reviewed by me and considered in my medical decision making (see chart for details).    MDM Rules/Calculators/A&P  Patient with epistaxis..  The area of mildly active bleeding medially and inferiorly in the left nostril.  Cauterized with silver nitrate.  Bleeding  appears to stop.  Discharge home with ENT follow-up as needed.  INR is 2.0 checked earlier today. Final Clinical Impression(s) / ED Diagnoses Final diagnoses:  Anterior epistaxis    Rx / DC Orders ED Discharge Orders    None       Davonna Belling, MD 04/20/19 1456

## 2019-04-20 NOTE — ED Notes (Deleted)
Patient verbalizes understanding of discharge instructions. Opportunity for questioning and answers were provided. Armband removed by staff, pt discharged from ED.  

## 2019-04-20 NOTE — ED Notes (Signed)
Patient verbalizes understanding of discharge instructions. Opportunity for questioning and answers were provided. Armband removed by staff, pt discharged from ED.  

## 2019-05-14 DIAGNOSIS — I10 Essential (primary) hypertension: Secondary | ICD-10-CM | POA: Diagnosis not present

## 2019-05-14 DIAGNOSIS — C61 Malignant neoplasm of prostate: Secondary | ICD-10-CM | POA: Diagnosis not present

## 2019-05-14 DIAGNOSIS — F322 Major depressive disorder, single episode, severe without psychotic features: Secondary | ICD-10-CM | POA: Diagnosis not present

## 2019-05-14 DIAGNOSIS — I4891 Unspecified atrial fibrillation: Secondary | ICD-10-CM | POA: Diagnosis not present

## 2019-05-14 DIAGNOSIS — J4520 Mild intermittent asthma, uncomplicated: Secondary | ICD-10-CM | POA: Diagnosis not present

## 2019-05-14 DIAGNOSIS — E785 Hyperlipidemia, unspecified: Secondary | ICD-10-CM | POA: Diagnosis not present

## 2019-05-28 ENCOUNTER — Other Ambulatory Visit: Payer: Self-pay

## 2019-05-28 ENCOUNTER — Ambulatory Visit (HOSPITAL_COMMUNITY)
Admission: RE | Admit: 2019-05-28 | Discharge: 2019-05-28 | Disposition: A | Discharge status: Home / Self Care | Payer: PPO | Source: Ambulatory Visit | Attending: Internal Medicine | Admitting: Internal Medicine

## 2019-05-28 DIAGNOSIS — C61 Malignant neoplasm of prostate: Secondary | ICD-10-CM | POA: Diagnosis not present

## 2019-05-28 LAB — POCT HEMOGLOBIN-HEMACUE: Hemoglobin: 13.9 g/dL (ref 13.0–17.0)

## 2019-05-28 NOTE — Progress Notes (Signed)
Patient here for therapeutic phlebotomy. Per order to remove 500 ml if hemocue greater than 11, was 13.6 today. 500 ml blood removed from left antecubital area. VSS. Patient drinking water, tolerated well. DC home at this time.

## 2019-06-02 DIAGNOSIS — L820 Inflamed seborrheic keratosis: Secondary | ICD-10-CM | POA: Diagnosis not present

## 2019-06-02 DIAGNOSIS — Z85828 Personal history of other malignant neoplasm of skin: Secondary | ICD-10-CM | POA: Diagnosis not present

## 2019-06-02 DIAGNOSIS — L821 Other seborrheic keratosis: Secondary | ICD-10-CM | POA: Diagnosis not present

## 2019-06-02 DIAGNOSIS — L570 Actinic keratosis: Secondary | ICD-10-CM | POA: Diagnosis not present

## 2019-06-07 DIAGNOSIS — C61 Malignant neoplasm of prostate: Secondary | ICD-10-CM | POA: Diagnosis not present

## 2019-06-08 DIAGNOSIS — G4733 Obstructive sleep apnea (adult) (pediatric): Secondary | ICD-10-CM | POA: Diagnosis not present

## 2019-06-11 ENCOUNTER — Other Ambulatory Visit: Payer: Self-pay | Admitting: Internal Medicine

## 2019-06-11 DIAGNOSIS — I10 Essential (primary) hypertension: Secondary | ICD-10-CM | POA: Diagnosis not present

## 2019-06-11 DIAGNOSIS — I77811 Abdominal aortic ectasia: Secondary | ICD-10-CM

## 2019-06-11 DIAGNOSIS — J4520 Mild intermittent asthma, uncomplicated: Secondary | ICD-10-CM | POA: Diagnosis not present

## 2019-06-11 DIAGNOSIS — F17210 Nicotine dependence, cigarettes, uncomplicated: Secondary | ICD-10-CM | POA: Diagnosis not present

## 2019-06-11 DIAGNOSIS — H40013 Open angle with borderline findings, low risk, bilateral: Secondary | ICD-10-CM | POA: Diagnosis not present

## 2019-06-11 DIAGNOSIS — I4891 Unspecified atrial fibrillation: Secondary | ICD-10-CM | POA: Diagnosis not present

## 2019-06-14 ENCOUNTER — Ambulatory Visit (INDEPENDENT_AMBULATORY_CARE_PROVIDER_SITE_OTHER): Payer: PPO | Admitting: Pharmacist

## 2019-06-14 ENCOUNTER — Other Ambulatory Visit: Payer: Self-pay

## 2019-06-14 DIAGNOSIS — I480 Paroxysmal atrial fibrillation: Secondary | ICD-10-CM | POA: Diagnosis not present

## 2019-06-14 LAB — POCT INR: INR: 2.1 (ref 2.0–3.0)

## 2019-06-14 NOTE — Patient Instructions (Signed)
Description   Continue on same dosage 2 tablets daily except 2.5 tablets on Mondays, Wednesdays and Fridays. Recheck in 8 weeks. Call if placed on any new medications [phone].

## 2019-06-16 ENCOUNTER — Ambulatory Visit (INDEPENDENT_AMBULATORY_CARE_PROVIDER_SITE_OTHER): Payer: PPO | Admitting: Family Medicine

## 2019-06-16 ENCOUNTER — Ambulatory Visit: Payer: Self-pay

## 2019-06-16 ENCOUNTER — Other Ambulatory Visit: Payer: Self-pay

## 2019-06-16 ENCOUNTER — Encounter: Payer: Self-pay | Admitting: Family Medicine

## 2019-06-16 DIAGNOSIS — I4891 Unspecified atrial fibrillation: Secondary | ICD-10-CM | POA: Diagnosis not present

## 2019-06-16 DIAGNOSIS — F322 Major depressive disorder, single episode, severe without psychotic features: Secondary | ICD-10-CM | POA: Diagnosis not present

## 2019-06-16 DIAGNOSIS — M25561 Pain in right knee: Secondary | ICD-10-CM

## 2019-06-16 DIAGNOSIS — C61 Malignant neoplasm of prostate: Secondary | ICD-10-CM | POA: Diagnosis not present

## 2019-06-16 DIAGNOSIS — F329 Major depressive disorder, single episode, unspecified: Secondary | ICD-10-CM | POA: Diagnosis not present

## 2019-06-16 DIAGNOSIS — E785 Hyperlipidemia, unspecified: Secondary | ICD-10-CM | POA: Diagnosis not present

## 2019-06-16 DIAGNOSIS — G8929 Other chronic pain: Secondary | ICD-10-CM

## 2019-06-16 DIAGNOSIS — I10 Essential (primary) hypertension: Secondary | ICD-10-CM | POA: Diagnosis not present

## 2019-06-16 DIAGNOSIS — J4520 Mild intermittent asthma, uncomplicated: Secondary | ICD-10-CM | POA: Diagnosis not present

## 2019-06-16 NOTE — Progress Notes (Signed)
Office Visit Note   Patient: Jonathan Vargas           Date of Birth: 27-Dec-1945           MRN: [id number] Visit Date: 06/16/2019 Requested by: Leeroy Cha, MD [address] [place],  [place] [id number] PCP: Leeroy Cha, MD  Subjective: Chief Complaint  Patient presents with  . Right Knee - Pain, Follow-up    Continues to have intermittent soreness in medial aspect of knee. Has been using recumbent bike at the gym, instead of walking. Occasional short-lived pain with this.    HPI: He is here with persistent right knee troubles.  He has been modifying his activities but he still feels intermittent soreness on the medial side of his knee.  No locking or giving way, no swelling.  His last MRI scan showed medial femoral condyle stress fracture versus osteonecrosis.              ROS:   All other systems were reviewed and are negative.  Objective: Vital Signs: There were no vitals taken for this visit.  Physical Exam:  General:  Alert and oriented, in no acute distress. Pulm:  Breathing unlabored. Psy:  Normal mood, congruent affect.  Right knee: No effusion, ligaments are stable.  He still tender to palpation over the medial femoral condyle.  No significant joint line tenderness, no pain or click with McMurray's.  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    Assessment & Plan: 1.  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 Marlou Sa to evaluate x-rays and if he feels it is indicated, I will set patient up to meet with Dr. Marlou Sa to discuss possible arthroscopic intervention.     Procedures: No procedures performed  No notes on file     PMFS History: Patient Active Problem List   Diagnosis Date Noted  . It band syndrome, left 01/09/2018  . Malignant neoplasm of prostate ([place]) 10/22/2017  .  Dupuytren's contracture of left hand 10/09/2017  . Chronic pain of left knee 09/30/2017  . Ear pain, left 05/17/2017  . Personal history of colonic polyps 09/26/2016  . Family history of colon cancer 09/26/2016  . Chronic frontal sinusitis 12/05/2015  . Chronic sinusitis 11/03/2015  . Obesity 03/29/2015  . Tobacco use 03/29/2015  . 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 Depressive disorder, not elsewhere classified 12/13/2012  . Obstructive sleep apnea 12/13/2012  . Esophageal reflux 12/13/2012  . Impaired fasting glucose 12/13/2012  . Tobacco use disorder 12/13/2012  . 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 Gout, unspecified 12/13/2012  . Long term current use of anticoagulant therapy 12/13/2012  . Atrial fibrillation ([place]) 01/30/2011  . Bradycardia 01/30/2011  . Sleep apnea 01/30/2011  . Atrial flutter ([place]) 01/25/2011  . Essential hypertension, benign 01/25/2011  . Pure hypercholesterolemia 01/25/2011   Past Medical History:  Diagnosis Date  . Acid reflux   . Arthritis   . ASD (atrial septal defect)   . Asthma with allergic rhinitis and status asthmaticus    NO RECENT FLARE UPS  . Atrial fib/flutter, transient   . Cancer ([place])    skin  . Chronic anticoagulation   . Depression   . Dysrhythmia    ATRIAL FIB  . ENT complaint    Dr. Erik Obey  . Erectile dysfunction   .  Fatty liver   . FHx: allergies   . Gout    , Possible, Right Knee x2 ; intermittent milder episodes of pain in hte first MTP - uric acid 7.7- no treatment so far.  . Hemochromatosis    DOES TWICE YEAR  . History of kidney stones    SMALL IN RIGHT KIDNEY  . Hypercholesteremia   . Hyperlipemia    PT DENIES  . Hypertension   . Hypogonadism male   . Impaired fasting glucose   . 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  . Pneumonia    " walking PNA > 40 years ago"  . Prostate cancer ([place]) [place] 2018  . Seasonal  allergies   . Sleep apnea    wears CPAP ( set at 4-11)  . Trigger finger, left    , fourth finger    Family History  Problem Relation Age of Onset  . Stroke Father   . Diabetes type II Father   . Colon cancer Father   . Heart disease Mother        with pacemaker  . Breast cancer Sister   . Diabetes type II Maternal Grandfather   . Leukemia Paternal Uncle   . Prostate cancer Other   . Pancreatic cancer Neg Hx     Past Surgical History:  Procedure Laterality Date  . CARDIOVERSION N/A 05/29/2016   Procedure: CARDIOVERSION;  Surgeon: [name], MD;  Location: [facility];  Service: Cardiovascular;  Laterality: N/A;  . CATARACT EXTRACTION W/ INTRAOCULAR LENS  IMPLANT, BILATERAL  2016  . COLONOSCOPY WITH PROPOFOL N/A 09/26/2016   Procedure: COLONOSCOPY WITH PROPOFOL;  Surgeon: Wilford Corner, MD;  Location: [facility];  Service: Endoscopy;  Laterality: N/A;  . CYSTOSCOPY  01/23/2018   Procedure: CYSTOSCOPY FLEXIBLE;  Surgeon: Franchot Gallo, MD;  Location[facility];  Service: Urology;;  no seeds found in bladder  . ESOPHAGOGASTRODUODENOSCOPY (EGD) WITH PROPOFOL N/A 09/26/2016   Procedure: ESOPHAGOGASTRODUODENOSCOPY (EGD) WITH PROPOFOL;  Surgeon: Wilford Corner, MD;  Location: [facility];  Service: Endoscopy;  Laterality: N/A;  . LIVER BIOPSY  MANY YRS AGO  . NASAL SINUS SURGERY  1997  . RADIOACTIVE SEED IMPLANT N/A 01/23/2018   Procedure: RADIOACTIVE SEED IMPLANT/BRACHYTHERAPY IMPLANT;  Surgeon: Franchot Gallo, MD;  Location: [facility];  Service: Urology;  Laterality: N/A;   69 seeds implanted  . SPACE OAR INSTILLATION N/A 01/23/2018   Procedure: SPACE OAR INSTILLATION;  Surgeon: Franchot Gallo, MD;  Location: [facility];  Service: Urology;  Laterality: N/A;   Social History   Occupational History  . Occupation: retired  Tobacco Use  . Smoking status: Current Every Day Smoker    Packs/day: 0.25    Years:  50.00    Pack years: 12.50    Types: Cigarettes    Start date: 10/30/2013  . Smokeless tobacco: Former Systems developer    Types: Chew  . Tobacco comment: trying to quit 5 TO 6 CIGS PER DAY NOW  Substance and Sexual Activity  . Alcohol use: Yes    Alcohol/week: 0.0 standard drinks    Comment: 2- glasses of red wine per night  . Drug use: No  . Sexual activity: Not Currently

## 2019-06-23 ENCOUNTER — Encounter: Payer: Self-pay | Admitting: Family Medicine

## 2019-06-28 ENCOUNTER — Ambulatory Visit: Payer: PPO | Admitting: Orthopedic Surgery

## 2019-06-28 ENCOUNTER — Other Ambulatory Visit: Payer: Self-pay

## 2019-06-28 DIAGNOSIS — M84453A Pathological fracture, unspecified femur, initial encounter for fracture: Secondary | ICD-10-CM

## 2019-06-29 ENCOUNTER — Ambulatory Visit
Admission: RE | Admit: 2019-06-29 | Discharge: 2019-06-29 | Disposition: A | Discharge status: Home / Self Care | Payer: PPO | Source: Ambulatory Visit | Attending: Internal Medicine | Admitting: Internal Medicine

## 2019-06-29 ENCOUNTER — Encounter: Payer: Self-pay | Admitting: Orthopedic Surgery

## 2019-06-29 DIAGNOSIS — K760 Fatty (change of) liver, not elsewhere classified: Secondary | ICD-10-CM | POA: Diagnosis not present

## 2019-06-29 DIAGNOSIS — N281 Cyst of kidney, acquired: Secondary | ICD-10-CM | POA: Diagnosis not present

## 2019-06-29 DIAGNOSIS — I77811 Abdominal aortic ectasia: Secondary | ICD-10-CM

## 2019-06-29 MED ORDER — IOPAMIDOL (ISOVUE-300) INJECTION 61%
125.0000 mL | Freq: Once | INTRAVENOUS | Status: AC | PRN
  Administered 2019-06-29: 125 mL via INTRAVENOUS

## 2019-06-29 NOTE — Telephone Encounter (Signed)
Hi reviewed the radiographs from 331 and from January.  Looks like the craterization of that medial femoral condyle is a little bit more clear to see now than it was 3 months ago.  Other than that they essentially look the same.

## 2019-06-30 ENCOUNTER — Other Ambulatory Visit: Payer: Self-pay | Admitting: Cardiology

## 2019-07-02 ENCOUNTER — Encounter: Payer: Self-pay | Admitting: Orthopedic Surgery

## 2019-07-02 NOTE — Progress Notes (Signed)
Office Visit Note   Patient: Jonathan Vargas           Date of Birth: 1945/09/06           MRN: [id number] Visit Date: 06/28/2019 Requested by: Leeroy Cha, MD [address] [place],  [place] [address] PCP: Leeroy Cha, MD  Subjective: Chief Complaint  Patient presents with  . Right Knee - Pain    HPI: Jonathan Vargas is a 74 y.o. adult who presents to the office complaining of right knee pain.  Patient presents referred from Dr. Junius Roads.  He had MRI of his right knee in January that revealed slight collapse of the medial femoral condyle subchondral insufficiency fracture with early osteonecrosis.  This happened after he stepped up onto a sidewalk and felt a pop.  He localizes all of his pain to the medial aspect of the knee.  He has no history of right knee surgery.  He notes occasional pain with walking and when laying in bed but pain does not wake him up at night.  He does have a history of a DEXA scan in late 2020 that showed improving osteopenia.  He is on a vitamin regimen to improve his bone quality without taking any medications.  He works out often at [employer] where he uses a recumbent bicycle.  He has a history of 3 stress fractures, one in the left knee and 2 in the right knee..                ROS:  All systems reviewed are negative as they relate to the chief complaint within the history of present illness.  Patient denies fevers or chills.  Assessment & Plan: Visit Diagnoses:  1. Insufficiency fracture of medial femoral condyle (HCC)     Plan: Patient is a 74 year old male who presents complaining of right knee pain.  He has a subchondral insufficiency fracture of the right medial femoral condyle.  He has a history of osteopenia and multiple stress fractures in both knees.  All in all, he notes some occasional pain with walking and some soreness but nothing that is debilitating.  He is able to perform his activities of daily living without  significant issue.  I do not think any surgical intervention is appropriate for this patient at this time.  Plan for patient to follow-up in 8 weeks for clinical recheck.  Follow-Up Instructions: No follow-ups on file.   Orders:  No orders of the defined types were placed in this encounter.  No orders of the defined types were placed in this encounter.     Procedures: No procedures performed   Clinical Data: No additional findings.  Objective: Vital Signs: There were no vitals taken for this visit.  Physical Exam:  Constitutional: Patient appears well-developed HEENT:  Head: Normocephalic Eyes:EOM are normal Neck: Normal range of motion Cardiovascular: Normal rate Pulmonary/chest: Effort normal Neurologic: Patient is alert Skin: Skin is warm Psychiatric: Patient has normal mood and affect  Ortho Exam:  Right knee Exam Tender to palpation over the medial joint line No effusion Extensor mechanism intact No TTP over the lateral jointlines, quad tendon, patellar tendon, pes anserinus, patella, tibial tubercle, LCL/MCL insertions Stable to varus/valgus stresses.  Stable to anterior/posterior drawer Extension to 0 degrees Flexion > 90 degrees  Specialty Comments:  No specialty comments available.  Imaging: No results found.   PMFS History: Patient Active Problem List   Diagnosis Date Noted  . It band syndrome,  left 01/09/2018  . Malignant neoplasm of prostate ([facility]) 10/22/2017  . Dupuytren's contracture of left hand 10/09/2017  . Chronic pain of left knee 09/30/2017  . Ear pain, left 05/17/2017  . Personal history of colonic polyps 09/26/2016  . Family history of colon cancer 09/26/2016  . Chronic frontal sinusitis 12/05/2015  . Chronic sinusitis 11/03/2015  . Obesity 03/29/2015  . Tobacco use 03/29/2015  . 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 Depressive disorder, not elsewhere classified 12/13/2012  . Obstructive sleep apnea 12/13/2012  .  Esophageal reflux 12/13/2012  . Impaired fasting glucose 12/13/2012  . Tobacco use disorder 12/13/2012  . 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 Gout, unspecified 12/13/2012  . Long term current use of anticoagulant therapy 12/13/2012  . Atrial fibrillation ([place]) 01/30/2011  . Bradycardia 01/30/2011  . Sleep apnea 01/30/2011  . Atrial flutter ([place]) 01/25/2011  . Essential hypertension, benign 01/25/2011  . Pure hypercholesterolemia 01/25/2011   Past Medical History:  Diagnosis Date  . Acid reflux   . Arthritis   . ASD (atrial septal defect)   . Asthma with allergic rhinitis and status asthmaticus    NO RECENT FLARE UPS  . Atrial fib/flutter, transient   . Cancer ([place])    skin  . Chronic anticoagulation   . Depression   . Dysrhythmia    ATRIAL FIB  . ENT complaint    Dr. Erik Obey  . Erectile dysfunction   . Fatty liver   . FHx: allergies   . Gout    , Possible, Right Knee x2 ; intermittent milder episodes of pain in hte first MTP - uric acid 7.7- no treatment so far.  . Hemochromatosis    DOES TWICE YEAR  . History of kidney stones    SMALL IN RIGHT KIDNEY  . Hypercholesteremia   . Hyperlipemia    PT DENIES  . Hypertension   . Hypogonadism male   . Impaired fasting glucose   . 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  . Pneumonia    " walking PNA > 40 years ago"  . Prostate cancer ([place]) [place] 2018  . Seasonal allergies   . Sleep apnea    wears CPAP ( set at 4-11)  . Trigger finger, left    , fourth finger    Family History  Problem Relation Age of Onset  . Stroke Father   . Diabetes type II Father   . Colon cancer Father   . Heart disease Mother        with pacemaker  . Breast cancer Sister   . Diabetes type II Maternal Grandfather   . Leukemia Paternal Uncle   . Prostate cancer Other   . Pancreatic cancer Neg Hx     Past Surgical History:  Procedure Laterality Date  .  CARDIOVERSION N/A 05/29/2016   Procedure: CARDIOVERSION;  Surgeon: [name], MD;  Location: [facility];  Service: Cardiovascular;  Laterality: N/A;  . CATARACT EXTRACTION W/ INTRAOCULAR LENS  IMPLANT, BILATERAL  2016  . COLONOSCOPY WITH PROPOFOL N/A 09/26/2016   Procedure: COLONOSCOPY WITH PROPOFOL;  Surgeon: Wilford Corner, MD;  Location: [facility];  Service: Endoscopy;  Laterality: N/A;  . CYSTOSCOPY  01/23/2018   Procedure: CYSTOSCOPY FLEXIBLE;  Surgeon: Franchot Gallo, MD;  Location: [facility];  Service: Urology;;  no seeds found in bladder  . ESOPHAGOGASTRODUODENOSCOPY (EGD) WITH PROPOFOL N/A 09/26/2016   Procedure: ESOPHAGOGASTRODUODENOSCOPY (EGD) WITH PROPOFOL;  Surgeon: Wilford Corner,  MD;  Location: [facility];  Service: Endoscopy;  Laterality: N/A;  . LIVER BIOPSY  MANY YRS AGO  . NASAL SINUS SURGERY  1997  . RADIOACTIVE SEED IMPLANT N/A 01/23/2018   Procedure: RADIOACTIVE SEED IMPLANT/BRACHYTHERAPY IMPLANT;  Surgeon: Franchot Gallo, MD;  [facility];  Service: Urology;  Laterality: N/A;   69 seeds implanted  . SPACE OAR INSTILLATION N/A 01/23/2018   Procedure: SPACE OAR INSTILLATION;  Surgeon: Franchot Gallo, MD;  Location[facility]  Service: Urology;  Laterality: N/A;   Social History   Occupational History  . Occupation: retired  Tobacco Use  . Smoking status: Current Every Day Smoker    Packs/day: 0.25    Years: 50.00    Pack years: 12.50    Types: Cigarettes    Start date: 10/30/2013  . Smokeless tobacco: Former Systems developer    Types: Chew  . Tobacco comment: trying to quit 5 TO 6 CIGS PER DAY NOW  Substance and Sexual Activity  . Alcohol use: Yes    Alcohol/week: 0.0 standard drinks    Comment: 2- glasses of red wine per night  . Drug use: No  . Sexual activity: Not Currently

## 2019-07-07 DIAGNOSIS — R739 Hyperglycemia, unspecified: Secondary | ICD-10-CM | POA: Diagnosis not present

## 2019-07-07 DIAGNOSIS — E871 Hypo-osmolality and hyponatremia: Secondary | ICD-10-CM | POA: Diagnosis not present

## 2019-07-21 DIAGNOSIS — Z6829 Body mass index (BMI) 29.0-29.9, adult: Secondary | ICD-10-CM | POA: Diagnosis not present

## 2019-07-21 DIAGNOSIS — E663 Overweight: Secondary | ICD-10-CM | POA: Diagnosis not present

## 2019-07-21 DIAGNOSIS — M150 Primary generalized (osteo)arthritis: Secondary | ICD-10-CM | POA: Diagnosis not present

## 2019-07-21 DIAGNOSIS — M1A09X0 Idiopathic chronic gout, multiple sites, without tophus (tophi): Secondary | ICD-10-CM | POA: Diagnosis not present

## 2019-07-21 NOTE — Progress Notes (Signed)
CARDIOLOGY OFFICE NOTE  Date:  07/26/2019    Jonathan Vargas Date of Birth: September 19, 1945 Medical Record [id number]  PCP:  Leeroy Cha, MD  Cardiologist:  Servando Snare & [facility]/Allred   Chief Complaint  Patient presents with  . Follow-up    History of Present Illness: Jonathan Vargas is a 74 y.o. adult who presents today for a 4 month check. Seen for Dr. Marlou Porch &Allred.  [name]".  He has a history ofatrial fibrillation/flutter paroxysmal on antiarrhythmic and is onchronicwarfarin for his anticoagulation. Had stress test 09/01/12 that demonstrated a mild inferior wall defect, possible diaphragmatic attenuation, overall low risk study with normal ejection fraction. Other issues include HTN, prior carotid artery dissection in 1997, ongoing tobacco abuse, hemachromatosis. Previous hospitalization in November 2012 demonstrated paroxysmal atrial flutter which converted after 4 hours of diltiazem. He had a postconversion pause of 8.2 seconds, felt flushed. Endorses chronic palpitations.   I saw him as a work in back in [place]- out of rhythm. Somewhat symptomatic. Got him cardioverted and back to see [name] for discussion of other options - but opted for his continued regimen. ARB was restarted for elevated BP. Patient elected to continue CCB therapy despite bradycardia.He has continued on his anticoagulation with coumadin.   Dr. Marlou Porch and I have followed him since. He has had intermittent issues with his BP. He has ongoing use of alcohol. He has had to have CCB eventually stopped due to presyncope.There was concern for post conversion pauses which has been documented in the past.  He has actually made good progress over the past couple of years with losing weight - after going to diabetic classes. Has had some atypical chest pain - has needed clearance for GU surgery and we have updated his stress test (October 2019) which was reassuring.  Still  smoking a few cigarettes and alcohol use continues. Cardiac status has been ok.Last seen back in January - [facility] per his report.   The patient does not have symptoms concerning for COVID-19 infection (fever, chills, cough, or new shortness of breath).   Comes in today. Here alone. [facility] 7 days a week - really enjoys this. No chest pain. Breathing is good. Has had some lightheaded spells - actually had one that happened yesterday - Felt "off" - did not pass out - thought his heart rate was low. Felt like he was "swaying". Did not fall. This lasted for several hours. Little better today. HR is 51 today. He did not think to check his BP/HR yesterday. [facility] - HR may have been in the 40's by their tracker. Remains on his Coumadin. Not on any rate slowing medicines and is just on Tambocor for his PAF. Still smoking - 5 cigs per day - still with 2 glasses of wine a night. He has noted that he has had to use his albuterol more.   Past Medical History:  Diagnosis Date  . Acid reflux   . Arthritis   . ASD (atrial septal defect)   . Asthma with allergic rhinitis and status asthmaticus    NO RECENT FLARE UPS  . Atrial fib/flutter, transient   . Cancer ([place])    skin  . Chronic anticoagulation   . Depression   . Dysrhythmia    ATRIAL FIB  . ENT complaint    Dr.  Wolicki  . Erectile dysfunction   . Fatty liver   . FHx: allergies   . Gout    , Possible, Right Knee x2 ; intermittent milder episodes of pain in hte first MTP - uric acid 7.7- no treatment so far.  . Hemochromatosis    DOES TWICE YEAR  . History of kidney stones    SMALL IN RIGHT KIDNEY  . Hypercholesteremia   . Hyperlipemia    PT DENIES  . Hypertension   . Hypogonadism male   . Impaired fasting glucose   . 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  . Pneumonia     " walking PNA > 40 years ago"  . Prostate cancer ([place]) [place] 2018  . Seasonal allergies   . Sleep apnea    wears CPAP ( set at 4-11)  . Trigger finger, left    , fourth finger    Past Surgical History:  Procedure Laterality Date  . CARDIOVERSION N/A 05/29/2016   Procedure: CARDIOVERSION;  Surgeon: [name], MD;  Location: [facility];  Service: Cardiovascular;  Laterality: N/A;  . CATARACT EXTRACTION W/ INTRAOCULAR LENS  IMPLANT, BILATERAL  2016  . COLONOSCOPY WITH PROPOFOL N/A 09/26/2016   Procedure: COLONOSCOPY WITH PROPOFOL;  Surgeon: Wilford Corner, MD;  Location: [facility];  Service: Endoscopy;  Laterality: N/A;  . CYSTOSCOPY  01/23/2018   Procedure: CYSTOSCOPY FLEXIBLE;  Surgeon: Franchot Gallo, MD;  Location[facility];  Service: Urology;;  no seeds found in bladder  . ESOPHAGOGASTRODUODENOSCOPY (EGD) WITH PROPOFOL N/A 09/26/2016   Procedure: ESOPHAGOGASTRODUODENOSCOPY (EGD) WITH PROPOFOL;  Surgeon: Wilford Corner, MD;  Location: [facility];  Service: Endoscopy;  Laterality: N/A;  . LIVER BIOPSY  MANY YRS AGO  . NASAL SINUS SURGERY  1997  . RADIOACTIVE SEED IMPLANT N/A 01/23/2018   Procedure: RADIOACTIVE SEED IMPLANT/BRACHYTHERAPY IMPLANT;  Surgeon: Franchot Gallo, MD;  Location: [facility];  Service: Urology;  Laterality: N/A;   69 seeds implanted  . SPACE OAR INSTILLATION N/A 01/23/2018   Procedure: SPACE OAR INSTILLATION;  Surgeon: Franchot Gallo, MD;  Location: [facility];  Service: Urology;  Laterality: N/A;     Medications: Current Meds  Medication Sig  . albuterol (PROVENTIL HFA;VENTOLIN HFA) 108 (90 Base) MCG/ACT inhaler Inhale 2 puffs into the lungs every 6 (six) hours as needed for wheezing or shortness of breath.  . allopurinol (ZYLOPRIM) 300 MG tablet Take 300 mg by mouth every evening.   Marland Kitchen CALCIUM PO Take 200 mg by mouth in the morning, at noon, and at bedtime.   . Cholecalciferol (VITAMIN  D3) 125 MCG (5000 UT) CAPS Take 1 capsule by mouth daily.  Marland Kitchen COLCRYS 0.6 MG tablet Take 0.6 mg by mouth 2 (two) times daily as needed (for gout).   Marland Kitchen diclofenac sodium (VOLTAREN) 1 % GEL Apply 1 application topically 4 (four) times daily as needed (for pain).   . fexofenadine (ALLEGRA) 60 MG tablet Take 60 mg by mouth daily as needed. For allergies  . flecainide (TAMBOCOR) 50 MG tablet TAKE 1 TABLET BY MOUTH 2 TIMES DAILY FOR THE HEART.  . fluticasone (FLONASE) 50 MCG/ACT nasal spray Place 1 spray into the nose every evening.   . irbesartan (AVAPRO) 150 MG tablet TAKE 1 TABLET BY MOUTH DAILY.  Marland Kitchen MAGNESIUM PO Take 4 capsules by mouth every evening.   . Menaquinone-7 (VITAMIN K2) 100 MCG CAPS Take by mouth daily.  . Misc Natural Products (LUTEIN  20) CAPS Take 40 mg by mouth 2 (two) times daily.   . NON FORMULARY CPAP  . TURMERIC PO Take 1 capsule by mouth 2 (two) times daily.   . vitamin B-12 (CYANOCOBALAMIN) 1000 MCG tablet Take 1,000 mcg by mouth daily.  Marland Kitchen warfarin (COUMADIN) 5 MG tablet TAKE 2 TO 2 AND 1/2 TABLETS BY MOUTH AS DIRECTED BT COUMADIN CLINIC     Allergies: Allergies  Allergen Reactions  . Clindamycin/Lincomycin Diarrhea and Other (See Comments)    Stomach pain   . Ciprofloxacin Other (See Comments)    Joint pain  . Amoxicillin Rash    Social History: The patient  reports that he has been smoking cigarettes. He started smoking about 5 years ago. He has a 12.50 pack-year smoking history. He has quit using smokeless tobacco.  His smokeless tobacco use included chew. He reports current alcohol use. He reports that he does not use drugs.   Family History: The patient's family history includes Breast cancer in his sister; Colon cancer in his father; Diabetes type II in his father and maternal grandfather; Heart disease in his mother; Leukemia in his paternal uncle; Prostate cancer in an other family member; Stroke in his father.   Review of Systems: Please see the history of  present illness.   All other systems are reviewed and negative.   Physical Exam: VS:  BP 126/82   Pulse (!) 55   Ht 6\' 2"  (1.88 m)   Wt 211 lb 1.9 oz (95.8 kg)   SpO2 96%   BMI 27.11 kg/m  .  BMI Body mass index is 27.11 kg/m.  Wt Readings from Last 3 Encounters:  07/26/19 211 lb 1.9 oz (95.8 kg)  05/28/19 209 lb (94.8 kg)  03/29/19 213 lb (96.6 kg)    General: Alert and in no acute distress.   HEENT: Normal.  Neck: Supple, no JVD, carotid bruits, or masses noted.  Cardiac: Regular rate and rhythm. Little slow but stable. No edema.  Respiratory:  Lungs are clear to auscultation bilaterally with normal work of breathing.  GI: Soft and nontender.  MS: No deformity or atrophy. Gait and ROM intact.  Skin: Warm and dry. Color is normal.  Neuro:  Strength and sensation are intact and no gross focal deficits noted.  Psych: Alert, appropriate and with normal affect.   LABORATORY DATA:  EKG:  EKG is ordered today.  Personally reviewed by me. This demonstrates sinus bradycardia - HR is 51 today.  Lab Results  Component Value Date   WBC 6.3 01/16/2018   HGB 13.9 05/28/2019   HCT 44.5 01/16/2018   PLT 264 01/16/2018   GLUCOSE 85 01/16/2018   ALT 16 01/16/2018   AST 19 01/16/2018   NA 135 01/16/2018   K 4.4 01/16/2018   CL 102 01/16/2018   CREATININE 0.94 01/16/2018   BUN 24 (H) 01/16/2018   CO2 25 01/16/2018   TSH 3.400 05/27/2016   INR 2.1 06/14/2019       BNP (last 3 results) No results for input(s): BNP in the last 8760 hours.  ProBNP (last 3 results) No results for input(s): PROBNP in the last 8760 hours.   Other Studies Reviewed Today:  MyoviewStudy Highlights10/2019   Nuclear stress EF: 55%.  There was no ST segment deviation noted during stress.  The study is normal.  This is a low risk study.  The left ventricular ejection fraction is normal (55-65%).    Echo Study Conclusions 05/2016  - Left ventricle:  The cavity size was normal. Wall  thickness was increased in a pattern of mild LVH. Systolic function was normal. The estimated ejection fraction was in the range of 55% to 60%. Wall motion was normal; there were no regional wall motion abnormalities. Left ventricular diastolic function parameters were normal. - Left atrium: The atrium was moderately dilated. - Atrial septum: No defect or patent foramen ovale was identified.  Procedure: Electrical Cardioversion Indications:Atrial Flutter  Procedure Details Consent: Risks of procedure as well as the alternatives and risks of each were explained to the (patient/caregiver). Consent for procedure obtained. Time [id number] patient identification, verified procedure, site/side was marked, verified correct patient position, special equipment/implants available, medications/allergies/relevent history reviewed, required imaging and test results available. Performed  Patient placed on cardiac monitor, pulse oximetry, supplemental oxygen as necessary.  Sedation given: Propofol 70 mg Pacer pads placed anterior and posterior chest. Cardioverted 1time(s).  Cardioverted at 120J. Evaluation Findings: Post procedure EKG shows: NSR Complications: None Patient didtolerate procedure well.  [name], MD 05/29/2016, 1:53 PM   ASSESSMENT & PLAN:   1. Persistent bradycardia - HR is 51 - having what sounds like more spells of lightheaded/pre syncope - will arrange for 14 day monitor. Lab today as well to make sure no other issues going on. May need PPM at some point.   2. PAF/flutter - in sinus today - last cardioversion in 2018 - has seen EP in the past - he remains on AAD therapy as well - see #1.   3. HTN - BP is good here today - no changes made today. Only on ARB therapy.   4. Chronic anticoagulation - no problems noted.   5. Obesity - he continues to work on weight loss and really enjoys his exercise program.   6. Substance abuse - continues  - 2 glasses of wine a night and 5 cigs per day.   7. COVID-19 Education: The signs and symptoms of COVID-19 were discussed with the patient and how to seek care for testing (follow up with PCP or arrange E-visit).  The importance of social distancing, staying at home, hand hygiene and wearing a mask when out in public were discussed today.  Current medicines are reviewed with the patient today.  The patient does not have concerns regarding medicines other than what has been noted above.  The following changes have been made:  See above.  Labs/ tests ordered today include:    Orders Placed This Encounter  Procedures  . Basic metabolic panel  . CBC  . Hepatic function panel  . Lipid panel  . TSH  . LONG TERM MONITOR (3-14 DAYS)  . EKG 12-Lead     Disposition:   FU with [place] in about 4 months. Will arrange for 14 day monitor. Lab today.    Patient is agreeable to this plan and will call if any problems develop in the interim.   SignedTruitt Merle, NP  07/26/2019 [facility] [address] [place], [place]  [address] Phone: [phone] Fax: [phone]

## 2019-07-26 ENCOUNTER — Other Ambulatory Visit: Payer: Self-pay

## 2019-07-26 ENCOUNTER — Encounter: Payer: Self-pay | Admitting: Nurse Practitioner

## 2019-07-26 ENCOUNTER — Ambulatory Visit: Payer: PPO | Admitting: Nurse Practitioner

## 2019-07-26 ENCOUNTER — Ambulatory Visit (INDEPENDENT_AMBULATORY_CARE_PROVIDER_SITE_OTHER): Payer: PPO

## 2019-07-26 VITALS — BP 126/82 | HR 55 | Ht 74.0 in | Wt 211.1 lb

## 2019-07-26 DIAGNOSIS — R42 Dizziness and giddiness: Secondary | ICD-10-CM | POA: Diagnosis not present

## 2019-07-26 DIAGNOSIS — Z79899 Other long term (current) drug therapy: Secondary | ICD-10-CM

## 2019-07-26 DIAGNOSIS — I10 Essential (primary) hypertension: Secondary | ICD-10-CM | POA: Diagnosis not present

## 2019-07-26 DIAGNOSIS — Z7901 Long term (current) use of anticoagulants: Secondary | ICD-10-CM | POA: Diagnosis not present

## 2019-07-26 DIAGNOSIS — R001 Bradycardia, unspecified: Secondary | ICD-10-CM | POA: Diagnosis not present

## 2019-07-26 DIAGNOSIS — I480 Paroxysmal atrial fibrillation: Secondary | ICD-10-CM

## 2019-07-26 NOTE — Patient Instructions (Addendum)
After Visit Summary:  We will be checking the following labs today - BMET, CBC, HPF, Lipids and TSH   Medication Instructions:    Continue with your current medicines.    If you need a refill on your cardiac medications before your next appointment, please call your pharmacy.     Testing/Procedures To Be Arranged:  14 day monitor   Follow-Up:   See me in about 4 months    At [facility], you and your health needs are our priority.  As part of our continuing mission to provide you with exceptional heart care, we have created designated Provider Care Teams.  These Care Teams include your primary Cardiologist (physician) and Advanced Practice Providers (APPs -  Physician Assistants and Nurse Practitioners) who all work together to provide you with the care you need, when you need it.  Special Instructions:  . Stay safe, stay home, wash your hands for at least 20 seconds and wear a mask when out in public.  . It was good to talk with you today.   ZIO XT- Long Term Monitor Instructions   Your physician has requested you wear your ZIO patch monitor____14___days.   This is a single patch monitor.  [employer] supplies one patch monitor per enrollment.  Additional stickers are not available.   Please do not apply patch if you will be having a Nuclear Stress Test, Echocardiogram, Cardiac CT, MRI, or Chest Xray during the time frame you would be wearing the monitor. The patch cannot be worn during these tests.  You cannot remove and re-apply the ZIO XT patch monitor.   Your ZIO patch monitor will be sent USPS Priority mail from [facility] directly to your home address. The monitor may also be mailed to a PO BOX if home delivery is not available.   It may take 3-5 days to receive your monitor after you have been enrolled.   Once you have received you monitor, please review enclosed instructions.  Your monitor has already been registered assigning a specific monitor serial # to  you.   Applying the monitor   Shave hair from upper left chest.   Hold abrader disc by orange tab.  Rub abrader in 40 strokes over left upper chest as indicated in your monitor instructions.   Clean area with 4 enclosed alcohol pads .  Use all pads to assure are is cleaned thoroughly.  Let dry.   Apply patch as indicated in monitor instructions.  Patch will be place under collarbone on left side of chest with arrow pointing upward.   Rub patch adhesive wings for 2 minutes.Remove white label marked "1".  Remove white label marked "2".  Rub patch adhesive wings for 2 additional minutes.   While looking in a mirror, press and release button in center of patch.  A small green light will flash 3-4 times .  This will be your only indicator the monitor has been turned on.     Do not shower for the first 24 hours.  You may shower after the first 24 hours.   Press button if you feel a symptom. You will hear a small click.  Record Date, Time and Symptom in the Patient Log Book.   When you are ready to remove patch, follow instructions on last 2 pages of Patient Log Book.  Stick patch monitor onto last page of Patient Log Book.   Place Patient Log Book in [place] box.  Use locking tab on box and tape  box closed securely.  The [employer] has [employer] on it.  Please place in mailbox as soon as possible.  Your physician should have your test results approximately 7 days after the monitor has been mailed back to [facility].   Call [facility] at [phone] if you have questions regarding your ZIO XT patch monitor.  Call them immediately if you see an orange light blinking on your monitor.   If your monitor falls off in less than 4 days contact our Monitor department at [phone].  If your monitor becomes loose or falls off after 4 days call [employer] at [phone] for suggestions on securing your monitor.    Call the [place] office  at [phone] if you have any questions, problems or concerns.

## 2019-07-27 LAB — CBC
Hematocrit: 42.9 % (ref 37.5–51.0)
Hemoglobin: 14.8 g/dL (ref 13.0–17.7)
MCH: 35 pg — ABNORMAL HIGH (ref 26.6–33.0)
MCHC: 34.5 g/dL (ref 31.5–35.7)
MCV: 101 fL — ABNORMAL HIGH (ref 79–97)
Platelets: 221 10*3/uL (ref 150–450)
RBC: 4.23 x10E6/uL (ref 4.14–5.80)
RDW: 12.8 % (ref 11.6–15.4)
WBC: 5.9 10*3/uL (ref 3.4–10.8)

## 2019-07-27 LAB — LIPID PANEL
Chol/HDL Ratio: 3.8 ratio (ref 0.0–5.0)
Cholesterol, Total: 164 mg/dL (ref 100–199)
HDL: 43 mg/dL (ref >39)
LDL Chol Calc (NIH): 93 mg/dL (ref 0–99)
Triglycerides: 163 mg/dL — ABNORMAL HIGH (ref 0–149)
VLDL Cholesterol Cal: 28 mg/dL (ref 5–40)

## 2019-07-27 LAB — BASIC METABOLIC PANEL
BUN/Creatinine Ratio: 24 (ref 10–24)
BUN: 26 mg/dL (ref 8–27)
CO2: 22 mmol/L (ref 20–29)
Calcium: 9.1 mg/dL (ref 8.6–10.2)
Chloride: 102 mmol/L (ref 96–106)
Creatinine, Ser: 1.07 mg/dL (ref 0.76–1.27)
GFR calc Af Amer: 79 mL/min/{1.73_m2} (ref >59)
GFR calc non Af Amer: 68 mL/min/{1.73_m2} (ref >59)
Glucose: 95 mg/dL (ref 65–99)
Potassium: 4.6 mmol/L (ref 3.5–5.2)
Sodium: 136 mmol/L (ref 134–144)

## 2019-07-27 LAB — HEPATIC FUNCTION PANEL
ALT: 14 IU/L (ref 0–44)
AST: 18 IU/L (ref 0–40)
Albumin: 4.6 g/dL (ref 3.7–4.7)
Alkaline Phosphatase: 90 IU/L (ref 39–117)
Bilirubin Total: 0.4 mg/dL (ref 0.0–1.2)
Bilirubin, Direct: 0.15 mg/dL (ref 0.00–0.40)
Total Protein: 6.7 g/dL (ref 6.0–8.5)

## 2019-07-27 LAB — TSH: TSH: 1.55 u[IU]/mL (ref 0.450–4.500)

## 2019-08-04 DIAGNOSIS — I4891 Unspecified atrial fibrillation: Secondary | ICD-10-CM | POA: Diagnosis not present

## 2019-08-04 DIAGNOSIS — I10 Essential (primary) hypertension: Secondary | ICD-10-CM | POA: Diagnosis not present

## 2019-08-04 DIAGNOSIS — J4520 Mild intermittent asthma, uncomplicated: Secondary | ICD-10-CM | POA: Diagnosis not present

## 2019-08-04 DIAGNOSIS — E785 Hyperlipidemia, unspecified: Secondary | ICD-10-CM | POA: Diagnosis not present

## 2019-08-04 DIAGNOSIS — F329 Major depressive disorder, single episode, unspecified: Secondary | ICD-10-CM | POA: Diagnosis not present

## 2019-08-04 DIAGNOSIS — C61 Malignant neoplasm of prostate: Secondary | ICD-10-CM | POA: Diagnosis not present

## 2019-08-09 ENCOUNTER — Ambulatory Visit (INDEPENDENT_AMBULATORY_CARE_PROVIDER_SITE_OTHER): Payer: PPO | Admitting: *Deleted

## 2019-08-09 ENCOUNTER — Other Ambulatory Visit: Payer: Self-pay

## 2019-08-09 DIAGNOSIS — I480 Paroxysmal atrial fibrillation: Secondary | ICD-10-CM | POA: Diagnosis not present

## 2019-08-09 DIAGNOSIS — Z5181 Encounter for therapeutic drug level monitoring: Secondary | ICD-10-CM

## 2019-08-09 LAB — POCT INR: INR: 1.9 — AB (ref 2.0–3.0)

## 2019-08-09 NOTE — Patient Instructions (Signed)
Description   Take 3 tablets today and then continue on same dosage 2 tablets daily except 2.5 tablets on Mondays, Wednesdays and Fridays. Recheck in 5 weeks. Call if placed on any new medications [phone].

## 2019-08-24 DIAGNOSIS — R194 Change in bowel habit: Secondary | ICD-10-CM | POA: Diagnosis not present

## 2019-08-24 DIAGNOSIS — Z86010 Personal history of colonic polyps: Secondary | ICD-10-CM | POA: Diagnosis not present

## 2019-08-24 DIAGNOSIS — R140 Abdominal distension (gaseous): Secondary | ICD-10-CM | POA: Diagnosis not present

## 2019-08-24 DIAGNOSIS — Z800 Family history of malignant neoplasm of digestive organs: Secondary | ICD-10-CM | POA: Diagnosis not present

## 2019-08-26 DIAGNOSIS — I480 Paroxysmal atrial fibrillation: Secondary | ICD-10-CM | POA: Diagnosis not present

## 2019-08-30 DIAGNOSIS — I10 Essential (primary) hypertension: Secondary | ICD-10-CM | POA: Diagnosis not present

## 2019-08-30 DIAGNOSIS — J0110 Acute frontal sinusitis, unspecified: Secondary | ICD-10-CM | POA: Diagnosis not present

## 2019-09-13 ENCOUNTER — Other Ambulatory Visit: Payer: Self-pay

## 2019-09-13 ENCOUNTER — Ambulatory Visit: Payer: PPO | Admitting: Orthopedic Surgery

## 2019-09-13 ENCOUNTER — Ambulatory Visit (INDEPENDENT_AMBULATORY_CARE_PROVIDER_SITE_OTHER): Payer: PPO

## 2019-09-13 DIAGNOSIS — I480 Paroxysmal atrial fibrillation: Secondary | ICD-10-CM | POA: Diagnosis not present

## 2019-09-13 DIAGNOSIS — Z5181 Encounter for therapeutic drug level monitoring: Secondary | ICD-10-CM | POA: Diagnosis not present

## 2019-09-13 DIAGNOSIS — M84451A Pathological fracture, right femur, initial encounter for fracture: Secondary | ICD-10-CM

## 2019-09-13 DIAGNOSIS — M84453A Pathological fracture, unspecified femur, initial encounter for fracture: Secondary | ICD-10-CM

## 2019-09-13 DIAGNOSIS — Z7901 Long term (current) use of anticoagulants: Secondary | ICD-10-CM

## 2019-09-13 LAB — POCT INR: INR: 1.9 — AB (ref 2.0–3.0)

## 2019-09-13 NOTE — Patient Instructions (Signed)
Description   Take 3 tablets today, then start taking 2.5 tablets daily except 2 tablets on Tuesdays, Thursdays and Saturdays. Recheck in 4 weeks. Call if placed on any new medications [phone].

## 2019-09-14 DIAGNOSIS — I4891 Unspecified atrial fibrillation: Secondary | ICD-10-CM | POA: Diagnosis not present

## 2019-09-14 DIAGNOSIS — F322 Major depressive disorder, single episode, severe without psychotic features: Secondary | ICD-10-CM | POA: Diagnosis not present

## 2019-09-14 DIAGNOSIS — J4520 Mild intermittent asthma, uncomplicated: Secondary | ICD-10-CM | POA: Diagnosis not present

## 2019-09-14 DIAGNOSIS — I10 Essential (primary) hypertension: Secondary | ICD-10-CM | POA: Diagnosis not present

## 2019-09-14 DIAGNOSIS — E785 Hyperlipidemia, unspecified: Secondary | ICD-10-CM | POA: Diagnosis not present

## 2019-09-14 DIAGNOSIS — F329 Major depressive disorder, single episode, unspecified: Secondary | ICD-10-CM | POA: Diagnosis not present

## 2019-09-14 DIAGNOSIS — C61 Malignant neoplasm of prostate: Secondary | ICD-10-CM | POA: Diagnosis not present

## 2019-09-16 ENCOUNTER — Encounter: Payer: Self-pay | Admitting: Orthopedic Surgery

## 2019-09-16 NOTE — Progress Notes (Signed)
Office Visit Note   Patient: Jonathan Vargas           Date of Birth: [birth year]           MRN: [id number] Visit Date: 09/13/2019 Requested by: Jonathan Cha, MD [address] [place],  [place] [address] PCP: Jonathan Cha, MD  Subjective: Chief Complaint  Patient presents with  . Right Knee - Pain    HPI: Jonathan Vargas is a 74 year old patient here to follow-up right knee insufficiency stress reaction.  In general he does feel better.  He is more active.  He is doing more.  He is walking on the treadmill about 20 minutes a day for the last month.  He was last seen about 2 months ago.  Does have a history of stress fracture on the left-hand side.  He lives on a 1 level house.  He is also doing the bike.  Also is taking vitamin D and calcium.              ROS: All systems reviewed are negative as they relate to the chief complaint within the history of present illness.  Patient denies  fevers or chills.   Assessment & Plan: Visit Diagnoses:  1. 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  Plan isEssentially be careful how he exercises in the future.  Follow-up with [place] as needed.  No indication for operative treatment at this time.   Follow-Up Instructions: Return if symptoms worsen or fail to improve.   Orders:  No orders of the defined types were placed in this encounter.  No orders of the defined types were placed in this encounter.     Procedures: No procedures performed   Clinical Data: No additional findings.  Objective: Vital Signs: There were no vitals taken for this visit.  Physical Exam:   Constitutional: Patient appears well-developed HEENT:  Head: Normocephalic Eyes:EOM are normal Neck: Normal range of motion Cardiovascular: Normal rate Pulmonary/chest: Effort normal Neurologic:  Patient is alert Skin: Skin is warm Psychiatric: Patient has normal mood and affect   Ortho Exam: Ortho exam demonstrates pretty normal gait alignment.  No effusion in the right knee.  Extensor mechanism is intact.  No focal distal femoral or tibial plateau tenderness.  Collateral ligaments are stable.  No other masses lymphadenopathy or skin changes noted in that right knee region.  Range of motion is full.  Specialty Comments:  No specialty comments available.  Imaging: No results found.   PMFS History: Patient Active Problem List   Diagnosis Date Noted  . Long term (current) use of anticoagulants 09/13/2019  . It band syndrome, left 01/09/2018  . Malignant neoplasm of prostate ([place]) 10/22/2017  . Dupuytren's contracture of left hand 10/09/2017  . Chronic pain of left knee 09/30/2017  . Ear pain, left 05/17/2017  . Personal history of colonic polyps 09/26/2016  . Family history of colon cancer 09/26/2016  . Chronic frontal sinusitis 12/05/2015  . Chronic sinusitis 11/03/2015  . Obesity 03/29/2015  . Tobacco use 03/29/2015  . 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 Depressive disorder, not elsewhere classified 12/13/2012  . Obstructive sleep apnea 12/13/2012  . Esophageal reflux 12/13/2012  . Impaired fasting glucose 12/13/2012  . Tobacco use disorder 12/13/2012  . 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 Gout, unspecified 12/13/2012  . Long term current  use of anticoagulant therapy 12/13/2012  . Atrial fibrillation ([place]) 01/30/2011  . Bradycardia 01/30/2011  . Sleep apnea 01/30/2011  . Atrial flutter ([place]) 01/25/2011  . Essential hypertension, benign 01/25/2011  . Pure hypercholesterolemia 01/25/2011   Past Medical History:  Diagnosis Date  . Acid reflux   . Arthritis   . ASD (atrial septal defect)   . Asthma with allergic rhinitis and status asthmaticus    NO RECENT FLARE UPS  . Atrial fib/flutter, transient   . Cancer ([place])    skin  . Chronic  anticoagulation   . Depression   . Dysrhythmia    ATRIAL FIB  . ENT complaint    Dr. Erik Obey  . Erectile dysfunction   . Fatty liver   . FHx: allergies   . Gout    , Possible, Right Knee x2 ; intermittent milder episodes of pain in hte first MTP - uric acid 7.7- no treatment so far.  . Hemochromatosis    DOES TWICE YEAR  . History of kidney stones    SMALL IN RIGHT KIDNEY  . Hypercholesteremia   . Hyperlipemia    PT DENIES  . Hypertension   . Hypogonadism male   . Impaired fasting glucose   . 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  . Pneumonia    " walking PNA > 40 years ago"  . Prostate cancer ([place]) [place] 2018  . Seasonal allergies   . Sleep apnea    wears CPAP ( set at 4-11)  . Trigger finger, left    , fourth finger    Family History  Problem Relation Age of Onset  . Stroke Father   . Diabetes type II Father   . Colon cancer Father   . Heart disease Mother        with pacemaker  . Breast cancer Sister   . Diabetes type II Maternal Grandfather   . Leukemia Paternal Uncle   . Prostate cancer Other   . Pancreatic cancer Neg Hx     Past Surgical History:  Procedure Laterality Date  . CARDIOVERSION N/A 05/29/2016   Procedure: CARDIOVERSION;  Surgeon: Jonathan Latch, MD;  Location: [place];  Service: Cardiovascular;  Laterality: N/A;  . CATARACT EXTRACTION W/ INTRAOCULAR LENS  IMPLANT, BILATERAL  2016  . COLONOSCOPY WITH PROPOFOL N/A 09/26/2016   Procedure: COLONOSCOPY WITH PROPOFOL;  Surgeon: Jonathan Corner, MD;  Location: [place];  Service: Endoscopy;  Laterality: N/A;  . CYSTOSCOPY  01/23/2018   Procedure: CYSTOSCOPY FLEXIBLE;  Surgeon: Jonathan Gallo, MD;  [facility];  Service: Urology;;  no seeds found in bladder  . ESOPHAGOGASTRODUODENOSCOPY (EGD) WITH PROPOFOL N/A 09/26/2016   Procedure: ESOPHAGOGASTRODUODENOSCOPY (EGD) WITH PROPOFOL;  Surgeon:  Jonathan Corner, MD;  Location: [place];  Service: Endoscopy;  Laterality: N/A;  . LIVER BIOPSY  MANY YRS AGO  . NASAL SINUS SURGERY  1997  . RADIOACTIVE SEED IMPLANT N/A 01/23/2018   Procedure: RADIOACTIVE SEED IMPLANT/BRACHYTHERAPY IMPLANT;  Surgeon: Jonathan Gallo, MD;  Location: [facility];  Service: Urology;  Laterality: N/A;   69 seeds implanted  . SPACE OAR INSTILLATION N/A 01/23/2018   Procedure: SPACE OAR INSTILLATION;  Surgeon: Jonathan Gallo, MD;  Location: [facility];  Service: Urology;  Laterality: N/A;   Social History   Occupational History  . Occupation: retired  Tobacco Use  . Smoking status: Current Every Day Smoker    Packs/day: 0.25    Years:  50.00    Pack years: 12.50    Types: Cigarettes    Start date: 10/30/2013  . Smokeless tobacco: Former Systems developer    Types: Chew  . Tobacco comment: trying to quit 5 TO 6 CIGS PER DAY NOW  Vaping Use  . Vaping Use: Former  Substance and Sexual Activity  . Alcohol use: Yes    Alcohol/week: 0.0 standard drinks    Comment: 2- glasses of red wine per night  . Drug use: No  . Sexual activity: Not Currently

## 2019-10-11 ENCOUNTER — Ambulatory Visit (INDEPENDENT_AMBULATORY_CARE_PROVIDER_SITE_OTHER): Payer: PPO | Admitting: Pharmacist

## 2019-10-11 ENCOUNTER — Other Ambulatory Visit: Payer: Self-pay

## 2019-10-11 DIAGNOSIS — I480 Paroxysmal atrial fibrillation: Secondary | ICD-10-CM | POA: Diagnosis not present

## 2019-10-11 DIAGNOSIS — Z7901 Long term (current) use of anticoagulants: Secondary | ICD-10-CM | POA: Diagnosis not present

## 2019-10-11 LAB — POCT INR: INR: 2 (ref 2.0–3.0)

## 2019-10-11 NOTE — Patient Instructions (Signed)
Continue taking 2.5 tablets daily except 2 tablets on Tuesdays, Thursdays and Saturdays. Recheck in 4 weeks. Call if placed on any new medications [phone].

## 2019-10-20 ENCOUNTER — Ambulatory Visit: Payer: PPO | Admitting: Family Medicine

## 2019-10-20 DIAGNOSIS — Z8546 Personal history of malignant neoplasm of prostate: Secondary | ICD-10-CM | POA: Diagnosis not present

## 2019-10-20 DIAGNOSIS — R300 Dysuria: Secondary | ICD-10-CM | POA: Diagnosis not present

## 2019-10-25 ENCOUNTER — Ambulatory Visit (INDEPENDENT_AMBULATORY_CARE_PROVIDER_SITE_OTHER): Payer: PPO

## 2019-10-25 ENCOUNTER — Encounter: Payer: Self-pay | Admitting: Family Medicine

## 2019-10-25 ENCOUNTER — Other Ambulatory Visit: Payer: Self-pay

## 2019-10-25 ENCOUNTER — Ambulatory Visit: Payer: PPO | Admitting: Family Medicine

## 2019-10-25 DIAGNOSIS — G8929 Other chronic pain: Secondary | ICD-10-CM

## 2019-10-25 DIAGNOSIS — M25511 Pain in right shoulder: Secondary | ICD-10-CM

## 2019-10-25 NOTE — Progress Notes (Signed)
I saw and examined the patient with Dr. Elouise Munroe and agree with assessment and plan as outlined.    Chronic intermittent right shoulder pain.  X-Rays show AC and GH DJD.  Rotator cuff strength intact.    Has history of gout, but never has affected shoulder to his knowledge.    Will try colchicine for a few days.  If no improvement, then MRI of the shoulder.

## 2019-10-25 NOTE — Progress Notes (Signed)
Office Visit Note   Patient: Jonathan Vargas           Date of Birth: 04-24-1945           MRN: [id number] Visit Date: 10/25/2019 Requested by: Leeroy Cha, MD [address] [place],  [place] [address] PCP: Leeroy Cha, MD  Subjective: Chief Complaint  Patient presents with  . Right Shoulder - Pain    Intermittent pain in the shoulder x years. Approximately, 6 years ago, fell forward down stairs & hit rt shoulder on a vase. Does not recall having issue with shoulder right after. Pain can be an ache, can be sharp and can move around the shoulder.    HPI: 74 year old male presenting to clinic today with concerns of chronic right shoulder pain.  Patient states pain started several years ago, and has been off and on over the course of that time.  He says he has some days where he is completely pain-free, and other days.  The pain bothers him significantly throughout the day.  He cannot think of any activities that necessarily provoke his pain, but does admit that occasionally when working out with a physical trainer he will notice that his shoulder pain prevents him from doing some of the weightlifting exercises.  Pain is most troubling when he is lying in bed at night, and he describes the sensation as "feeling like a toothache."  He says that previously, he has always been a side sleeper, however this pain in his shoulder as well as other musculoskeletal concerns which have been ongoing, have made him a back sleeper.  Despite sleeping on his back, he states that he will wake up with a throbbing pain in his right shoulder.  He states that several years ago he fell down a flight of stairs, however this was not necessarily related to the onset of his right shoulder pain, but it is the only trauma that he can think of that may have caused his symptoms.  He denies any weakness or tingling in the affected arm, and states that there are some days where his arm does not  bother him at all.  He says that, primarily, he would like an MRI to make sure that there is no intrinsic damage to his shoulder he is going to worsen with his continued weight lifting in the gym.              ROS:   All other systems were reviewed and are negative.  Objective: Vital Signs: There were no vitals taken for this visit.  Physical Exam:  General:  Alert and oriented, in no acute distress. Pulm:  Breathing unlabored. Psy:  Normal mood, congruent affect. Skin:  No rashes, no bruising.   RIGHT SHOULDER:  Full ROM without pain. Mild crepitus appreciated within shoulder joint with shoulder compression.  No pain with compression of AC joint.  No pain with palpation of bicipital tendon.  No pain with palpation of subacromial area. Pain is not worsened with Luan Pulling, or Neer's testing. Full strength without pain with empty can testing, no pain with resisted internal or external rotation of the shoulder.   Imaging: XR Shoulder Right  Result Date: 10/25/2019 X-Rays show moderate glenohumeral joint space narrowing.  There is AC joint DJD with spurring.  No soft tissue calcifications.   Assessment & Plan: 74 year old male presenting to clinic today for chronic right shoulder pain.  Examination as above, which is reassuring against rotator cuff tears.  It is reassuring that patient's symptoms are "on and off," with some days being completely symptom-free.  He does have a history of gout, so this may be a cause of his symptoms.  Additionally, x-rays obtained today do demonstrate degenerative changes within the glenohumeral joint as well as the [facility] joint.  We discussed taking colchicine to see if this improves some of his discomfort.  We also discussed taking Voltaren gel, which patient is agreeable to. -If no improvement with NSAID therapy, patient is to return to clinic for reevaluation and consideration of MRI. -Patient is agreeable with assessment and plan, and no further questions or  concerns.     Procedures: No procedures performed  No notes on file     PMFS History: Patient Active Problem List   Diagnosis Date Noted  . Long term (current) use of anticoagulants 09/13/2019  . It band syndrome, left 01/09/2018  . Malignant neoplasm of prostate ([place]) 10/22/2017  . Dupuytren's contracture of left hand 10/09/2017  . Chronic pain of left knee 09/30/2017  . Ear pain, left 05/17/2017  . Personal history of colonic polyps 09/26/2016  . Family history of colon cancer 09/26/2016  . Chronic frontal sinusitis 12/05/2015  . Chronic sinusitis 11/03/2015  . Obesity 03/29/2015  . Tobacco use 03/29/2015  . 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 Depressive disorder, not elsewhere classified 12/13/2012  . Obstructive sleep apnea 12/13/2012  . Esophageal reflux 12/13/2012  . Impaired fasting glucose 12/13/2012  . Tobacco use disorder 12/13/2012  . 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 Gout, unspecified 12/13/2012  . Long term current use of anticoagulant therapy 12/13/2012  . Atrial fibrillation ([place]) 01/30/2011  . Bradycardia 01/30/2011  . Sleep apnea 01/30/2011  . Atrial flutter ([employer]) 01/25/2011  . Essential hypertension, benign 01/25/2011  . Pure hypercholesterolemia 01/25/2011   Past Medical History:  Diagnosis Date  . Acid reflux   . Arthritis   . ASD (atrial septal defect)   . Asthma with allergic rhinitis and status asthmaticus    NO RECENT FLARE UPS  . Atrial fib/flutter, transient   . Cancer ([place])    skin  . Chronic anticoagulation   . Depression   . Dysrhythmia    ATRIAL FIB  . ENT complaint    Dr. Erik Obey  . Erectile dysfunction   . Fatty liver   . FHx: allergies   . Gout    , Possible, Right Knee x2 ; intermittent milder episodes of pain in hte first MTP - uric acid 7.7- no treatment so far.  . Hemochromatosis    DOES TWICE YEAR  . History of kidney stones    SMALL IN RIGHT KIDNEY  . Hypercholesteremia   . Hyperlipemia     PT DENIES  . Hypertension   . Hypogonadism male   . Impaired fasting glucose   . 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  . Pneumonia    " walking PNA > 40 years ago"  . Prostate cancer ([place]) [place] 2018  . Seasonal allergies   . Sleep apnea    wears CPAP ( set at 4-11)  . Trigger finger, left    , fourth finger    Family History  Problem Relation Age of Onset  . Stroke Father   . Diabetes type II Father   . Colon cancer Father   . Heart disease Mother        with pacemaker  . Breast cancer Sister   .  Diabetes type II Maternal Grandfather   . Leukemia Paternal Uncle   . Prostate cancer Other   . Pancreatic cancer Neg Hx     Past Surgical History:  Procedure Laterality Date  . CARDIOVERSION N/A 05/29/2016   Procedure: CARDIOVERSION;  Surgeon: [name], MD;  Location: [place];  Service: Cardiovascular;  Laterality: N/A;  . CATARACT EXTRACTION W/ INTRAOCULAR LENS  IMPLANT, BILATERAL  2016  . COLONOSCOPY WITH PROPOFOL N/A 09/26/2016   Procedure: COLONOSCOPY WITH PROPOFOL;  Surgeon: Wilford Corner, MD;  Location: [place];  Service: Endoscopy;  Laterality: N/A;  . CYSTOSCOPY  01/23/2018   Procedure: CYSTOSCOPY FLEXIBLE;  Surgeon: Franchot Gallo, MD;  Location[facility];  Service: Urology;;  no seeds found in bladder  . ESOPHAGOGASTRODUODENOSCOPY (EGD) WITH PROPOFOL N/A 09/26/2016   Procedure: ESOPHAGOGASTRODUODENOSCOPY (EGD) WITH PROPOFOL;  Surgeon: Wilford Corner, MD;  Location: [place];  Service: Endoscopy;  Laterality: N/A;  . LIVER BIOPSY  MANY YRS AGO  . NASAL SINUS SURGERY  1997  . RADIOACTIVE SEED IMPLANT N/A 01/23/2018   Procedure: RADIOACTIVE SEED IMPLANT/BRACHYTHERAPY IMPLANT;  Surgeon: Franchot Gallo, MD;  Location[facility];  Service: Urology;  Laterality: N/A;   69 seeds implanted  . SPACE OAR INSTILLATION N/A 01/23/2018   Procedure:  SPACE OAR INSTILLATION;  Surgeon: Franchot Gallo, MD;  Location: [facility];  Service: Urology;  Laterality: N/A;   Social History   Occupational History  . Occupation: retired  Tobacco Use  . Smoking status: Current Every Day Smoker    Packs/day: 0.25    Years: 50.00    Pack years: 12.50    Types: Cigarettes    Start date: 10/30/2013  . Smokeless tobacco: Former Systems developer    Types: Chew  . Tobacco comment: trying to quit 5 TO 6 CIGS PER DAY NOW  Vaping Use  . Vaping Use: Former  Substance and Sexual Activity  . Alcohol use: Yes    Alcohol/week: 0.0 standard drinks    Comment: 2- glasses of red wine per night  . Drug use: No  . Sexual activity: Not Currently

## 2019-11-01 DIAGNOSIS — Z800 Family history of malignant neoplasm of digestive organs: Secondary | ICD-10-CM | POA: Diagnosis not present

## 2019-11-01 DIAGNOSIS — R194 Change in bowel habit: Secondary | ICD-10-CM | POA: Diagnosis not present

## 2019-11-01 DIAGNOSIS — R140 Abdominal distension (gaseous): Secondary | ICD-10-CM | POA: Diagnosis not present

## 2019-11-01 DIAGNOSIS — Z86010 Personal history of colonic polyps: Secondary | ICD-10-CM | POA: Diagnosis not present

## 2019-11-03 ENCOUNTER — Encounter: Payer: Self-pay | Admitting: Family Medicine

## 2019-11-03 DIAGNOSIS — G8929 Other chronic pain: Secondary | ICD-10-CM

## 2019-11-03 DIAGNOSIS — M25511 Pain in right shoulder: Secondary | ICD-10-CM

## 2019-11-04 ENCOUNTER — Ambulatory Visit
Admission: RE | Admit: 2019-11-04 | Discharge: 2019-11-04 | Disposition: A | Discharge status: Home / Self Care | Payer: PPO | Source: Ambulatory Visit | Attending: Family Medicine | Admitting: Family Medicine

## 2019-11-04 ENCOUNTER — Other Ambulatory Visit: Payer: Self-pay

## 2019-11-04 DIAGNOSIS — G8929 Other chronic pain: Secondary | ICD-10-CM | POA: Diagnosis not present

## 2019-11-04 DIAGNOSIS — M7551 Bursitis of right shoulder: Secondary | ICD-10-CM | POA: Diagnosis not present

## 2019-11-04 DIAGNOSIS — M75111 Incomplete rotator cuff tear or rupture of right shoulder, not specified as traumatic: Secondary | ICD-10-CM | POA: Diagnosis not present

## 2019-11-04 DIAGNOSIS — M19011 Primary osteoarthritis, right shoulder: Secondary | ICD-10-CM | POA: Diagnosis not present

## 2019-11-05 ENCOUNTER — Telehealth: Payer: Self-pay | Admitting: Family Medicine

## 2019-11-05 DIAGNOSIS — G8929 Other chronic pain: Secondary | ICD-10-CM

## 2019-11-05 DIAGNOSIS — M67911 Unspecified disorder of synovium and tendon, right shoulder: Secondary | ICD-10-CM

## 2019-11-05 NOTE — Telephone Encounter (Signed)
MRI shows a partial rotator cuff tear along with arthritis and some tendinopathy and bursitis.

## 2019-11-05 NOTE — Addendum Note (Signed)
Addended by: Hortencia Pilar on: 11/05/2019 02:27 PM   Modules accepted: Orders

## 2019-11-08 ENCOUNTER — Other Ambulatory Visit: Payer: Self-pay

## 2019-11-08 ENCOUNTER — Ambulatory Visit (INDEPENDENT_AMBULATORY_CARE_PROVIDER_SITE_OTHER): Payer: PPO | Admitting: *Deleted

## 2019-11-08 DIAGNOSIS — I480 Paroxysmal atrial fibrillation: Secondary | ICD-10-CM | POA: Diagnosis not present

## 2019-11-08 DIAGNOSIS — Z5181 Encounter for therapeutic drug level monitoring: Secondary | ICD-10-CM

## 2019-11-08 LAB — POCT INR: INR: 1.8 — AB (ref 2.0–3.0)

## 2019-11-08 NOTE — Patient Instructions (Signed)
Description   Take 3 tablets today and then start taking 2.5 tablets daily except 2 tablets on Saturdays. Be consistent with your greens.  Recheck in 3 weeks. Call if placed on any new medications [phone].

## 2019-11-11 DIAGNOSIS — M75111 Incomplete rotator cuff tear or rupture of right shoulder, not specified as traumatic: Secondary | ICD-10-CM | POA: Diagnosis not present

## 2019-11-11 DIAGNOSIS — M25511 Pain in right shoulder: Secondary | ICD-10-CM | POA: Diagnosis not present

## 2019-11-11 DIAGNOSIS — L570 Actinic keratosis: Secondary | ICD-10-CM | POA: Diagnosis not present

## 2019-11-11 DIAGNOSIS — Z85828 Personal history of other malignant neoplasm of skin: Secondary | ICD-10-CM | POA: Diagnosis not present

## 2019-11-11 DIAGNOSIS — L821 Other seborrheic keratosis: Secondary | ICD-10-CM | POA: Diagnosis not present

## 2019-11-11 DIAGNOSIS — M19011 Primary osteoarthritis, right shoulder: Secondary | ICD-10-CM | POA: Diagnosis not present

## 2019-11-11 DIAGNOSIS — B356 Tinea cruris: Secondary | ICD-10-CM | POA: Diagnosis not present

## 2019-11-15 DIAGNOSIS — C61 Malignant neoplasm of prostate: Secondary | ICD-10-CM | POA: Diagnosis not present

## 2019-11-15 DIAGNOSIS — J4520 Mild intermittent asthma, uncomplicated: Secondary | ICD-10-CM | POA: Diagnosis not present

## 2019-11-15 DIAGNOSIS — F329 Major depressive disorder, single episode, unspecified: Secondary | ICD-10-CM | POA: Diagnosis not present

## 2019-11-15 DIAGNOSIS — F322 Major depressive disorder, single episode, severe without psychotic features: Secondary | ICD-10-CM | POA: Diagnosis not present

## 2019-11-15 DIAGNOSIS — I10 Essential (primary) hypertension: Secondary | ICD-10-CM | POA: Diagnosis not present

## 2019-11-15 DIAGNOSIS — I4891 Unspecified atrial fibrillation: Secondary | ICD-10-CM | POA: Diagnosis not present

## 2019-11-15 DIAGNOSIS — E785 Hyperlipidemia, unspecified: Secondary | ICD-10-CM | POA: Diagnosis not present

## 2019-11-15 NOTE — Progress Notes (Signed)
CARDIOLOGY OFFICE NOTE  Date:  11/29/2019    Jonathan Vargas Date of Birth: Mar 21, 1945 Medical Record #[id number]  PCP:  Leeroy Cha, MD  Cardiologist:  Servando Snare & Skains/Allred  Chief Complaint  Patient presents with  . Follow-up    History of Present Illness: Jonathan Vargas is a 74 y.o. adult who presents today for a 4 month check. Seen for Dr. Marlou Porch &Allred.  [name]".  He has a history ofatrial fibrillation/flutter paroxysmal on antiarrhythmic and is onchronicwarfarin for his anticoagulation. Had stress test 09/01/12 that demonstrated a mild inferior wall defect, possible diaphragmatic attenuation, overall low risk study with normal ejection fraction. Other issues include HTN, prior carotid artery dissection in 1997, ongoing tobacco abuse, hemachromatosis. Previous hospitalization in November 2012 demonstrated paroxysmal atrial flutter which converted after 4 hours of diltiazem. He had a postconversion pause of 8.2 seconds, felt flushed. Endorses chronic palpitations.   I saw him as a work in back in [place]- out of rhythm. Somewhat symptomatic. Got him cardioverted and back to see [name] for discussion of other options - but opted for his continued regimen. ARB was restarted for elevated BP. Patient elected to continue CCB therapy despite bradycardia.Hehascontinued on his anticoagulation with coumadin.   Dr. Marlou Porch and I have followed him since. He has had intermittent issues with his BP. He has ongoing use of alcohol. He has had to have CCB eventually stopped due to presyncope.There was concern for post conversion pauses which has been documented in the past.  He has actually made good progress over the past couple of years with losing weight - after going to diabetic classes. Has had some atypical chest pain - has needed clearance for GU surgery and we have updated his stress test (October 2019) which was reassuring. Still  smoking a few cigarettes and alcohol use continues. Cardiac status has been ok. Last seen by me back [facility]. Had had some spells of lightheadedness - concern HR too low. Smoking 5 cigs and having 2 glasses of wine. I had him wear a 14 day monitor - average HR was 55.   Comes in today. Here alone. Remains bradycardic. He wonders if he is getting a sinus infection - dragging him down - thinks this makes him tired - has to force himself more to go to the gym. He notes this is a pattern for him with his sinus. He is also bradycardic. He is napping more now - now about every day. Waking up tired. He wears his CPAP religiously. Weight is up some. Typically feel better after he goes to the gym. This fatigue can last for several hours. Seems to wax and wane. Also with some intermittent ear pain - which is why he attributes to his sinuses. No syncope. No chest pain. Not short of breath. Continues to smoke - down to 4 cigs/day and have his 2 glasses of wine nightly.   Past Medical History:  Diagnosis Date  . Acid reflux   . Arthritis   . ASD (atrial septal defect)   . Asthma with allergic rhinitis and status asthmaticus    NO RECENT FLARE UPS  . Atrial fib/flutter, transient   . Cancer ([place])    skin  . Chronic anticoagulation   . Depression   . Dysrhythmia    ATRIAL FIB  . ENT complaint    Dr. Erik Obey  . Erectile dysfunction   .  Fatty liver   . FHx: allergies   . Gout    , Possible, Right Knee x2 ; intermittent milder episodes of pain in hte first MTP - uric acid 7.7- no treatment so far.  . Hemochromatosis    DOES TWICE YEAR  . History of kidney stones    SMALL IN RIGHT KIDNEY  . Hypercholesteremia   . Hyperlipemia    PT DENIES  . Hypertension   . Hypogonadism male   . Impaired fasting glucose   . 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  . Pneumonia    " walking PNA > 40 years ago"    . Prostate cancer ([place]) [place] 2018  . Seasonal allergies   . Sleep apnea    wears CPAP ( set at 4-11)  . Trigger finger, left    , fourth finger    Past Surgical History:  Procedure Laterality Date  . CARDIOVERSION N/A 05/29/2016   Procedure: CARDIOVERSION;  Surgeon: [name], MD;  Location: [place];  Service: Cardiovascular;  Laterality: N/A;  . CATARACT EXTRACTION W/ INTRAOCULAR LENS  IMPLANT, BILATERAL  2016  . COLONOSCOPY WITH PROPOFOL N/A 09/26/2016   Procedure: COLONOSCOPY WITH PROPOFOL;  Surgeon: Wilford Corner, MD;  Location: [place];  Service: Endoscopy;  Laterality: N/A;  . CYSTOSCOPY  01/23/2018   Procedure: CYSTOSCOPY FLEXIBLE;  Surgeon: Franchot Gallo, MD;  Location: [facility];  Service: Urology;;  no seeds found in bladder  . ESOPHAGOGASTRODUODENOSCOPY (EGD) WITH PROPOFOL N/A 09/26/2016   Procedure: ESOPHAGOGASTRODUODENOSCOPY (EGD) WITH PROPOFOL;  Surgeon: Wilford Corner, MD;  Location: [place];  Service: Endoscopy;  Laterality: N/A;  . LIVER BIOPSY  MANY YRS AGO  . NASAL SINUS SURGERY  1997  . RADIOACTIVE SEED IMPLANT N/A 01/23/2018   Procedure: RADIOACTIVE SEED IMPLANT/BRACHYTHERAPY IMPLANT;  Surgeon: Franchot Gallo, MD;  Location[facility];  Service: Urology;  Laterality: N/A;   69 seeds implanted  . SPACE OAR INSTILLATION N/A 01/23/2018   Procedure: SPACE OAR INSTILLATION;  Surgeon: Franchot Gallo, MD;  Location: [facility];  Service: Urology;  Laterality: N/A;     Medications: Current Meds  Medication Sig  . albuterol (PROVENTIL HFA;VENTOLIN HFA) 108 (90 Base) MCG/ACT inhaler Inhale 2 puffs into the lungs every 6 (six) hours as needed for wheezing or shortness of breath.  . allopurinol (ZYLOPRIM) 300 MG tablet Take 300 mg by mouth every evening.   Marland Kitchen CALCIUM PO Take 200 mg by mouth in the morning, at noon, and at bedtime.   . Cholecalciferol (VITAMIN D3) 125 MCG (5000 UT) CAPS Take  1 capsule by mouth daily.  Marland Kitchen COLCRYS 0.6 MG tablet Take 0.6 mg by mouth 2 (two) times daily as needed (for gout).   Marland Kitchen diclofenac sodium (VOLTAREN) 1 % GEL Apply 1 application topically 4 (four) times daily as needed (for pain).   . fexofenadine (ALLEGRA) 60 MG tablet Take 60 mg by mouth daily as needed. For allergies  . flecainide (TAMBOCOR) 50 MG tablet TAKE 1 TABLET BY MOUTH 2 TIMES DAILY FOR THE HEART.  . fluticasone (FLONASE) 50 MCG/ACT nasal spray Place 1 spray into the nose every evening.   . irbesartan (AVAPRO) 150 MG tablet TAKE 1 TABLET BY MOUTH DAILY.  Marland Kitchen MAGNESIUM PO Take 4 capsules by mouth every evening.   . Menaquinone-7 (VITAMIN K2) 100 MCG CAPS Take by mouth daily.  . Misc Natural Products (LUTEIN 20) CAPS Take 40 mg by mouth  2 (two) times daily.   . NON FORMULARY CPAP  . TURMERIC PO Take 1 capsule by mouth 2 (two) times daily.   . vitamin B-12 (CYANOCOBALAMIN) 1000 MCG tablet Take 1,000 mcg by mouth daily.  Marland Kitchen warfarin (COUMADIN) 5 MG tablet TAKE 2 TO 2 AND 1/2 TABLETS BY MOUTH AS DIRECTED BT COUMADIN CLINIC     Allergies: Allergies  Allergen Reactions  . Clindamycin/Lincomycin Diarrhea and Other (See Comments)    Stomach pain   . Ciprofloxacin Other (See Comments)    Joint pain  . Amoxicillin Rash    Social History: The patient  reports that he has been smoking cigarettes. He started smoking about 6 years ago. He has a 12.50 pack-year smoking history. He has quit using smokeless tobacco.  His smokeless tobacco use included chew. He reports current alcohol use. He reports that he does not use drugs.   Family History: The patient's family history includes Breast cancer in his sister; Colon cancer in his father; Diabetes type II in his father and maternal grandfather; Heart disease in his mother; Leukemia in his paternal uncle; Prostate cancer in an other family member; Stroke in his father.   Review of Systems: Please see the history of present illness.   All other  systems are reviewed and negative.   Physical Exam: VS:  BP 116/64   Pulse (!) 45   Ht 6\' 2"  (1.88 m)   Wt 218 lb 3.2 oz (99 kg)   SpO2 93%   BMI 28.02 kg/m  .  BMI Body mass index is 28.02 kg/m.  Wt Readings from Last 3 Encounters:  11/29/19 218 lb 3.2 oz (99 kg)  07/26/19 211 lb 1.9 oz (95.8 kg)  05/28/19 209 lb (94.8 kg)    General: Pleasant. Alert and in no acute distress. His weight is up some.  Cardiac: Regular rate and rhythm. No murmurs, rubs, or gallops. No edema.  Respiratory:  Lungs are clear to auscultation bilaterally with normal work of breathing.  GI: Soft and nontender.  MS: No deformity or atrophy. Gait and ROM intact.  Skin: Warm and dry. Color is normal.  Neuro:  Strength and sensation are intact and no gross focal deficits noted.  Psych: Alert, appropriate and with normal affect.   LABORATORY DATA:  EKG:  EKG is ordered today.  Personally reviewed by me. This demonstrates sinus bradycardia - HR is 45 today. Tracing is unchanged except for slower HR of 51 noted on prior tracing.   Lab Results  Component Value Date   WBC 5.9 07/26/2019   HGB 14.8 07/26/2019   HCT 42.9 07/26/2019   PLT 221 07/26/2019   GLUCOSE 95 07/26/2019   CHOL 164 07/26/2019   TRIG 163 (H) 07/26/2019   HDL 43 07/26/2019   LDLCALC 93 07/26/2019   ALT 14 07/26/2019   AST 18 07/26/2019   NA 136 07/26/2019   K 4.6 07/26/2019   CL 102 07/26/2019   CREATININE 1.07 07/26/2019   BUN 26 07/26/2019   CO2 22 07/26/2019   TSH 1.550 07/26/2019   INR 1.8 (A) 11/08/2019     BNP (last 3 results) No results for input(s): BNP in the last 8760 hours.  ProBNP (last 3 results) No results for input(s): PROBNP in the last 8760 hours.   Other Studies Reviewed Today:  14 Day ZIO Study Highlights 07/2019    Sinus rhythm with average HR 55 bpm, minimum 32 bpm during sleep (benign), peak 132 bpm  No atrial fibrillation,  no pauses  Rare paroxysmal atrial tachycardia (7 beats duration)  - asymptomatic  Rare PVC's, PAC's - 2 episodes of chest discomfort in diary sinus rhythm with one isolated PVC - benign, rare bigeminy  Brief idioventricular rhythm during sleep.  No ventricular tachycardia   Overall reassuring monitor On low dose Flecainide Candee Furbish, MD  [employer] Surgical Highlights10/2019   Nuclear stress EF: 55%.  There was no ST segment deviation noted during stress.  The study is normal.  This is a low risk study.  The left ventricular ejection fraction is normal (55-65%).    Echo Study Conclusions 05/2016  - Left ventricle: The cavity size was normal. Wall thickness was increased in a pattern of mild LVH. Systolic function was normal. The estimated ejection fraction was in the range of 55% to 60%. Wall motion was normal; there were no regional wall motion abnormalities. Left ventricular diastolic function parameters were normal. - Left atrium: The atrium was moderately dilated. - Atrial septum: No defect or patent foramen ovale was identified.  Procedure: Electrical Cardioversion Indications:Atrial Flutter  Procedure Details Consent: Risks of procedure as well as the alternatives and risks of each were explained to the (patient/caregiver). Consent for procedure obtained. Time STM:[id number] patient identification, verified procedure, site/side was marked, verified correct patient position, special equipment/implants available, medications/allergies/relevent history reviewed, required imaging and test results available. Performed  Patient placed on cardiac monitor, pulse oximetry, supplemental oxygen as necessary.  Sedation given: Propofol 70 mg Pacer pads placed anterior and posterior chest. Cardioverted 1time(s).  Cardioverted at 120J. Evaluation Findings: Post procedure EKG shows: NSR Complications: None Patient didtolerate procedure well.  [name], MD 05/29/2016, 1:53 PM   ASSESSMENT & PLAN:   1.  Fatigue - may be from bradycardia - recent monitor was stable - no syncope. He feels like he is doing ok at this time. He feels this is more related to sinus issues. Will need to follow.   2. Persistent bradycardia - baseline HR lower today - no syncope noted. Some fatigue noted but he really thinks this is more sinus related - I am not convinced. Zio reviewed.   3. PAF - remains in sinus - on Flecainide.   4. Probable SSS - at some point will most likely need PPM going forward.   5. Chronic anticoagulation - INR today. No problems noted.   6. HTN - BP ok here today - no changes made.   7. OSA - on CPAP  8. Obesity - weight is back up some - he is aware.   9. Substance abuse - seems unchanged.   Current medicines are reviewed with the patient today.  The patient does not have concerns regarding medicines other than what has been noted above.  The following changes have been made:  See above.  Labs/ tests ordered today include:    Orders Placed This Encounter  Procedures  . EKG 12-Lead     Disposition:   FU with [place] in 3 months with EKG.      Patient is agreeable to this plan and will call if any problems develop in the interim.   SignedTruitt Merle, NP  11/29/2019 10:49 AM  [facility] [address] [place], [place]  [address] Phone: [phone] Fax: [phone]

## 2019-11-18 DIAGNOSIS — M25511 Pain in right shoulder: Secondary | ICD-10-CM | POA: Diagnosis not present

## 2019-11-18 DIAGNOSIS — M19011 Primary osteoarthritis, right shoulder: Secondary | ICD-10-CM | POA: Diagnosis not present

## 2019-11-18 DIAGNOSIS — M75111 Incomplete rotator cuff tear or rupture of right shoulder, not specified as traumatic: Secondary | ICD-10-CM | POA: Diagnosis not present

## 2019-11-29 ENCOUNTER — Other Ambulatory Visit: Payer: Self-pay

## 2019-11-29 ENCOUNTER — Ambulatory Visit: Payer: PPO | Admitting: Nurse Practitioner

## 2019-11-29 ENCOUNTER — Ambulatory Visit (INDEPENDENT_AMBULATORY_CARE_PROVIDER_SITE_OTHER): Payer: PPO

## 2019-11-29 ENCOUNTER — Encounter: Payer: Self-pay | Admitting: Nurse Practitioner

## 2019-11-29 VITALS — BP 116/64 | HR 45 | Ht 74.0 in | Wt 218.2 lb

## 2019-11-29 DIAGNOSIS — I480 Paroxysmal atrial fibrillation: Secondary | ICD-10-CM

## 2019-11-29 DIAGNOSIS — Z7901 Long term (current) use of anticoagulants: Secondary | ICD-10-CM

## 2019-11-29 DIAGNOSIS — R001 Bradycardia, unspecified: Secondary | ICD-10-CM | POA: Diagnosis not present

## 2019-11-29 LAB — POCT INR: INR: 2.6 (ref 2.0–3.0)

## 2019-11-29 NOTE — Patient Instructions (Signed)
Description   Continue on same dosage 2.5 tablets daily except 2 tablets on Saturdays. Be consistent with your greens.  Recheck in 4 weeks. Call if placed on any new medications [phone].

## 2019-11-29 NOTE — Patient Instructions (Addendum)
After Visit Summary:  We will be checking the following labs today - NONE   Medication Instructions:    Continue with your current medicines.    If you need a refill on your cardiac medications before your next appointment, please call your pharmacy.     Testing/Procedures To Be Arranged:  N/A  Follow-Up:   See me in 3 months with EKG    At [facility], you and your health needs are our priority.  As part of our continuing mission to provide you with exceptional heart care, we have created designated Provider Care Teams.  These Care Teams include your primary Cardiologist (physician) and Advanced Practice Providers (APPs -  Physician Assistants and Nurse Practitioners) who all work together to provide you with the care you need, when you need it.  Special Instructions:  . Stay safe, wash your hands for at least 20 seconds and wear a mask when needed.  . It was good to talk with you today.    Call the [place] office at [phone] if you have any questions, problems or concerns.

## 2019-12-01 ENCOUNTER — Other Ambulatory Visit (HOSPITAL_COMMUNITY): Payer: Self-pay | Admitting: *Deleted

## 2019-12-02 ENCOUNTER — Ambulatory Visit (HOSPITAL_COMMUNITY)
Admission: RE | Admit: 2019-12-02 | Discharge: 2019-12-02 | Disposition: A | Discharge status: Home / Self Care | Payer: PPO | Source: Ambulatory Visit | Attending: Internal Medicine | Admitting: Internal Medicine

## 2019-12-02 ENCOUNTER — Other Ambulatory Visit: Payer: Self-pay

## 2019-12-02 LAB — POCT HEMOGLOBIN-HEMACUE: Hemoglobin: 12.8 g/dL — ABNORMAL LOW (ref 13.0–17.0)

## 2019-12-02 MED ORDER — LIDOCAINE HCL 2 % IJ SOLN: 0.1000 mL | Freq: Once | INTRAMUSCULAR | Status: DC

## 2019-12-07 ENCOUNTER — Ambulatory Visit (INDEPENDENT_AMBULATORY_CARE_PROVIDER_SITE_OTHER): Payer: PPO | Admitting: Ophthalmology

## 2019-12-07 ENCOUNTER — Encounter (INDEPENDENT_AMBULATORY_CARE_PROVIDER_SITE_OTHER): Payer: Self-pay | Admitting: Ophthalmology

## 2019-12-07 ENCOUNTER — Other Ambulatory Visit: Payer: Self-pay

## 2019-12-07 DIAGNOSIS — H4423 Degenerative myopia, bilateral: Secondary | ICD-10-CM

## 2019-12-07 DIAGNOSIS — H43813 Vitreous degeneration, bilateral: Secondary | ICD-10-CM | POA: Diagnosis not present

## 2019-12-07 DIAGNOSIS — H353131 Nonexudative age-related macular degeneration, bilateral, early dry stage: Secondary | ICD-10-CM | POA: Diagnosis not present

## 2019-12-07 NOTE — Progress Notes (Signed)
12/07/2019     CHIEF COMPLAINT Patient presents for Retina Follow Up   HISTORY OF PRESENT ILLNESS: Jonathan Vargas is a 74 y.o. adult who presents to the clinic today for:   HPI    Retina Follow Up    In both eyes.  This started 1 year ago.  Severity is mild.  Duration of 1 year.  Since onset it is stable.          Comments    1 Year F/U OU  Pt reports floaters when trying to read the paper or computer screen OU off and on. VA overall stable OU.       Last edited by Rockie Neighbours, [place] on 12/07/2019  2:32 PM. (History)      Referring physician: Leeroy Cha, MD [address] [place],  [place] [address]  HISTORICAL INFORMATION:   Selected notes from the MEDICAL RECORD NUMBER       CURRENT MEDICATIONS: No current outpatient medications on file. (Ophthalmic Drugs)   No current facility-administered medications for this visit. (Ophthalmic Drugs)   Current Outpatient Medications (Other)  Medication Sig  . albuterol (PROVENTIL HFA;VENTOLIN HFA) 108 (90 Base) MCG/ACT inhaler Inhale 2 puffs into the lungs every 6 (six) hours as needed for wheezing or shortness of breath.  . allopurinol (ZYLOPRIM) 300 MG tablet Take 300 mg by mouth every evening.   Marland Kitchen CALCIUM PO Take 200 mg by mouth in the morning, at noon, and at bedtime.   . Cholecalciferol (VITAMIN D3) 125 MCG (5000 UT) CAPS Take 1 capsule by mouth daily.  Marland Kitchen COLCRYS 0.6 MG tablet Take 0.6 mg by mouth 2 (two) times daily as needed (for gout).   Marland Kitchen diclofenac sodium (VOLTAREN) 1 % GEL Apply 1 application topically 4 (four) times daily as needed (for pain).   . fexofenadine (ALLEGRA) 60 MG tablet Take 60 mg by mouth daily as needed. For allergies  . flecainide (TAMBOCOR) 50 MG tablet TAKE 1 TABLET BY MOUTH 2 TIMES DAILY FOR THE HEART.  . fluticasone (FLONASE) 50 MCG/ACT nasal spray Place 1 spray into the nose every evening.   . irbesartan (AVAPRO) 150 MG tablet TAKE 1 TABLET BY MOUTH DAILY.  Marland Kitchen MAGNESIUM  PO Take 4 capsules by mouth every evening.   . Menaquinone-7 (VITAMIN K2) 100 MCG CAPS Take by mouth daily.  . Misc Natural Products (LUTEIN 20) CAPS Take 40 mg by mouth 2 (two) times daily.   . NON FORMULARY CPAP  . TURMERIC PO Take 1 capsule by mouth 2 (two) times daily.   . vitamin B-12 (CYANOCOBALAMIN) 1000 MCG tablet Take 1,000 mcg by mouth daily.  Marland Kitchen warfarin (COUMADIN) 5 MG tablet TAKE 2 TO 2 AND 1/2 TABLETS BY MOUTH AS DIRECTED BT COUMADIN CLINIC   No current facility-administered medications for this visit. (Other)      REVIEW OF SYSTEMS:    ALLERGIES Allergies  Allergen Reactions  . Clindamycin/Lincomycin Diarrhea and Other (See Comments)    Stomach pain   . Ciprofloxacin Other (See Comments)    Joint pain  . Amoxicillin Rash    PAST MEDICAL HISTORY Past Medical History:  Diagnosis Date  . Acid reflux   . Arthritis   . ASD (atrial septal defect)   . Asthma with allergic rhinitis and status asthmaticus    NO RECENT FLARE UPS  . Atrial fib/flutter, transient   . Cancer ([place])    skin  . Chronic anticoagulation   . Depression   .  Dysrhythmia    ATRIAL FIB  . ENT complaint    Dr. Erik Obey  . Erectile dysfunction   . Fatty liver   . FHx: allergies   . Gout    , Possible, Right Knee x2 ; intermittent milder episodes of pain in hte first MTP - uric acid 7.7- no treatment so far.  . Hemochromatosis    DOES TWICE YEAR  . History of kidney stones    SMALL IN RIGHT KIDNEY  . Hypercholesteremia   . Hyperlipemia    PT DENIES  . Hypertension   . Hypogonadism male   . Impaired fasting glucose   . 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  . Pneumonia    " walking PNA > 40 years ago"  . Prostate cancer ([place]) [place] 2018  . Seasonal allergies   . Sleep apnea    wears CPAP ( set at 4-11)  . Trigger finger, left    , fourth finger   Past Surgical History:  Procedure Laterality Date  .  CARDIOVERSION N/A 05/29/2016   Procedure: CARDIOVERSION;  Surgeon: [name], MD;  Location: [facility];  Service: Cardiovascular;  Laterality: N/A;  . CATARACT EXTRACTION W/ INTRAOCULAR LENS  IMPLANT, BILATERAL  2016  . COLONOSCOPY WITH PROPOFOL N/A 09/26/2016   Procedure: COLONOSCOPY WITH PROPOFOL;  Surgeon: Wilford Corner, MD;  Location: [facility];  Service: Endoscopy;  Laterality: N/A;  . CYSTOSCOPY  01/23/2018   Procedure: CYSTOSCOPY FLEXIBLE;  Surgeon: Franchot Gallo, MD;  Location[facility];  Service: Urology;;  no seeds found in bladder  . ESOPHAGOGASTRODUODENOSCOPY (EGD) WITH PROPOFOL N/A 09/26/2016   Procedure: ESOPHAGOGASTRODUODENOSCOPY (EGD) WITH PROPOFOL;  Surgeon: Wilford Corner, MD;  Location: [facility];  Service: Endoscopy;  Laterality: N/A;  . LIVER BIOPSY  MANY YRS AGO  . NASAL SINUS SURGERY  1997  . RADIOACTIVE SEED IMPLANT N/A 01/23/2018   Procedure: RADIOACTIVE SEED IMPLANT/BRACHYTHERAPY IMPLANT;  Surgeon: Franchot Gallo, MD;  [facility];  Service: Urology;  Laterality: N/A;   69 seeds implanted  . SPACE OAR INSTILLATION N/A 01/23/2018   Procedure: SPACE OAR INSTILLATION;  Surgeon: Franchot Gallo, MD;  Location: [facility];  Service: Urology;  Laterality: N/A;    FAMILY HISTORY Family History  Problem Relation Age of Onset  . Stroke Father   . Diabetes type II Father   . Colon cancer Father   . Heart disease Mother        with pacemaker  . Breast cancer Sister   . Diabetes type II Maternal Grandfather   . Leukemia Paternal Uncle   . Prostate cancer Other   . Pancreatic cancer Neg Hx     SOCIAL HISTORY Social History   Tobacco Use  . Smoking status: Current Every Day Smoker    Packs/day: 0.25    Years: 50.00    Pack years: 12.50    Types: Cigarettes    Start date: 10/30/2013  . Smokeless tobacco: Former Systems developer    Types: Chew  . Tobacco comment: trying to quit 5 TO 6 CIGS  PER DAY NOW  Vaping Use  . Vaping Use: Former  Substance Use Topics  . Alcohol use: Yes    Alcohol/week: 0.0 standard drinks    Comment: 2- glasses of red wine per night  . Drug use: No         OPHTHALMIC EXAM: Base Eye Exam    Visual Acuity (ETDRS)  Right Left   Dist [place] 20/25 +2    Dist cc  20/20 -2   Correction: Contacts  Pt wears CL OS.       Tonometry (Tonopen, 2:33 PM)      Right Left   Pressure 18 19       Pupils      Pupils Dark Light Shape React APD   Right PERRL 4 3 Round Brisk None   Left PERRL 4 3 Round Brisk None       Visual Fields (Counting fingers)      Left Right    Full Full       Extraocular Movement      Right Left    Full Full       Neuro/Psych    Oriented x3: Yes   Mood/Affect: Normal       Dilation    Both eyes: 1.0% Mydriacyl, 2.5% Phenylephrine @ 2:38 PM        Slit Lamp and Fundus Exam    External Exam      Right Left   External Normal Normal       Slit Lamp Exam      Right Left   Lids/Lashes Normal Normal   Conjunctiva/Sclera White and quiet White and quiet   Cornea Clear Clear   Anterior Chamber Deep and quiet Deep and quiet   Iris Round and reactive Round and reactive   Lens Posterior chamber intraocular lens Posterior chamber intraocular lens   Anterior Vitreous Normal Normal       Fundus Exam      Right Left   Posterior Vitreous Posterior vitreous detachment Posterior vitreous detachment   Disc Peripapillary atrophy Peripapillary atrophy   C/D Ratio 0.55 0.55   Macula Geographic atrophy, Pigmented atrophy Geographic atrophy, Pigmented atrophy   Vessels Normal Normal   Periphery Normal, no holes or tears Normal, no holes or tears          IMAGING AND PROCEDURES  Imaging and Procedures for 12/07/19  Color Fundus Photography Optos - OU - Both Eyes       Right Eye Progression has been stable. Disc findings include normal observations. Macula : normal observations. Vessels : normal observations.  Periphery : normal observations.   Left Eye Progression has been stable. Disc findings include normal observations. Macula : normal observations. Vessels : normal observations. Periphery : normal observations.   Notes Myopic macular degeneration, pigmentary changes in the macular each eye, no evidence of hemorrhagic change, no elevations and no signs of CNVM OU.                ASSESSMENT/PLAN:  Bilateral degenerative progressive high myopia OU stable, no change over time  Early stage nonexudative age-related macular degeneration of both eyes Stable OU, good acuity      ICD-10-CM   1. Bilateral degenerative progressive high myopia  H44.23 Color Fundus Photography Optos - OU - Both Eyes  2. Posterior vitreous detachment of both eyes  H43.813   3. Early stage nonexudative age-related macular degeneration of both eyes  H35.3131 Color Fundus Photography Optos - OU - Both Eyes    1. No change over the last year OU, both eyes with myopic macular degeneration, some pigmentary degeneration no active CNVM. Posterior vitreous detachment present OU.  2. Dilate OU next with OCT evaluation  3.  Ophthalmic Meds Ordered this visit:  No orders of the defined types were placed in this encounter.      Return in  about 1 year (around 12/06/2020) for DILATE OU, OCT.  There are no Patient Instructions on file for this visit.   Explained the diagnoses, plan, and follow up with the patient and they expressed understanding.  Patient expressed understanding of the importance of proper follow up care.   Clent Demark Francis Doenges M.D. Diseases & Surgery of the Retina and Vitreous Retina [facility] Diabetic [place] 12/07/19     Abbreviations: M myopia (nearsighted); A astigmatism; H hyperopia (farsighted); P presbyopia; Mrx spectacle prescription;  CTL contact lenses; OD right eye; OS left eye; OU both eyes  XT exotropia; ET esotropia; PEK punctate epithelial keratitis; PEE punctate epithelial erosions;  DES dry eye syndrome; MGD meibomian gland dysfunction; ATs artificial tears; PFAT's preservative free artificial tears; [place] nuclear sclerotic cataract; PSC posterior subcapsular cataract; ERM epi-retinal membrane; PVD posterior vitreous detachment; RD retinal detachment; DM diabetes mellitus; DR diabetic retinopathy; NPDR non-proliferative diabetic retinopathy; PDR proliferative diabetic retinopathy; CSME clinically significant macular edema; DME diabetic macular edema; dbh dot blot hemorrhages; CWS cotton wool spot; POAG primary open angle glaucoma; C/D cup-to-disc ratio; HVF humphrey visual field; GVF goldmann visual field; OCT optical coherence tomography; IOP intraocular pressure; BRVO Branch retinal vein occlusion; CRVO central retinal vein occlusion; CRAO central retinal artery occlusion; BRAO branch retinal artery occlusion; RT retinal tear; SB scleral buckle; PPV pars plana vitrectomy; VH Vitreous hemorrhage; PRP panretinal laser photocoagulation; IVK intravitreal kenalog; VMT vitreomacular traction; MH Macular hole;  NVD neovascularization of the disc; NVE neovascularization elsewhere; AREDS age related eye disease study; ARMD age related macular degeneration; POAG primary open angle glaucoma; EBMD epithelial/anterior basement membrane dystrophy; ACIOL anterior chamber intraocular lens; IOL intraocular lens; PCIOL posterior chamber intraocular lens; Phaco/IOL phacoemulsification with intraocular lens placement; [place] photorefractive keratectomy; LASIK laser assisted in situ keratomileusis; HTN hypertension; DM diabetes mellitus; COPD chronic obstructive pulmonary disease

## 2019-12-07 NOTE — Assessment & Plan Note (Signed)
OU stable, no change over time

## 2019-12-07 NOTE — Assessment & Plan Note (Signed)
Stable OU, good acuity

## 2019-12-14 DIAGNOSIS — M75111 Incomplete rotator cuff tear or rupture of right shoulder, not specified as traumatic: Secondary | ICD-10-CM | POA: Diagnosis not present

## 2019-12-14 DIAGNOSIS — M25511 Pain in right shoulder: Secondary | ICD-10-CM | POA: Diagnosis not present

## 2019-12-14 DIAGNOSIS — M19011 Primary osteoarthritis, right shoulder: Secondary | ICD-10-CM | POA: Diagnosis not present

## 2019-12-16 DIAGNOSIS — H31013 Macula scars of posterior pole (postinflammatory) (post-traumatic), bilateral: Secondary | ICD-10-CM | POA: Diagnosis not present

## 2019-12-16 DIAGNOSIS — H35033 Hypertensive retinopathy, bilateral: Secondary | ICD-10-CM | POA: Diagnosis not present

## 2019-12-16 DIAGNOSIS — H524 Presbyopia: Secondary | ICD-10-CM | POA: Diagnosis not present

## 2019-12-16 DIAGNOSIS — H40013 Open angle with borderline findings, low risk, bilateral: Secondary | ICD-10-CM | POA: Diagnosis not present

## 2019-12-16 DIAGNOSIS — H35013 Changes in retinal vascular appearance, bilateral: Secondary | ICD-10-CM | POA: Diagnosis not present

## 2019-12-27 ENCOUNTER — Ambulatory Visit: Payer: PPO

## 2019-12-27 ENCOUNTER — Other Ambulatory Visit: Payer: Self-pay

## 2019-12-27 DIAGNOSIS — Z7901 Long term (current) use of anticoagulants: Secondary | ICD-10-CM | POA: Diagnosis not present

## 2019-12-27 DIAGNOSIS — I480 Paroxysmal atrial fibrillation: Secondary | ICD-10-CM

## 2019-12-27 LAB — POCT INR: INR: 2.6 (ref 2.0–3.0)

## 2019-12-27 NOTE — Patient Instructions (Signed)
Description   Continue on same dosage 2.5 tablets daily except 2 tablets on Saturdays. Be consistent with your greens.  Recheck in 5 weeks. Call if placed on any new medications [phone].

## 2019-12-29 ENCOUNTER — Other Ambulatory Visit: Payer: Self-pay | Admitting: Cardiology

## 2019-12-29 DIAGNOSIS — I480 Paroxysmal atrial fibrillation: Secondary | ICD-10-CM

## 2020-01-04 DIAGNOSIS — M720 Palmar fascial fibromatosis [Dupuytren]: Secondary | ICD-10-CM | POA: Diagnosis not present

## 2020-01-04 DIAGNOSIS — L821 Other seborrheic keratosis: Secondary | ICD-10-CM | POA: Diagnosis not present

## 2020-01-04 DIAGNOSIS — Z85828 Personal history of other malignant neoplasm of skin: Secondary | ICD-10-CM | POA: Diagnosis not present

## 2020-01-04 DIAGNOSIS — D1801 Hemangioma of skin and subcutaneous tissue: Secondary | ICD-10-CM | POA: Diagnosis not present

## 2020-01-04 DIAGNOSIS — L570 Actinic keratosis: Secondary | ICD-10-CM | POA: Diagnosis not present

## 2020-01-04 DIAGNOSIS — D2271 Melanocytic nevi of right lower limb, including hip: Secondary | ICD-10-CM | POA: Diagnosis not present

## 2020-01-04 DIAGNOSIS — B356 Tinea cruris: Secondary | ICD-10-CM | POA: Diagnosis not present

## 2020-01-07 DIAGNOSIS — I77811 Abdominal aortic ectasia: Secondary | ICD-10-CM | POA: Diagnosis not present

## 2020-01-07 DIAGNOSIS — Z23 Encounter for immunization: Secondary | ICD-10-CM | POA: Diagnosis not present

## 2020-01-07 DIAGNOSIS — J4520 Mild intermittent asthma, uncomplicated: Secondary | ICD-10-CM | POA: Diagnosis not present

## 2020-01-07 DIAGNOSIS — I4891 Unspecified atrial fibrillation: Secondary | ICD-10-CM | POA: Diagnosis not present

## 2020-01-07 DIAGNOSIS — I700 Atherosclerosis of aorta: Secondary | ICD-10-CM | POA: Diagnosis not present

## 2020-01-07 DIAGNOSIS — Z0000 Encounter for general adult medical examination without abnormal findings: Secondary | ICD-10-CM | POA: Diagnosis not present

## 2020-01-07 DIAGNOSIS — I10 Essential (primary) hypertension: Secondary | ICD-10-CM | POA: Diagnosis not present

## 2020-01-07 DIAGNOSIS — E785 Hyperlipidemia, unspecified: Secondary | ICD-10-CM | POA: Diagnosis not present

## 2020-01-07 DIAGNOSIS — G4733 Obstructive sleep apnea (adult) (pediatric): Secondary | ICD-10-CM | POA: Diagnosis not present

## 2020-01-07 DIAGNOSIS — F17210 Nicotine dependence, cigarettes, uncomplicated: Secondary | ICD-10-CM | POA: Diagnosis not present

## 2020-01-07 DIAGNOSIS — R7303 Prediabetes: Secondary | ICD-10-CM | POA: Diagnosis not present

## 2020-01-13 DIAGNOSIS — E785 Hyperlipidemia, unspecified: Secondary | ICD-10-CM | POA: Diagnosis not present

## 2020-01-13 DIAGNOSIS — F322 Major depressive disorder, single episode, severe without psychotic features: Secondary | ICD-10-CM | POA: Diagnosis not present

## 2020-01-13 DIAGNOSIS — I4891 Unspecified atrial fibrillation: Secondary | ICD-10-CM | POA: Diagnosis not present

## 2020-01-13 DIAGNOSIS — F329 Major depressive disorder, single episode, unspecified: Secondary | ICD-10-CM | POA: Diagnosis not present

## 2020-01-13 DIAGNOSIS — J4520 Mild intermittent asthma, uncomplicated: Secondary | ICD-10-CM | POA: Diagnosis not present

## 2020-01-13 DIAGNOSIS — I10 Essential (primary) hypertension: Secondary | ICD-10-CM | POA: Diagnosis not present

## 2020-01-13 DIAGNOSIS — C61 Malignant neoplasm of prostate: Secondary | ICD-10-CM | POA: Diagnosis not present

## 2020-01-21 DIAGNOSIS — Z6829 Body mass index (BMI) 29.0-29.9, adult: Secondary | ICD-10-CM | POA: Diagnosis not present

## 2020-01-21 DIAGNOSIS — E663 Overweight: Secondary | ICD-10-CM | POA: Diagnosis not present

## 2020-01-21 DIAGNOSIS — M7702 Medial epicondylitis, left elbow: Secondary | ICD-10-CM | POA: Diagnosis not present

## 2020-01-21 DIAGNOSIS — M150 Primary generalized (osteo)arthritis: Secondary | ICD-10-CM | POA: Diagnosis not present

## 2020-01-21 DIAGNOSIS — M1A09X0 Idiopathic chronic gout, multiple sites, without tophus (tophi): Secondary | ICD-10-CM | POA: Diagnosis not present

## 2020-01-31 ENCOUNTER — Ambulatory Visit (INDEPENDENT_AMBULATORY_CARE_PROVIDER_SITE_OTHER): Payer: PPO | Admitting: *Deleted

## 2020-01-31 DIAGNOSIS — F322 Major depressive disorder, single episode, severe without psychotic features: Secondary | ICD-10-CM | POA: Diagnosis not present

## 2020-01-31 DIAGNOSIS — K219 Gastro-esophageal reflux disease without esophagitis: Secondary | ICD-10-CM | POA: Diagnosis not present

## 2020-01-31 DIAGNOSIS — I480 Paroxysmal atrial fibrillation: Secondary | ICD-10-CM | POA: Diagnosis not present

## 2020-01-31 DIAGNOSIS — F329 Major depressive disorder, single episode, unspecified: Secondary | ICD-10-CM | POA: Diagnosis not present

## 2020-01-31 DIAGNOSIS — I4891 Unspecified atrial fibrillation: Secondary | ICD-10-CM | POA: Diagnosis not present

## 2020-01-31 DIAGNOSIS — I10 Essential (primary) hypertension: Secondary | ICD-10-CM | POA: Diagnosis not present

## 2020-01-31 DIAGNOSIS — E785 Hyperlipidemia, unspecified: Secondary | ICD-10-CM | POA: Diagnosis not present

## 2020-01-31 DIAGNOSIS — J4520 Mild intermittent asthma, uncomplicated: Secondary | ICD-10-CM | POA: Diagnosis not present

## 2020-01-31 DIAGNOSIS — C61 Malignant neoplasm of prostate: Secondary | ICD-10-CM | POA: Diagnosis not present

## 2020-01-31 LAB — POCT INR: INR: 2.2 (ref 2.0–3.0)

## 2020-01-31 NOTE — Patient Instructions (Signed)
Description   Continue on same dosage 2.5 tablets daily except 2 tablets on Saturdays. Be consistent with your greens.  Recheck in 6 weeks. Call if placed on any new medications [phone].

## 2020-02-19 ENCOUNTER — Other Ambulatory Visit: Payer: Self-pay | Admitting: Cardiology

## 2020-02-23 NOTE — Progress Notes (Signed)
CARDIOLOGY OFFICE NOTE  Date:  03/08/2020    Jonathan Vargas Date of Birth: 1946/02/21 Medical Record #[id number]  PCP:  Leeroy Cha, MD  Cardiologist:  Marisa Cyphers  Chief Complaint  Patient presents with  . Follow-up    Seen for Dr. Marlou Porch    History of Present Illness: Jonathan Vargas is a 74 y.o. adult who presents today for a follow up visit. Seen for Dr. Marlou Porch & Allred.    [name]".    He has a history of atrial fibrillation/flutter paroxysmal on antiarrhythmic and is on chronic warfarin for his anticoagulation. Had stress test 09/01/12 that demonstrated a mild inferior wall defect, possible diaphragmatic attenuation, overall low risk study with normal ejection fraction. Other issues include HTN, prior carotid artery dissection in 1997, ongoing tobacco abuse, hemachromatosis. Previous hospitalization in November 2012 demonstrated paroxysmal atrial flutter which converted after 4 hours of diltiazem. He had a postconversion pause of 8.2 seconds, felt flushed.  Endorses chronic palpitations.    I saw him as a work in back in March of 2018 - out of rhythm. Somewhat symptomatic. Got him cardioverted and back to see [name] for discussion of other options - but opted for his continued regimen. ARB was restarted for elevated BP. Patient elected to continue CCB therapy despite bradycardia. He has continued on his anticoagulation with coumadin.     Dr. Marlou Porch and I have followed him since. He has had intermittent issues with his BP. He has ongoing use of alcohol. He has had to have CCB eventually stopped due to presyncope. There was concern for post conversion pauses which has been documented in the past.    He has actually made good progress over the past couple of years with losing weight - after going to diabetic classes. Has had some atypical chest pain - has needed clearance for GU surgery and we have updated his stress test (October 2019) which was  reassuring.  Still smoking a few cigarettes and alcohol use continues. Cardiac status has been ok. Last seen by me back in May [facility]. Had had some spells of lightheadedness - concern HR too low. Smoking 5 cigs and having 2 glasses of wine. I had him wear a 14 day monitor - average HR was 55.   Last seen by me back in September - remained bradycardic. Weight up some. Some fatigue that he thought was more related to his sinus issues. Continues to smoke some and have his 2 glasses of wine nightly.    Comes in today. Here alone. Still working out but not "going as hard" - has lost interest "or something". He is not sure he if he is bored - but can't push himself as he could previously. Not really dizzy or lightheaded. No syncope. He has had some GERD - uses Gaviscon and it works for him. No chest pain with working out. Has seen ENT for epistaxis - felt to be due to too much Flonase. Coumadin doing well. No other bleeding. Labs from October noted. He has been placed on low dose Crestor. Overall, he feels like he is doing ok.   Past Medical History:  Diagnosis Date  . Acid reflux   . Arthritis   . ASD (atrial septal defect)   . Asthma with allergic rhinitis and status asthmaticus    NO RECENT FLARE UPS  . Atrial fib/flutter, transient   . Cancer ([place])  skin  . Chronic anticoagulation   . Depression   . Dysrhythmia    ATRIAL FIB  . ENT complaint    Dr. Erik Obey  . Erectile dysfunction   . Fatty liver   . FHx: allergies   . Gout    , Possible, Right Knee x2 ; intermittent milder episodes of pain in hte first MTP - uric acid 7.7- no treatment so far.  . Hemochromatosis    DOES TWICE YEAR  . History of kidney stones    SMALL IN RIGHT KIDNEY  . Hypercholesteremia   . Hyperlipemia    PT DENIES  . Hypertension   . Hypogonadism male   . Impaired fasting glucose   . 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  . Pneumonia    " walking PNA > 40 years ago"  . Prostate cancer ([place]) [place] 2018  . Seasonal allergies   . Sleep apnea    wears CPAP ( set at 4-11)  . Trigger finger, left    , fourth finger    Past Surgical History:  Procedure Laterality Date  . CARDIOVERSION N/A 05/29/2016   Procedure: CARDIOVERSION;  Surgeon: [name], MD;  Location: [facility];  Service: Cardiovascular;  Laterality: N/A;  . CATARACT EXTRACTION W/ INTRAOCULAR LENS  IMPLANT, BILATERAL  2016  . COLONOSCOPY WITH PROPOFOL N/A 09/26/2016   Procedure: COLONOSCOPY WITH PROPOFOL;  Surgeon: Wilford Corner, MD;  Location: [facility];  Service: Endoscopy;  Laterality: N/A;  . CYSTOSCOPY  01/23/2018   Procedure: CYSTOSCOPY FLEXIBLE;  Surgeon: Franchot Gallo, MD;  Location: [facility];  Service: Urology;;  no seeds found in bladder  . ESOPHAGOGASTRODUODENOSCOPY (EGD) WITH PROPOFOL N/A 09/26/2016   Procedure: ESOPHAGOGASTRODUODENOSCOPY (EGD) WITH PROPOFOL;  Surgeon: Wilford Corner, MD;  Location: [facility];  Service: Endoscopy;  Laterality: N/A;  . LIVER BIOPSY  MANY YRS AGO  . NASAL SINUS SURGERY  1997  . RADIOACTIVE SEED IMPLANT N/A 01/23/2018   Procedure: RADIOACTIVE SEED IMPLANT/BRACHYTHERAPY IMPLANT;  Surgeon: Franchot Gallo, MD;  Location: [facility];  Service: Urology;  Laterality: N/A;   69 seeds implanted  . SPACE OAR INSTILLATION N/A 01/23/2018   Procedure: SPACE OAR INSTILLATION;  Surgeon: Franchot Gallo, MD;  Location: [facility];  Service: Urology;  Laterality: N/A;     Medications: Current Meds  Medication Sig  . albuterol (PROVENTIL HFA;VENTOLIN HFA) 108 (90 Base) MCG/ACT inhaler Inhale 2 puffs into the lungs every 6 (six) hours as needed for wheezing or shortness of breath.  . allopurinol (ZYLOPRIM) 300 MG tablet Take 300 mg by mouth every evening.   Marland Kitchen CALCIUM PO Take 200 mg by mouth in the morning, at noon, and at  bedtime.   . Cholecalciferol (VITAMIN D3) 125 MCG (5000 UT) CAPS Take 1 capsule by mouth daily.  Marland Kitchen COLCRYS 0.6 MG tablet Take 0.6 mg by mouth 2 (two) times daily as needed (for gout).   Marland Kitchen diclofenac sodium (VOLTAREN) 1 % GEL Apply 1 application topically 4 (four) times daily as needed (for pain).   . fexofenadine (ALLEGRA) 60 MG tablet Take 60 mg by mouth daily as needed. For allergies  . flecainide (TAMBOCOR) 50 MG tablet TAKE 1 TABLET BY MOUTH 2 TIMES DAILY FOR THE HEART.  . fluticasone (FLONASE) 50 MCG/ACT nasal spray Place 1 spray into the nose every evening.  . irbesartan (AVAPRO) 150 MG tablet TAKE 1 TABLET BY MOUTH DAILY.  Marland Kitchen MAGNESIUM PO Take  4 capsules by mouth every evening.   . Menaquinone-7 (VITAMIN K2) 100 MCG CAPS Take by mouth daily.  . Misc Natural Products (LUTEIN 20) CAPS Take 40 mg by mouth 2 (two) times daily.   . NON FORMULARY CPAP  . TURMERIC PO Take 1 capsule by mouth 2 (two) times daily.   . vitamin B-12 (CYANOCOBALAMIN) 1000 MCG tablet Take 1,000 mcg by mouth daily.  Marland Kitchen warfarin (COUMADIN) 5 MG tablet TAKE 2 TO 2 AND 1/2 TABLETS BY MOUTH AS DIRECTED BT COUMADIN CLINIC     Allergies: Allergies  Allergen Reactions  . Clindamycin/Lincomycin Diarrhea and Other (See Comments)    Stomach pain   . Ciprofloxacin Other (See Comments)    Joint pain  . Amoxicillin Rash    Social History: The patient  reports that he has been smoking cigarettes. He started smoking about 6 years ago. He has a 12.50 pack-year smoking history. He has quit using smokeless tobacco.  His smokeless tobacco use included chew. He reports current alcohol use. He reports that he does not use drugs.   Family History: The patient's family history includes Breast cancer in his sister; Colon cancer in his father; Diabetes type II in his father and maternal grandfather; Heart disease in his mother; Leukemia in his paternal uncle; Prostate cancer in an other family member; Stroke in his father.   Review  of Systems: Please see the history of present illness.   All other systems are reviewed and negative.   Physical Exam: VS:  BP 140/80   Pulse (!) 51   Ht 6\' 1"  (1.854 m)   Wt 219 lb (99.3 kg)   SpO2 97%   BMI 28.89 kg/m  .  BMI Body mass index is 28.89 kg/m.  Wt Readings from Last 3 Encounters:  03/08/20 219 lb (99.3 kg)  11/29/19 218 lb 3.2 oz (99 kg)  07/26/19 211 lb 1.9 oz (95.8 kg)    General: Alert and in no acute distress.   Cardiac: Regular rate and rhythm. No murmurs, rubs, or gallops. No edema.  Respiratory:  Lungs are clear to auscultation bilaterally with normal work of breathing.  GI: Soft and nontender.  MS: No deformity or atrophy. Gait and ROM intact.  Skin: Warm and dry. Color is normal.  Neuro:  Strength and sensation are intact and no gross focal deficits noted.  Psych: Alert, appropriate and with normal affect.   LABORATORY DATA:  EKG:  EKG is ordered today.  Personally reviewed by me. This demonstrates sinus bradycardia - non specific T wave changes - unchanged - HR is 51 today.  Lab Results  Component Value Date   WBC 5.9 07/26/2019   HGB 12.8 (L) 12/02/2019   HCT 42.9 07/26/2019   PLT 221 07/26/2019   GLUCOSE 95 07/26/2019   CHOL 164 07/26/2019   TRIG 163 (H) 07/26/2019   HDL 43 07/26/2019   LDLCALC 93 07/26/2019   ALT 14 07/26/2019   AST 18 07/26/2019   NA 136 07/26/2019   K 4.6 07/26/2019   CL 102 07/26/2019   CREATININE 1.07 07/26/2019   BUN 26 07/26/2019   CO2 22 07/26/2019   TSH 1.550 07/26/2019   INR 2.2 01/31/2020     Lab Results  Component Value Date   INR 2.2 01/31/2020   INR 2.6 12/27/2019   INR 2.6 11/29/2019     BNP (last 3 results) No results for input(s): BNP in the last 8760 hours.  ProBNP (last 3 results) No results  for input(s): PROBNP in the last 8760 hours.   Other Studies Reviewed Today:  14 Day ZIO Study Highlights 07/2019      Sinus rhythm with average HR 55 bpm, minimum 32 bpm during sleep  (benign), peak 132 bpm  No atrial fibrillation, no pauses  Rare paroxysmal atrial tachycardia (7 beats duration) - asymptomatic  Rare PVC's, PAC's - 2 episodes of chest discomfort in diary sinus rhythm with one isolated PVC - benign, rare bigeminy  Brief idioventricular rhythm during sleep.  No ventricular tachycardia   Overall reassuring monitor On low dose Flecainide Candee Furbish, MD   Myoview Study Highlights 12/2017    Nuclear stress EF: 55%.  There was no ST segment deviation noted during stress.  The study is normal.  This is a low risk study.  The left ventricular ejection fraction is normal (55-65%).      Echo Study Conclusions 05/2016   - Left ventricle: The cavity size was normal. Wall thickness was   increased in a pattern of mild LVH. Systolic function was normal.   The estimated ejection fraction was in the range of 55% to 60%.   Wall motion was normal; there were no regional wall motion   abnormalities. Left ventricular diastolic function parameters   were normal. - Left atrium: The atrium was moderately dilated. - Atrial septum: No defect or patent foramen ovale was identified.   Procedure: Electrical Cardioversion Indications:  Atrial Flutter   Procedure Details Consent: Risks of procedure as well as the alternatives and risks of each were explained to the (patient/caregiver).  Consent for procedure obtained. Time Out: Verified patient identification, verified procedure, site/side was marked, verified correct patient position, special equipment/implants available, medications/allergies/relevent history reviewed, required imaging and test results available.  Performed   Patient placed on cardiac monitor, pulse oximetry, supplemental oxygen as necessary.  Sedation given: Propofol 70 mg Pacer pads placed anterior and posterior chest.  Cardioverted 1 time(s).  Cardioverted at 120J. Evaluation Findings: Post procedure EKG shows: NSR Complications:  None Patient did tolerate procedure well.   [name], MD 05/29/2016, 1:53 PM     ASSESSMENT & PLAN:     1. Fatigue - prior monitor was stable- I think this is more multifactorial - needs to keep up with his exercise program.   2. Persistent Bradycardia - HR ok today.   3. PAF - remains in sinus - on Flecainide  4. Probable SSS - at some point will most likely need PPM.   5. Chronic coumadin therapy - no problems noted.   6. HTN - BP fair - would follow - he is going to try and work on losing some weight.   7. OSA - on CPAP  8. Alcohol/tobacco use   Current medicines are reviewed with the patient today.  The patient does not have concerns regarding medicines other than what has been noted above.  The following changes have been made:  See above.  Labs/ tests ordered today include:    Orders Placed This Encounter  Procedures  . EKG 12-Lead     Disposition:   FU with Dr. Marlou Porch in about 4 months. He is aware that I am leaving in February.      Patient is agreeable to this plan and will call if any problems develop in the interim.   SignedTruitt Merle, NP  03/08/2020 10:37 AM  [place] [address] [place], [place]  [address] Phone: [phone] Fax: [phone])  [phone]        

## 2020-02-28 DIAGNOSIS — F17290 Nicotine dependence, other tobacco product, uncomplicated: Secondary | ICD-10-CM | POA: Diagnosis not present

## 2020-02-28 DIAGNOSIS — J3489 Other specified disorders of nose and nasal sinuses: Secondary | ICD-10-CM | POA: Diagnosis not present

## 2020-02-28 DIAGNOSIS — R040 Epistaxis: Secondary | ICD-10-CM | POA: Diagnosis not present

## 2020-02-28 DIAGNOSIS — Z7901 Long term (current) use of anticoagulants: Secondary | ICD-10-CM | POA: Diagnosis not present

## 2020-02-28 DIAGNOSIS — F17210 Nicotine dependence, cigarettes, uncomplicated: Secondary | ICD-10-CM | POA: Diagnosis not present

## 2020-03-07 ENCOUNTER — Ambulatory Visit: Payer: PPO | Admitting: Nurse Practitioner

## 2020-03-08 ENCOUNTER — Ambulatory Visit (INDEPENDENT_AMBULATORY_CARE_PROVIDER_SITE_OTHER): Payer: PPO | Admitting: *Deleted

## 2020-03-08 ENCOUNTER — Other Ambulatory Visit: Payer: Self-pay

## 2020-03-08 ENCOUNTER — Ambulatory Visit: Payer: PPO | Admitting: Nurse Practitioner

## 2020-03-08 ENCOUNTER — Encounter: Payer: Self-pay | Admitting: Nurse Practitioner

## 2020-03-08 VITALS — BP 140/80 | HR 51 | Ht 73.0 in | Wt 219.0 lb

## 2020-03-08 DIAGNOSIS — Z7901 Long term (current) use of anticoagulants: Secondary | ICD-10-CM

## 2020-03-08 DIAGNOSIS — I10 Essential (primary) hypertension: Secondary | ICD-10-CM

## 2020-03-08 DIAGNOSIS — R001 Bradycardia, unspecified: Secondary | ICD-10-CM

## 2020-03-08 DIAGNOSIS — I480 Paroxysmal atrial fibrillation: Secondary | ICD-10-CM | POA: Diagnosis not present

## 2020-03-08 DIAGNOSIS — Z79899 Other long term (current) drug therapy: Secondary | ICD-10-CM

## 2020-03-08 LAB — POCT INR: INR: 2.5 (ref 2.0–3.0)

## 2020-03-08 NOTE — Patient Instructions (Addendum)
After Visit Summary:  We will be checking the following labs today - NONE   Medication Instructions:    Continue with your current medicines.    If you need a refill on your cardiac medications before your next appointment, please call your pharmacy.     Testing/Procedures To Be Arranged:  N/A  Follow-Up:   See Dr. Skains in 4 months.     At [facility], you and your health needs are our priority.  As part of our continuing mission to provide you with exceptional heart care, we have created designated Provider Care Teams.  These Care Teams include your primary Cardiologist (physician) and Advanced Practice Providers (APPs -  Physician Assistants and Nurse Practitioners) who all work together to provide you with the care you need, when you need it.  Special Instructions:  . Stay safe, wash your hands for at least 20 seconds and wear a mask when needed.  . It was good to talk with you today.    Call the [facility] office at [phone] if you have any questions, problems or concerns.       

## 2020-03-08 NOTE — Patient Instructions (Signed)
Description   Continue taking Warfarin 2.5 tablets daily except 2 tablets on Saturdays. Remain consistent with your greens. Recheck in 6 weeks. Call if placed on any new medications [phone].        

## 2020-04-03 DIAGNOSIS — K219 Gastro-esophageal reflux disease without esophagitis: Secondary | ICD-10-CM | POA: Diagnosis not present

## 2020-04-03 DIAGNOSIS — I10 Essential (primary) hypertension: Secondary | ICD-10-CM | POA: Diagnosis not present

## 2020-04-03 DIAGNOSIS — C61 Malignant neoplasm of prostate: Secondary | ICD-10-CM | POA: Diagnosis not present

## 2020-04-03 DIAGNOSIS — J4520 Mild intermittent asthma, uncomplicated: Secondary | ICD-10-CM | POA: Diagnosis not present

## 2020-04-03 DIAGNOSIS — F329 Major depressive disorder, single episode, unspecified: Secondary | ICD-10-CM | POA: Diagnosis not present

## 2020-04-03 DIAGNOSIS — I4891 Unspecified atrial fibrillation: Secondary | ICD-10-CM | POA: Diagnosis not present

## 2020-04-03 DIAGNOSIS — E785 Hyperlipidemia, unspecified: Secondary | ICD-10-CM | POA: Diagnosis not present

## 2020-04-18 DIAGNOSIS — Z86010 Personal history of colonic polyps: Secondary | ICD-10-CM | POA: Diagnosis not present

## 2020-04-18 DIAGNOSIS — R159 Full incontinence of feces: Secondary | ICD-10-CM | POA: Diagnosis not present

## 2020-04-18 DIAGNOSIS — Z800 Family history of malignant neoplasm of digestive organs: Secondary | ICD-10-CM | POA: Diagnosis not present

## 2020-04-18 DIAGNOSIS — R194 Change in bowel habit: Secondary | ICD-10-CM | POA: Diagnosis not present

## 2020-04-19 ENCOUNTER — Ambulatory Visit (INDEPENDENT_AMBULATORY_CARE_PROVIDER_SITE_OTHER): Payer: PPO | Admitting: *Deleted

## 2020-04-19 ENCOUNTER — Other Ambulatory Visit: Payer: Self-pay

## 2020-04-19 DIAGNOSIS — I480 Paroxysmal atrial fibrillation: Secondary | ICD-10-CM | POA: Diagnosis not present

## 2020-04-19 DIAGNOSIS — Z7901 Long term (current) use of anticoagulants: Secondary | ICD-10-CM | POA: Diagnosis not present

## 2020-04-19 LAB — POCT INR: INR: 2.9 (ref 2.0–3.0)

## 2020-04-19 NOTE — Patient Instructions (Signed)
Description   Continue taking Warfarin 2.5 tablets daily except 2 tablets on Saturdays. Remain consistent with your greens. Recheck in 6 weeks. Call if placed on any new medications [phone].

## 2020-05-09 ENCOUNTER — Encounter: Payer: Self-pay | Admitting: Cardiology

## 2020-05-12 ENCOUNTER — Other Ambulatory Visit: Payer: Self-pay

## 2020-05-12 ENCOUNTER — Ambulatory Visit (INDEPENDENT_AMBULATORY_CARE_PROVIDER_SITE_OTHER): Payer: PPO | Admitting: *Deleted

## 2020-05-12 DIAGNOSIS — Z5181 Encounter for therapeutic drug level monitoring: Secondary | ICD-10-CM

## 2020-05-12 DIAGNOSIS — I480 Paroxysmal atrial fibrillation: Secondary | ICD-10-CM

## 2020-05-12 LAB — POCT INR: INR: 2.4 (ref 2.0–3.0)

## 2020-05-12 NOTE — Patient Instructions (Signed)
Description   Continue taking Warfarin 2.5 tablets daily except 2 tablets on Saturdays. Remain consistent with your greens.  Recheck INR on 3/21 dental work on 3/23 (usually has INR rechecked every 6 weeks). Call if placed on any new medications [phone].

## 2020-05-30 DIAGNOSIS — F322 Major depressive disorder, single episode, severe without psychotic features: Secondary | ICD-10-CM | POA: Diagnosis not present

## 2020-05-30 DIAGNOSIS — J4520 Mild intermittent asthma, uncomplicated: Secondary | ICD-10-CM | POA: Diagnosis not present

## 2020-05-30 DIAGNOSIS — K219 Gastro-esophageal reflux disease without esophagitis: Secondary | ICD-10-CM | POA: Diagnosis not present

## 2020-05-30 DIAGNOSIS — I10 Essential (primary) hypertension: Secondary | ICD-10-CM | POA: Diagnosis not present

## 2020-05-30 DIAGNOSIS — C61 Malignant neoplasm of prostate: Secondary | ICD-10-CM | POA: Diagnosis not present

## 2020-05-30 DIAGNOSIS — E785 Hyperlipidemia, unspecified: Secondary | ICD-10-CM | POA: Diagnosis not present

## 2020-05-30 DIAGNOSIS — I4891 Unspecified atrial fibrillation: Secondary | ICD-10-CM | POA: Diagnosis not present

## 2020-06-02 ENCOUNTER — Other Ambulatory Visit (HOSPITAL_COMMUNITY): Payer: Self-pay | Admitting: *Deleted

## 2020-06-05 ENCOUNTER — Encounter (HOSPITAL_COMMUNITY): Payer: PPO

## 2020-06-05 DIAGNOSIS — Z7901 Long term (current) use of anticoagulants: Secondary | ICD-10-CM | POA: Diagnosis not present

## 2020-06-05 DIAGNOSIS — R195 Other fecal abnormalities: Secondary | ICD-10-CM | POA: Diagnosis not present

## 2020-06-05 DIAGNOSIS — R197 Diarrhea, unspecified: Secondary | ICD-10-CM | POA: Diagnosis not present

## 2020-06-05 DIAGNOSIS — R42 Dizziness and giddiness: Secondary | ICD-10-CM | POA: Diagnosis not present

## 2020-06-05 DIAGNOSIS — D7589 Other specified diseases of blood and blood-forming organs: Secondary | ICD-10-CM | POA: Diagnosis not present

## 2020-06-05 LAB — PROTIME-INR: INR: 2.1 — AB (ref 0.9–1.1)

## 2020-06-08 DIAGNOSIS — G4733 Obstructive sleep apnea (adult) (pediatric): Secondary | ICD-10-CM | POA: Diagnosis not present

## 2020-06-13 DIAGNOSIS — J3489 Other specified disorders of nose and nasal sinuses: Secondary | ICD-10-CM | POA: Diagnosis not present

## 2020-06-13 DIAGNOSIS — J0101 Acute recurrent maxillary sinusitis: Secondary | ICD-10-CM | POA: Diagnosis not present

## 2020-06-15 ENCOUNTER — Telehealth: Payer: Self-pay | Admitting: *Deleted

## 2020-06-15 DIAGNOSIS — H40013 Open angle with borderline findings, low risk, bilateral: Secondary | ICD-10-CM | POA: Diagnosis not present

## 2020-06-15 NOTE — Telephone Encounter (Signed)
Pt overdue to have INR checked. Called and spoke to pt who stated that he started a 10 day course of doxy on 3/29. Scheduled pt to come in on 4/1 to have INR checked.

## 2020-06-16 ENCOUNTER — Ambulatory Visit (INDEPENDENT_AMBULATORY_CARE_PROVIDER_SITE_OTHER): Payer: PPO

## 2020-06-16 ENCOUNTER — Other Ambulatory Visit: Payer: Self-pay

## 2020-06-16 DIAGNOSIS — Z7901 Long term (current) use of anticoagulants: Secondary | ICD-10-CM | POA: Diagnosis not present

## 2020-06-16 DIAGNOSIS — I480 Paroxysmal atrial fibrillation: Secondary | ICD-10-CM

## 2020-06-16 DIAGNOSIS — Z5181 Encounter for therapeutic drug level monitoring: Secondary | ICD-10-CM

## 2020-06-16 LAB — POCT INR: INR: 2.7 (ref 2.0–3.0)

## 2020-06-16 NOTE — Patient Instructions (Signed)
-   Continue taking Warfarin 2.5 tablets daily except 2 tablets on Saturdays. - Remain consistent with your greens.   - Recheck INR in 3 weeks. Call if placed on any new medications [phone].

## 2020-06-22 ENCOUNTER — Other Ambulatory Visit: Payer: Self-pay | Admitting: *Deleted

## 2020-06-22 DIAGNOSIS — I480 Paroxysmal atrial fibrillation: Secondary | ICD-10-CM

## 2020-06-22 MED ORDER — WARFARIN SODIUM 5 MG PO TABS: ORAL_TABLET | ORAL | 1 refills | Status: DC

## 2020-06-28 ENCOUNTER — Ambulatory Visit: Payer: PPO | Admitting: Cardiology

## 2020-07-04 DIAGNOSIS — J329 Chronic sinusitis, unspecified: Secondary | ICD-10-CM | POA: Diagnosis not present

## 2020-07-05 ENCOUNTER — Ambulatory Visit: Payer: PPO | Admitting: Physician Assistant

## 2020-07-07 ENCOUNTER — Ambulatory Visit (HOSPITAL_COMMUNITY)
Admission: RE | Admit: 2020-07-07 | Discharge: 2020-07-07 | Disposition: A | Discharge status: Home / Self Care | Payer: PPO | Source: Ambulatory Visit | Attending: Internal Medicine | Admitting: Internal Medicine

## 2020-07-07 ENCOUNTER — Other Ambulatory Visit: Payer: Self-pay

## 2020-07-07 DIAGNOSIS — F17210 Nicotine dependence, cigarettes, uncomplicated: Secondary | ICD-10-CM | POA: Diagnosis not present

## 2020-07-07 DIAGNOSIS — E785 Hyperlipidemia, unspecified: Secondary | ICD-10-CM | POA: Diagnosis not present

## 2020-07-07 DIAGNOSIS — I10 Essential (primary) hypertension: Secondary | ICD-10-CM | POA: Diagnosis not present

## 2020-07-07 DIAGNOSIS — J4520 Mild intermittent asthma, uncomplicated: Secondary | ICD-10-CM | POA: Diagnosis not present

## 2020-07-07 LAB — POCT HEMOGLOBIN-HEMACUE: Hemoglobin: 14.1 g/dL (ref 13.0–17.0)

## 2020-07-07 MED ORDER — LIDOCAINE HCL (PF) 1 % IJ SOLN: 2.0000 mL | Freq: Once | INTRAMUSCULAR | Status: DC

## 2020-07-07 NOTE — Progress Notes (Signed)
[facility]  Prior to phlebotomy is 14.1.

## 2020-07-17 DIAGNOSIS — K219 Gastro-esophageal reflux disease without esophagitis: Secondary | ICD-10-CM | POA: Diagnosis not present

## 2020-07-17 DIAGNOSIS — I10 Essential (primary) hypertension: Secondary | ICD-10-CM | POA: Diagnosis not present

## 2020-07-17 DIAGNOSIS — F3341 Major depressive disorder, recurrent, in partial remission: Secondary | ICD-10-CM | POA: Diagnosis not present

## 2020-07-17 DIAGNOSIS — I482 Chronic atrial fibrillation, unspecified: Secondary | ICD-10-CM | POA: Diagnosis not present

## 2020-07-17 DIAGNOSIS — E785 Hyperlipidemia, unspecified: Secondary | ICD-10-CM | POA: Diagnosis not present

## 2020-07-17 DIAGNOSIS — J4520 Mild intermittent asthma, uncomplicated: Secondary | ICD-10-CM | POA: Diagnosis not present

## 2020-07-18 NOTE — Progress Notes (Addendum)
Cardiology Office Note:    Date:  07/19/2020   ID:  Jonathan Vargas, DOB 1945-12-09, MRN [id number]  PCP:  Leeroy Cha, MD   [facility] Providers Cardiologist:  Candee Furbish, MD     Referring MD: Leeroy Cha,*   Chief Complaint:  Follow-up (Atrial fibrillation)    Patient Profile:    Jonathan Vargas is a 75 y.o. 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)  Bradycardia   +cigs  +ETOH  Hemachromatosis  Has phlebotomy 2x per year  Pre-Diabetes mellitus   GERD  Fatty liver  Gout  Prostate CA   OSA   Hyperlipidemia   Intol of high dose statins  Prior CV studies: LONG TERM MONITOR (8-14 DAYS) HOOK UP AND INTERPRETATION 07/26/2019 Narrative  Sinus rhythm with average HR 55 bpm, minimum 32 bpm during sleep (benign), peak 132 bpm  No atrial fibrillation, no pauses  Rare paroxysmal atrial tachycardia (7 beats duration) - asymptomatic  Rare PVC's, PAC's - 2 episodes of chest discomfort in diary sinus rhythm with one isolated PVC - benign, rare bigeminy  Brief idioventricular rhythm during sleep.  No ventricular tachycardia  GATED SPECT MYO PERF W/LEXISCAN STRESS 1D 01/07/2018 Narrative  Nuclear stress EF: 55%.  There was no ST segment deviation noted during stress.  The study is normal.  This is a low risk study.  The left ventricular ejection fraction is normal (55-65%).   Echocardiogram 06/12/17 - Left ventricle: The cavity size was normal. Wall thickness was  increased in a pattern of mild LVH. Systolic function was normal.  The estimated ejection fraction was in the range of 55% to 60%.  Wall motion was normal; there were no regional wall motion  abnormalities. Left  ventricular diastolic function parameters  were normal.  - Left atrium: The atrium was moderately dilated.  - Atrial septum: No defect or patent foramen ovale was identified.   History of Present Illness: Jonathan Vargas was last seen by Truitt Merle, NP in 02/2020.  He returns for f/u.  He returns for follow-up.  He has been doing well.  He has not had significant chest discomfort.  He does work out with a Clinical research associate.  He does have some musculoskeletal pain from time to time.  He has not had syncope or near syncope.  He gets occasional lightheadedness.  This is a chronic symptom without change.  He has not had orthopnea.  He has not had significant shortness of breath.  He does cardio by walking on an elliptical and can get his HR to 102.    Past Medical History:  Diagnosis Date  . Acid reflux   . Arthritis   . ASD (atrial septal defect)   . Asthma with allergic rhinitis and status asthmaticus    NO RECENT FLARE UPS  . Atrial fib/flutter, transient   . Cancer ([place])    skin  . Chronic anticoagulation   . Depression   . Dysrhythmia    ATRIAL FIB  . ENT complaint    Dr. Erik Obey  . Erectile dysfunction   . Fatty liver   . FHx: allergies   . Gout    , Possible, Right Knee x2 ; intermittent milder episodes of pain in hte first MTP - uric acid 7.7- no treatment  so far.  . Hemochromatosis    DOES TWICE YEAR  . History of kidney stones    SMALL IN RIGHT KIDNEY  . Hypercholesteremia   . Hyperlipemia    PT DENIES  . Hypertension   . Hypogonadism male   . Impaired fasting glucose   . 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  . Pneumonia    " walking PNA > 40 years ago"  . Prostate cancer ([place]) [place] 2018  . Seasonal allergies   . Sleep apnea    wears CPAP ( set at 4-11)  . Trigger finger, left    , fourth finger    Current Medications: Current Meds  Medication Sig  . albuterol (PROVENTIL HFA;VENTOLIN HFA) 108  (90 Base) MCG/ACT inhaler Inhale 2 puffs into the lungs every 6 (six) hours as needed for wheezing or shortness of breath.  . allopurinol (ZYLOPRIM) 300 MG tablet Take 300 mg by mouth every evening.   Marland Kitchen CALCIUM PO Take 200 mg by mouth in the morning, at noon, and at bedtime.   . Cholecalciferol (VITAMIN D3) 125 MCG (5000 UT) CAPS Take 1 capsule by mouth daily.  Marland Kitchen COLCRYS 0.6 MG tablet Take 0.6 mg by mouth 2 (two) times daily as needed (for gout).   Marland Kitchen diclofenac sodium (VOLTAREN) 1 % GEL Apply 1 application topically 4 (four) times daily as needed (for pain).   . fexofenadine (ALLEGRA) 60 MG tablet Take 60 mg by mouth daily as needed. For allergies  . flecainide (TAMBOCOR) 50 MG tablet TAKE 1 TABLET BY MOUTH 2 TIMES DAILY FOR THE HEART.  Marland Kitchen irbesartan (AVAPRO) 150 MG tablet TAKE 1 TABLET BY MOUTH DAILY.  Marland Kitchen MAGNESIUM PO Take 4 capsules by mouth every evening.   . Menaquinone-7 (VITAMIN K2) 100 MCG CAPS Take by mouth daily.  . Misc Natural Products (LUTEIN 20) CAPS Take 40 mg by mouth 2 (two) times daily.   . NON FORMULARY CPAP  . rosuvastatin (CRESTOR) 5 MG tablet Patient takes 5 mg tablet by mouth 3 days weekly on mon, wed, fri  . TURMERIC PO Take 1 capsule by mouth 2 (two) times daily.   . vitamin B-12 (CYANOCOBALAMIN) 1000 MCG tablet Take 1,000 mcg by mouth daily.  Marland Kitchen warfarin (COUMADIN) 5 MG tablet Take 2 to 2.5 tablets by mouth once daily as directed by Coumadin Clinic     Allergies:   Clindamycin/lincomycin, Ciprofloxacin, and Amoxicillin   Social History   Tobacco Use  . Smoking status: Current Every Day Smoker    Packs/day: 0.25    Years: 50.00    Pack years: 12.50    Types: Cigarettes    Start date: 10/30/2013  . Smokeless tobacco: Former Systems developer    Types: Chew  . Tobacco comment: trying to quit 5 TO 6 CIGS PER DAY NOW  Vaping Use  . Vaping Use: Former  Substance Use Topics  . Alcohol use: Yes    Alcohol/week: 0.0 standard drinks    Comment: 2- glasses of red wine per night  .  Drug use: No     Family Hx: The patient's family history includes Breast cancer in his sister; Colon cancer in his father; Diabetes type II in his father and maternal grandfather; Heart disease in his mother; Leukemia in his paternal uncle; Prostate cancer in an other family member; Stroke in his father. There is no history of Pancreatic cancer.  Review of Systems  Respiratory: Negative for hemoptysis.  Gastrointestinal: Negative for hematochezia and melena.  Genitourinary: Negative for hematuria.     EKGs/Labs/Other Test Reviewed:    EKG:  EKG is   ordered today.  The ekg ordered today demonstrates sinus brady, HR 45, LAD, QTc 416 ms, non-specific ST-TW changes, no change  Recent Labs: 07/26/2019: ALT 14; BUN 26; Creatinine, Ser 1.07; Platelets 221; Potassium 4.6; Sodium 136; TSH 1.550 07/07/2020: Hemoglobin 14.1   Recent Lipid Panel Lab Results  Component Value Date/Time   CHOL 164 07/26/2019 11:37 AM   TRIG 163 (H) 07/26/2019 11:37 AM   HDL 43 07/26/2019 11:37 AM   CHOLHDL 3.8 07/26/2019 11:37 AM   LDLCALC 93 07/26/2019 11:37 AM   Labs obtained through [facility] - personally reviewed and interpreted: 01/07/2020: Total cholesterol 157, HDL 43, LDL 87, triglycerides 161, A1c 5.5 07/07/2020: Hgb 14.1 06/05/2020: Creatinine 1.04, K+ 4.9, ALT 14   Risk Assessment/Calculations:    CHA2DS2-VASc Score = 4  This indicates a 4.8% annual risk of stroke. The patient's score is based upon: CHF History: No HTN History: Yes Diabetes History: Yes Stroke History: No Vascular Disease History: No Age Score: 2 Gender Score: 0     Physical Exam:    VS:  BP 132/62   Pulse (!) 45   Ht 6\' 1"  (1.854 m)   Wt 220 lb 9.6 oz (100.1 kg)   SpO2 98%   BMI 29.10 kg/m     Wt Readings from Last 3 Encounters:  07/19/20 220 lb 9.6 oz (100.1 kg)  03/08/20 219 lb (99.3 kg)  11/29/19 218 lb 3.2 oz (99 kg)     Constitutional:      Appearance: Healthy appearance. Not in distress.  Neck:      Vascular: No carotid bruit. JVD normal.  Pulmonary:     Effort: Pulmonary effort is normal.     Breath sounds: No wheezing. No rales.  Cardiovascular:     Bradycardia present. Regular rhythm. Normal S1. Normal S2.     Murmurs: There is no murmur.  Edema:    Ankle: bilateral 1+ edema of the ankle. Abdominal:     Palpations: Abdomen is soft. There is no hepatomegaly.  Skin:    General: Skin is warm and dry.  Neurological:     General: No focal deficit present.     Mental Status: Alert and oriented to person, place and time.     Cranial Nerves: Cranial nerves are intact.          ASSESSMENT & PLAN:    1. Paroxysmal atrial fibrillation (HCC) Maintaining sinus rhythm.  He is tolerating anticoagulation.  Hemoglobin in 06/2020 was 14.1.  Creatinine was 1.04.  He remains on warfarin which is followed in our clinic.  He remains on flecainide.  Calcium channel blocker was stopped years ago due to bradycardia.  2. Bradycardia He is asymptomatic.  Monitor in 2021 did not demonstrate any significant pauses.  We discussed symptoms that would prompt earlier follow-up.  If he has any issues with near syncope or syncope, he will need repeat monitor.  3. Essential hypertension The patient's blood pressure is controlled on his current regimen.  Continue current therapy.   4. Hyperlipidemia, unspecified hyperlipidemia type Fair control.  He cannot tolerate higher doses of statin therapy.  Continue current dose of rosuvastatin.         Dispo:  Return in about 6 months (around 01/19/2021) for Routine Follow Up, w/ Richardson Dopp, PA-C.   Medication Adjustments/Labs and Tests Ordered: Current medicines  are reviewed at length with the patient today.  Concerns regarding medicines are outlined above.  Tests Ordered: Orders Placed This Encounter  Procedures  . EKG 12-Lead   Medication Changes: No orders of the defined types were placed in this encounter.   Signed, Richardson Dopp, PA-C  07/19/2020  5:17 PM    [facility] [address], [place], [place]  [address] Phone: [phone]; Fax: [phone]

## 2020-07-19 ENCOUNTER — Ambulatory Visit (INDEPENDENT_AMBULATORY_CARE_PROVIDER_SITE_OTHER): Payer: PPO | Admitting: *Deleted

## 2020-07-19 ENCOUNTER — Other Ambulatory Visit: Payer: Self-pay

## 2020-07-19 ENCOUNTER — Encounter: Payer: Self-pay | Admitting: Physician Assistant

## 2020-07-19 ENCOUNTER — Ambulatory Visit: Payer: PPO | Admitting: Physician Assistant

## 2020-07-19 VITALS — BP 132/62 | HR 45 | Ht 73.0 in | Wt 220.6 lb

## 2020-07-19 DIAGNOSIS — R001 Bradycardia, unspecified: Secondary | ICD-10-CM | POA: Diagnosis not present

## 2020-07-19 DIAGNOSIS — I10 Essential (primary) hypertension: Secondary | ICD-10-CM | POA: Diagnosis not present

## 2020-07-19 DIAGNOSIS — Z7901 Long term (current) use of anticoagulants: Secondary | ICD-10-CM

## 2020-07-19 DIAGNOSIS — I480 Paroxysmal atrial fibrillation: Secondary | ICD-10-CM | POA: Diagnosis not present

## 2020-07-19 DIAGNOSIS — E785 Hyperlipidemia, unspecified: Secondary | ICD-10-CM | POA: Diagnosis not present

## 2020-07-19 DIAGNOSIS — G4733 Obstructive sleep apnea (adult) (pediatric): Secondary | ICD-10-CM

## 2020-07-19 LAB — POCT INR: INR: 2.9 (ref 2.0–3.0)

## 2020-07-19 NOTE — Patient Instructions (Signed)
Description   Continue taking Warfarin 2.5 tablets daily except 2 tablets on Saturdays. Remain consistent with your greens.  Recheck INR in 4 weeks. Call if placed on any new medications [phone].

## 2020-07-19 NOTE — Patient Instructions (Signed)
Medication Instructions:  Your physician recommends that you continue on your current medications as directed. Please refer to the Current Medication list given to you today.  *If you need a refill on your cardiac medications before your next appointment, please call your pharmacy*   Lab Work: -NONE  If you have labs (blood work) drawn today and your tests are completely normal, you will receive your results only by: Marland Kitchen MyChart Message (if you have MyChart) OR . A paper copy in the mail If you have any lab test that is abnormal or we need to change your treatment, we will call you to review the results.   Testing/Procedures: -NONE   Follow-Up: At [facility], you and your health needs are our priority.  As part of our continuing mission to provide you with exceptional heart care, we have created designated Provider Care Teams.  These Care Teams include your primary Cardiologist (physician) and Advanced Practice Providers (APPs -  Physician Assistants and Nurse Practitioners) who all work together to provide you with the care you need, when you need it.  We recommend signing up for the patient portal called "MyChart".  Sign up information is provided on this After Visit Summary.  MyChart is used to connect with patients for Virtual Visits (Telemedicine).  Patients are able to view lab/test results, encounter notes, upcoming appointments, etc.  Non-urgent messages can be sent to your provider as well.   To learn more about what you can do with MyChart, go to [email or web address].    Your next appointment:   6 month(s)  The format for your next appointment:   In Person  Provider:   Richardson Dopp, PA-C   Other Instructions Your physician wants you to follow-up in: 6 months with Richardson Dopp, PA.  You will receive a reminder letter in the mail two months in advance. If you don't receive a letter, please call our office to schedule the follow-up appointment.

## 2020-07-24 DIAGNOSIS — Z6829 Body mass index (BMI) 29.0-29.9, adult: Secondary | ICD-10-CM | POA: Diagnosis not present

## 2020-07-24 DIAGNOSIS — M1A09X0 Idiopathic chronic gout, multiple sites, without tophus (tophi): Secondary | ICD-10-CM | POA: Diagnosis not present

## 2020-07-24 DIAGNOSIS — M150 Primary generalized (osteo)arthritis: Secondary | ICD-10-CM | POA: Diagnosis not present

## 2020-07-24 DIAGNOSIS — E663 Overweight: Secondary | ICD-10-CM | POA: Diagnosis not present

## 2020-08-16 ENCOUNTER — Other Ambulatory Visit: Payer: Self-pay

## 2020-08-16 ENCOUNTER — Ambulatory Visit (INDEPENDENT_AMBULATORY_CARE_PROVIDER_SITE_OTHER): Payer: PPO | Admitting: *Deleted

## 2020-08-16 DIAGNOSIS — Z5181 Encounter for therapeutic drug level monitoring: Secondary | ICD-10-CM

## 2020-08-16 DIAGNOSIS — I480 Paroxysmal atrial fibrillation: Secondary | ICD-10-CM | POA: Diagnosis not present

## 2020-08-16 LAB — POCT INR: INR: 3.1 — AB (ref 2.0–3.0)

## 2020-08-16 NOTE — Patient Instructions (Signed)
Description   Take 1.5 tablets of warfarin today and then continue taking Warfarin 2.5 tablets daily except 2 tablets on Saturdays. Remain consistent with your greens.  Recheck INR in 4 weeks. Call if placed on any new medications [phone].

## 2020-08-17 ENCOUNTER — Other Ambulatory Visit: Payer: Self-pay | Admitting: Cardiology

## 2020-09-13 ENCOUNTER — Other Ambulatory Visit: Payer: Self-pay

## 2020-09-13 ENCOUNTER — Ambulatory Visit (INDEPENDENT_AMBULATORY_CARE_PROVIDER_SITE_OTHER): Payer: PPO | Admitting: *Deleted

## 2020-09-13 DIAGNOSIS — Z5181 Encounter for therapeutic drug level monitoring: Secondary | ICD-10-CM

## 2020-09-13 DIAGNOSIS — I480 Paroxysmal atrial fibrillation: Secondary | ICD-10-CM | POA: Diagnosis not present

## 2020-09-13 LAB — POCT INR: INR: 2.6 (ref 2.0–3.0)

## 2020-09-13 NOTE — Patient Instructions (Signed)
Description   Continue taking Warfarin 2.5 tablets daily except 2 tablets on Saturdays. Remain consistent with your greens.  Recheck INR in 5 weeks. Call if placed on any new medications [phone].

## 2020-09-25 DIAGNOSIS — C61 Malignant neoplasm of prostate: Secondary | ICD-10-CM | POA: Diagnosis not present

## 2020-09-25 DIAGNOSIS — I10 Essential (primary) hypertension: Secondary | ICD-10-CM | POA: Diagnosis not present

## 2020-09-25 DIAGNOSIS — I482 Chronic atrial fibrillation, unspecified: Secondary | ICD-10-CM | POA: Diagnosis not present

## 2020-09-25 DIAGNOSIS — J4520 Mild intermittent asthma, uncomplicated: Secondary | ICD-10-CM | POA: Diagnosis not present

## 2020-09-25 DIAGNOSIS — E785 Hyperlipidemia, unspecified: Secondary | ICD-10-CM | POA: Diagnosis not present

## 2020-09-25 DIAGNOSIS — K219 Gastro-esophageal reflux disease without esophagitis: Secondary | ICD-10-CM | POA: Diagnosis not present

## 2020-09-25 DIAGNOSIS — F3341 Major depressive disorder, recurrent, in partial remission: Secondary | ICD-10-CM | POA: Diagnosis not present

## 2020-10-18 ENCOUNTER — Other Ambulatory Visit: Payer: Self-pay

## 2020-10-18 ENCOUNTER — Ambulatory Visit (INDEPENDENT_AMBULATORY_CARE_PROVIDER_SITE_OTHER): Payer: PPO

## 2020-10-18 DIAGNOSIS — I4891 Unspecified atrial fibrillation: Secondary | ICD-10-CM

## 2020-10-18 DIAGNOSIS — I480 Paroxysmal atrial fibrillation: Secondary | ICD-10-CM

## 2020-10-18 DIAGNOSIS — Z7901 Long term (current) use of anticoagulants: Secondary | ICD-10-CM

## 2020-10-18 LAB — POCT INR: INR: 2.9 (ref 2.0–3.0)

## 2020-10-18 NOTE — Patient Instructions (Signed)
Description   Continue taking Warfarin 2.5 tablets daily except 2 tablets on Saturdays. Remain consistent with your greens.  Recheck INR in 6 weeks. Call if placed on any new medications [phone].

## 2020-10-20 DIAGNOSIS — C61 Malignant neoplasm of prostate: Secondary | ICD-10-CM | POA: Diagnosis not present

## 2020-10-20 DIAGNOSIS — Z8546 Personal history of malignant neoplasm of prostate: Secondary | ICD-10-CM | POA: Diagnosis not present

## 2020-11-27 ENCOUNTER — Encounter: Payer: Self-pay | Admitting: Cardiology

## 2020-11-27 ENCOUNTER — Telehealth: Payer: Self-pay | Admitting: Cardiology

## 2020-11-27 ENCOUNTER — Other Ambulatory Visit: Payer: Self-pay

## 2020-11-27 ENCOUNTER — Ambulatory Visit: Payer: PPO | Admitting: Cardiology

## 2020-11-27 VITALS — BP 120/70 | HR 63 | Ht 73.0 in | Wt 220.0 lb

## 2020-11-27 DIAGNOSIS — I10 Essential (primary) hypertension: Secondary | ICD-10-CM

## 2020-11-27 DIAGNOSIS — I480 Paroxysmal atrial fibrillation: Secondary | ICD-10-CM | POA: Diagnosis not present

## 2020-11-27 NOTE — Telephone Encounter (Signed)
Spoke with pt who reports he has been having lightheadedness and fatigue.  Yesterday he felt his heart was bouncing all over the place.  Pt does take coumadin. Appt scheduled for today with Dr Marlou Porch at 4:30 pm.  Pt will c/b before then if further issues or concerns.

## 2020-11-27 NOTE — Patient Instructions (Signed)

## 2020-11-27 NOTE — Progress Notes (Signed)
Cardiology Office Note:    Date:  11/27/2020   ID:  Jonathan Vargas, DOB January 07, 1946, MRN [id number]  PCP:  Jonathan Cha, MD   [facility] Providers Cardiologist:  Jonathan Furbish, MD     Referring MD: Jonathan Vargas,*     History of Present Illness:    Jonathan Vargas is a 75 y.o. adult presenting for evaluation of atrial fibrillation. Jonathan Vargas called earlier complaining of lightheadedness and fatigue and mentioned his heart was bouncing all over the place yesterday.   He has a hx of atrial flutter, hypertension, hypercholesteremia and atrial fibrillation.  Today, he mentions that his heart feels normal but is still feeling fatigued. He was still feeling the irregular skips this morning.   He reports that a few days ago while watching tv,he had an episode of significant chest pain that was accompanied by lightheadedness.  He mentions he tries to exercise regularly at the [facility]. On Saturday after returning from the [facility], he mentions feeling very tired before going to bed and just as tired when he woke up the next morning. He thought he was dehydrated and drank enough water which brought moderate relief.  He denies chest pain, shortness of breath, palpitations, lightheadedness, headaches, syncope, LE edema, orthopnea, PND.   Past Medical History:  Diagnosis Date   Acid reflux    Arthritis    ASD (atrial septal defect)    Asthma with allergic rhinitis and status asthmaticus    NO RECENT FLARE UPS   Atrial fib/flutter, transient    Cancer ([place])    skin   Chronic anticoagulation    Depression    Dysrhythmia    ATRIAL FIB   ENT complaint    Jonathan Vargas   Erectile dysfunction    Fatty liver    FHx: allergies    Gout    , Possible, Right Knee x2 ; intermittent milder episodes of pain in hte first MTP - uric acid 7.7- no treatment so far.   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   Pneumonia    " walking PNA > 40 years ago"   Prostate cancer ([place]) DX 2018   Seasonal allergies    Sleep apnea    wears CPAP ( set at 4-11)   Trigger finger, left    , fourth finger    Past Surgical History:  Procedure Laterality Date   CARDIOVERSION N/A 05/29/2016   Procedure: CARDIOVERSION;  Surgeon: [name], MD;  Location: [place];  Service: Cardiovascular;  Laterality: N/A;   CATARACT EXTRACTION W/ INTRAOCULAR LENS  IMPLANT, BILATERAL  2016   COLONOSCOPY WITH PROPOFOL N/A 09/26/2016   Procedure: COLONOSCOPY WITH PROPOFOL;  Surgeon: Wilford Corner, MD;  Location: [place];  Service: Endoscopy;  Laterality: N/A;   CYSTOSCOPY  01/23/2018   Procedure: CYSTOSCOPY FLEXIBLE;  Surgeon: Franchot Gallo, MD;  [facility];  Service: Urology;;  no seeds found in bladder   ESOPHAGOGASTRODUODENOSCOPY (EGD) WITH PROPOFOL N/A 09/26/2016   Procedure: ESOPHAGOGASTRODUODENOSCOPY (EGD) WITH PROPOFOL;  Surgeon: Wilford Corner, MD;  Location: [facility];  Service: Endoscopy;  Laterality: N/A;   LIVER BIOPSY  MANY YRS AGO  NASAL SINUS SURGERY  1997   RADIOACTIVE SEED IMPLANT N/A 01/23/2018   Procedure: RADIOACTIVE SEED IMPLANT/BRACHYTHERAPY IMPLANT;  Surgeon: Franchot Gallo, MD;  [facility];  Service: Urology;  Laterality: N/A;   69 seeds implanted   SPACE OAR INSTILLATION N/A 01/23/2018   Procedure: SPACE OAR INSTILLATION;  Surgeon: Franchot Gallo, MD;  Location: [facility];  Service: Urology;  Laterality: N/A;    Current Medications: Current Meds  Medication Sig   albuterol (PROVENTIL HFA;VENTOLIN HFA) 108 (90 Base) MCG/ACT inhaler Inhale 2 puffs into the lungs every 6 (six) hours as needed for  wheezing or shortness of breath.   allopurinol (ZYLOPRIM) 300 MG tablet Take 300 mg by mouth every evening.    CALCIUM PO Take 200 mg by mouth in the morning, at noon, and at bedtime.    Cholecalciferol (VITAMIN D3) 125 MCG (5000 UT) CAPS Take 1 capsule by mouth daily.   COLCRYS 0.6 MG tablet Take 0.6 mg by mouth 2 (two) times daily as needed (for gout).    diclofenac sodium (VOLTAREN) 1 % GEL Apply 1 application topically 4 (four) times daily as needed (for pain).    fexofenadine (ALLEGRA) 60 MG tablet Take 60 mg by mouth daily as needed. For allergies   flecainide (TAMBOCOR) 50 MG tablet TAKE 1 TABLET BY MOUTH 2 TIMES DAILY FOR THE HEART.   irbesartan (AVAPRO) 150 MG tablet TAKE 1 TABLET BY MOUTH DAILY.   MAGNESIUM PO Take 4 capsules by mouth every evening.    Menaquinone-7 (VITAMIN K2) 100 MCG CAPS Take by mouth daily.   Misc Natural Products (LUTEIN 20) CAPS Take 40 mg by mouth 2 (two) times daily.    NON FORMULARY CPAP   rosuvastatin (CRESTOR) 5 MG tablet Patient takes 5 mg tablet by mouth 3 days weekly on mon, wed, fri   TURMERIC PO Take 1 capsule by mouth 2 (two) times daily.    vitamin B-12 (CYANOCOBALAMIN) 1000 MCG tablet Take 1,000 mcg by mouth daily.   warfarin (COUMADIN) 5 MG tablet Take 2 to 2.5 tablets by mouth once daily as directed by Coumadin Clinic     Allergies:   Clindamycin/lincomycin, Ciprofloxacin, and Amoxicillin   Social History   Socioeconomic History   Marital status: Divorced    Spouse name: Not on file   Number of children: Not on file   Years of education: Bachelors   Highest education level: Not on file  Occupational History   Occupation: retired  Tobacco Use   Smoking status: Every Day    Packs/day: 0.25    Years: 50.00    Pack years: 12.50    Types: Cigarettes    Start date: 10/30/2013   Smokeless tobacco: Former    Types: Chew   Tobacco comments:    trying to quit 5 TO 6 CIGS PER DAY NOW  Vaping Use   Vaping Use: Former  Substance and  Sexual Activity   Alcohol use: Yes    Alcohol/week: 0.0 standard drinks    Comment: 2- glasses of red wine per night   Drug use: No   Sexual activity: Not Currently  Other Topics Concern   Not on file  Social History Narrative   Pt lives in [place] alone.  Retired from [employer]. Right handed. Caffeine 3 cups    Social Determinants of Health   Financial Resource Strain: Not on file  Food Insecurity: Not on file  Transportation Needs: Not on file  Physical Activity: Not  on file  Stress: Not on file  Social Connections: Not on file     Family History: The patient's family history includes Breast cancer in his sister; Colon cancer in his father; Diabetes type II in his father and maternal grandfather; Heart disease in his mother; Leukemia in his paternal uncle; Prostate cancer in an other family member; Stroke in his father. There is no history of Pancreatic cancer.  ROS:   Please see the history of present illness.    (+) fatigue  EKGs/Labs/Other Studies Reviewed:    The following studies were reviewed today:  EKG:  11/27/2020: sinus rhythm 63 bpm with PACs   Long-Term Monitor 07/26/2019: Sinus rhythm with average HR 55 bpm, minimum 32 bpm during sleep (benign), peak 132 bpm No atrial fibrillation, no pauses Rare paroxysmal atrial tachycardia (7 beats duration) - asymptomatic Rare PVC's, PAC's - 2 episodes of chest discomfort in diary sinus rhythm with one isolated PVC - benign, rare bigeminy Brief idioventricular rhythm during sleep. No ventricular tachycardia   Recent Labs: 07/07/2020: Hemoglobin 14.1  Recent Lipid Panel    Component Value Date/Time   CHOL 164 07/26/2019 1137   TRIG 163 (H) 07/26/2019 1137   HDL 43 07/26/2019 1137   CHOLHDL 3.8 07/26/2019 1137   LDLCALC 93 07/26/2019 1137     Risk Assessment/Calculations:          Physical Exam:    VS:  BP 120/70 (BP Location: Left Arm, Patient Position: Sitting, Cuff Size: Normal)    Pulse 63   Ht '6\' 1"'$  (1.854 m)   Wt 220 lb (99.8 kg)   SpO2 96%   BMI 29.03 kg/m     Wt Readings from Last 3 Encounters:  11/27/20 220 lb (99.8 kg)  07/19/20 220 lb 9.6 oz (100.1 kg)  03/08/20 219 lb (99.3 kg)     GEN:  Well nourished, well developed in no acute distress HEENT: Normal NECK: No JVD; No carotid bruits LYMPHATICS: No lymphadenopathy CARDIAC: RRR, no murmurs, rubs, gallops RESPIRATORY:  Clear to auscultation without rales, wheezing or rhonchi  ABDOMEN: Soft, non-tender, non-distended MUSCULOSKELETAL:  No edema; No deformity  SKIN: Warm and dry NEUROLOGIC:  Alert and oriented x 3 PSYCHIATRIC:  Normal affect   ASSESSMENT:    No diagnosis found. PLAN:    In order of problems listed above:  Fatigue/palpitations - EKG today overall reassuring with sinus rhythm 63 with PACs.  2 PACs in succession.  He does describe sensations such as this at home.  He also had felt some fatigue after he felt really good on Saturday, worked out, cardio.  Overall doing well currently.  Reassurance has been given. -Prior stress test 2019 overall low risk with no ischemia. -Echocardiogram 2018 showed normal pump function. -Unsure why fatigue was taking place.  No signs of bleeding.  He is hydrated adequately. -Fatigue has been addressed in the past by Truitt Merle as well.  Continue to try for exercise.  Persistent bradycardia - Heart rate today 63.  Reassuring.  Paroxysmal atrial fibrillation - Remaining in sinus on flecainide 50 mg twice a day.  Premature atrial contractions - Could consider slight increase in flecainide to see if this helps.  Overall lets continue with the current medication.   Continue medication regimen  He has follow-up with Richardson Dopp in November.    Medication Adjustments/Labs and Tests Ordered: Current medicines are reviewed at length with the patient today.  Concerns regarding medicines are outlined above.  No orders of the defined  types were  placed in this encounter.  No orders of the defined types were placed in this encounter.   There are no Patient Instructions on file for this visit.    I,Zite Okoli,acting as a Education administrator for [employer], MD.,have documented all relevant documentation on the behalf of Jonathan Furbish, MD,as directed by  Jonathan Furbish, MD while in the presence of Jonathan Furbish, MD.   I, Jonathan Furbish, MD, have reviewed all documentation for this visit. The documentation on 11/27/20 for the exam, diagnosis, procedures, and orders are all accurate and complete.  Elveria Royals  11/27/2020 4:48 PM    [facility]

## 2020-11-27 NOTE — Telephone Encounter (Signed)
Patient c/o Palpitations:  High priority if patient c/o lightheadedness, shortness of breath, or chest pain  How long have you had palpitations/irregular HR/ Afib? Are you having the symptoms now? 12 years, yesterday it felt like atrial flutter  Are you currently experiencing lightheadedness, SOB or CP? Yes, lightheaded and some SOB  Do you have a history of afib (atrial fibrillation) or irregular heart rhythm? Yes, 12 years  Have you checked your BP or HR? (document readings if available): No BP check and HR is slow  Are you experiencing any other symptoms? Very tired and no energy    Per message from pt through pt schedules. Patient requesting to be seen today or tomorrow.

## 2020-11-29 ENCOUNTER — Other Ambulatory Visit: Payer: Self-pay

## 2020-11-29 ENCOUNTER — Ambulatory Visit (INDEPENDENT_AMBULATORY_CARE_PROVIDER_SITE_OTHER): Payer: PPO | Admitting: *Deleted

## 2020-11-29 DIAGNOSIS — I480 Paroxysmal atrial fibrillation: Secondary | ICD-10-CM

## 2020-11-29 DIAGNOSIS — Z7901 Long term (current) use of anticoagulants: Secondary | ICD-10-CM

## 2020-11-29 LAB — POCT INR: INR: 3 (ref 2.0–3.0)

## 2020-11-29 NOTE — Patient Instructions (Signed)
Description   Continue taking Warfarin 2.5 tablets daily except 2 tablets on Saturdays. Have a leafy veggie today and remain consistent with your greens. Recheck INR in 6 weeks. Call if placed on any new medications [phone].

## 2020-12-07 ENCOUNTER — Ambulatory Visit (INDEPENDENT_AMBULATORY_CARE_PROVIDER_SITE_OTHER): Payer: PPO | Admitting: Ophthalmology

## 2020-12-07 ENCOUNTER — Encounter (INDEPENDENT_AMBULATORY_CARE_PROVIDER_SITE_OTHER): Payer: Self-pay | Admitting: Ophthalmology

## 2020-12-07 ENCOUNTER — Other Ambulatory Visit: Payer: Self-pay

## 2020-12-07 DIAGNOSIS — H43813 Vitreous degeneration, bilateral: Secondary | ICD-10-CM | POA: Diagnosis not present

## 2020-12-07 DIAGNOSIS — H4423 Degenerative myopia, bilateral: Secondary | ICD-10-CM | POA: Diagnosis not present

## 2020-12-07 DIAGNOSIS — H353131 Nonexudative age-related macular degeneration, bilateral, early dry stage: Secondary | ICD-10-CM

## 2020-12-07 NOTE — Assessment & Plan Note (Signed)

## 2020-12-07 NOTE — Progress Notes (Signed)
12/07/2020     CHIEF COMPLAINT Patient presents for  Chief Complaint  Patient presents with   Retina Follow Up      HISTORY OF PRESENT ILLNESS: Jonathan Vargas is a 76 y.o. adult who presents to the clinic today for:   HPI     Retina Follow Up   Patient presents with  Other.  In both eyes.  This started 1 year ago.  Severity is mild.  Duration of 1 year.  Since onset it is stable.        Comments   1 year fu OU and OCT Pt states, "I noticed a new floater yesterday that comes and goes and is pretty small. About the size of the tip of a ball point pen. Other than that the vision is the same."       Last edited by Kendra Opitz, COA on 12/07/2020  2:01 PM.      Referring physician: Leeroy Cha, MD [address] [place],  [place] [address]  HISTORICAL INFORMATION:   Selected notes from the MEDICAL RECORD NUMBER       CURRENT MEDICATIONS: No current outpatient medications on file. (Ophthalmic Drugs)   No current facility-administered medications for this visit. (Ophthalmic Drugs)   Current Outpatient Medications (Other)  Medication Sig   albuterol (PROVENTIL HFA;VENTOLIN HFA) 108 (90 Base) MCG/ACT inhaler Inhale 2 puffs into the lungs every 6 (six) hours as needed for wheezing or shortness of breath.   allopurinol (ZYLOPRIM) 300 MG tablet Take 300 mg by mouth every evening.    CALCIUM PO Take 200 mg by mouth in the morning, at noon, and at bedtime.    Cholecalciferol (VITAMIN D3) 125 MCG (5000 UT) CAPS Take 1 capsule by mouth daily.   COLCRYS 0.6 MG tablet Take 0.6 mg by mouth 2 (two) times daily as needed (for gout).    diclofenac sodium (VOLTAREN) 1 % GEL Apply 1 application topically 4 (four) times daily as needed (for pain).    fexofenadine (ALLEGRA) 60 MG tablet Take 60 mg by mouth daily as needed. For allergies   flecainide (TAMBOCOR) 50 MG tablet TAKE 1 TABLET BY MOUTH 2 TIMES DAILY FOR THE HEART.   irbesartan (AVAPRO) 150 MG tablet  TAKE 1 TABLET BY MOUTH DAILY.   MAGNESIUM PO Take 4 capsules by mouth every evening.    Menaquinone-7 (VITAMIN K2) 100 MCG CAPS Take by mouth daily.   Misc Natural Products (LUTEIN 20) CAPS Take 40 mg by mouth 2 (two) times daily.    NON FORMULARY CPAP   rosuvastatin (CRESTOR) 5 MG tablet Patient takes 5 mg tablet by mouth 3 days weekly on mon, wed, fri   TURMERIC PO Take 1 capsule by mouth 2 (two) times daily.    vitamin B-12 (CYANOCOBALAMIN) 1000 MCG tablet Take 1,000 mcg by mouth daily.   warfarin (COUMADIN) 5 MG tablet Take 2 to 2.5 tablets by mouth once daily as directed by [facility]   No current facility-administered medications for this visit. 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 Erik Obey   Erectile dysfunction    Fatty liver    FHx: allergies    Gout    , Possible, Right Knee x2 ; intermittent milder episodes of pain in hte first MTP - uric acid 7.7- no treatment so far.   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   Pneumonia    " walking PNA > 40 years ago"   Prostate cancer ([place]) DX 2018   Seasonal allergies    Sleep apnea    wears CPAP ( set at  4-11)   Trigger finger, left    , fourth finger   Past Surgical History:  Procedure Laterality Date   CARDIOVERSION N/A 05/29/2016   Procedure: CARDIOVERSION;  Surgeon: [name], MD;  Location: [facility];  Service: Cardiovascular;  Laterality: N/A;   CATARACT EXTRACTION W/ INTRAOCULAR LENS  IMPLANT, BILATERAL  2016   COLONOSCOPY WITH PROPOFOL N/A 09/26/2016   Procedure: COLONOSCOPY WITH PROPOFOL;  Surgeon: Wilford Corner, MD;  Location: [facility];  Service: Endoscopy;  Laterality: N/A;   CYSTOSCOPY  01/23/2018   Procedure: CYSTOSCOPY FLEXIBLE;  Surgeon: Franchot Gallo, MD;  Location: [facility];  Service: Urology;;  no seeds found in bladder   ESOPHAGOGASTRODUODENOSCOPY (EGD) WITH PROPOFOL N/A 09/26/2016   Procedure: ESOPHAGOGASTRODUODENOSCOPY (EGD) WITH PROPOFOL;  Surgeon: Wilford Corner, MD;  Location: [facility];  Service: Endoscopy;  Laterality: N/A;   LIVER BIOPSY  MANY YRS AGO   NASAL SINUS SURGERY  1997   RADIOACTIVE SEED IMPLANT N/A 01/23/2018   Procedure: RADIOACTIVE SEED IMPLANT/BRACHYTHERAPY IMPLANT;  Surgeon: Franchot Gallo, MD;  Location: [facility];  Service: Urology;  Laterality: N/A;   69 seeds implanted   SPACE OAR INSTILLATION N/A 01/23/2018   Procedure: SPACE OAR INSTILLATION;  Surgeon: Franchot Gallo, MD;  Location: [facility];  Service: Urology;  Laterality: N/A;    FAMILY HISTORY Family History  Problem Relation Age of Onset   Stroke Father    Diabetes type II Father    Colon cancer Father    Heart disease Mother        with pacemaker   Breast cancer Sister    Diabetes type II Maternal Grandfather    Leukemia Paternal Uncle    Prostate cancer Other    Pancreatic cancer Neg Hx     SOCIAL HISTORY Social History   Tobacco Use   Smoking status: Every Day    Packs/day: 0.25    Years: 50.00    Pack years: 12.50    Types: Cigarettes    Start date: 10/30/2013   Smokeless tobacco:  Former    Types: Chew   Tobacco comments:    trying to quit 5 TO 6 CIGS PER DAY NOW  Vaping Use   Vaping Use: Former  Substance Use Topics   Alcohol use: Yes    Alcohol/week: 0.0 standard drinks  Comment: 2- glasses of red wine per night   Drug use: No         OPHTHALMIC EXAM:  Base Eye Exam     Visual Acuity (ETDRS)       Right Left   Dist [place] 20/25 +1    Dist cc  20/30 -1    Correction: Contacts         Tonometry (Tonopen, 2:09 PM)       Right Left   Pressure 11 11         Pupils       Pupils Dark Light Shape React APD   Right PERRL 4 3 Round Brisk None   Left PERRL 4 3 Round Brisk None         Visual Fields (Counting fingers)       Left Right    Full Full         Extraocular Movement       Right Left    Full Full         Neuro/Psych     Oriented x3: Yes   Mood/Affect: Normal         Dilation     Both eyes: 1.0% Mydriacyl, 2.5% Phenylephrine @ 2:09 PM           Slit Lamp and Fundus Exam     External Exam       Right Left   External Normal Normal         Slit Lamp Exam       Right Left   Lids/Lashes Normal Normal   Conjunctiva/Sclera White and quiet White and quiet   Cornea Clear Clear   Anterior Chamber Deep and quiet Deep and quiet   Iris Round and reactive Round and reactive   Lens Posterior chamber intraocular lens Posterior chamber intraocular lens   Anterior Vitreous Normal Normal         Fundus Exam       Right Left   Posterior Vitreous Posterior vitreous detachment Posterior vitreous detachment   Disc Peripapillary atrophy Peripapillary atrophy   C/D Ratio 0.55 0.55   Macula Geographic atrophy, Pigmented atrophy, myopic mac degen, no hemorrhage, no macular thickening Geographic atrophy, Pigmented atrophy   Vessels Normal Normal   Periphery CR scar at 9 o'clock no change, no retinal tear , no holes or tears            IMAGING AND PROCEDURES  Imaging and Procedures for 12/07/20  OCT,  Retina - OU - Both Eyes       Right Eye Quality was good. Scan locations included subfoveal. Progression has been stable. Findings include myopic contour.   Left Eye Quality was borderline. Scan locations included subfoveal. Progression has been stable. Findings include myopic contour.   Notes Geographic atrophy peripapillary, otherwise no active CNVM in either eye nor near the fovea             ASSESSMENT/PLAN:  Bilateral degenerative progressive high myopia The nature of age--related macular degeneration was discussed with the patient as well as the distinction between dry and wet types. Checking an Amsler Grid daily with advice to return immediately should a distortion develop, was given to the patient. The patient 's smoking status now and in the past was determined and advice based on the AREDS study was provided regarding the consumption of antioxidant supplements. AREDS 2 vitamin formulation was recommended. Consumption of dark leafy vegetables and fresh fruits of various colors was recommended. Treatment  modalities for wet macular degeneration particularly the use of intravitreal injections of anti-blood vessel growth factors was discussed with the patient. Avastin, Lucentis, and Eylea are the available options. On occasion, therapy includes the use of photodynamic therapy and thermal laser. Stressed to the patient do not rub eyes.  Patient was advised to check Amsler Grid daily and return immediately if changes are noted. Instructions on using the grid were given to the patient. All patient questions were answered.  Posterior vitreous detachment of both eyes  The nature of posterior vitreous detachment was discussed with the patient as well as its physiology, its age prevalence, and its possible implication regarding retinal breaks and detachment.  An informational brochure was offered to the patient.  All the patient's questions were answered.  The patient was asked to return if  new or different flashes or floaters develops.   Patient was instructed to contact office immediately if any new changes were noticed. I explained to the patient that vitreous inside the eye is similar to jello inside a bowl. As the jello melts it can start to pull away from the bowl, similarly the vitreous throughout our lives can begin to pull away from the retina. That process is called a posterior vitreous detachment. In some cases, the vitreous can tug hard enough on the retina to form a retinal tear. I discussed with the patient the signs and symptoms of a retinal detachment.  Do not rub the eye.    Early stage nonexudative age-related macular degeneration of both eyes The nature of age--related macular degeneration was discussed with the patient as well as the distinction between dry and wet types. Checking an Amsler Grid daily with advice to return immediately should a distortion develop, was given to the patient. The patient 's smoking status now and in the past was determined and advice based on the AREDS study was provided regarding the consumption of antioxidant supplements. AREDS 2 vitamin formulation was recommended. Consumption of dark leafy vegetables and fresh fruits of various colors was recommended. Treatment modalities for wet macular degeneration particularly the use of intravitreal injections of anti-blood vessel growth factors was discussed with the patient. Avastin, Lucentis, and Eylea are the available options. On occasion, therapy includes the use of photodynamic therapy and thermal laser. Stressed to the patient do not rub eyes.  Patient was advised to check Amsler Grid daily and return immediately if changes are noted. Instructions on using the grid were given to the patient. All patient questions were answered.      ICD-10-CM   1. Posterior vitreous detachment of both eyes  H43.813     2. Early stage nonexudative age-related macular degeneration of both eyes  H35.3131      3. Bilateral degenerative progressive high myopia  H44.23 OCT, Retina - OU - Both Eyes      1.  Bilateral no change in macular findings.  No signs of complications of degenerative high myopia nor ARMD.  No sign of CNVM.  Geographic atrophy likely progress with the aging process though excellent right now  2.  Bilateral PVD stable over time  3.  High myopia OU no holes or tears  Ophthalmic Meds Ordered this visit:  No orders of the defined types were placed in this encounter.      Return in about 1 year (around 12/07/2021) for COLOR FP, OCT, DILATE OU.  There are no Patient Instructions on file for this visit.   Explained the diagnoses, plan, and follow up with  the patient and they expressed understanding.  Patient expressed understanding of the importance of proper follow up care.   Clent Demark Rebekkah Powless M.D. Diseases & Surgery of the Retina and Vitreous Retina & Diabetic [place] 12/07/20     Abbreviations: M myopia (nearsighted); A astigmatism; H hyperopia (farsighted); P presbyopia; Mrx spectacle prescription;  CTL contact lenses; OD right eye; OS left eye; OU both eyes  XT exotropia; ET esotropia; PEK punctate epithelial keratitis; PEE punctate epithelial erosions; DES dry eye syndrome; MGD meibomian gland dysfunction; ATs artificial tears; PFAT's preservative free artificial tears; [place] nuclear sclerotic cataract; PSC posterior subcapsular cataract; ERM epi-retinal membrane; PVD posterior vitreous detachment; RD retinal detachment; DM diabetes mellitus; DR diabetic retinopathy; NPDR non-proliferative diabetic retinopathy; PDR proliferative diabetic retinopathy; CSME clinically significant macular edema; DME diabetic macular edema; dbh dot blot hemorrhages; CWS cotton wool spot; POAG primary open angle glaucoma; C/D cup-to-disc ratio; HVF humphrey visual field; GVF goldmann visual field; OCT optical coherence tomography; IOP intraocular pressure; BRVO Branch retinal vein occlusion;  CRVO central retinal vein occlusion; CRAO central retinal artery occlusion; BRAO branch retinal artery occlusion; RT retinal tear; SB scleral buckle; PPV pars plana vitrectomy; VH Vitreous hemorrhage; PRP panretinal laser photocoagulation; IVK intravitreal kenalog; VMT vitreomacular traction; MH Macular hole;  NVD neovascularization of the disc; NVE neovascularization elsewhere; AREDS age related eye disease study; ARMD age related macular degeneration; POAG primary open angle glaucoma; EBMD epithelial/anterior basement membrane dystrophy; ACIOL anterior chamber intraocular lens; IOL intraocular lens; PCIOL posterior chamber intraocular lens; Phaco/IOL phacoemulsification with intraocular lens placement; [place] photorefractive keratectomy; LASIK laser assisted in situ keratomileusis; HTN hypertension; DM diabetes mellitus; COPD chronic obstructive pulmonary disease

## 2020-12-07 NOTE — Assessment & Plan Note (Signed)

## 2020-12-28 ENCOUNTER — Other Ambulatory Visit: Payer: Self-pay | Admitting: Cardiology

## 2020-12-28 DIAGNOSIS — I480 Paroxysmal atrial fibrillation: Secondary | ICD-10-CM

## 2021-01-03 DIAGNOSIS — Z85828 Personal history of other malignant neoplasm of skin: Secondary | ICD-10-CM | POA: Diagnosis not present

## 2021-01-03 DIAGNOSIS — L821 Other seborrheic keratosis: Secondary | ICD-10-CM | POA: Diagnosis not present

## 2021-01-03 DIAGNOSIS — D1801 Hemangioma of skin and subcutaneous tissue: Secondary | ICD-10-CM | POA: Diagnosis not present

## 2021-01-03 DIAGNOSIS — M720 Palmar fascial fibromatosis [Dupuytren]: Secondary | ICD-10-CM | POA: Diagnosis not present

## 2021-01-03 DIAGNOSIS — L570 Actinic keratosis: Secondary | ICD-10-CM | POA: Diagnosis not present

## 2021-01-03 DIAGNOSIS — D1721 Benign lipomatous neoplasm of skin and subcutaneous tissue of right arm: Secondary | ICD-10-CM | POA: Diagnosis not present

## 2021-01-03 DIAGNOSIS — D2271 Melanocytic nevi of right lower limb, including hip: Secondary | ICD-10-CM | POA: Diagnosis not present

## 2021-01-03 DIAGNOSIS — L814 Other melanin hyperpigmentation: Secondary | ICD-10-CM | POA: Diagnosis not present

## 2021-01-15 DIAGNOSIS — Z800 Family history of malignant neoplasm of digestive organs: Secondary | ICD-10-CM | POA: Diagnosis not present

## 2021-01-15 DIAGNOSIS — N529 Male erectile dysfunction, unspecified: Secondary | ICD-10-CM | POA: Diagnosis not present

## 2021-01-15 DIAGNOSIS — J309 Allergic rhinitis, unspecified: Secondary | ICD-10-CM | POA: Diagnosis not present

## 2021-01-15 DIAGNOSIS — I77811 Abdominal aortic ectasia: Secondary | ICD-10-CM | POA: Diagnosis not present

## 2021-01-15 DIAGNOSIS — E785 Hyperlipidemia, unspecified: Secondary | ICD-10-CM | POA: Diagnosis not present

## 2021-01-15 DIAGNOSIS — Z23 Encounter for immunization: Secondary | ICD-10-CM | POA: Diagnosis not present

## 2021-01-15 DIAGNOSIS — I10 Essential (primary) hypertension: Secondary | ICD-10-CM | POA: Diagnosis not present

## 2021-01-15 DIAGNOSIS — Z7901 Long term (current) use of anticoagulants: Secondary | ICD-10-CM | POA: Diagnosis not present

## 2021-01-15 DIAGNOSIS — R7309 Other abnormal glucose: Secondary | ICD-10-CM | POA: Diagnosis not present

## 2021-01-15 DIAGNOSIS — I480 Paroxysmal atrial fibrillation: Secondary | ICD-10-CM | POA: Diagnosis not present

## 2021-01-15 DIAGNOSIS — Z0000 Encounter for general adult medical examination without abnormal findings: Secondary | ICD-10-CM | POA: Diagnosis not present

## 2021-01-15 DIAGNOSIS — J4520 Mild intermittent asthma, uncomplicated: Secondary | ICD-10-CM | POA: Diagnosis not present

## 2021-01-15 DIAGNOSIS — M109 Gout, unspecified: Secondary | ICD-10-CM | POA: Diagnosis not present

## 2021-01-15 DIAGNOSIS — F3341 Major depressive disorder, recurrent, in partial remission: Secondary | ICD-10-CM | POA: Diagnosis not present

## 2021-01-15 DIAGNOSIS — K219 Gastro-esophageal reflux disease without esophagitis: Secondary | ICD-10-CM | POA: Diagnosis not present

## 2021-01-16 ENCOUNTER — Other Ambulatory Visit: Payer: Self-pay

## 2021-01-16 ENCOUNTER — Ambulatory Visit (INDEPENDENT_AMBULATORY_CARE_PROVIDER_SITE_OTHER): Payer: PPO | Admitting: *Deleted

## 2021-01-16 ENCOUNTER — Ambulatory Visit: Payer: PPO | Admitting: Physician Assistant

## 2021-01-16 ENCOUNTER — Encounter: Payer: Self-pay | Admitting: Physician Assistant

## 2021-01-16 VITALS — BP 140/70 | HR 53 | Ht 73.0 in | Wt 224.6 lb

## 2021-01-16 DIAGNOSIS — E7800 Pure hypercholesterolemia, unspecified: Secondary | ICD-10-CM

## 2021-01-16 DIAGNOSIS — R600 Localized edema: Secondary | ICD-10-CM | POA: Diagnosis not present

## 2021-01-16 DIAGNOSIS — I480 Paroxysmal atrial fibrillation: Secondary | ICD-10-CM | POA: Diagnosis not present

## 2021-01-16 DIAGNOSIS — I10 Essential (primary) hypertension: Secondary | ICD-10-CM

## 2021-01-16 DIAGNOSIS — R001 Bradycardia, unspecified: Secondary | ICD-10-CM | POA: Diagnosis not present

## 2021-01-16 DIAGNOSIS — I491 Atrial premature depolarization: Secondary | ICD-10-CM | POA: Diagnosis not present

## 2021-01-16 DIAGNOSIS — Z7901 Long term (current) use of anticoagulants: Secondary | ICD-10-CM

## 2021-01-16 LAB — POCT INR: INR: 2.8 (ref 2.0–3.0)

## 2021-01-16 MED ORDER — FUROSEMIDE 20 MG PO TABS: 20.0000 mg | ORAL_TABLET | Freq: Every day | ORAL | 1 refills | Status: DC

## 2021-01-16 NOTE — Assessment & Plan Note (Signed)
Edema seems to mainly be due to venous insufficiency exacerbated by high salt load.  No clinical symptoms or signs of CHF.  He can usually manage the edema on his own with pushing fluids.  I will give him furosemide 20 mg to take as needed for edema that persists.  He knows to contact me if he cannot get the edema to resolve or if he develops shortness of breath assoc with his edema.

## 2021-01-16 NOTE — Progress Notes (Signed)
Cardiology Office Note:    Date:  01/16/2021   ID:  Jonathan Vargas, DOB 07-06-1945, MRN [id number]  PCP:  Jonathan Cha, MD   [facility] Providers Cardiologist:  Jonathan Furbish, MD Cardiology APP:  Jonathan Vargas    Referring MD: Jonathan Vargas,*   Chief Complaint:  Follow-up for atrial fibrillation    Patient Profile:   Jonathan Vargas is a 75 y.o. 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 was last seen in September 2022 by Jonathan Vargas with symptoms of fatigue and palpitations.  EKG demonstrated sinus rhythm.  No further work-up was recommended.  Jonathan Vargas did mention that flecainide could be increased if he continues to have symptomatic PACs.  He returns for follow-up.  He is here alone.  Overall, he has been doing well without chest pain, shortness of breath, syncope, orthopnea.  He does have occasional leg edema.  This seems to be worse when he eats a high salt load.  He can usually push fluids and resolve the edema on his own.      ASSESSMENT & PLAN:   Leg edema Edema seems to mainly be due to venous insufficiency exacerbated by high salt load.  No clinical symptoms or signs of CHF.  He can usually manage the edema on his own with pushing fluids.  I will give him furosemide 20 mg to take as needed for edema that persists.  He knows to contact me if he cannot get the edema to resolve or if he develops shortness of breath assoc with his edema.    Essential hypertension, benign Repeat blood pressure by me is  134/76.  Fair control.  Continue irbesartan 150 mg daily.  Atrial fibrillation Maintaining sinus rhythm on low-dose flecainide.  He is tolerating anticoagulation well with warfarin.  He is not on rate controlling medications due to bradycardia.  Continue flecainide 50 mg twice daily. Lab Results  Component Value Date   INR 2.8 01/16/2021   INR 3.0 11/29/2020   INR 2.9 10/18/2020     Premature atrial contractions No further symptoms.  No PACs on electrocardiogram today.  As noted, he has not on beta-blocker or calcium channel blocker due to bradycardia.  Pure hypercholesterolemia Managed by primary care.  He remains on rosuvastatin.  Bradycardia Asymptomatic.  Avoid AV nodal blocking agents.          Dispo:  Return in about 6 months (around 07/16/2021) for Routine Follow Up, w/ Jonathan Dopp, PA-C.    Prior CV studies: ZIO monitor 07/2019 Sinus rhythm, average heart rate 55 No atrial fibrillation Rare paroxysmal atrial tachycardia Rare PVCs, PACs Brief idioventricular rhythm during sleep  Myoview 01/07/2018 EF 55, normal perfusion; low risk  Echocardiogram 06/12/2017 Mild LVH, EF 55-60, normal wall motion, moderate LAE     Past Medical History:  Diagnosis Date   Acid reflux    Arthritis    ASD (atrial septal defect)    Asthma with allergic rhinitis and status asthmaticus    NO RECENT FLARE UPS   Atrial fib/flutter, transient    Cancer ([place])  skin   Chronic anticoagulation    Depression    Dysrhythmia    ATRIAL FIB   ENT complaint    Jonathan Vargas   Erectile dysfunction    Fatty liver    FHx: allergies    Gout    , Possible, Right Knee x2 ; intermittent milder episodes of pain in hte first MTP - uric acid 7.7- no treatment so far.   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   Pneumonia    " walking PNA > 40 years ago"   Prostate cancer ([place]) DX 2018   Seasonal allergies    Sleep apnea    wears CPAP ( set at 4-11)   Trigger finger, left    , fourth finger   Current Medications: Current Meds  Medication Sig   albuterol (PROVENTIL HFA;VENTOLIN HFA) 108 (90 Base) MCG/ACT inhaler Inhale 2 puffs into the lungs every 6 (six) hours as needed for wheezing or shortness of breath.   allopurinol (ZYLOPRIM) 300 MG tablet Take 300 mg by mouth every evening.    CALCIUM PO Take 200 mg by mouth in the morning, at noon, and at bedtime.    Cholecalciferol (VITAMIN D3) 125 MCG (5000 UT) CAPS Take 1 capsule by mouth daily.   COLCRYS 0.6 MG tablet Take 0.6 mg by mouth 2 (two) times daily as needed (for gout).    diclofenac sodium (VOLTAREN) 1 % GEL Apply 1 application topically 4 (four) times daily as needed (for pain).    fexofenadine (ALLEGRA) 60 MG tablet Take 60 mg by mouth daily as needed. For allergies   flecainide (TAMBOCOR) 50 MG tablet TAKE 1 TABLET BY MOUTH 2 TIMES DAILY FOR THE HEART.   furosemide (LASIX) 20 MG tablet Take 1 tablet (20 mg total) by mouth daily.   irbesartan (AVAPRO) 150 MG tablet TAKE 1 TABLET BY MOUTH DAILY.   MAGNESIUM PO Take 4 capsules by mouth every evening.    Menaquinone-7 (VITAMIN K2) 100 MCG CAPS Take by mouth daily.   Misc Natural Products (LUTEIN 20) CAPS Take 40 mg by mouth 2 (two) times daily.    NON FORMULARY CPAP   rosuvastatin (CRESTOR) 5 MG tablet Patient takes 5 mg tablet by mouth 3 days weekly on mon, wed, fri   TURMERIC PO Take 1 capsule by mouth 2 (two) times daily.    vitamin B-12 (CYANOCOBALAMIN) 1000 MCG tablet Take 1,000 mcg by mouth daily.   warfarin (COUMADIN) 5 MG tablet TAKE 2 TO 2.5 TABLETS BY MOUTH ONCE DAILY AS DIRECTED BY COUMADIN CLINIC    Allergies:   Clindamycin/lincomycin, Ciprofloxacin, and Amoxicillin   Social History    Tobacco Use   Smoking status: Every Day    Packs/day: 0.25    Years: 50.00    Pack years: 12.50    Types: Cigarettes    Start date: 10/30/2013   Smokeless tobacco: Former    Types: Chew   Tobacco comments:    trying to quit 5 TO 6 CIGS PER DAY NOW  Vaping Use   Vaping Use: Former  Substance Use Topics   Alcohol use: Yes    Alcohol/week: 0.0 standard drinks  Comment: 2- glasses of red wine per night   Drug use: No    Family Hx: The patient's family history includes Breast cancer in his sister; Colon cancer in his father; Diabetes type II in his father and maternal grandfather; Heart disease in his mother; Leukemia in his paternal uncle; Prostate cancer in an other family member; Stroke in his father. There is no history of Pancreatic cancer.  Review of Systems  Gastrointestinal:  Negative for hematochezia.  Genitourinary:  Negative for hematuria.    EKGs/Labs/Other Test Reviewed:    EKG:  EKG is  ordered today.  The ekg ordered today demonstrates sinus bradycardia, HR 53, left axis deviation, PR 154, QRS 108, QTc 399, nonspecific ST-T wave changes  Recent Labs: 07/07/2020: Hemoglobin 14.1   Recent Lipid Panel Lab Results  Component Value Date/Time   CHOL 164 07/26/2019 11:37 AM   TRIG 163 (H) 07/26/2019 11:37 AM   HDL 43 07/26/2019 11:37 AM   LDLCALC 93 07/26/2019 11:37 AM     Risk Assessment/Calculations:    CHA2DS2-VASc Score = 4   This indicates a 4.8% annual risk of stroke. The patient's score is based upon: CHF History: 0 HTN History: 1 Diabetes History: 1 Stroke History: 0 Vascular Disease History: 0 Age Score: 2 Gender Score: 0         Physical Exam:    VS:  BP 140/70   Pulse (!) 53   Ht 6\' 1"  (1.854 m)   Wt 224 lb 9.6 oz (101.9 kg)   PF 97 L/min   BMI 29.63 kg/m     Wt Readings from Last 3 Encounters:  01/16/21 224 lb 9.6 oz (101.9 kg)  11/27/20 220 lb (99.8 kg)  07/19/20 220 lb 9.6 oz (100.1 kg)    Constitutional:      Appearance:  Healthy appearance. Not in distress.  Neck:     Vascular: JVD normal.  Pulmonary:     Effort: Pulmonary effort is normal.     Breath sounds: No wheezing. No rales.  Cardiovascular:     Normal rate. Regular rhythm. Normal S1. Normal S2.      Murmurs: There is no murmur.  Edema:    Peripheral edema present.    Ankle: bilateral trace edema of the ankle. Abdominal:     Palpations: Abdomen is soft.  Skin:    General: Skin is warm and dry.  Neurological:     General: No focal deficit present.     Mental Status: Alert and oriented to person, place and time.     Cranial Nerves: Cranial nerves are intact.      Medication Adjustments/Labs and Tests Ordered: Current medicines are reviewed at length with the patient today.  Concerns regarding medicines are outlined above.  Tests Ordered: Orders Placed This Encounter  Procedures   EKG 12-Lead   Medication Changes: Meds ordered this encounter  Medications   furosemide (LASIX) 20 MG tablet    Sig: Take 1 tablet (20 mg total) by mouth daily.    Dispense:  10 tablet    Refill:  1   Signed, Jonathan Dopp, PA-C  01/16/2021 2:31 PM    [facility] [address], [place], [place]  [address] Phone: [phone]; Fax: [phone]

## 2021-01-16 NOTE — Patient Instructions (Signed)
Medication Instructions:   TAKE Lasix one tablet  by mouth ( 20 mg) daily as needed for swelling.    *If you need a refill on your cardiac medications before your next appointment, please call your pharmacy*   Lab Work:  -NONE  If you have labs (blood work) drawn today and your tests are completely normal, you will receive your results only by: [place] (if you have MyChart) OR A paper copy in the mail If you have any lab test that is abnormal or we need to change your treatment, we will call you to review the results.   Testing/Procedures:  -NONE   Follow-Up: At [facility], you and your health needs are our priority.  As part of our continuing mission to provide you with exceptional heart care, we have created designated Provider Care Teams.  These Care Teams include your primary Cardiologist (physician) and Advanced Practice Providers (APPs -  Physician Assistants and Nurse Practitioners) who all work together to provide you with the care you need, when you need it.  We recommend signing up for the patient portal called "MyChart".  Sign up information is provided on this After Visit Summary.  MyChart is used to connect with patients for Virtual Visits (Telemedicine).  Patients are able to view lab/test results, encounter notes, upcoming appointments, etc.  Non-urgent messages can be sent to your provider as well.   To learn more about what you can do with MyChart, go to [email or web address].    Your next appointment:   6 month(s)  The format for your next appointment:   In Person  Provider:   Richardson Dopp, PA-C   Other Instructions  Your physician wants you to follow-up in: 6 months with Richardson Dopp, PA-C.  You will receive a reminder letter in the mail two months in advance. If you don't receive a letter, please call our office to schedule the follow-up appointment.

## 2021-01-16 NOTE — Assessment & Plan Note (Signed)
Managed by primary care.  He remains on rosuvastatin.

## 2021-01-16 NOTE — Assessment & Plan Note (Signed)
No further symptoms.  No PACs on electrocardiogram today.  As noted, he has not on beta-blocker or calcium channel blocker due to bradycardia.

## 2021-01-16 NOTE — Assessment & Plan Note (Signed)
Asymptomatic.  Avoid AV nodal blocking agents.

## 2021-01-16 NOTE — Assessment & Plan Note (Signed)
Repeat blood pressure by me is 134/76.  Fair control.  Continue irbesartan 150 mg daily.

## 2021-01-16 NOTE — Assessment & Plan Note (Signed)
Maintaining sinus rhythm on low-dose flecainide.  He is tolerating anticoagulation well with warfarin.  He is not on rate controlling medications due to bradycardia.  Continue flecainide 50 mg twice daily. Lab Results  Component Value Date   INR 2.8 01/16/2021   INR 3.0 11/29/2020   INR 2.9 10/18/2020

## 2021-01-16 NOTE — Patient Instructions (Signed)
Description   Continue taking Warfarin 2.5 tablets daily except 2 tablets on Saturdays. Have a leafy veggie today and remain consistent with your greens. Recheck INR in 6 weeks. Call if placed on any new medications [phone].

## 2021-01-24 DIAGNOSIS — Z79899 Other long term (current) drug therapy: Secondary | ICD-10-CM | POA: Diagnosis not present

## 2021-01-24 DIAGNOSIS — E669 Obesity, unspecified: Secondary | ICD-10-CM | POA: Diagnosis not present

## 2021-01-24 DIAGNOSIS — Z6830 Body mass index (BMI) 30.0-30.9, adult: Secondary | ICD-10-CM | POA: Diagnosis not present

## 2021-01-24 DIAGNOSIS — M150 Primary generalized (osteo)arthritis: Secondary | ICD-10-CM | POA: Diagnosis not present

## 2021-01-24 DIAGNOSIS — M1A09X0 Idiopathic chronic gout, multiple sites, without tophus (tophi): Secondary | ICD-10-CM | POA: Diagnosis not present

## 2021-02-12 ENCOUNTER — Ambulatory Visit (HOSPITAL_COMMUNITY)
Admission: RE | Admit: 2021-02-12 | Discharge: 2021-02-12 | Disposition: A | Discharge status: Home / Self Care | Payer: PPO | Source: Ambulatory Visit | Attending: Internal Medicine | Admitting: Internal Medicine

## 2021-02-12 ENCOUNTER — Other Ambulatory Visit: Payer: Self-pay

## 2021-02-12 LAB — POCT HEMOGLOBIN-HEMACUE: Hemoglobin: 13.7 g/dL (ref 13.0–17.0)

## 2021-02-12 NOTE — Progress Notes (Addendum)
Pt here for therapeutic phlebotomy. Pt requested to stop at 400 ml. Pt tolerated well, see note under vital signs. Ambulated to bathroom at this time (1342) without difficulty. DC home at 1345 with no complaints. Patient delayed due to not having orders, verbal orders taken from Adriana Simas, [place] for Dr. Fara Olden.

## 2021-02-16 DIAGNOSIS — J4520 Mild intermittent asthma, uncomplicated: Secondary | ICD-10-CM | POA: Diagnosis not present

## 2021-02-16 DIAGNOSIS — I10 Essential (primary) hypertension: Secondary | ICD-10-CM | POA: Diagnosis not present

## 2021-02-16 DIAGNOSIS — K219 Gastro-esophageal reflux disease without esophagitis: Secondary | ICD-10-CM | POA: Diagnosis not present

## 2021-02-16 DIAGNOSIS — C61 Malignant neoplasm of prostate: Secondary | ICD-10-CM | POA: Diagnosis not present

## 2021-02-16 DIAGNOSIS — F3341 Major depressive disorder, recurrent, in partial remission: Secondary | ICD-10-CM | POA: Diagnosis not present

## 2021-02-16 DIAGNOSIS — E785 Hyperlipidemia, unspecified: Secondary | ICD-10-CM | POA: Diagnosis not present

## 2021-02-16 DIAGNOSIS — I482 Chronic atrial fibrillation, unspecified: Secondary | ICD-10-CM | POA: Diagnosis not present

## 2021-02-16 DIAGNOSIS — I480 Paroxysmal atrial fibrillation: Secondary | ICD-10-CM | POA: Diagnosis not present

## 2021-02-26 DIAGNOSIS — G4733 Obstructive sleep apnea (adult) (pediatric): Secondary | ICD-10-CM | POA: Diagnosis not present

## 2021-02-28 ENCOUNTER — Ambulatory Visit (INDEPENDENT_AMBULATORY_CARE_PROVIDER_SITE_OTHER): Payer: PPO | Admitting: *Deleted

## 2021-02-28 ENCOUNTER — Other Ambulatory Visit: Payer: Self-pay

## 2021-02-28 DIAGNOSIS — I480 Paroxysmal atrial fibrillation: Secondary | ICD-10-CM | POA: Diagnosis not present

## 2021-02-28 DIAGNOSIS — Z7901 Long term (current) use of anticoagulants: Secondary | ICD-10-CM | POA: Diagnosis not present

## 2021-02-28 LAB — POCT INR: INR: 3.4 — AB (ref 2.0–3.0)

## 2021-02-28 NOTE — Patient Instructions (Signed)
Description   Do not take any Warfarin today then continue taking Warfarin 2.5 tablets daily except 2 tablets on Saturdays. Have a leafy veggie today and remain consistent with your greens. Recheck INR in 5 weeks. Call if placed on any new medications [phone].

## 2021-03-22 DIAGNOSIS — H31013 Macula scars of posterior pole (postinflammatory) (post-traumatic), bilateral: Secondary | ICD-10-CM | POA: Diagnosis not present

## 2021-03-22 DIAGNOSIS — H524 Presbyopia: Secondary | ICD-10-CM | POA: Diagnosis not present

## 2021-03-22 DIAGNOSIS — H35033 Hypertensive retinopathy, bilateral: Secondary | ICD-10-CM | POA: Diagnosis not present

## 2021-03-22 DIAGNOSIS — H40013 Open angle with borderline findings, low risk, bilateral: Secondary | ICD-10-CM | POA: Diagnosis not present

## 2021-03-22 DIAGNOSIS — H353132 Nonexudative age-related macular degeneration, bilateral, intermediate dry stage: Secondary | ICD-10-CM | POA: Diagnosis not present

## 2021-03-30 DIAGNOSIS — I77811 Abdominal aortic ectasia: Secondary | ICD-10-CM | POA: Diagnosis not present

## 2021-04-03 DIAGNOSIS — H9202 Otalgia, left ear: Secondary | ICD-10-CM | POA: Diagnosis not present

## 2021-04-03 DIAGNOSIS — J0100 Acute maxillary sinusitis, unspecified: Secondary | ICD-10-CM | POA: Diagnosis not present

## 2021-04-03 DIAGNOSIS — Z03818 Encounter for observation for suspected exposure to other biological agents ruled out: Secondary | ICD-10-CM | POA: Diagnosis not present

## 2021-04-03 DIAGNOSIS — R5381 Other malaise: Secondary | ICD-10-CM | POA: Diagnosis not present

## 2021-04-04 ENCOUNTER — Other Ambulatory Visit: Payer: Self-pay

## 2021-04-04 ENCOUNTER — Ambulatory Visit (INDEPENDENT_AMBULATORY_CARE_PROVIDER_SITE_OTHER): Payer: PPO | Admitting: *Deleted

## 2021-04-04 DIAGNOSIS — I480 Paroxysmal atrial fibrillation: Secondary | ICD-10-CM

## 2021-04-04 DIAGNOSIS — Z7901 Long term (current) use of anticoagulants: Secondary | ICD-10-CM

## 2021-04-04 LAB — POCT INR: INR: 2.5 (ref 2.0–3.0)

## 2021-04-04 NOTE — Patient Instructions (Signed)
Description   Continue taking Warfarin 2.5 tablets daily except 2 tablets on Saturdays. Have a leafy veggie today and remain consistent with your greens. Recheck INR on 1/23 while on doxy (Pt usually 5 weeks) Call if placed on any new medications [phone].

## 2021-04-09 ENCOUNTER — Ambulatory Visit (INDEPENDENT_AMBULATORY_CARE_PROVIDER_SITE_OTHER): Payer: PPO | Admitting: *Deleted

## 2021-04-09 ENCOUNTER — Other Ambulatory Visit: Payer: Self-pay

## 2021-04-09 DIAGNOSIS — Z5181 Encounter for therapeutic drug level monitoring: Secondary | ICD-10-CM

## 2021-04-09 DIAGNOSIS — Z7901 Long term (current) use of anticoagulants: Secondary | ICD-10-CM

## 2021-04-09 DIAGNOSIS — I480 Paroxysmal atrial fibrillation: Secondary | ICD-10-CM | POA: Diagnosis not present

## 2021-04-09 LAB — POCT INR: INR: 2.5 (ref 2.0–3.0)

## 2021-04-09 NOTE — Patient Instructions (Signed)
Description   Continue taking Warfarin 2.5 tablets daily except 2 tablets on Saturdays. Recheck INR in 5 weeks. Call if placed on any new medications [phone].

## 2021-04-23 DIAGNOSIS — Z03818 Encounter for observation for suspected exposure to other biological agents ruled out: Secondary | ICD-10-CM | POA: Diagnosis not present

## 2021-04-23 DIAGNOSIS — R5383 Other fatigue: Secondary | ICD-10-CM | POA: Diagnosis not present

## 2021-04-23 DIAGNOSIS — R0981 Nasal congestion: Secondary | ICD-10-CM | POA: Diagnosis not present

## 2021-04-25 DIAGNOSIS — G4733 Obstructive sleep apnea (adult) (pediatric): Secondary | ICD-10-CM | POA: Diagnosis not present

## 2021-04-30 ENCOUNTER — Ambulatory Visit: Payer: PPO | Admitting: Physician Assistant

## 2021-04-30 ENCOUNTER — Other Ambulatory Visit: Payer: Self-pay

## 2021-04-30 ENCOUNTER — Ambulatory Visit (INDEPENDENT_AMBULATORY_CARE_PROVIDER_SITE_OTHER): Payer: PPO

## 2021-04-30 ENCOUNTER — Encounter: Payer: Self-pay | Admitting: Physician Assistant

## 2021-04-30 VITALS — Ht 73.0 in | Wt 212.0 lb

## 2021-04-30 DIAGNOSIS — G8929 Other chronic pain: Secondary | ICD-10-CM | POA: Diagnosis not present

## 2021-04-30 DIAGNOSIS — J0100 Acute maxillary sinusitis, unspecified: Secondary | ICD-10-CM | POA: Diagnosis not present

## 2021-04-30 DIAGNOSIS — M545 Low back pain, unspecified: Secondary | ICD-10-CM

## 2021-04-30 DIAGNOSIS — J321 Chronic frontal sinusitis: Secondary | ICD-10-CM | POA: Diagnosis not present

## 2021-04-30 DIAGNOSIS — Z9889 Other specified postprocedural states: Secondary | ICD-10-CM | POA: Diagnosis not present

## 2021-04-30 NOTE — Progress Notes (Signed)
Office Visit Note   Patient: Jonathan Vargas           Date of Birth: Oct 27, 1945           MRN: [id number] Visit Date: 04/30/2021              Requested by: Leeroy Cha, MD [address],  [place] [id number] PCP: Leeroy Cha, MD  Chief Complaint  Patient presents with   Lower Back - Pain      HPI: Patient is a pleasant active 76 year old gentleman with a 2-1/32-month history of lower back pain that radiates with numbness especially into his left leg.  He relates this to an injury sustained at the gym.  He was on a squat machine and coming down in the break that did not engage and he suddenly into a flexed position and felt something stretch.  Since then he has had episodes of his left leg going numb and feeling like it would give way.  He sometimes has had those symptoms on the right.  He denies any loss of bowel or bladder control.  He denies any hip pain.  He has treated this with chiropractic care which has not improved it.  He has tried rest and therapeutic exercises with his trainer which has not improved it.  He is concerned because its been going on for over 2-1/2 months and if anything is getting worse.  He also is afraid of his leg going out on him.  Assessment & Plan: Visit Diagnoses:  1. Chronic midline low back pain, unspecified whether sciatica present     Plan: His x-rays do not show any acute fractures or listhesis.  He does have some arthritic changes.  Because of the weakness in his leg which is getting worse and the numbness.  He is try conservative treatment treatment.  I recommend an MRI to see if a epidural steroid injection might be helpful.  He is in agreement with this plan.  We will follow-up once this is completed  Follow-Up Instructions: No follow-ups on file.   Ortho Exam  Patient is alert, oriented, no adenopathy, well-dressed, normal affect, normal respiratory effort. Examination of his lower back no tenderness to  palpation no step-off no abnormalities.  Points to some point of area in his posterior buttock that can produce the numbness in his left leg.  His strength today is 5 out of 5 with plantarflexion dorsiflexion of his ankles extension flexion of his knees and hips.  He has no pain and good motion with manipulation of his hip.  Cannot reproduce pain with forward flexion extension or side to side bending  Imaging: No results found. No images are attached to the encounter.  Labs: No results found for: HGBA1C, ESRSEDRATE, CRP, LABURIC, REPTSTATUS, GRAMSTAIN, CULT, LABORGA   Lab Results  Component Value Date   ALBUMIN 4.6 07/26/2019   ALBUMIN 3.8 01/16/2018    No results found for: MG No results found for: VD25OH  No results found for: PREALBUMIN CBC EXTENDED Latest Ref Rng & Units 02/12/2021 07/07/2020 12/02/2019  WBC 3.4 - 10.8 x10E3/uL - - -  RBC 4.14 - 5.80 x10E6/uL - - -  HGB 13.0 - 17.0 g/dL 13.7 14.1 12.8(L)  HCT 37.5 - 51.0 % - - -  PLT 150 - 450 x10E3/uL - - -  NEUTROABS 1.7 - 7.7 K/uL - - -  LYMPHSABS 0.7 - 4.0 K/uL - - -     Body mass index  is 27.97 kg/m.  Orders:  Orders Placed This Encounter  Procedures   XR Lumbar Spine 2-3 Views   No orders of the defined types were placed in this encounter.    Procedures: No procedures performed  Clinical Data: No additional findings.  ROS:  All other systems negative, except as noted in the HPI. Review of Systems  Objective: Vital Signs: Ht 6\' 1"  (1.854 m)    Wt 212 lb (96.2 kg)    BMI 27.97 kg/m   Specialty Comments:  No specialty comments available.  PMFS History: Patient Active Problem List   Diagnosis Date Noted   Premature atrial contractions 01/16/2021   Leg edema 01/16/2021   Posterior vitreous detachment of both eyes 12/07/2019   Bilateral degenerative progressive high myopia 12/07/2019   Early stage nonexudative age-related macular degeneration of both eyes 12/07/2019   Long term (current) use of  anticoagulants 09/13/2019   It band syndrome, left 01/09/2018   Malignant neoplasm of prostate ([place]) 10/22/2017   Dupuytren's contracture of left hand 10/09/2017   Chronic pain of left knee 09/30/2017   Ear pain, left 05/17/2017   Personal history of colonic polyps 09/26/2016   Family history of colon cancer 09/26/2016   Chronic frontal sinusitis 12/05/2015   Obesity 03/29/2015   Tobacco use 03/29/2015   Other disorders of iron metabolism 12/13/2012   Depressive disorder, not elsewhere classified 12/13/2012   Obstructive sleep apnea 12/13/2012   Esophageal reflux 12/13/2012   Impaired fasting glucose 12/13/2012   Tobacco use disorder 12/13/2012   Impotence of organic origin 12/13/2012   Gout, unspecified 12/13/2012   Long term current use of anticoagulant therapy 12/13/2012   Atrial fibrillation ([place]) 01/30/2011   Bradycardia 01/30/2011   Atrial flutter ([place]) 01/25/2011   Essential hypertension, benign 01/25/2011   Pure hypercholesterolemia 01/25/2011   Past Medical History:  Diagnosis Date   Acid reflux    Arthritis    ASD (atrial septal defect)    Asthma with allergic rhinitis and status asthmaticus    NO RECENT FLARE UPS   Atrial fib/flutter, transient    Cancer ([place])    skin   Chronic anticoagulation    Depression    Dysrhythmia    ATRIAL FIB   ENT complaint    Dr. Erik Obey   Erectile dysfunction    Fatty liver    FHx: allergies    Gout    , Possible, Right Knee x2 ; intermittent milder episodes of pain in hte first MTP - uric acid 7.7- no treatment so far.   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   Pneumonia    " walking PNA > 40 years ago"   Prostate cancer ([place]) DX 2018    Seasonal allergies    Sleep apnea    wears CPAP ( set at 4-11)   Trigger finger, left    , fourth finger    Family History  Problem Relation Age of Onset   Stroke Father    Diabetes type II Father    Colon cancer Father    Heart disease Mother        with pacemaker   Breast cancer Sister  Diabetes type II Maternal Grandfather    Leukemia Paternal Uncle    Prostate cancer Other    Pancreatic cancer Neg Hx     Past Surgical History:  Procedure Laterality Date   CARDIOVERSION N/A 05/29/2016   Procedure: CARDIOVERSION;  Surgeon: [name], MD;  Location: [facility];  Service: Cardiovascular;  Laterality: N/A;   CATARACT EXTRACTION W/ INTRAOCULAR LENS  IMPLANT, BILATERAL  2016   COLONOSCOPY WITH PROPOFOL N/A 09/26/2016   Procedure: COLONOSCOPY WITH PROPOFOL;  Surgeon: Wilford Corner, MD;  Location: [facility];  Service: Endoscopy;  Laterality: N/A;   CYSTOSCOPY  01/23/2018   Procedure: CYSTOSCOPY FLEXIBLE;  Surgeon: Franchot Gallo, MD;  Location[facility];  Service: Urology;;  no seeds found in bladder   ESOPHAGOGASTRODUODENOSCOPY (EGD) WITH PROPOFOL N/A 09/26/2016   Procedure: ESOPHAGOGASTRODUODENOSCOPY (EGD) WITH PROPOFOL;  Surgeon: Wilford Corner, MD;  Location: [facility];  Service: Endoscopy;  Laterality: N/A;   LIVER BIOPSY  MANY YRS AGO   NASAL SINUS SURGERY  1997   RADIOACTIVE SEED IMPLANT N/A 01/23/2018   Procedure: RADIOACTIVE SEED IMPLANT/BRACHYTHERAPY IMPLANT;  Surgeon: Franchot Gallo, MD;  Location: [facility];  Service: Urology;  Laterality: N/A;   69 seeds implanted   SPACE OAR INSTILLATION N/A 01/23/2018   Procedure: SPACE OAR INSTILLATION;  Surgeon: Franchot Gallo, MD;  Location: [facility];  Service: Urology;  Laterality: N/A;   Social History   Occupational History   Occupation: retired  Tobacco Use   Smoking status: Every Day    Packs/day: 0.25    Years: 50.00    Pack years: 12.50     Types: Cigarettes    Start date: 10/30/2013   Smokeless tobacco: Former    Types: Chew   Tobacco comments:    trying to quit 5 TO 6 CIGS PER DAY NOW  Vaping Use   Vaping Use: Former  Substance and Sexual Activity   Alcohol use: Yes    Alcohol/week: 0.0 standard drinks    Comment: 2- glasses of red wine per night   Drug use: No   Sexual activity: Not Currently

## 2021-05-14 ENCOUNTER — Telehealth: Payer: Self-pay | Admitting: Orthopaedic Surgery

## 2021-05-14 ENCOUNTER — Ambulatory Visit (INDEPENDENT_AMBULATORY_CARE_PROVIDER_SITE_OTHER): Payer: PPO

## 2021-05-14 ENCOUNTER — Other Ambulatory Visit: Payer: Self-pay

## 2021-05-14 DIAGNOSIS — Z7901 Long term (current) use of anticoagulants: Secondary | ICD-10-CM

## 2021-05-14 DIAGNOSIS — I480 Paroxysmal atrial fibrillation: Secondary | ICD-10-CM | POA: Diagnosis not present

## 2021-05-14 DIAGNOSIS — Z5181 Encounter for therapeutic drug level monitoring: Secondary | ICD-10-CM

## 2021-05-14 LAB — POCT INR: INR: 2.2 (ref 2.0–3.0)

## 2021-05-14 NOTE — Telephone Encounter (Signed)
Called patient left message on voicemail to return call to schedule an appointment for MRI review with Dr Durward Fortes

## 2021-05-14 NOTE — Patient Instructions (Signed)
Continue taking Warfarin 2.5 tablets daily except 2 tablets on Saturdays. Recheck INR in 6 weeks. Call if placed on any new medications [phone].

## 2021-05-16 DIAGNOSIS — J0100 Acute maxillary sinusitis, unspecified: Secondary | ICD-10-CM | POA: Diagnosis not present

## 2021-05-16 DIAGNOSIS — Z9889 Other specified postprocedural states: Secondary | ICD-10-CM | POA: Diagnosis not present

## 2021-05-16 DIAGNOSIS — J329 Chronic sinusitis, unspecified: Secondary | ICD-10-CM | POA: Diagnosis not present

## 2021-05-16 DIAGNOSIS — J321 Chronic frontal sinusitis: Secondary | ICD-10-CM | POA: Diagnosis not present

## 2021-05-18 ENCOUNTER — Telehealth: Payer: Self-pay | Admitting: *Deleted

## 2021-05-18 NOTE — Telephone Encounter (Signed)
Pt left a voicemail stating he is taking Clindamycin for 14 days; returned call to the pt and he is aware this med is fine with Warfarin and that if he has any issues to call back.  ?

## 2021-05-22 ENCOUNTER — Ambulatory Visit
Admission: RE | Admit: 2021-05-22 | Discharge: 2021-05-22 | Disposition: A | Discharge status: Home / Self Care | Payer: PPO | Source: Ambulatory Visit | Attending: Physician Assistant | Admitting: Physician Assistant

## 2021-05-22 DIAGNOSIS — M545 Low back pain, unspecified: Secondary | ICD-10-CM | POA: Diagnosis not present

## 2021-05-22 DIAGNOSIS — M48061 Spinal stenosis, lumbar region without neurogenic claudication: Secondary | ICD-10-CM | POA: Diagnosis not present

## 2021-05-22 DIAGNOSIS — G8929 Other chronic pain: Secondary | ICD-10-CM

## 2021-05-22 DIAGNOSIS — M4316 Spondylolisthesis, lumbar region: Secondary | ICD-10-CM | POA: Diagnosis not present

## 2021-05-24 ENCOUNTER — Encounter: Payer: Self-pay | Admitting: Orthopaedic Surgery

## 2021-05-24 ENCOUNTER — Other Ambulatory Visit: Payer: Self-pay

## 2021-05-24 ENCOUNTER — Ambulatory Visit (INDEPENDENT_AMBULATORY_CARE_PROVIDER_SITE_OTHER): Payer: PPO | Admitting: Orthopaedic Surgery

## 2021-05-24 DIAGNOSIS — M545 Low back pain, unspecified: Secondary | ICD-10-CM | POA: Diagnosis not present

## 2021-05-24 DIAGNOSIS — G8929 Other chronic pain: Secondary | ICD-10-CM | POA: Diagnosis not present

## 2021-05-24 DIAGNOSIS — M5440 Lumbago with sciatica, unspecified side: Secondary | ICD-10-CM

## 2021-05-24 NOTE — Progress Notes (Signed)
? ?Office Visit Note ?  ?Patient: Jonathan Vargas           ?Date of Birth: [birth year]           ?MRN: [id number] ?Visit Date: 05/24/2021 ?             ?Requested by: Leeroy Cha, MD ?[address] ?[address] ?[place],  [place] [address] ?PCP: Leeroy Cha, MD ? ? ?Assessment & Plan: ?Visit Diagnoses: Lower back pain. MRI review ? ?Plan: Pleasant 76 year old gentleman who is extremely active.  He sustained an injury and had back pain after doing hack squats and the brake did not engage and he ended up squatting down very quickly.  An MRI was ordered.  His biggest complaint is that he has sporadic numbness and tingling that shoots down his left leg otherwise he is back to working out at the gym and doing yoga.  We reviewed the MRI with him no acute changes no herniated disks no nerve impingement at the time of the MRI he does have arthritis in his spine with mild anterior listhesis and mild scoliosis at L4-5 he has degenerative ration affects the facets to the greatest degree with active arthritis at L3-4 no focal or compressive narrowing.  He was given back strengthening injection exercises many of which he already does.  We will follow-up as needed ? ?Follow-Up Instructions: No follow-ups on file.  ? ?Orders:  ?No orders of the defined types were placed in this encounter. ? ?No orders of the defined types were placed in this encounter. ? ? ? ? Procedures: ?No procedures performed ? ? ?Clinical Data: ?No additional findings. ? ? ?Subjective: ?No chief complaint on file. ?Patient presents today for follow up on his lower back. He had an MRI and is here today for those results. ? ? ? ?Review of Systems  ?All other systems reviewed and are negative. ? ? ?Objective: ?Vital Signs: There were no vitals taken for this visit. ? ?Physical Exam ?Constitutional:   ?   Appearance: Normal appearance.  ?Pulmonary:  ?   Effort: Pulmonary effort is normal.  ?Skin: ?   General: Skin is warm and dry.   ?Neurological:  ?   General: No focal deficit present.  ?   Mental Status: He is alert.  ? ? ?Ortho Exam ?Examination of his lower back no palpable deformity.  He has 5 out of 5 strength which is equal bilaterally with dorsiflexion and plantarflexion of his ankles extension and flexion of his knees negative straight leg raise ?Specialty Comments:  ?No specialty comments available. ? ?Imaging: ?No results found. ? ? ?PMFS History: ?Patient Active Problem List  ? Diagnosis Date Noted  ? Premature atrial contractions 01/16/2021  ? Leg edema 01/16/2021  ? Posterior vitreous detachment of both eyes 12/07/2019  ? Bilateral degenerative progressive high myopia 12/07/2019  ? Early stage nonexudative age-related macular degeneration of both eyes 12/07/2019  ? Long term (current) use of anticoagulants 09/13/2019  ? It band syndrome, left 01/09/2018  ? Malignant neoplasm of prostate ([place]) 10/22/2017  ? Dupuytren's contracture of left hand 10/09/2017  ? Chronic pain of left knee 09/30/2017  ? Ear pain, left 05/17/2017  ? Personal history of colonic polyps 09/26/2016  ? Family history of colon cancer 09/26/2016  ? Chronic frontal sinusitis 12/05/2015  ? Obesity 03/29/2015  ? Tobacco use 03/29/2015  ? Other disorders of iron metabolism 12/13/2012  ? Depressive disorder, not elsewhere classified 12/13/2012  ? Obstructive sleep apnea 12/13/2012  ?  Esophageal reflux 12/13/2012  ? Impaired fasting glucose 12/13/2012  ? Tobacco use disorder 12/13/2012  ? Impotence of organic origin 12/13/2012  ? Gout, unspecified 12/13/2012  ? Long term current use of anticoagulant therapy 12/13/2012  ? Atrial fibrillation ([place]) 01/30/2011  ? Bradycardia 01/30/2011  ? Atrial flutter ([place]) 01/25/2011  ? Essential hypertension, benign 01/25/2011  ? Pure hypercholesterolemia 01/25/2011  ? ?Past Medical History:  ?Diagnosis Date  ? Acid reflux   ? Arthritis   ? ASD (atrial septal defect)   ? Asthma with allergic rhinitis and status asthmaticus   ? NO  RECENT FLARE UPS  ? Atrial fib/flutter, transient   ? Cancer [facility])   ? skin  ? Chronic anticoagulation   ? Depression   ? Dysrhythmia   ? ATRIAL FIB  ? ENT complaint   ? Dr. Erik Obey  ? Erectile dysfunction   ? Fatty liver   ? FHx: allergies   ? Gout   ? , Possible, Right Knee x2 ; intermittent milder episodes of pain in hte first MTP - uric acid 7.7- no treatment so far.  ? Hemochromatosis   ? DOES TWICE YEAR  ? History of kidney stones   ? SMALL IN RIGHT KIDNEY  ? Hypercholesteremia   ? Hyperlipemia   ? PT DENIES  ? Hypertension   ? Hypogonadism male   ? Impaired fasting glucose   ? Peripheral vascular disease ([place]) 1997  ? 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  ? Pneumonia   ? " walking PNA > 40 years ago"  ? Prostate cancer ([place]) [place] 2018  ? Seasonal allergies   ? Sleep apnea   ? wears CPAP ( set at 4-11)  ? Trigger finger, left   ? , fourth finger  ?  ?Family History  ?Problem Relation Age of Onset  ? Stroke Father   ? Diabetes type II Father   ? Colon cancer Father   ? Heart disease Mother   ?     with pacemaker  ? Breast cancer Sister   ? Diabetes type II Maternal Grandfather   ? Leukemia Paternal Uncle   ? Prostate cancer Other   ? Pancreatic cancer Neg Hx   ?  ?Past Surgical History:  ?Procedure Laterality Date  ? CARDIOVERSION N/A 05/29/2016  ? Procedure: CARDIOVERSION;  Surgeon: [name], MD;  Location: [facility];  Service: Cardiovascular;  Laterality: N/A;  ? CATARACT EXTRACTION W/ INTRAOCULAR LENS  IMPLANT, BILATERAL  2016  ? COLONOSCOPY WITH PROPOFOL N/A 09/26/2016  ? Procedure: COLONOSCOPY WITH PROPOFOL;  Surgeon: Wilford Corner, MD;  Location: [facility];  Service: Endoscopy;  Laterality: N/A;  ? CYSTOSCOPY  01/23/2018  ? Procedure: CYSTOSCOPY FLEXIBLE;  Surgeon: Franchot Gallo, MD;  Location[facility];  Service: Urology;;  no seeds found in bladder  ? ESOPHAGOGASTRODUODENOSCOPY (EGD) WITH PROPOFOL N/A 09/26/2016  ? Procedure:  ESOPHAGOGASTRODUODENOSCOPY (EGD) WITH PROPOFOL;  Surgeon: Wilford Corner, MD;  Location: [facility];  Service: Endoscopy;  Laterality: N/A;  ? LIVER BIOPSY  MANY YRS AGO  ? NASAL SINUS SURGERY  1997  ? RADIOACTIVE SEED IMPLANT N/A 01/23/2018  ? Procedure: RADIOACTIVE SEED IMPLANT/BRACHYTHERAPY IMPLANT;  Surgeon: Franchot Gallo, MD;  Location: [facility];  Service: Urology;  Laterality: N/A;   69 seeds implanted  ? SPACE OAR INSTILLATION N/A 01/23/2018  ? Procedure: SPACE OAR INSTILLATION;  Surgeon: Franchot Gallo, MD;  Location: [facility];  Service: Urology;  Laterality: N/A;  ? ?Social History  ? ?  Occupational History  ? Occupation: retired  ?Tobacco Use  ? Smoking status: Every Day  ?  Packs/day: 0.25  ?  Years: 50.00  ?  Pack years: 12.50  ?  Types: Cigarettes  ?  Start date: 10/30/2013  ? Smokeless tobacco: Former  ?  Types: Chew  ? Tobacco comments:  ?  trying to quit 5 TO 6 CIGS PER DAY NOW  ?Vaping Use  ? Vaping Use: Former  ?Substance and Sexual Activity  ? Alcohol use: Yes  ?  Alcohol/week: 0.0 standard drinks  ?  Comment: 2- glasses of red wine per night  ? Drug use: No  ? Sexual activity: Not Currently  ? ? ? ? ? ? ?

## 2021-06-04 DIAGNOSIS — G4733 Obstructive sleep apnea (adult) (pediatric): Secondary | ICD-10-CM | POA: Diagnosis not present

## 2021-06-16 ENCOUNTER — Other Ambulatory Visit: Payer: Self-pay | Admitting: Cardiology

## 2021-06-16 DIAGNOSIS — I480 Paroxysmal atrial fibrillation: Secondary | ICD-10-CM

## 2021-06-21 DIAGNOSIS — F17200 Nicotine dependence, unspecified, uncomplicated: Secondary | ICD-10-CM | POA: Diagnosis not present

## 2021-06-21 DIAGNOSIS — I480 Paroxysmal atrial fibrillation: Secondary | ICD-10-CM | POA: Diagnosis not present

## 2021-06-21 DIAGNOSIS — J449 Chronic obstructive pulmonary disease, unspecified: Secondary | ICD-10-CM | POA: Diagnosis not present

## 2021-06-25 ENCOUNTER — Ambulatory Visit (INDEPENDENT_AMBULATORY_CARE_PROVIDER_SITE_OTHER): Payer: PPO

## 2021-06-25 DIAGNOSIS — Z5181 Encounter for therapeutic drug level monitoring: Secondary | ICD-10-CM

## 2021-06-25 DIAGNOSIS — Z7901 Long term (current) use of anticoagulants: Secondary | ICD-10-CM | POA: Diagnosis not present

## 2021-06-25 DIAGNOSIS — I480 Paroxysmal atrial fibrillation: Secondary | ICD-10-CM | POA: Diagnosis not present

## 2021-06-25 LAB — POCT INR: INR: 2.6 (ref 2.0–3.0)

## 2021-06-25 NOTE — Patient Instructions (Signed)
Continue taking Warfarin 2.5 tablets daily except 2 tablets on Saturdays. Recheck INR in 6 weeks. Call if placed on any new medications [phone].    ?

## 2021-06-26 DIAGNOSIS — M25532 Pain in left wrist: Secondary | ICD-10-CM | POA: Diagnosis not present

## 2021-06-26 DIAGNOSIS — M720 Palmar fascial fibromatosis [Dupuytren]: Secondary | ICD-10-CM | POA: Diagnosis not present

## 2021-07-03 DIAGNOSIS — G4733 Obstructive sleep apnea (adult) (pediatric): Secondary | ICD-10-CM | POA: Diagnosis not present

## 2021-07-16 DIAGNOSIS — E785 Hyperlipidemia, unspecified: Secondary | ICD-10-CM | POA: Diagnosis not present

## 2021-07-16 DIAGNOSIS — I10 Essential (primary) hypertension: Secondary | ICD-10-CM | POA: Diagnosis not present

## 2021-07-16 DIAGNOSIS — J320 Chronic maxillary sinusitis: Secondary | ICD-10-CM | POA: Diagnosis not present

## 2021-07-23 DIAGNOSIS — R195 Other fecal abnormalities: Secondary | ICD-10-CM | POA: Diagnosis not present

## 2021-07-23 DIAGNOSIS — R5383 Other fatigue: Secondary | ICD-10-CM | POA: Diagnosis not present

## 2021-07-23 DIAGNOSIS — R197 Diarrhea, unspecified: Secondary | ICD-10-CM | POA: Diagnosis not present

## 2021-07-25 DIAGNOSIS — R5383 Other fatigue: Secondary | ICD-10-CM | POA: Diagnosis not present

## 2021-07-25 DIAGNOSIS — R197 Diarrhea, unspecified: Secondary | ICD-10-CM | POA: Diagnosis not present

## 2021-07-30 DIAGNOSIS — J0100 Acute maxillary sinusitis, unspecified: Secondary | ICD-10-CM | POA: Diagnosis not present

## 2021-07-30 DIAGNOSIS — J321 Chronic frontal sinusitis: Secondary | ICD-10-CM | POA: Diagnosis not present

## 2021-08-01 DIAGNOSIS — M1A09X0 Idiopathic chronic gout, multiple sites, without tophus (tophi): Secondary | ICD-10-CM | POA: Diagnosis not present

## 2021-08-01 DIAGNOSIS — M1991 Primary osteoarthritis, unspecified site: Secondary | ICD-10-CM | POA: Diagnosis not present

## 2021-08-01 DIAGNOSIS — Z6829 Body mass index (BMI) 29.0-29.9, adult: Secondary | ICD-10-CM | POA: Diagnosis not present

## 2021-08-01 DIAGNOSIS — Z79899 Other long term (current) drug therapy: Secondary | ICD-10-CM | POA: Diagnosis not present

## 2021-08-01 DIAGNOSIS — E663 Overweight: Secondary | ICD-10-CM | POA: Diagnosis not present

## 2021-08-06 ENCOUNTER — Ambulatory Visit (INDEPENDENT_AMBULATORY_CARE_PROVIDER_SITE_OTHER): Payer: PPO | Admitting: Pharmacist

## 2021-08-06 DIAGNOSIS — I480 Paroxysmal atrial fibrillation: Secondary | ICD-10-CM

## 2021-08-06 DIAGNOSIS — Z7901 Long term (current) use of anticoagulants: Secondary | ICD-10-CM

## 2021-08-06 DIAGNOSIS — Z85828 Personal history of other malignant neoplasm of skin: Secondary | ICD-10-CM | POA: Diagnosis not present

## 2021-08-06 DIAGNOSIS — L820 Inflamed seborrheic keratosis: Secondary | ICD-10-CM | POA: Diagnosis not present

## 2021-08-06 DIAGNOSIS — D485 Neoplasm of uncertain behavior of skin: Secondary | ICD-10-CM | POA: Diagnosis not present

## 2021-08-06 LAB — POCT INR: INR: 3.4 — AB (ref 2.0–3.0)

## 2021-08-06 NOTE — Patient Instructions (Signed)
Hold warfarin tonight then continue taking Warfarin 2.5 tablets daily except 2 tablets on Saturdays. Recheck INR in 3 weeks. Call if placed on any new medications [phone].

## 2021-08-23 DIAGNOSIS — H40013 Open angle with borderline findings, low risk, bilateral: Secondary | ICD-10-CM | POA: Diagnosis not present

## 2021-08-23 DIAGNOSIS — H43813 Vitreous degeneration, bilateral: Secondary | ICD-10-CM | POA: Diagnosis not present

## 2021-08-23 DIAGNOSIS — H4903 Third [oculomotor] nerve palsy, bilateral: Secondary | ICD-10-CM | POA: Diagnosis not present

## 2021-08-23 DIAGNOSIS — H21233 Degeneration of iris (pigmentary), bilateral: Secondary | ICD-10-CM | POA: Diagnosis not present

## 2021-08-24 DIAGNOSIS — I480 Paroxysmal atrial fibrillation: Secondary | ICD-10-CM | POA: Diagnosis not present

## 2021-08-24 DIAGNOSIS — K219 Gastro-esophageal reflux disease without esophagitis: Secondary | ICD-10-CM | POA: Diagnosis not present

## 2021-08-24 DIAGNOSIS — J4520 Mild intermittent asthma, uncomplicated: Secondary | ICD-10-CM | POA: Diagnosis not present

## 2021-08-24 DIAGNOSIS — I10 Essential (primary) hypertension: Secondary | ICD-10-CM | POA: Diagnosis not present

## 2021-08-24 DIAGNOSIS — E785 Hyperlipidemia, unspecified: Secondary | ICD-10-CM | POA: Diagnosis not present

## 2021-08-27 ENCOUNTER — Ambulatory Visit (INDEPENDENT_AMBULATORY_CARE_PROVIDER_SITE_OTHER): Payer: PPO | Admitting: *Deleted

## 2021-08-27 DIAGNOSIS — Z7901 Long term (current) use of anticoagulants: Secondary | ICD-10-CM | POA: Diagnosis not present

## 2021-08-27 DIAGNOSIS — I480 Paroxysmal atrial fibrillation: Secondary | ICD-10-CM

## 2021-08-27 LAB — POCT INR: INR: 3 (ref 2.0–3.0)

## 2021-08-27 NOTE — Patient Instructions (Signed)
Description   Continue taking Warfarin 2.5 tablets daily except 2 tablets on Saturdays. Recheck INR in 4 weeks. Call if placed on any new medications [phone].         

## 2021-09-12 ENCOUNTER — Other Ambulatory Visit (HOSPITAL_COMMUNITY): Payer: Self-pay | Admitting: *Deleted

## 2021-09-13 ENCOUNTER — Ambulatory Visit (HOSPITAL_COMMUNITY): Admission: RE | Admit: 2021-09-13 | Payer: PPO | Source: Ambulatory Visit

## 2021-09-24 ENCOUNTER — Ambulatory Visit (INDEPENDENT_AMBULATORY_CARE_PROVIDER_SITE_OTHER): Payer: PPO | Admitting: *Deleted

## 2021-09-24 DIAGNOSIS — I480 Paroxysmal atrial fibrillation: Secondary | ICD-10-CM

## 2021-09-24 DIAGNOSIS — Z7901 Long term (current) use of anticoagulants: Secondary | ICD-10-CM

## 2021-09-24 LAB — POCT INR: INR: 2.7 (ref 2.0–3.0)

## 2021-09-24 NOTE — Patient Instructions (Addendum)
Description   Continue taking Warfarin 2.5 tablets daily except 2 tablets on Saturdays. Recheck INR in 4 weeks. Call if placed on any new medications [phone].

## 2021-09-25 ENCOUNTER — Ambulatory Visit (HOSPITAL_COMMUNITY)
Admission: RE | Admit: 2021-09-25 | Discharge: 2021-09-25 | Disposition: A | Discharge status: Home / Self Care | Payer: PPO | Source: Ambulatory Visit | Attending: Internal Medicine | Admitting: Internal Medicine

## 2021-09-25 MED ORDER — LIDOCAINE HCL 2 % IJ SOLN: 0.1000 mL | Freq: Once | INTRAMUSCULAR | Status: DC

## 2021-09-27 ENCOUNTER — Telehealth: Payer: Self-pay

## 2021-09-27 DIAGNOSIS — I700 Atherosclerosis of aorta: Secondary | ICD-10-CM

## 2021-09-27 NOTE — Telephone Encounter (Signed)
   Name: Jonathan Vargas  DOB: 30-Nov-1945  MRN: [id number]  Primary Cardiologist: Candee Furbish, MD  Chart reviewed as part of pre-operative protocol coverage. Because of [name]'s past medical history and time since last visit, he will require a follow-up tele visit in order to better assess preoperative cardiovascular risk.  Pre-op covering staff: - Please schedule appointment and call patient to inform them. If patient already had an upcoming appointment within acceptable timeframe, please add "pre-op clearance" to the appointment notes so provider is aware. - Please contact requesting surgeon's office via preferred method (i.e, phone, fax) to inform them of need for appointment prior to surgery.  This message will also be routed to pharmacy pool  for input on holding coumadin as requested below so that this information is available to the clearing provider at time of patient's appointment.   Elgie Collard, PA-C  09/27/2021, 12:19 PM

## 2021-09-27 NOTE — Telephone Encounter (Signed)
   Pre-operative Risk Assessment    Patient Name: Jonathan Vargas  DOB: 1945/09/22 MRN: [id number]      Request for Surgical Clearance    Procedure:   Colonoscopy  Date of Surgery:  Clearance TBD                                 Surgeon:  Dr. Mauricio Po Group or Practice Name:  [facility] Phone number:  [phone] Fax number:  [phone]   Type of Clearance Requested:   - Medical  - Pharmacy:  Hold Warfarin (Coumadin)     Type of Anesthesia:   Propofol   Additional requests/questions:   None  Signed, Ovidio Steele   09/27/2021, 8:40 AM

## 2021-09-27 NOTE — Telephone Encounter (Signed)
Patient with diagnosis of afib on warfarin for anticoagulation.    Procedure: colonoscopy Date of procedure: TBD  CHA2DS2-VASc Score = 4  This indicates a 4.8% annual risk of stroke. The patient's score is based upon: CHF History: 0 HTN History: 1 Diabetes History: 0 Stroke History: 0 Vascular Disease History: 1 Age Score: 2 Gender Score: 0   HTN, aortic atherosclerosis noted on abdominal CT 06/2019, PMH updated.  CrCl 34m/min using adjusted body weight Platelet count 192K  Per office protocol, patient can hold warfarin for 5 days prior to procedure. Patient will not need bridging with Lovenox around procedure.  **This guidance is not considered finalized until pre-operative APP has relayed final recommendations.**

## 2021-09-28 NOTE — Telephone Encounter (Signed)
Called pt to arrange tele visit for surgical clearance.  Pt informed me he is due to come in an see someone as he is supposed to come every 6 months. Per pt request, scheduled an in-office appointment, 10/03/21 with Richardson Dopp, PA-C, clearance will be addressed at that time.  Will route to the requesting surgeon's office to make them aware.

## 2021-10-02 DIAGNOSIS — I2510 Atherosclerotic heart disease of native coronary artery without angina pectoris: Secondary | ICD-10-CM | POA: Insufficient documentation

## 2021-10-02 DIAGNOSIS — Z01810 Encounter for preprocedural cardiovascular examination: Secondary | ICD-10-CM | POA: Insufficient documentation

## 2021-10-02 NOTE — Progress Notes (Unsigned)
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 adult with the above problem list.  He was last seen in Nov 2022. He returns for surgical clearance. He needs a colonoscopy with Dr. Alessandra Bevels under conscious sedation. His Warfarin will need to be held. His Warfarin can be held for 5 days without Lovenox bridge.   He is here alone today.  Recently, he has had issues with his blood pressure dropping after taking his irbesartan in the morning.  He has started taking this later that day without any recurrent issues.  He has not had chest pain.  He does have some shortness of breath related to asthma which is relieved by his rescue inhaler.  Otherwise, he has not had significant dyspnea with exertion.  He has not had orthopnea.  He sprained his ankle  recently on the left and has had more swelling in the ankle than the right.  He has not had syncope.    Past Medical History:  Diagnosis Date   Acid reflux    Arthritis    ASD (atrial septal defect)    Asthma with allergic rhinitis and status asthmaticus    NO RECENT FLARE UPS   Atrial fib/flutter, transient    Cancer ([place])    skin   Chronic anticoagulation    Depression    Dysrhythmia    ATRIAL FIB   ENT complaint    Dr. Erik Obey   Erectile dysfunction    Fatty liver    FHx: allergies    Gout    , Possible, Right Knee x2 ; intermittent milder episodes of pain in hte first MTP - uric acid 7.7- no treatment so far.   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   Pneumonia    " walking PNA > 40 years ago"   Prostate cancer ([place]) DX 2018   Seasonal allergies    Sleep apnea    wears CPAP ( set at 4-11)   Trigger finger, left    , fourth finger   Current Medications: Current Meds  Medication Sig  albuterol (PROVENTIL HFA;VENTOLIN HFA) 108 (90 Base) MCG/ACT inhaler Inhale 2 puffs into the lungs every 6 (six) hours as needed for wheezing or shortness of breath.   allopurinol (ZYLOPRIM) 300 MG tablet Take 300 mg by mouth every evening.    CALCIUM PO Take 200 mg by mouth in the morning, at noon, and at bedtime.    Cholecalciferol (VITAMIN D3) 125 MCG (5000 UT) CAPS Take 1 capsule by mouth daily.   COLCRYS 0.6 MG tablet Take 0.6 mg by mouth 2 (two) times daily as needed (for gout).    diclofenac sodium (VOLTAREN) 1 % GEL Apply 1 application topically 4 (four) times daily as needed (for pain).    fexofenadine (ALLEGRA) 60 MG tablet Take 60 mg by mouth daily as needed. For allergies   flecainide (TAMBOCOR) 50  MG tablet TAKE 1 TABLET BY MOUTH 2 TIMES DAILY FOR THE HEART.   irbesartan (AVAPRO) 150 MG tablet TAKE 1 TABLET BY MOUTH DAILY.   MAGNESIUM PO Take 4 capsules by mouth every evening.    Menaquinone-7 (VITAMIN K2) 100 MCG CAPS Take by mouth daily.   Misc Natural Products (LUTEIN 20) CAPS Take 40 mg by mouth 2 (two) times daily.    NON FORMULARY CPAP   rosuvastatin (CRESTOR) 5 MG tablet Patient takes 5 mg tablet by mouth 3 days weekly on mon, wed, fri   TURMERIC PO Take 1 capsule by mouth 2 (two) times daily.    vitamin B-12 (CYANOCOBALAMIN) 1000 MCG tablet Take 1,000 mcg by mouth daily.   warfarin (COUMADIN) 5 MG tablet TAKE 2 TO 2.5 TABLETS BY MOUTH ONCE DAILY AS DIRECTED BY COUMADIN CLINIC    Allergies:   Clindamycin/lincomycin, Ciprofloxacin, and Amoxicillin   Social History   Tobacco Use   Smoking status: Every Day    Packs/day: 0.25    Years: 50.00    Total pack years: 12.50    Types: Cigarettes    Start date: 10/30/2013   Smokeless tobacco: Former    Types: Chew   Tobacco comments:    trying to quit 5 TO 6 CIGS PER DAY NOW  Vaping Use   Vaping Use: Former  Substance Use Topics   Alcohol use: Yes    Alcohol/week: 0.0 standard drinks of alcohol    Comment: 2- glasses of red wine per night   Drug use: No    Family Hx: The patient's family history includes Breast cancer in his sister; Colon cancer in his father; Diabetes type II in his father and maternal grandfather; Heart disease in his mother; Leukemia in his paternal uncle; Prostate cancer in an other family member; Stroke in his father. There is no history of Pancreatic cancer.  Review of Systems  Gastrointestinal:  Negative for hematochezia.  Genitourinary:  Negative for hematuria.     EKGs/Labs/Other Test Reviewed:    EKG:  EKG is   ordered today.  The ekg ordered today demonstrates sinus bradycardia, HR 56, left axis deviation, incomplete right bundle branch block, QTc 418, nonspecific ST-T wave changes similar to  prior tracing  Recent Labs: 02/12/2021: Hemoglobin 13.7   Labs obtained through [facility] - personally reviewed and interpreted: 01/15/2021: Total cholesterol 130, HDL 45, LDL 61, triglycerides 139 08/01/2021: Hgb 14.7, creatinine 1.04, ALT 13 07/23/2021: K+ 4.4  Risk Assessment/Calculations/Metrics:    CHA2DS2-VASc Score = 4   This indicates a 4.8% annual risk of stroke. The patient's score is based upon: CHF History: 0 HTN History: 1 Diabetes History: 0 Stroke History: 0  Vascular Disease History: 1 Age Score: 2 Gender Score: 0             Physical Exam:    VS:  BP 124/62   Pulse (!) 56   Ht '6\' 1"'$  (1.854 m)   Wt 221 lb 9.6 oz (100.5 kg)   SpO2 97%   BMI 29.24 kg/m     Wt Readings from Last 3 Encounters:  10/03/21 221 lb 9.6 oz (100.5 kg)  04/30/21 212 lb (96.2 kg)  02/12/21 216 lb (98 kg)    Constitutional:      Appearance: Healthy appearance. Not in distress.  Neck:     Vascular: No carotid bruit or JVR. JVD normal.  Pulmonary:     Effort: Pulmonary effort is normal.     Breath sounds: No wheezing. No rales.  Cardiovascular:     Normal rate. Regular rhythm. Normal S1. Normal S2.      Murmurs: There is no murmur.  Edema:    Peripheral edema present.    Ankle: 1+ edema of the left ankle and trace edema of the right ankle. Abdominal:     Palpations: Abdomen is soft.  Skin:    General: Skin is warm and dry.  Neurological:     General: No focal deficit present.     Mental Status: Alert and oriented to person, place and time.        ASSESSMENT & PLAN:   Paroxysmal atrial fibrillation (HCC) Maintaining sinus rhythm.  He has remained on flecainide for quite some time.  QRS on electrocardiogram today is 112 ms.  He is not on AV nodal blocking agents secondary to bradycardia.  He is tolerating anticoagulation which is managed in our Coumadin clinic.  Continue flecainide 50 mg twice daily, Coumadin as directed by the Coumadin clinic.  Follow-up in 6  months.  Preoperative cardiovascular examination Mr. Zunker's perioperative risk of a major cardiac event is 0.4% according to the Revised Cardiac Risk Index (RCRI).  Therefore, he is at low risk for perioperative complications.   His functional capacity is good at 4.74 METs according to the Duke Activity Status Index (DASI). Recommendations: According to ACC/AHA guidelines, no further cardiovascular testing needed.  The patient may proceed to surgery at acceptable risk.   Antiplatelet and/or Anticoagulation Recommendations: Coumadin can by held for 5 days prior to surgery.  Please resume post op when felt to be safe.  Coronary artery calcification He is not having anginal symptoms.  He is not on antiplatelet therapy as he does require long-term warfarin therapy.  Continue rosuvastatin 5 mg 3 times a week.  Pure hypercholesterolemia LDL optimal.  Continue rosuvastatin 5 mg 3 times a week.  Essential hypertension, benign Blood pressure has been low at times.  He has had less fluctuations in his blood pressure by taking his irbesartan in the evening.  Continue irbesartan 150 mg daily.  If he has more issues with hypotension, we can decrease his irbesartan dose to 75 mg daily.  Aortic atherosclerosis ([place]) As noted, he is not on antiplatelet therapy as he is on warfarin.  Continue rosuvastatin.  Tobacco use I have recommended cessation and we discussed the importance of quitting.            Dispo:  Return in about 6 months (around 04/05/2022) for Routine Follow Up w/ Dr. Marlou Porch, or Richardson Dopp, PA-C.   Medication Adjustments/Labs and Tests Ordered: Current medicines are reviewed at length with the patient today.  Concerns regarding medicines are outlined  above.  Tests Ordered: Orders Placed This Encounter  Procedures   EKG 12-Lead   Medication Changes: No orders of the defined types were placed in this encounter.  Signed, Richardson Dopp, PA-C  10/03/2021 5:13 PM    [address], [place], [place]  [address] Phone: [phone]; Fax: [phone]

## 2021-10-03 ENCOUNTER — Ambulatory Visit (INDEPENDENT_AMBULATORY_CARE_PROVIDER_SITE_OTHER): Payer: PPO | Admitting: Physician Assistant

## 2021-10-03 ENCOUNTER — Encounter: Payer: Self-pay | Admitting: Physician Assistant

## 2021-10-03 VITALS — BP 124/62 | HR 56 | Ht 73.0 in | Wt 221.6 lb

## 2021-10-03 DIAGNOSIS — Z01810 Encounter for preprocedural cardiovascular examination: Secondary | ICD-10-CM

## 2021-10-03 DIAGNOSIS — Z720 Tobacco use: Secondary | ICD-10-CM | POA: Diagnosis not present

## 2021-10-03 DIAGNOSIS — I2510 Atherosclerotic heart disease of native coronary artery without angina pectoris: Secondary | ICD-10-CM

## 2021-10-03 DIAGNOSIS — E7800 Pure hypercholesterolemia, unspecified: Secondary | ICD-10-CM | POA: Diagnosis not present

## 2021-10-03 DIAGNOSIS — I700 Atherosclerosis of aorta: Secondary | ICD-10-CM

## 2021-10-03 DIAGNOSIS — I10 Essential (primary) hypertension: Secondary | ICD-10-CM | POA: Diagnosis not present

## 2021-10-03 DIAGNOSIS — I480 Paroxysmal atrial fibrillation: Secondary | ICD-10-CM

## 2021-10-03 DIAGNOSIS — I2584 Coronary atherosclerosis due to calcified coronary lesion: Secondary | ICD-10-CM | POA: Diagnosis not present

## 2021-10-03 NOTE — Assessment & Plan Note (Signed)
Blood pressure has been low at times.  He has had less fluctuations in his blood pressure by taking his irbesartan in the evening.  Continue irbesartan 150 mg daily.  If he has more issues with hypotension, we can decrease his irbesartan dose to 75 mg daily.

## 2021-10-03 NOTE — Assessment & Plan Note (Signed)
LDL optimal.  Continue rosuvastatin 5 mg 3 times a week.

## 2021-10-03 NOTE — Assessment & Plan Note (Signed)
I have recommended cessation and we discussed the importance of quitting.

## 2021-10-03 NOTE — Assessment & Plan Note (Signed)
He is not having anginal symptoms.  He is not on antiplatelet therapy as he does require long-term warfarin therapy.  Continue rosuvastatin 5 mg 3 times a week.

## 2021-10-03 NOTE — Assessment & Plan Note (Addendum)
Mr. Mcelhannon's perioperative risk of a major cardiac event is 0.4% according to the Revised Cardiac Risk Index (RCRI).  Therefore, he is at low risk for perioperative complications.   His functional capacity is good at 4.74 METs according to the Duke Activity Status Index (DASI). Recommendations: According to ACC/AHA guidelines, no further cardiovascular testing needed.  The patient may proceed to surgery at acceptable risk.   Antiplatelet and/or Anticoagulation Recommendations: Coumadin can by held for 5 days prior to surgery.  Please resume post op when felt to be safe.

## 2021-10-03 NOTE — Assessment & Plan Note (Signed)
As noted, he is not on antiplatelet therapy as he is on warfarin.  Continue rosuvastatin.

## 2021-10-03 NOTE — Assessment & Plan Note (Signed)
Maintaining sinus rhythm.  He has remained on flecainide for quite some time.  QRS on electrocardiogram today is 112 ms.  He is not on AV nodal blocking agents secondary to bradycardia.  He is tolerating anticoagulation which is managed in our Coumadin clinic.  Continue flecainide 50 mg twice daily, Coumadin as directed by the Coumadin clinic.  Follow-up in 6 months.

## 2021-10-03 NOTE — Patient Instructions (Signed)
Medication Instructions:  Your physician recommends that you continue on your current medications as directed. Please refer to the Current Medication list given to you today.  *If you need a refill on your cardiac medications before your next appointment, please call your pharmacy*   Lab Work: None ordered  If you have labs (blood work) drawn today and your tests are completely normal, you will receive your results only by: [place] (if you have MyChart) OR A paper copy in the mail If you have any lab test that is abnormal or we need to change your treatment, we will call you to review the results.   Testing/Procedures: None ordered   Follow-Up: At [facility], you and your health needs are our priority.  As part of our continuing mission to provide you with exceptional heart care, we have created designated Provider Care Teams.  These Care Teams include your primary Cardiologist (physician) and Advanced Practice Providers (APPs -  Physician Assistants and Nurse Practitioners) who all work together to provide you with the care you need, when you need it.  We recommend signing up for the patient portal called "MyChart".  Sign up information is provided on this After Visit Summary.  MyChart is used to connect with patients for Virtual Visits (Telemedicine).  Patients are able to view lab/test results, encounter notes, upcoming appointments, etc.  Non-urgent messages can be sent to your provider as well.   To learn more about what you can do with MyChart, go to [email or web address].    Your next appointment:   6 month(s)  The format for your next appointment:   In Person  Provider:   Candee Furbish, MD  or Richardson Dopp, PA-C         Other Instructions   Important Information About Sugar

## 2021-10-23 ENCOUNTER — Ambulatory Visit (INDEPENDENT_AMBULATORY_CARE_PROVIDER_SITE_OTHER): Payer: PPO | Admitting: *Deleted

## 2021-10-23 DIAGNOSIS — I480 Paroxysmal atrial fibrillation: Secondary | ICD-10-CM | POA: Diagnosis not present

## 2021-10-23 DIAGNOSIS — Z7901 Long term (current) use of anticoagulants: Secondary | ICD-10-CM

## 2021-10-23 LAB — POCT INR: INR: 2.7 (ref 2.0–3.0)

## 2021-10-23 NOTE — Patient Instructions (Signed)
Description   Continue taking Warfarin 2.5 tablets daily except 2 tablets on Saturdays. Recheck INR in 4 weeks. Call if placed on any new medications [phone].

## 2021-11-05 DIAGNOSIS — J329 Chronic sinusitis, unspecified: Secondary | ICD-10-CM | POA: Diagnosis not present

## 2021-11-05 DIAGNOSIS — F17210 Nicotine dependence, cigarettes, uncomplicated: Secondary | ICD-10-CM | POA: Diagnosis not present

## 2021-11-05 DIAGNOSIS — J309 Allergic rhinitis, unspecified: Secondary | ICD-10-CM | POA: Diagnosis not present

## 2021-11-21 ENCOUNTER — Ambulatory Visit: Payer: PPO | Attending: Cardiology | Admitting: *Deleted

## 2021-11-21 DIAGNOSIS — I480 Paroxysmal atrial fibrillation: Secondary | ICD-10-CM | POA: Diagnosis not present

## 2021-11-21 DIAGNOSIS — Z7901 Long term (current) use of anticoagulants: Secondary | ICD-10-CM | POA: Diagnosis not present

## 2021-11-21 LAB — POCT INR: INR: 3.1 — AB (ref 2.0–3.0)

## 2021-11-21 NOTE — Patient Instructions (Signed)
Description   Today take 2 tablets then continue taking Warfarin 2.5 tablets daily except 2 tablets on Saturdays. Recheck INR in 3 weeks. Add another leafy veggie in your diet. Call if placed on any new medications [phone].

## 2021-12-12 ENCOUNTER — Ambulatory Visit: Payer: PPO | Attending: Cardiology

## 2021-12-12 DIAGNOSIS — I480 Paroxysmal atrial fibrillation: Secondary | ICD-10-CM | POA: Diagnosis not present

## 2021-12-12 DIAGNOSIS — Z7901 Long term (current) use of anticoagulants: Secondary | ICD-10-CM | POA: Diagnosis not present

## 2021-12-12 LAB — POCT INR: INR: 3 (ref 2.0–3.0)

## 2021-12-12 NOTE — Patient Instructions (Signed)
Description   START taking Warfarin 2.5 tablets daily except 2 tablets on Wednesdays and Saturdays. Recheck INR in 3 weeks.  Stay consistent with greens each week.  Call if placed on any new medications [phone].

## 2021-12-13 ENCOUNTER — Ambulatory Visit (INDEPENDENT_AMBULATORY_CARE_PROVIDER_SITE_OTHER): Payer: PPO | Admitting: Ophthalmology

## 2021-12-13 ENCOUNTER — Encounter (INDEPENDENT_AMBULATORY_CARE_PROVIDER_SITE_OTHER): Payer: Self-pay | Admitting: Ophthalmology

## 2021-12-13 DIAGNOSIS — H43813 Vitreous degeneration, bilateral: Secondary | ICD-10-CM

## 2021-12-13 DIAGNOSIS — H4423 Degenerative myopia, bilateral: Secondary | ICD-10-CM

## 2021-12-13 DIAGNOSIS — H353131 Nonexudative age-related macular degeneration, bilateral, early dry stage: Secondary | ICD-10-CM

## 2021-12-13 DIAGNOSIS — Z961 Presence of intraocular lens: Secondary | ICD-10-CM | POA: Diagnosis not present

## 2021-12-13 NOTE — Assessment & Plan Note (Signed)
Mild OU no signs of geographic atrophy from age-related maculopathy

## 2021-12-13 NOTE — Assessment & Plan Note (Signed)
Stable OU 

## 2021-12-13 NOTE — Assessment & Plan Note (Signed)
bilateral myopic macular degeneration, no progression over time and no signs of complications

## 2021-12-13 NOTE — Progress Notes (Addendum)
12/13/2021     CHIEF COMPLAINT Patient presents for  Chief Complaint  Patient presents with   Posterior Vitreous Detachment      HISTORY OF PRESENT ILLNESS: Jonathan Vargas is a 76 y.o. adult who presents to the clinic today for:   HPI   1 YR FU OU OCT FP.  Pt stated, "id say my vision stayed about the same. There were times where id have some spells where I'm not seeing as well." Pt has allergy to CIPRO. Pt has floaters in both eyes.  Last edited by Silvestre Moment on 12/13/2021  1:32 PM.      Referring physician: Monna Fam, MD [place],  [place] [id number]  HISTORICAL INFORMATION:   Selected notes from the MEDICAL RECORD NUMBER       CURRENT MEDICATIONS: No current outpatient medications on file. (Ophthalmic Drugs)   No current facility-administered medications for this visit. (Ophthalmic Drugs)   Current Outpatient Medications (Other)  Medication Sig   albuterol (PROVENTIL HFA;VENTOLIN HFA) 108 (90 Base) MCG/ACT inhaler Inhale 2 puffs into the lungs every 6 (six) hours as needed for wheezing or shortness of breath.   allopurinol (ZYLOPRIM) 300 MG tablet Take 300 mg by mouth every evening.    CALCIUM PO Take 200 mg by mouth in the morning, at noon, and at bedtime.    Cholecalciferol (VITAMIN D3) 125 MCG (5000 UT) CAPS Take 1 capsule by mouth daily.   COLCRYS 0.6 MG tablet Take 0.6 mg by mouth 2 (two) times daily as needed (for gout).    diclofenac sodium (VOLTAREN) 1 % GEL Apply 1 application topically 4 (four) times daily as needed (for pain).    fexofenadine (ALLEGRA) 60 MG tablet Take 60 mg by mouth daily as needed. For allergies   flecainide (TAMBOCOR) 50 MG tablet TAKE 1 TABLET BY MOUTH 2 TIMES DAILY FOR THE HEART.   irbesartan (AVAPRO) 150 MG tablet TAKE 1 TABLET BY MOUTH DAILY.   MAGNESIUM PO Take 4 capsules by mouth every evening.    Menaquinone-7 (VITAMIN K2) 100 MCG CAPS Take by mouth daily.   Misc Natural Products (LUTEIN 20) CAPS Take  40 mg by mouth 2 (two) times daily.    NON FORMULARY CPAP   rosuvastatin (CRESTOR) 5 MG tablet Patient takes 5 mg tablet by mouth 3 days weekly on mon, wed, fri   TURMERIC PO Take 1 capsule by mouth 2 (two) times daily.    vitamin B-12 (CYANOCOBALAMIN) 1000 MCG tablet Take 1,000 mcg by mouth daily.   warfarin (COUMADIN) 5 MG tablet TAKE 2 TO 2.5 TABLETS BY MOUTH ONCE DAILY AS DIRECTED BY COUMADIN CLINIC   No current facility-administered medications for this visit. (Other)      REVIEW OF SYSTEMS: ROS   Negative for: Constitutional, Gastrointestinal, Neurological, Skin, Genitourinary, Musculoskeletal, HENT, Endocrine, Cardiovascular, Eyes, Respiratory, Psychiatric, Allergic/Imm, Heme/Lymph Last edited by Silvestre Moment on 12/13/2021  1:32 PM.       ALLERGIES Allergies  Allergen Reactions   Clindamycin/Lincomycin Diarrhea and Other (See Comments)    Stomach pain    Ciprofloxacin Other (See Comments)    Joint pain   Amoxicillin Rash    PAST MEDICAL HISTORY Past Medical History:  Diagnosis Date   Acid reflux    Arthritis    ASD (atrial septal defect)    Asthma with allergic rhinitis and status asthmaticus    NO RECENT FLARE UPS   Atrial fib/flutter, transient    Cancer ([place])  skin   Chronic anticoagulation    Depression    Dysrhythmia    ATRIAL FIB   ENT complaint    Dr. Erik Obey   Erectile dysfunction    Fatty liver    FHx: allergies    Gout    , Possible, Right Knee x2 ; intermittent milder episodes of pain in hte first MTP - uric acid 7.7- no treatment so far.   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   Pneumonia    " walking PNA > 40 years ago"   Prostate cancer ([place]) DX 2018   Seasonal  allergies    Sleep apnea    wears CPAP ( set at 4-11)   Trigger finger, left    , fourth finger   Past Surgical History:  Procedure Laterality Date   CARDIOVERSION N/A 05/29/2016   Procedure: CARDIOVERSION;  Surgeon: [name], MD;  Location: [place];  Service: Cardiovascular;  Laterality: N/A;   CATARACT EXTRACTION W/ INTRAOCULAR LENS  IMPLANT, BILATERAL  2016   COLONOSCOPY WITH PROPOFOL N/A 09/26/2016   Procedure: COLONOSCOPY WITH PROPOFOL;  Surgeon: Wilford Corner, MD;  Location: [place];  Service: Endoscopy;  Laterality: N/A;   CYSTOSCOPY  01/23/2018   Procedure: CYSTOSCOPY FLEXIBLE;  Surgeon: Franchot Gallo, MD;  Location: [facility];  Service: Urology;;  no seeds found in bladder   ESOPHAGOGASTRODUODENOSCOPY (EGD) WITH PROPOFOL N/A 09/26/2016   Procedure: ESOPHAGOGASTRODUODENOSCOPY (EGD) WITH PROPOFOL;  Surgeon: Wilford Corner, MD;  Location: [place];  Service: Endoscopy;  Laterality: N/A;   LIVER BIOPSY  MANY YRS AGO   NASAL SINUS SURGERY  1997   RADIOACTIVE SEED IMPLANT N/A 01/23/2018   Procedure: RADIOACTIVE SEED IMPLANT/BRACHYTHERAPY IMPLANT;  Surgeon: Franchot Gallo, MD;  Location[facility];  Service: Urology;  Laterality: N/A;   69 seeds implanted   SPACE OAR INSTILLATION N/A 01/23/2018   Procedure: SPACE OAR INSTILLATION;  Surgeon: Franchot Gallo, MD;  Location: [facility];  Service: Urology;  Laterality: N/A;    FAMILY HISTORY Family History  Problem Relation Age of Onset   Stroke Father    Diabetes type II Father    Colon cancer Father    Heart disease Mother        with pacemaker   Breast cancer Sister    Diabetes type II Maternal Grandfather    Leukemia Paternal Uncle    Prostate cancer Other    Pancreatic cancer Neg Hx     SOCIAL HISTORY Social History   Tobacco Use   Smoking status: Every Day    Packs/day: 0.25    Years: 50.00    Total pack years: 12.50    Types:  Cigarettes    Start date: 10/30/2013   Smokeless tobacco: Former    Types: Chew   Tobacco comments:    trying to quit 5 TO 6 CIGS PER DAY NOW  Vaping Use   Vaping Use: Former  Substance Use Topics   Alcohol use: Yes    Alcohol/week: 0.0 standard drinks of alcohol    Comment: 2- glasses of red wine per night   Drug use: No  OPHTHALMIC EXAM:  Base Eye Exam     Visual Acuity (ETDRS)       Right Left   Dist [place] 20/20 -2    Dist cc  20/25 -1    Correction: Contacts         Tonometry (Tonopen, 1:41 PM)       Right Left   Pressure 10 14         Pupils       Pupils Dark Light Shape React APD   Right PERRL 4 3 Round Brisk None   Left PERRL 4 3 Round Brisk None         Visual Fields       Left Right    Full Full         Extraocular Movement       Right Left    Full, Ortho Full, Ortho         Neuro/Psych     Oriented x3: Yes   Mood/Affect: Normal         Dilation     Both eyes: 1.0% Mydriacyl, 2.5% Phenylephrine @ 1:41 PM           Slit Lamp and Fundus Exam     External Exam       Right Left   External Normal Normal         Slit Lamp Exam       Right Left   Lids/Lashes Normal Normal   Conjunctiva/Sclera White and quiet White and quiet   Cornea Clear Clear   Anterior Chamber Deep and quiet Deep and quiet   Iris Round and reactive Round and reactive   Lens Posterior chamber intraocular lens Posterior chamber intraocular lens   Anterior Vitreous Normal Normal         Fundus Exam       Right Left   Posterior Vitreous Posterior vitreous detachment Posterior vitreous detachment   Disc Peripapillary atrophy Peripapillary atrophy   C/D Ratio 0.55 0.55   Macula Geographic atrophy, Pigmented atrophy, myopic mac degen, no hemorrhage, no macular thickening Geographic atrophy, Pigmented atrophy   Vessels Normal Normal   Periphery CR scar at 9 o'clock no change, no retinal tear , no holes or tears             IMAGING AND PROCEDURES  Imaging and Procedures for 12/16/21  OCT, Retina - OU - Both Eyes       Right Eye Quality was good. Scan locations included subfoveal. Central Foveal Thickness: 325. Progression has been stable. Findings include myopic contour, subretinal hyper-reflective material.   Left Eye Quality was borderline. Scan locations included subfoveal. Central Foveal Thickness: 341. Progression has been stable. Findings include myopic contour, subretinal hyper-reflective material.   Notes Geographic atrophy peripapillary, otherwise no active CNVM in either eye nor near the fovea     Color Fundus Photography Optos - OU - Both Eyes       Right Eye Progression has been stable. Disc findings include normal observations. Macula : normal observations. Vessels : normal observations. Periphery : normal observations.   Left Eye Progression has been stable. Disc findings include normal observations. Macula : normal observations. Vessels : normal observations. Periphery : normal observations.   Notes Myopic macular degeneration, pigmentary changes in the macular each eye, no evidence of hemorrhagic change, no elevations and no signs of CNVM OU.             ASSESSMENT/PLAN:  Bilateral degenerative progressive high myopia  bilateral myopic macular degeneration, no progression over time and no signs of complications  Early stage nonexudative age-related macular degeneration of both eyes Mild OU no signs of geographic atrophy from age-related maculopathy  Posterior vitreous detachment of both eyes No holes or tears OU.  Pseudophakia of both eyes Stable OU     ICD-10-CM   1. Posterior vitreous detachment of both eyes  H43.813 OCT, Retina - OU - Both Eyes    Color Fundus Photography Optos - OU - Both Eyes    2. Bilateral degenerative progressive high myopia  H44.23     3. Early stage nonexudative age-related macular degeneration of both eyes  H35.3131     4.  Pseudophakia of both eyes  Z96.1       1.  Bilateral PVD no retinal holes or tears  2.  History of degenerative myopic macular degeneration OS worse than OD no signs of complications at present  3.  Ophthalmic Meds Ordered this visit:  No orders of the defined types were placed in this encounter.      Return in about 1 year (around 12/14/2022) for DILATE OU, COLOR FP, OCT.  There are no Patient Instructions on file for this visit.   Explained the diagnoses, plan, and follow up with the patient and they expressed understanding.  Patient expressed understanding of the importance of proper follow up care.   Clent Demark Rynlee [place] M.D. Diseases & Surgery of the Retina and Vitreous [facility]ina [facility] Diabetic [place] 12/16/21     Abbreviations: M myopia (nearsighted); A astigmatism; H hyperopia (farsighted); P presbyopia; Mrx spectacle prescription;  CTL contact lenses; OD right eye; OS left eye; OU both eyes  XT exotropia; ET esotropia; PEK punctate epithelial keratitis; PEE punctate epithelial erosions; DES dry eye syndrome; MGD meibomian gland dysfunction; ATs artificial tears; PFAT's preservative free artificial tears; [place] nuclear sclerotic cataract; PSC posterior subcapsular cataract; ERM epi-retinal membrane; PVD posterior vitreous detachment; RD retinal detachment; DM diabetes mellitus; DR diabetic retinopathy; NPDR non-proliferative diabetic retinopathy; PDR proliferative diabetic retinopathy; CSME clinically significant macular edema; DME diabetic macular edema; dbh dot blot hemorrhages; CWS cotton wool spot; POAG primary open angle glaucoma; C/D cup-to-disc ratio; HVF humphrey visual field; GVF goldmann visual field; OCT optical coherence tomography; IOP intraocular pressure; BRVO Branch retinal vein occlusion; CRVO central retinal vein occlusion; CRAO central retinal artery occlusion; BRAO branch retinal artery occlusion; RT retinal tear; SB scleral buckle; PPV pars plana vitrectomy; VH  Vitreous hemorrhage; PRP panretinal laser photocoagulation; IVK intravitreal kenalog; VMT vitreomacular traction; MH Macular hole;  NVD neovascularization of the disc; NVE neovascularization elsewhere; AREDS age related eye disease study; ARMD age related macular degeneration; POAG primary open angle glaucoma; EBMD epithelial/anterior basement membrane dystrophy; ACIOL anterior chamber intraocular lens; IOL intraocular lens; PCIOL posterior chamber intraocular lens; Phaco/IOL phacoemulsification with intraocular lens placement; [place] photorefractive keratectomy; LASIK laser assisted in situ keratomileusis; HTN hypertension; DM diabetes mellitus; COPD chronic obstructive pulmonary disease

## 2021-12-13 NOTE — Assessment & Plan Note (Signed)
No holes or tears OU.

## 2021-12-17 DIAGNOSIS — Z8546 Personal history of malignant neoplasm of prostate: Secondary | ICD-10-CM | POA: Diagnosis not present

## 2021-12-17 DIAGNOSIS — C61 Malignant neoplasm of prostate: Secondary | ICD-10-CM | POA: Diagnosis not present

## 2021-12-18 DIAGNOSIS — I480 Paroxysmal atrial fibrillation: Secondary | ICD-10-CM | POA: Diagnosis not present

## 2021-12-18 DIAGNOSIS — F17200 Nicotine dependence, unspecified, uncomplicated: Secondary | ICD-10-CM | POA: Diagnosis not present

## 2021-12-18 DIAGNOSIS — Z7901 Long term (current) use of anticoagulants: Secondary | ICD-10-CM | POA: Diagnosis not present

## 2021-12-18 DIAGNOSIS — I10 Essential (primary) hypertension: Secondary | ICD-10-CM | POA: Diagnosis not present

## 2022-01-02 ENCOUNTER — Ambulatory Visit: Payer: PPO | Attending: Cardiology

## 2022-01-02 DIAGNOSIS — Z7901 Long term (current) use of anticoagulants: Secondary | ICD-10-CM

## 2022-01-02 DIAGNOSIS — I480 Paroxysmal atrial fibrillation: Secondary | ICD-10-CM | POA: Diagnosis not present

## 2022-01-02 LAB — POCT INR: INR: 3.1 — AB (ref 2.0–3.0)

## 2022-01-02 NOTE — Patient Instructions (Signed)
Description   Only take 1 tablet today and then START taking Warfarin 2.5 tablets daily except 2 tablets on Mondays, Wednesdays and Saturdays.  Recheck INR in 2 weeks.  Stay consistent with greens each week.  Call if placed on any new medications [phone]

## 2022-01-07 ENCOUNTER — Other Ambulatory Visit: Payer: Self-pay | Admitting: Cardiology

## 2022-01-07 DIAGNOSIS — I480 Paroxysmal atrial fibrillation: Secondary | ICD-10-CM

## 2022-01-14 DIAGNOSIS — L720 Epidermal cyst: Secondary | ICD-10-CM | POA: Diagnosis not present

## 2022-01-14 DIAGNOSIS — Z85828 Personal history of other malignant neoplasm of skin: Secondary | ICD-10-CM | POA: Diagnosis not present

## 2022-01-14 DIAGNOSIS — M720 Palmar fascial fibromatosis [Dupuytren]: Secondary | ICD-10-CM | POA: Diagnosis not present

## 2022-01-14 DIAGNOSIS — L821 Other seborrheic keratosis: Secondary | ICD-10-CM | POA: Diagnosis not present

## 2022-01-14 DIAGNOSIS — L570 Actinic keratosis: Secondary | ICD-10-CM | POA: Diagnosis not present

## 2022-01-17 ENCOUNTER — Ambulatory Visit: Payer: PPO | Attending: Cardiology | Admitting: *Deleted

## 2022-01-17 DIAGNOSIS — Z7901 Long term (current) use of anticoagulants: Secondary | ICD-10-CM

## 2022-01-17 DIAGNOSIS — I480 Paroxysmal atrial fibrillation: Secondary | ICD-10-CM | POA: Diagnosis not present

## 2022-01-17 LAB — POCT INR: INR: 2.4 (ref 2.0–3.0)

## 2022-01-17 NOTE — Patient Instructions (Signed)
Description   Continue taking Warfarin 2.5 tablets daily except 2 tablets on Mondays, Wednesdays and Saturdays. Recheck INR in 3 weeks. Stay consistent with greens each week.  Call if placed on any new medications [phone]

## 2022-01-21 DIAGNOSIS — R7301 Impaired fasting glucose: Secondary | ICD-10-CM | POA: Diagnosis not present

## 2022-01-21 DIAGNOSIS — Z0000 Encounter for general adult medical examination without abnormal findings: Secondary | ICD-10-CM | POA: Diagnosis not present

## 2022-01-21 DIAGNOSIS — I10 Essential (primary) hypertension: Secondary | ICD-10-CM | POA: Diagnosis not present

## 2022-01-21 DIAGNOSIS — I77812 Thoracoabdominal aortic ectasia: Secondary | ICD-10-CM | POA: Diagnosis not present

## 2022-01-21 DIAGNOSIS — M109 Gout, unspecified: Secondary | ICD-10-CM | POA: Diagnosis not present

## 2022-01-21 DIAGNOSIS — Z1331 Encounter for screening for depression: Secondary | ICD-10-CM | POA: Diagnosis not present

## 2022-01-21 DIAGNOSIS — N529 Male erectile dysfunction, unspecified: Secondary | ICD-10-CM | POA: Diagnosis not present

## 2022-01-21 DIAGNOSIS — F17210 Nicotine dependence, cigarettes, uncomplicated: Secondary | ICD-10-CM | POA: Diagnosis not present

## 2022-01-21 DIAGNOSIS — G4733 Obstructive sleep apnea (adult) (pediatric): Secondary | ICD-10-CM | POA: Diagnosis not present

## 2022-01-21 DIAGNOSIS — K219 Gastro-esophageal reflux disease without esophagitis: Secondary | ICD-10-CM | POA: Diagnosis not present

## 2022-01-21 DIAGNOSIS — E559 Vitamin D deficiency, unspecified: Secondary | ICD-10-CM | POA: Diagnosis not present

## 2022-01-21 DIAGNOSIS — F3341 Major depressive disorder, recurrent, in partial remission: Secondary | ICD-10-CM | POA: Diagnosis not present

## 2022-01-30 DIAGNOSIS — M1A09X0 Idiopathic chronic gout, multiple sites, without tophus (tophi): Secondary | ICD-10-CM | POA: Diagnosis not present

## 2022-01-30 DIAGNOSIS — E669 Obesity, unspecified: Secondary | ICD-10-CM | POA: Diagnosis not present

## 2022-01-30 DIAGNOSIS — Z6830 Body mass index (BMI) 30.0-30.9, adult: Secondary | ICD-10-CM | POA: Diagnosis not present

## 2022-01-30 DIAGNOSIS — M1991 Primary osteoarthritis, unspecified site: Secondary | ICD-10-CM | POA: Diagnosis not present

## 2022-01-30 DIAGNOSIS — Z79899 Other long term (current) drug therapy: Secondary | ICD-10-CM | POA: Diagnosis not present

## 2022-02-06 ENCOUNTER — Ambulatory Visit: Payer: PPO | Attending: Cardiology

## 2022-02-06 DIAGNOSIS — Z7901 Long term (current) use of anticoagulants: Secondary | ICD-10-CM

## 2022-02-06 DIAGNOSIS — I480 Paroxysmal atrial fibrillation: Secondary | ICD-10-CM | POA: Diagnosis not present

## 2022-02-06 LAB — POCT INR: INR: 2.9 (ref 2.0–3.0)

## 2022-02-06 NOTE — Patient Instructions (Signed)
Description   Continue taking Warfarin 2.5 tablets daily except 2 tablets on Mondays, Wednesdays and Saturdays. Recheck INR in 4 weeks.  Stay consistent with greens each week.  Call if placed on any new medications [phone]

## 2022-02-25 DIAGNOSIS — H524 Presbyopia: Secondary | ICD-10-CM | POA: Diagnosis not present

## 2022-02-25 DIAGNOSIS — H15833 Staphyloma posticum, bilateral: Secondary | ICD-10-CM | POA: Diagnosis not present

## 2022-02-25 DIAGNOSIS — H353132 Nonexudative age-related macular degeneration, bilateral, intermediate dry stage: Secondary | ICD-10-CM | POA: Diagnosis not present

## 2022-02-25 DIAGNOSIS — H04122 Dry eye syndrome of left lacrimal gland: Secondary | ICD-10-CM | POA: Diagnosis not present

## 2022-02-25 DIAGNOSIS — H02054 Trichiasis without entropian left upper eyelid: Secondary | ICD-10-CM | POA: Diagnosis not present

## 2022-02-25 DIAGNOSIS — H40013 Open angle with borderline findings, low risk, bilateral: Secondary | ICD-10-CM | POA: Diagnosis not present

## 2022-03-06 ENCOUNTER — Ambulatory Visit: Payer: PPO | Attending: Cardiology | Admitting: *Deleted

## 2022-03-06 DIAGNOSIS — Z7901 Long term (current) use of anticoagulants: Secondary | ICD-10-CM | POA: Diagnosis not present

## 2022-03-06 DIAGNOSIS — I480 Paroxysmal atrial fibrillation: Secondary | ICD-10-CM

## 2022-03-06 LAB — POCT INR: INR: 2.8 (ref 2.0–3.0)

## 2022-03-06 NOTE — Patient Instructions (Signed)
Description   Start taking Warfarin 2 tablets daily except 2.5 tablets on Sunday, Tuesday, and Thursday. Recheck INR in 3 weeks. Stay consistent with greens each week.  Call if placed on any new medications [phone]

## 2022-03-20 ENCOUNTER — Other Ambulatory Visit: Payer: Self-pay | Admitting: Gastroenterology

## 2022-03-25 NOTE — Anesthesia Preprocedure Evaluation (Signed)
Anesthesia Evaluation  Patient identified by MRN, date of birth, ID band Patient awake    Reviewed: Allergy & Precautions, NPO status , Patient's Chart, lab work & pertinent test results  Airway Mallampati: II  TM Distance: >3 FB Neck ROM: Full    Dental no notable dental hx. (+) Teeth Intact, Dental Advisory Given   Pulmonary asthma , sleep apnea and Continuous Positive Airway Pressure Ventilation , Current Smoker and Patient abstained from smoking.   Pulmonary exam normal breath sounds clear to auscultation       Cardiovascular hypertension, Pt. on medications + CAD and + Peripheral Vascular Disease  Normal cardiovascular exam+ dysrhythmias Atrial Fibrillation  Rhythm:Regular Rate:Normal  Stress Test 2019  Nuclear stress EF: 55%.  There was no ST segment deviation noted during stress.  The study is normal.  This is a low risk study.  The left ventricular ejection fraction is normal (55-65%).  TTE 2018 Left ventricle: The cavity size was normal. Wall thickness was    increased in a pattern of mild LVH. Systolic function was normal.    The estimated ejection fraction was in the range of 55% to 60%.    Wall motion was normal; there were no regional wall motion    abnormalities. Left ventricular diastolic function parameters    were normal.  - Left atrium: The atrium was moderately dilated.  - Atrial septum: No defect or patent foramen ovale was identified.     Neuro/Psych  PSYCHIATRIC DISORDERS  Depression    negative neurological ROS     GI/Hepatic Neg liver ROS,GERD  ,,  Endo/Other  negative endocrine ROS    Renal/GU negative Renal ROS  negative genitourinary   Musculoskeletal  (+) Arthritis ,    Abdominal   Peds  Hematology  (+) Blood dyscrasia (coumadin)   Anesthesia Other Findings   Reproductive/Obstetrics                             Anesthesia Physical Anesthesia  Plan  ASA: 3  Anesthesia Plan: MAC   Post-op Pain Management:    Induction: Intravenous  PONV Risk Score and Plan: Propofol infusion and Treatment may vary due to age or medical condition  Airway Management Planned: Natural Airway  Additional Equipment:   Intra-op Plan:   Post-operative Plan:   Informed Consent: I have reviewed the patients History and Physical, chart, labs and discussed the procedure including the risks, benefits and alternatives for the proposed anesthesia with the patient or authorized representative who has indicated his/her understanding and acceptance.     Dental advisory given  Plan Discussed with: CRNA  Anesthesia Plan Comments:        Anesthesia Quick Evaluation

## 2022-03-25 NOTE — H&P (Signed)
History of Present Illness General:         Patient is a 77 year old male presenting for repeat colonoscopy        Today patient reports no GI complaints. States in May he had a stomach bug which caused some diarrhea. Would have some abdominal cramping. Now his bowel movements are back to normal. Now having a bowel movement every other day. No straining.        Patient denies CVA, TIA or MI in the last 6 months. Denies pulmonary hypertension, severe valvular heart disease, unstable angina or drug-eluting stent placed in the last 6 months. Denies cardiac episode with last 6-12 months or pulmonary embolus within the last 6 months.        He sees a cardiologist for A-fib. he is taking warfarin. Last Pt/INR monday was 2.7.        Denies nausea, vomiting, constipation, diarrhea, abdominal pain, straining, melena, hematochezia and unintentional weight loss. Family history of colon cancer in his father. Denies NSAID use.        Endoscopy 09/26/2016        Normal stomach, normal esophagus, normal duodenum,        Colonoscopy 09/26/2016        Tubular adenoma, low-grade dysplasia, recall 5 years.  Current Medications Taking Allegra Allergy(Fexofenadine HCl) 180 MG Tablet 1/2 tablet Orally twice a day , Notes to Pharmacist: 1/2 tab po bid Allopurinol 300 MG Tablet 1 tablet Orally Once a day Calcium 600 MG Tablet 2 capsules Orally three times a day Colcrys 0.6 MG Tablet 1 tablet Orally QD-BID as needed for flares As needed, Notes to Pharmacist: prnCPAP as directed Flecainide Acetate 50 MG Tablet 1 tablet Orally every 12 hrs Irbesartan 150 MG Tablet 1 tablet Orally Once a day Lutein-Zeaxanthin 20-1 MG Capsule as directed Orally twice daily Magnesium 300 MG Capsule 2 capsule with a meal Orally twice a day Rosuvastatin Calcium 5 MG Tablet TAKE 1 TABLET BY MOUTH THREE TIMES A WEEK Turmeric 500 MG Capsule 1 capsule Orally twice a day Ventolin HFA(Albuterol Sulfate HFA) 108 (90 Base) MCG/ACT Aerosol Solution  2 puffs as needed Inhalation every 6 hrs Vitamin D3 125 MCG (5000 UT) Capsule 1 capsule Orally Once a day , Notes to Pharmacist: 6000 IUVitamin K2 100 MCG Capsule as directed Orally daily Voltaren(Diclofenac Sodium) 1 % Gel as directed Externally Warfarin Sodium 5 MG Tablet 2 tablets ('10mg'$ ) Orally on Friday and Sunday, and 2.5 tablets (12.'5mg'$ ) all other days , Notes to Pharmacist: 5:30PM-6PMZinc 60 MG Tablet 1 tablet Orally Once a day   Past Medical History      acute carotid dissection (7673) presenting as acute Horner's syndrome.      obst sleep apnea (PSG 2008 AHI 39/hr REM 59/hr, O2 min 70%; CPAP 2008 CPAP 16; PSG 02/05/11 AHI 26/hr REM 67/hr, RDI 33/hr REM 69/hr O2 min 81%; on auto-CPAP; PSG 11/30/14 AHI 13/hr REM 46/hr, RDI 14/hr REM 49/hr, O2 min 78%; CPAP 12/22/14 CPAP 11 with AHI 0).      Hemochromatosis.      Hypercholesterolemia.      Hypertension.      Depression (not on treatment at present).      Allergic rhinitis and asthma (off shots since 2006).      colonoscopy (2004 and 2007 with polyps both times -2013 without polyp - repeat 2018) - Dr. Michail Sermon.      paroxysmal atrial fibrillation 12/2009 - 4/11 NUC - LOW RISK - Dr. Marlou Porch.  Gout affecting right knee x2; intermittent milder episodes of pain in first MTP - uric acid 7.7 - no treatment so far.      Erectile dysfunction.      Trigger finger, left fourth finger.      Hypogonadism, on testosterone cream - irritability on testosterone injections in the past.      ENT - Dr. Erik Obey.      Non obstructing renal stones on Ultrasound 2016.      Aortic ectasia at 2.7cm ([facility] 2016) Need repeat in 2021.      CardRoxy Horseman, PA,  Rheu- Dr. Trudie Reed,  Uro- Dr. Diona Fanti.  Surgical History       sinus surgery       liver bx       colonoscopy 05/2011,09/2016       EGD 05/2011,09/2016       Brachytherapy, 28 seeds in prostate 01/2018   Family History Father: deceased, HTN, DM, CABG, colon cancer, diagnosed with Diabetes, Hypertension,  Coronary artery disease, Colon cancer  Mother: deceased 65 yrs, HTN, glaucoma, OA, died suddenly, diagnosed with Hypertension Sister 1: alive, A + WSister 2: alive, A + WSister 3: alive, A + Wno autoimmune diseaseneg fam hx colon polyps or liver diseasefather had colon cancer.  Social History General: Tobacco use  cigarettes:  Current smoker 4-5 cigarettes a day., Frequency:  1/2 PPD, Tobacco history last updated  03/20/2022, Additional Findings: Tobacco User  Light cigarette smoker ((1-9 cigs/day), Vaping  No. EXPOSURE TO PASSIVE SMOKE: no, no.  Alcohol: yes, 1/2 bottle of wine nightly.  Caffeine: yes.  Recreational drug use: no.  Exercise: gym daily (recumbant bike, treadmill), strength training with trainer twice a week.  Marital Status: single.  OCCUPATION: retired from [employer]; went back to work and was released in 1/12.  Allergies Amoxicillin: hives - Allergy  Hospitalization/Major Diagnostic Procedure not in the past yr 08/2019 nose bleed - ED only 04/2019 not in the past year 03/21/2021  Review of Systems GI PROCEDURE:        Pacemaker/ AICD no.  Artificial heart valves no.  MI/heart attack no.  Abnormal heart rhythm no.  Angina no.  CVA no.  Hypertension YES        .  Hypotension no.  Asthma, COPD no.  Sleep apnea YES , using CPAP.  Seizure disorders no.  Artificial joints no.  Diabetes no.  Significant headaches no.  Vertigo YES        .  Depression/anxiety YES        .  Abnormal bleeding YES , Taking blood thinners, WARFARIN.  Kidney Disease no.  Liver disease YES        .  Chance of pregnancy no.  Blood transfusion no  Vital Signs Wt 220.6, Wt change 7.6 lbs, Ht 73, BMI 29.1, Temp 97.7, Pulse sitting 45, BP sitting 96/58 2nd BP 92/53.  Examination Gastroenterology Exam:        GENERAL APPEARANCE: Well developed, well nourished, no active distress, pleasant, no acute distress.         RESPIRATORY Breath sounds normal. Respiration even and unlabored.          CARDIOVASCULAR Normal RRR w/o murmers or gallops. No peripheral edema.         ABDOMEN No masses palpated. Liver and spleen not palpated, normal. Bowel sounds normal, Abdomen not distended.         EXTREMITIES: No edema, pulses intact.         SKIN Warm  and dry, good turgor without rashes.   Assessments 1. Personal history of colonic polyps - Z86.010 (Primary) 2. Long term (current) use of anticoagulants - Z79.01, due to a-fib, managed by Dr. Marlou Porch cardiology Treatment 1. Personal history of colonic polyps       IMAGING: Colonoscopy             Francisco Capuchin 09/26/2021 11:53:10 AM > will get clearance first. Action created to give pt a call when November schedule is out.  Notes: Patient is scheduled for colonoscopy. I thoroughly discussed the procedure with the patient including but not limited to nature, alternatives, benefits and risks (including but not limited to bleeding, infection, perforation, anesthesia/cardiopulmonary complications. All questions were answered and the patient acknowledges these risks and wishes to proceed with colonoscopy.   Clinical Notes: Will request cardiac clearance, wafarin hold, will schedule for hospital, his last colonoscopy was in the hospital, he has a-fib managed on warfarin.    2. Long term (current) use of anticoagulants  Clinical Notes: will request warfarin hold.

## 2022-03-26 ENCOUNTER — Telehealth: Payer: Self-pay | Admitting: *Deleted

## 2022-03-26 ENCOUNTER — Encounter (HOSPITAL_COMMUNITY)
Admission: RE | Disposition: A | Discharge status: Home / Self Care | Payer: Self-pay | Source: Home / Self Care | Attending: Gastroenterology

## 2022-03-26 ENCOUNTER — Ambulatory Visit (HOSPITAL_COMMUNITY): Payer: PPO | Admitting: Certified Registered"

## 2022-03-26 ENCOUNTER — Ambulatory Visit (HOSPITAL_COMMUNITY)
Admission: RE | Admit: 2022-03-26 | Discharge: 2022-03-26 | Disposition: A | Discharge status: Home / Self Care | Payer: PPO | Attending: Gastroenterology | Admitting: Gastroenterology

## 2022-03-26 ENCOUNTER — Other Ambulatory Visit: Payer: Self-pay

## 2022-03-26 ENCOUNTER — Ambulatory Visit (HOSPITAL_BASED_OUTPATIENT_CLINIC_OR_DEPARTMENT_OTHER): Payer: PPO | Admitting: Certified Registered"

## 2022-03-26 ENCOUNTER — Encounter (HOSPITAL_COMMUNITY): Payer: Self-pay | Admitting: Gastroenterology

## 2022-03-26 DIAGNOSIS — K621 Rectal polyp: Secondary | ICD-10-CM | POA: Diagnosis not present

## 2022-03-26 DIAGNOSIS — K635 Polyp of colon: Secondary | ICD-10-CM | POA: Diagnosis not present

## 2022-03-26 DIAGNOSIS — I4891 Unspecified atrial fibrillation: Secondary | ICD-10-CM

## 2022-03-26 DIAGNOSIS — Z86010 Personal history of colonic polyps: Secondary | ICD-10-CM | POA: Insufficient documentation

## 2022-03-26 DIAGNOSIS — Z800 Family history of malignant neoplasm of digestive organs: Secondary | ICD-10-CM | POA: Diagnosis not present

## 2022-03-26 DIAGNOSIS — I739 Peripheral vascular disease, unspecified: Secondary | ICD-10-CM | POA: Diagnosis not present

## 2022-03-26 DIAGNOSIS — Z8249 Family history of ischemic heart disease and other diseases of the circulatory system: Secondary | ICD-10-CM | POA: Insufficient documentation

## 2022-03-26 DIAGNOSIS — F17210 Nicotine dependence, cigarettes, uncomplicated: Secondary | ICD-10-CM | POA: Insufficient documentation

## 2022-03-26 DIAGNOSIS — Z1211 Encounter for screening for malignant neoplasm of colon: Secondary | ICD-10-CM | POA: Insufficient documentation

## 2022-03-26 DIAGNOSIS — K5730 Diverticulosis of large intestine without perforation or abscess without bleeding: Secondary | ICD-10-CM | POA: Insufficient documentation

## 2022-03-26 DIAGNOSIS — D128 Benign neoplasm of rectum: Secondary | ICD-10-CM

## 2022-03-26 DIAGNOSIS — K6389 Other specified diseases of intestine: Secondary | ICD-10-CM | POA: Diagnosis not present

## 2022-03-26 DIAGNOSIS — Z7901 Long term (current) use of anticoagulants: Secondary | ICD-10-CM | POA: Diagnosis not present

## 2022-03-26 DIAGNOSIS — K219 Gastro-esophageal reflux disease without esophagitis: Secondary | ICD-10-CM | POA: Insufficient documentation

## 2022-03-26 DIAGNOSIS — G4730 Sleep apnea, unspecified: Secondary | ICD-10-CM | POA: Diagnosis not present

## 2022-03-26 DIAGNOSIS — J45909 Unspecified asthma, uncomplicated: Secondary | ICD-10-CM | POA: Insufficient documentation

## 2022-03-26 DIAGNOSIS — Z83719 Family history of colon polyps, unspecified: Secondary | ICD-10-CM | POA: Diagnosis not present

## 2022-03-26 DIAGNOSIS — K648 Other hemorrhoids: Secondary | ICD-10-CM | POA: Diagnosis not present

## 2022-03-26 DIAGNOSIS — K6289 Other specified diseases of anus and rectum: Secondary | ICD-10-CM | POA: Diagnosis not present

## 2022-03-26 DIAGNOSIS — D125 Benign neoplasm of sigmoid colon: Secondary | ICD-10-CM | POA: Diagnosis not present

## 2022-03-26 HISTORY — PX: COLONOSCOPY WITH PROPOFOL: SHX5780

## 2022-03-26 HISTORY — PX: POLYPECTOMY: SHX5525

## 2022-03-26 SURGERY — COLONOSCOPY WITH PROPOFOL
Anesthesia: Monitor Anesthesia Care

## 2022-03-26 MED ORDER — PROPOFOL 500 MG/50ML IV EMUL
INTRAVENOUS | Status: AC
  Filled 2022-03-26: qty 50

## 2022-03-26 MED ORDER — PROPOFOL 1000 MG/100ML IV EMUL
INTRAVENOUS | Status: AC
  Filled 2022-03-26: qty 100

## 2022-03-26 MED ORDER — PROPOFOL 10 MG/ML IV BOLUS
INTRAVENOUS | Status: DC | PRN
  Administered 2022-03-26: 20 mg via INTRAVENOUS
  Administered 2022-03-26: 30 mg via INTRAVENOUS

## 2022-03-26 MED ORDER — LACTATED RINGERS IV SOLN
INTRAVENOUS | Status: DC
  Administered 2022-03-26: 1000 mL via INTRAVENOUS

## 2022-03-26 MED ORDER — PROPOFOL 500 MG/50ML IV EMUL
INTRAVENOUS | Status: DC | PRN
  Administered 2022-03-26: 175 ug/kg/min via INTRAVENOUS

## 2022-03-26 MED ORDER — LIDOCAINE HCL (CARDIAC) PF 100 MG/5ML IV SOSY
PREFILLED_SYRINGE | INTRAVENOUS | Status: DC | PRN
  Administered 2022-03-26: 60 mg via INTRAVENOUS
  Administered 2022-03-26: 40 mg via INTRAVENOUS

## 2022-03-26 MED ORDER — DEXMEDETOMIDINE HCL IN NACL 80 MCG/20ML IV SOLN
INTRAVENOUS | Status: AC
  Filled 2022-03-26: qty 20

## 2022-03-26 SURGICAL SUPPLY — 22 items

## 2022-03-26 NOTE — Op Note (Signed)
[facility] Patient Name: Jonathan Vargas Procedure Date: 03/26/2022 MRN: [id number] Attending MD: Ronnette Juniper , MD, [id number] Date of Birth: 1945-09-30 CSN: [id number] Age: 77 Admit Type: Outpatient Procedure:                Colonoscopy Indications:              Surveillance: Personal history of adenomatous                            polyps on last colonoscopy > 5 years ago, Last                            colonoscopy: 2018, Family history of colon                            cancer-Father Providers:                Ronnette Juniper, MD, Adah Perl RN, RN,                            William Dalton, Technician Referring MD:             Rica Records Medicines:                Monitored Anesthesia Care Complications:            No immediate complications. Estimated blood loss:                            Minimal. Estimated Blood Loss:     Estimated blood loss was minimal. Procedure:                Pre-Anesthesia Assessment:                           - Prior to the procedure, a History and Physical                            was performed, and patient medications and                            allergies were reviewed. The patient's tolerance of                            previous anesthesia was also reviewed. The risks                            and benefits of the procedure and the sedation                            options and risks were discussed with the patient.                            All questions were answered, and informed consent                            was obtained. Prior Anticoagulants: The  patient has                            taken Coumadin (warfarin), last dose was 5 days                            prior to procedure. ASA Grade Assessment: III - A                            patient with severe systemic disease. After                            reviewing the risks and benefits, the patient was                            deemed in  satisfactory condition to undergo the                            procedure.                           After obtaining informed consent, the colonoscope                            was passed under direct vision. Throughout the                            procedure, the patient's blood pressure, pulse, and                            oxygen saturations were monitored continuously. The                            [id number]-HQ190L ([id number]) Olympus colonoscope was                            introduced through the anus and advanced to the the                            terminal ileum. The colonoscopy was performed                            without difficulty. The patient tolerated the                            procedure well. The quality of the bowel                            preparation was adequate to identify polyps greater                            than 5 mm in size. The terminal ileum, ileocecal  valve, appendiceal orifice, and rectum were                            photographed. Scope In: 7:36:33 AM Scope Out: 0:97:35 AM Scope Withdrawal Time: 0 hours 16 minutes 37 seconds  Total Procedure Duration: 0 hours 20 minutes 1 second  Findings:      Hemorrhoids were found on perianal exam.      The terminal ileum appeared normal.      Five sessile polyps were found in the rectum and sigmoid colon. The       polyps were 4 to 5 mm in size. These polyps were removed with a       piecemeal technique using a cold biopsy forceps. Resection and retrieval       were complete.      A few medium-mouthed and small-mouthed diverticula were found in the       sigmoid colon and descending colon.      Non-bleeding internal hemorrhoids were found during retroflexion.      The exam was otherwise without abnormality. Impression:               - Hemorrhoids found on perianal exam.                           - The examined portion of the ileum was normal.                           - Five 4  to 5 mm polyps in the rectum and in the                            sigmoid colon, removed piecemeal using a cold                            biopsy forceps. Resected and retrieved.                           - Diverticulosis in the sigmoid colon and in the                            descending colon.                           - Non-bleeding internal hemorrhoids.                           - The examination was otherwise normal. Moderate Sedation:      Patient did not receive moderate sedation for this procedure, but       instead received monitored anesthesia care. Recommendation:           - Patient has a contact number available for                            emergencies. The signs and symptoms of potential                            delayed complications were discussed with the  patient. Return to normal activities tomorrow.                            Written discharge instructions were provided to the                            patient.                           - High fiber diet.                           - Continue present medications.                           - Await pathology results.                           - Repeat colonoscopy for surveillance based on                            pathology results.                           - Resume Coumadin (warfarin) at prior dose tomorrow. Procedure Code(s):        --- Professional ---                           [phone], Colonoscopy, flexible; with biopsy, single                            or multiple Diagnosis Code(s):        --- Professional ---                           Z86.010, Personal history of colonic polyps                           K64.8, Other hemorrhoids                           D12.8, Benign neoplasm of rectum                           D12.5, Benign neoplasm of sigmoid colon                           Z80.0, Family history of malignant neoplasm of                            digestive organs                            K57.30, Diverticulosis of large intestine without                            perforation or abscess without bleeding CPT copyright 2022 American Medical Association. All rights reserved. The  codes documented in this report are preliminary and upon coder review may  be revised to meet current compliance requirements. Ronnette Juniper, MD 03/26/2022 8:04:15 AM This report has been signed electronically. Number of Addenda: 0

## 2022-03-26 NOTE — Transfer of Care (Signed)
Immediate Anesthesia Transfer of Care Note  Patient: Jonathan Vargas  Procedure(s) Performed: COLONOSCOPY WITH PROPOFOL POLYPECTOMY  Patient Location: PACU and Endoscopy Unit  Anesthesia Type:MAC  Level of Consciousness: drowsy  Airway & Oxygen Therapy: Patient Spontanous Breathing and Patient connected to nasal cannula oxygen  Post-op Assessment: Report given to RN and Post -op Vital signs reviewed and stable  Post vital signs: Reviewed and stable  Last Vitals:  Vitals Value Taken Time  BP 88/42 03/26/22 0800  Temp    Pulse 44 03/26/22 0801  Resp 16 03/26/22 0801  SpO2 94 % 03/26/22 0801  Vitals shown include unvalidated device data.  Last Pain:  Vitals:   03/26/22 0646  TempSrc: Tympanic  PainSc: 0-No pain         Complications: No notable events documented.

## 2022-03-26 NOTE — Telephone Encounter (Signed)
Pt called and stated he just had his colonoscopy and needed to know what to do. Advised that he should resume once MD states and he states the GI MD stated to restart tomorrow. Advised that when he does resume warfarin he needs to take an extra 1/2 tablet for doses with normal dose. He verbalized understanding. Also, moved pt appt out since it is currently on Friday and he is just resuming warfarin. He verbalized understanding.

## 2022-03-26 NOTE — Discharge Instructions (Signed)

## 2022-03-26 NOTE — Anesthesia Postprocedure Evaluation (Signed)
Anesthesia Post Note  Patient: Jonathan Vargas  Procedure(s) Performed: COLONOSCOPY WITH PROPOFOL POLYPECTOMY     Patient location during evaluation: Endoscopy Anesthesia Type: MAC Level of consciousness: awake and alert Pain management: pain level controlled Vital Signs Assessment: post-procedure vital signs reviewed and stable Respiratory status: spontaneous breathing, nonlabored ventilation, respiratory function stable and patient connected to nasal cannula oxygen Cardiovascular status: blood pressure returned to baseline and stable Postop Assessment: no apparent nausea or vomiting Anesthetic complications: no  No notable events documented.  Last Vitals:  Vitals:   03/26/22 0814 03/26/22 0828  BP: 95/60   Pulse: (!) 51 (!) 47  Resp: 12   Temp:    SpO2: 99% 97%    Last Pain:  Vitals:   03/26/22 0808  TempSrc:   PainSc: 0-No pain                 Chana Lindstrom L Efraim Vanallen

## 2022-03-27 LAB — SURGICAL PATHOLOGY

## 2022-03-28 ENCOUNTER — Encounter (HOSPITAL_COMMUNITY): Payer: Self-pay | Admitting: Gastroenterology

## 2022-04-02 ENCOUNTER — Ambulatory Visit: Payer: PPO | Attending: Cardiology | Admitting: *Deleted

## 2022-04-02 DIAGNOSIS — I480 Paroxysmal atrial fibrillation: Secondary | ICD-10-CM | POA: Diagnosis not present

## 2022-04-02 DIAGNOSIS — Z7901 Long term (current) use of anticoagulants: Secondary | ICD-10-CM

## 2022-04-02 LAB — POCT INR: INR: 1.7 — AB (ref 2.0–3.0)

## 2022-04-02 NOTE — Patient Instructions (Signed)
Description   Today take 3 tablets of warfarin to continue taking Warfarin 2 tablets daily except 2.5 tablets on Sunday, Tuesday, and Thursday. Recheck INR in 2 weeks. Stay consistent with greens each week.  Call if placed on any new medications [phone]

## 2022-04-05 ENCOUNTER — Other Ambulatory Visit (HOSPITAL_COMMUNITY): Payer: Self-pay | Admitting: *Deleted

## 2022-04-08 ENCOUNTER — Ambulatory Visit (HOSPITAL_COMMUNITY)
Admission: RE | Admit: 2022-04-08 | Discharge: 2022-04-08 | Disposition: A | Discharge status: Home / Self Care | Payer: PPO | Source: Ambulatory Visit | Attending: Internal Medicine | Admitting: Internal Medicine

## 2022-04-08 LAB — POCT HEMOGLOBIN-HEMACUE: Hemoglobin: 14.2 g/dL (ref 13.0–17.0)

## 2022-04-08 MED ORDER — LIDOCAINE HCL (PF) 1 % IJ SOLN
INTRAMUSCULAR | Status: AC
  Filled 2022-04-08: qty 2

## 2022-04-09 ENCOUNTER — Encounter: Payer: Self-pay | Admitting: Cardiology

## 2022-04-11 DIAGNOSIS — G4733 Obstructive sleep apnea (adult) (pediatric): Secondary | ICD-10-CM | POA: Diagnosis not present

## 2022-04-16 NOTE — Progress Notes (Unsigned)
Cardiology Office Note:    Date:  04/17/2022   ID:  Jonathan Vargas, DOB 1945-05-02, MRN [id number]  PCP:  Leeroy Cha, [place] Providers Cardiologist:  Candee Furbish, MD Cardiology APP:  Sharm[facility]vere     Referring MD: Leeroy Cha,*   Patient Profile: Paroxysmal atrial fibrillation/flutter Admx 2012 w/ AFlutter, conv on Dilt w 8.2 post-term pause // S/p DCCV in 3/18 Warfarin anticoagulation; Flecainide Rx  TTE 06/12/2017: Mild LVH, EF 55-60, normal wall motion, moderate LAE ZIO monitor 07/2019: NSR, no AFib, rare PAT/PVCs/PACs; brief idioventricular rhythm during sleep MYOVIEW 01/07/2018: EF 55, normal perfusion; low risk Hypertension  Hyperlipidemia (intol of statins) 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 adult with the above problem list.  He was last seen 10/03/21. He returns for f/u on AFib. He is here alone. He had a hard time sleeping last night. He was also frustrated this AM b/c his newspaper got wet. He has occ palpitations that are brief. Otherwise he has not had sustained rapid palpitations. He does get dizzy at times. This has been ongoing for years without change. He has not had syncope. He has not had chest pain, shortness of breath. He has leg edema. This has been chronic. He has tried Furosemide in the past w/o much improvement. It seems to be better if he avoids salt.   EKG: sinus brady, HR 46, LaD, no ST-TW changes, QTc 390 ms, no changes     Reviewed and updated this encounter:   Tobacco  Allergies  Meds  Problems  Med Hx  Surg Hx  Fam Hx     Review of Systems  Gastrointestinal:  Negative for hematochezia and melena.  Genitourinary:  Negative for hematuria.     Labs/Other Test Reviewed:   Recent Labs: 04/08/2022: Hemoglobin 14.2   Recent Lipid Panel No results for input(s): "CHOL", "TRIG", "HDL", "VLDL", "LDLCALC", "LDLDIRECT" in the last 8760 hours.   Risk Assessment/Calculations/Metrics:    CHA2DS2-VASc Score = 4   This indicates a 4.8% annual risk of stroke. The patient's score is based upon: CHF History: 0 HTN History: 1 Diabetes History: 0 Stroke History: 0 Vascular Disease History: 1 Age Score: 2 Gender Score: 0        HYPERTENSION CONTROL Vitals:   04/17/22 1010 04/17/22 1339  BP: (!) 152/90 (!) 140/76    The patient's blood pressure is elevated above target today.  In order to address the patient's elevated BP: Blood pressure will be monitored at home to determine if medication changes need to be made.      Physical Exam:   VS:  BP (!) 140/76   Pulse (!) 53   Ht '6\' 1"'$  (1.854 m)   Wt 223 lb 9.6 oz (101.4 kg)   SpO2 98%   BMI 29.50 kg/m    Wt Readings from Last 3 Encounters:  04/17/22 223 lb 9.6 oz (101.4 kg)  03/26/22 221 lb 9 oz (100.5 kg)  10/03/21 221 lb 9.6 oz (100.5 kg)    Constitutional:      Appearance: Healthy appearance. Not in distress.  Neck:     Vascular: JVD normal.  Pulmonary:     Breath sounds: Normal breath sounds. No wheezing.  No rales.  Cardiovascular:     Bradycardia present. Regular rhythm. Normal S1. Normal S2.      Murmurs: There is no murmur.  Edema:    Peripheral edema present.    Pretibial: bilateral trace edema of the pretibial area.    Ankle: bilateral 1+ edema of the ankle.        ASSESSMENT & PLAN:   Paroxysmal atrial fibrillation (HCC) Maintaining NSR. He is followed in our coumadin clinic. INR today is 2.5. He has a long hx of bradycardia. Therefore he is not on rate controlling medications. He is overall stable on his current dose of Flecainide. He did have post termination pause in 2012 with IV diltiazem. He notes occasional dizziness but no syncope. We discussed +/-  proceeding with a monitor. However, since his symptoms have remained stable, I think we can hold off. He knows to contact me if he notices more frequent dizziness or worsening symptoms. QRS 108, QTc 390 on EKG today.  Continue coumadin with f/u in the Coumadin Clinic Continue Flecainide 50 mg twice daily  If dizziness changes/worse>>arrange Zio AT  Essential hypertension, benign BP elevated today. He usually has well controlled BPs. Continue Irbesartan 150 mg once daily. Monitor BP at home and notify me if running too high.   Pure hypercholesterolemia Managed by primary care.  Aortic atherosclerosis ([place]) He is not on ASA as he is on Coumadin. Continue Rosuvastatin 5 mg MWF.  Coronary artery calcification Pt is not having angina. Continue Rosuvastatin 5 mg MWF.   Tobacco use disorder He is not interested in quitting. I have recommended cessation.           Dispo:  Return in about 6 months (around 10/16/2022) for Routine Follow Up w/ Dr. Marlou Porch, or Richardson Dopp, PA-C.   Signed, Richardson Dopp, PA-C  04/17/2022 1:53 PM    [address], [place], [place]  [address] Phone: [phone]; Fax: [phone]

## 2022-04-17 ENCOUNTER — Ambulatory Visit: Payer: PPO | Attending: Cardiology | Admitting: *Deleted

## 2022-04-17 ENCOUNTER — Encounter: Payer: Self-pay | Admitting: Physician Assistant

## 2022-04-17 ENCOUNTER — Ambulatory Visit (INDEPENDENT_AMBULATORY_CARE_PROVIDER_SITE_OTHER): Payer: PPO | Admitting: Physician Assistant

## 2022-04-17 VITALS — BP 140/76 | HR 53 | Ht 73.0 in | Wt 223.6 lb

## 2022-04-17 DIAGNOSIS — F17200 Nicotine dependence, unspecified, uncomplicated: Secondary | ICD-10-CM

## 2022-04-17 DIAGNOSIS — Z7901 Long term (current) use of anticoagulants: Secondary | ICD-10-CM | POA: Diagnosis not present

## 2022-04-17 DIAGNOSIS — E7800 Pure hypercholesterolemia, unspecified: Secondary | ICD-10-CM

## 2022-04-17 DIAGNOSIS — I2510 Atherosclerotic heart disease of native coronary artery without angina pectoris: Secondary | ICD-10-CM

## 2022-04-17 DIAGNOSIS — I10 Essential (primary) hypertension: Secondary | ICD-10-CM

## 2022-04-17 DIAGNOSIS — I2584 Coronary atherosclerosis due to calcified coronary lesion: Secondary | ICD-10-CM

## 2022-04-17 DIAGNOSIS — I700 Atherosclerosis of aorta: Secondary | ICD-10-CM | POA: Diagnosis not present

## 2022-04-17 DIAGNOSIS — I480 Paroxysmal atrial fibrillation: Secondary | ICD-10-CM

## 2022-04-17 LAB — POCT INR: INR: 2.5 (ref 2.0–3.0)

## 2022-04-17 NOTE — Assessment & Plan Note (Signed)
Managed by primary care. 

## 2022-04-17 NOTE — Assessment & Plan Note (Signed)
BP elevated today. He usually has well controlled BPs. Continue Irbesartan 150 mg once daily. Monitor BP at home and notify me if running too high.

## 2022-04-17 NOTE — Assessment & Plan Note (Signed)
Pt is not having angina. Continue Rosuvastatin 5 mg MWF.

## 2022-04-17 NOTE — Patient Instructions (Signed)
Medication Instructions:  Your physician recommends that you continue on your current medications as directed. Please refer to the Current Medication list given to you today.  *If you need a refill on your cardiac medications before your next appointment, please call your pharmacy*   Lab Work: None ordered  If you have labs (blood work) drawn today and your tests are completely normal, you will receive your results only by: [place] (if you have MyChart) OR A paper copy in the mail If you have any lab test that is abnormal or we need to change your treatment, we will call you to review the results.   Testing/Procedures: None ordered   Follow-Up: At [facility], you and your health needs are our priority.  As part of our continuing mission to provide you with exceptional heart care, we have created designated Provider Care Teams.  These Care Teams include your primary Cardiologist (physician) and Advanced Practice Providers (APPs -  Physician Assistants and Nurse Practitioners) who all work together to provide you with the care you need, when you need it.  We recommend signing up for the patient portal called "MyChart".  Sign up information is provided on this After Visit Summary.  MyChart is used to connect with patients for Virtual Visits (Telemedicine).  Patients are able to view lab/test results, encounter notes, upcoming appointments, etc.  Non-urgent messages can be sent to your provider as well.   To learn more about what you can do with MyChart, go to [email or web address].    Your next appointment:   6 month(s)  Provider:   Candee Furbish, MD  or Richardson Dopp, PA-C         Other Instructions Your physician has requested that you regularly monitor and record your blood pressure readings at home. Please use the same machine at the same time of day to check your readings and record them to bring to your follow-up visit.   Please monitor blood pressures and keep  a log of your readings a couple of times and let me know if it is over 140/90.    Make sure to check 2 hours after your medications.    AVOID these things for 30 minutes before checking your blood pressure: No Drinking caffeine. No Drinking alcohol. No Eating. No Smoking. No Exercising.   Five minutes before checking your blood pressure: Pee. Sit in a dining chair. Avoid sitting in a soft couch or armchair. Be quiet. Do not talk

## 2022-04-17 NOTE — Assessment & Plan Note (Addendum)
Maintaining NSR. He is followed in our coumadin clinic. INR today is 2.5. He has a long hx of bradycardia. Therefore he is not on rate controlling medications. He is overall stable on his current dose of Flecainide. He did have post termination pause in 2012 with IV diltiazem. He notes occasional dizziness but no syncope. We discussed +/- proceeding with a monitor. However, since his symptoms have remained stable, I think we can hold off. He knows to contact me if he notices more frequent dizziness or worsening symptoms. QRS 108, QTc 390 on EKG today.  Continue coumadin with f/u in the Coumadin Clinic Continue Flecainide 50 mg twice daily  If dizziness changes/worse>>arrange Zio AT

## 2022-04-17 NOTE — Assessment & Plan Note (Signed)
He is not interested in quitting. I have recommended cessation.

## 2022-04-17 NOTE — Patient Instructions (Addendum)
Description   Continue taking Warfarin 2 tablets daily except 2.5 tablets on Sunday, Tuesday, and Thursday. Recheck INR in 3 weeks. Stay consistent with greens each week.  Call if placed on any new medications [phone]

## 2022-04-17 NOTE — Assessment & Plan Note (Signed)
He is not on ASA as he is on Coumadin. Continue Rosuvastatin 5 mg MWF.

## 2022-04-29 DIAGNOSIS — I10 Essential (primary) hypertension: Secondary | ICD-10-CM

## 2022-05-01 ENCOUNTER — Other Ambulatory Visit: Payer: Self-pay | Admitting: *Deleted

## 2022-05-01 DIAGNOSIS — I10 Essential (primary) hypertension: Secondary | ICD-10-CM

## 2022-05-01 MED ORDER — HYDROCHLOROTHIAZIDE 12.5 MG PO CAPS: 12.5000 mg | ORAL_CAPSULE | Freq: Every day | ORAL | 3 refills | Status: DC

## 2022-05-01 NOTE — Telephone Encounter (Signed)
Start HCTZ 12.5 mg once daily BMET 1 week Richardson Dopp, PA-C    05/01/2022 10:27 AM

## 2022-05-08 ENCOUNTER — Ambulatory Visit: Payer: PPO | Attending: Cardiology

## 2022-05-08 DIAGNOSIS — I480 Paroxysmal atrial fibrillation: Secondary | ICD-10-CM

## 2022-05-08 DIAGNOSIS — I10 Essential (primary) hypertension: Secondary | ICD-10-CM | POA: Diagnosis not present

## 2022-05-08 DIAGNOSIS — Z7901 Long term (current) use of anticoagulants: Secondary | ICD-10-CM

## 2022-05-08 LAB — BASIC METABOLIC PANEL
BUN/Creatinine Ratio: 25 — ABNORMAL HIGH (ref 10–24)
BUN: 31 mg/dL — ABNORMAL HIGH (ref 8–27)
CO2: 25 mmol/L (ref 20–29)
Calcium: 9 mg/dL (ref 8.6–10.2)
Chloride: 93 mmol/L — ABNORMAL LOW (ref 96–106)
Creatinine, Ser: 1.22 mg/dL (ref 0.76–1.27)
Glucose: 96 mg/dL (ref 70–99)
Potassium: 5.2 mmol/L (ref 3.5–5.2)
Sodium: 131 mmol/L — ABNORMAL LOW (ref 134–144)
eGFR: 61 mL/min/{1.73_m2} (ref >59)

## 2022-05-08 LAB — POCT INR: INR: 2.4 (ref 2.0–3.0)

## 2022-05-08 NOTE — Patient Instructions (Signed)
Description   Continue taking Warfarin 2 tablets daily except 2.5 tablets on Sunday, Tuesday, and Thursday. Recheck INR in 4 weeks.  Stay consistent with greens each week.  Call if placed on any new medications [phone]

## 2022-05-09 ENCOUNTER — Telehealth: Payer: Self-pay | Admitting: *Deleted

## 2022-05-09 DIAGNOSIS — Z79899 Other long term (current) drug therapy: Secondary | ICD-10-CM

## 2022-05-09 MED ORDER — HYDROCHLOROTHIAZIDE 12.5 MG PO CAPS: 12.5000 mg | ORAL_CAPSULE | ORAL | 0 refills | Status: DC

## 2022-05-09 NOTE — Telephone Encounter (Signed)
-----   Message from Liliane Shi, [place] sent at 05/09/2022  8:31 AM EST ----- Results sent to [facility] via [employer]. See MyChart comment. PLAN:  - Decrease HCTZ to 12.5 mg every Mon, Wed, Fri only - BMET in 2 weeks  Jonathan Vargas  Your creatinine (kidney function) and potassium are normal. Your sodium is lower and the other kidney function test (BUN) is higher since starting on HCTZ. I would like to reduce this to just 3 days a week and recheck labs in 2 weeks. So, decrease Hydrochlorothiazide to 12.5 mg on every Monday, Wednesday, Friday only.  Richardson Dopp, PA-C    05/09/2022 7:59 AM

## 2022-05-21 DIAGNOSIS — J4520 Mild intermittent asthma, uncomplicated: Secondary | ICD-10-CM | POA: Diagnosis not present

## 2022-05-21 DIAGNOSIS — I480 Paroxysmal atrial fibrillation: Secondary | ICD-10-CM | POA: Diagnosis not present

## 2022-05-21 DIAGNOSIS — I10 Essential (primary) hypertension: Secondary | ICD-10-CM | POA: Diagnosis not present

## 2022-05-24 ENCOUNTER — Ambulatory Visit: Payer: PPO | Attending: Student

## 2022-05-24 DIAGNOSIS — Z79899 Other long term (current) drug therapy: Secondary | ICD-10-CM | POA: Diagnosis not present

## 2022-05-25 LAB — BASIC METABOLIC PANEL
BUN/Creatinine Ratio: 27 — ABNORMAL HIGH (ref 10–24)
BUN: 26 mg/dL (ref 8–27)
CO2: 20 mmol/L (ref 20–29)
Calcium: 8.7 mg/dL (ref 8.6–10.2)
Chloride: 97 mmol/L (ref 96–106)
Creatinine, Ser: 0.98 mg/dL (ref 0.76–1.27)
Glucose: 85 mg/dL (ref 70–99)
Potassium: 4.7 mmol/L (ref 3.5–5.2)
Sodium: 132 mmol/L — ABNORMAL LOW (ref 134–144)
eGFR: 79 mL/min/{1.73_m2} (ref >59)

## 2022-06-05 ENCOUNTER — Ambulatory Visit: Payer: PPO | Attending: Cardiology

## 2022-06-05 DIAGNOSIS — I480 Paroxysmal atrial fibrillation: Secondary | ICD-10-CM | POA: Diagnosis not present

## 2022-06-05 DIAGNOSIS — Z7901 Long term (current) use of anticoagulants: Secondary | ICD-10-CM | POA: Diagnosis not present

## 2022-06-05 LAB — POCT INR: INR: 1.8 — AB (ref 2.0–3.0)

## 2022-06-05 NOTE — Patient Instructions (Signed)
Description   Take 2.5 tablets today and then continue taking Warfarin 2 tablets daily except 2.5 tablets on Sunday, Tuesday, and Thursday.  Recheck INR in 3 weeks.  Stay consistent with greens each week.  Call if placed on any new medications [phone]

## 2022-06-10 DIAGNOSIS — I10 Essential (primary) hypertension: Secondary | ICD-10-CM | POA: Diagnosis not present

## 2022-06-10 DIAGNOSIS — G4733 Obstructive sleep apnea (adult) (pediatric): Secondary | ICD-10-CM | POA: Diagnosis not present

## 2022-06-14 DIAGNOSIS — G4733 Obstructive sleep apnea (adult) (pediatric): Secondary | ICD-10-CM | POA: Diagnosis not present

## 2022-06-26 ENCOUNTER — Ambulatory Visit: Payer: PPO | Attending: Cardiology | Admitting: *Deleted

## 2022-06-26 DIAGNOSIS — I480 Paroxysmal atrial fibrillation: Secondary | ICD-10-CM

## 2022-06-26 DIAGNOSIS — Z7901 Long term (current) use of anticoagulants: Secondary | ICD-10-CM | POA: Diagnosis not present

## 2022-06-26 LAB — POCT INR: INR: 2.7 (ref 2.0–3.0)

## 2022-06-26 NOTE — Patient Instructions (Signed)
Description   Continue taking Warfarin 2 tablets daily except 2.5 tablets on Sunday, Tuesday, and Thursday. Recheck INR in 4 weeks.  Stay consistent with greens each week.  Call if placed on any new medications [phone]       

## 2022-07-08 ENCOUNTER — Other Ambulatory Visit: Payer: Self-pay | Admitting: Cardiology

## 2022-07-08 DIAGNOSIS — I480 Paroxysmal atrial fibrillation: Secondary | ICD-10-CM

## 2022-07-19 DIAGNOSIS — D6869 Other thrombophilia: Secondary | ICD-10-CM | POA: Diagnosis not present

## 2022-07-19 DIAGNOSIS — J45909 Unspecified asthma, uncomplicated: Secondary | ICD-10-CM | POA: Diagnosis not present

## 2022-07-19 DIAGNOSIS — F325 Major depressive disorder, single episode, in full remission: Secondary | ICD-10-CM | POA: Diagnosis not present

## 2022-07-19 DIAGNOSIS — I480 Paroxysmal atrial fibrillation: Secondary | ICD-10-CM | POA: Diagnosis not present

## 2022-07-19 DIAGNOSIS — Z6827 Body mass index (BMI) 27.0-27.9, adult: Secondary | ICD-10-CM | POA: Diagnosis not present

## 2022-07-19 DIAGNOSIS — Z8546 Personal history of malignant neoplasm of prostate: Secondary | ICD-10-CM | POA: Diagnosis not present

## 2022-07-22 ENCOUNTER — Other Ambulatory Visit: Payer: Self-pay | Admitting: Internal Medicine

## 2022-07-22 DIAGNOSIS — J320 Chronic maxillary sinusitis: Secondary | ICD-10-CM | POA: Diagnosis not present

## 2022-07-22 DIAGNOSIS — I77812 Thoracoabdominal aortic ectasia: Secondary | ICD-10-CM | POA: Diagnosis not present

## 2022-07-22 DIAGNOSIS — R7301 Impaired fasting glucose: Secondary | ICD-10-CM | POA: Diagnosis not present

## 2022-07-22 DIAGNOSIS — E785 Hyperlipidemia, unspecified: Secondary | ICD-10-CM | POA: Diagnosis not present

## 2022-07-22 DIAGNOSIS — I10 Essential (primary) hypertension: Secondary | ICD-10-CM | POA: Diagnosis not present

## 2022-07-22 DIAGNOSIS — I480 Paroxysmal atrial fibrillation: Secondary | ICD-10-CM | POA: Diagnosis not present

## 2022-07-22 DIAGNOSIS — Z9989 Dependence on other enabling machines and devices: Secondary | ICD-10-CM | POA: Diagnosis not present

## 2022-07-24 ENCOUNTER — Ambulatory Visit: Payer: PPO | Attending: Cardiology

## 2022-07-24 DIAGNOSIS — Z7901 Long term (current) use of anticoagulants: Secondary | ICD-10-CM

## 2022-07-24 DIAGNOSIS — I480 Paroxysmal atrial fibrillation: Secondary | ICD-10-CM | POA: Diagnosis not present

## 2022-07-24 LAB — POCT INR: INR: 3 (ref 2.0–3.0)

## 2022-07-24 NOTE — Patient Instructions (Signed)
Description   Continue taking Warfarin 2 tablets daily except 2.5 tablets on Sunday, Tuesday, and Thursday. Recheck INR in 5 weeks.  Stay consistent with greens each week.  Call if placed on any new medications [phone]

## 2022-08-06 DIAGNOSIS — M1991 Primary osteoarthritis, unspecified site: Secondary | ICD-10-CM | POA: Diagnosis not present

## 2022-08-06 DIAGNOSIS — M1A09X0 Idiopathic chronic gout, multiple sites, without tophus (tophi): Secondary | ICD-10-CM | POA: Diagnosis not present

## 2022-08-06 DIAGNOSIS — Z6830 Body mass index (BMI) 30.0-30.9, adult: Secondary | ICD-10-CM | POA: Diagnosis not present

## 2022-08-06 DIAGNOSIS — E669 Obesity, unspecified: Secondary | ICD-10-CM | POA: Diagnosis not present

## 2022-08-06 DIAGNOSIS — Z79899 Other long term (current) drug therapy: Secondary | ICD-10-CM | POA: Diagnosis not present

## 2022-08-11 ENCOUNTER — Other Ambulatory Visit: Payer: Self-pay | Admitting: Cardiology

## 2022-08-19 DIAGNOSIS — K529 Noninfective gastroenteritis and colitis, unspecified: Secondary | ICD-10-CM | POA: Diagnosis not present

## 2022-08-19 DIAGNOSIS — D6869 Other thrombophilia: Secondary | ICD-10-CM | POA: Diagnosis not present

## 2022-08-19 DIAGNOSIS — R197 Diarrhea, unspecified: Secondary | ICD-10-CM | POA: Diagnosis not present

## 2022-08-26 ENCOUNTER — Ambulatory Visit
Admission: RE | Admit: 2022-08-26 | Discharge: 2022-08-26 | Disposition: A | Discharge status: Home / Self Care | Payer: PPO | Source: Ambulatory Visit | Attending: Internal Medicine | Admitting: Internal Medicine

## 2022-08-26 DIAGNOSIS — J439 Emphysema, unspecified: Secondary | ICD-10-CM | POA: Diagnosis not present

## 2022-08-26 DIAGNOSIS — I77812 Thoracoabdominal aortic ectasia: Secondary | ICD-10-CM

## 2022-08-26 DIAGNOSIS — K760 Fatty (change of) liver, not elsewhere classified: Secondary | ICD-10-CM | POA: Diagnosis not present

## 2022-08-26 DIAGNOSIS — R911 Solitary pulmonary nodule: Secondary | ICD-10-CM | POA: Diagnosis not present

## 2022-08-26 DIAGNOSIS — I700 Atherosclerosis of aorta: Secondary | ICD-10-CM | POA: Diagnosis not present

## 2022-08-26 MED ORDER — IOPAMIDOL (ISOVUE-370) INJECTION 76%
75.0000 mL | Freq: Once | INTRAVENOUS | Status: AC | PRN
  Administered 2022-08-26: 75 mL via INTRAVENOUS

## 2022-08-28 ENCOUNTER — Ambulatory Visit: Payer: PPO | Attending: Cardiology | Admitting: *Deleted

## 2022-08-28 DIAGNOSIS — Z7901 Long term (current) use of anticoagulants: Secondary | ICD-10-CM

## 2022-08-28 DIAGNOSIS — H524 Presbyopia: Secondary | ICD-10-CM | POA: Diagnosis not present

## 2022-08-28 DIAGNOSIS — I480 Paroxysmal atrial fibrillation: Secondary | ICD-10-CM | POA: Diagnosis not present

## 2022-08-28 DIAGNOSIS — H40013 Open angle with borderline findings, low risk, bilateral: Secondary | ICD-10-CM | POA: Diagnosis not present

## 2022-08-28 LAB — POCT INR: INR: 2.9 (ref 2.0–3.0)

## 2022-08-28 NOTE — Patient Instructions (Signed)
Description   Continue taking Warfarin 2 tablets daily except 2.5 tablets on Sunday, Tuesday, and Thursday. Recheck INR in 6 weeks.  Stay consistent with greens each week.  Call if placed on any new medications [phone]

## 2022-09-12 ENCOUNTER — Other Ambulatory Visit (HOSPITAL_COMMUNITY): Payer: Self-pay | Admitting: *Deleted

## 2022-09-16 DIAGNOSIS — G4733 Obstructive sleep apnea (adult) (pediatric): Secondary | ICD-10-CM | POA: Diagnosis not present

## 2022-09-16 DIAGNOSIS — I10 Essential (primary) hypertension: Secondary | ICD-10-CM | POA: Diagnosis not present

## 2022-09-17 ENCOUNTER — Ambulatory Visit (HOSPITAL_COMMUNITY)
Admission: RE | Admit: 2022-09-17 | Discharge: 2022-09-17 | Disposition: A | Discharge status: Home / Self Care | Payer: PPO | Source: Ambulatory Visit | Attending: Internal Medicine | Admitting: Internal Medicine

## 2022-09-17 LAB — POCT HEMOGLOBIN-HEMACUE: Hemoglobin: 13.3 g/dL (ref 13.0–17.0)

## 2022-09-20 ENCOUNTER — Ambulatory Visit
Admission: RE | Admit: 2022-09-20 | Discharge: 2022-09-20 | Disposition: A | Discharge status: Home / Self Care | Payer: PPO | Source: Ambulatory Visit | Attending: Internal Medicine | Admitting: Internal Medicine

## 2022-09-20 ENCOUNTER — Other Ambulatory Visit: Payer: Self-pay | Admitting: Internal Medicine

## 2022-09-20 ENCOUNTER — Encounter: Payer: Self-pay | Admitting: Family Medicine

## 2022-09-20 DIAGNOSIS — J019 Acute sinusitis, unspecified: Secondary | ICD-10-CM | POA: Diagnosis not present

## 2022-09-20 DIAGNOSIS — J029 Acute pharyngitis, unspecified: Secondary | ICD-10-CM | POA: Diagnosis not present

## 2022-09-20 DIAGNOSIS — J069 Acute upper respiratory infection, unspecified: Secondary | ICD-10-CM

## 2022-09-20 DIAGNOSIS — Z03818 Encounter for observation for suspected exposure to other biological agents ruled out: Secondary | ICD-10-CM | POA: Diagnosis not present

## 2022-09-20 DIAGNOSIS — Z0000 Encounter for general adult medical examination without abnormal findings: Secondary | ICD-10-CM | POA: Diagnosis not present

## 2022-09-20 DIAGNOSIS — J189 Pneumonia, unspecified organism: Secondary | ICD-10-CM | POA: Diagnosis not present

## 2022-09-30 DIAGNOSIS — D72829 Elevated white blood cell count, unspecified: Secondary | ICD-10-CM | POA: Diagnosis not present

## 2022-09-30 DIAGNOSIS — J40 Bronchitis, not specified as acute or chronic: Secondary | ICD-10-CM | POA: Diagnosis not present

## 2022-09-30 DIAGNOSIS — Z720 Tobacco use: Secondary | ICD-10-CM | POA: Diagnosis not present

## 2022-09-30 DIAGNOSIS — J441 Chronic obstructive pulmonary disease with (acute) exacerbation: Secondary | ICD-10-CM | POA: Diagnosis not present

## 2022-10-08 DIAGNOSIS — G4733 Obstructive sleep apnea (adult) (pediatric): Secondary | ICD-10-CM | POA: Diagnosis not present

## 2022-10-09 ENCOUNTER — Ambulatory Visit: Payer: PPO | Attending: Cardiology

## 2022-10-09 DIAGNOSIS — Z7901 Long term (current) use of anticoagulants: Secondary | ICD-10-CM | POA: Diagnosis not present

## 2022-10-09 DIAGNOSIS — I480 Paroxysmal atrial fibrillation: Secondary | ICD-10-CM

## 2022-10-09 LAB — POCT INR: INR: 2.5 (ref 2.0–3.0)

## 2022-10-09 NOTE — Patient Instructions (Signed)
Description   Continue taking Warfarin 2 tablets daily except 2.5 tablets on Sunday, Tuesday, and Thursday. Recheck INR in 6 weeks.  Stay consistent with greens each week.  Call if placed on any new medications [phone]

## 2022-10-22 DIAGNOSIS — J329 Chronic sinusitis, unspecified: Secondary | ICD-10-CM | POA: Diagnosis not present

## 2022-11-01 ENCOUNTER — Ambulatory Visit: Payer: PPO | Attending: Cardiology | Admitting: Cardiology

## 2022-11-01 ENCOUNTER — Encounter: Payer: Self-pay | Admitting: Cardiology

## 2022-11-01 VITALS — BP 140/66 | HR 53 | Ht 73.0 in | Wt 223.0 lb

## 2022-11-01 DIAGNOSIS — I2510 Atherosclerotic heart disease of native coronary artery without angina pectoris: Secondary | ICD-10-CM

## 2022-11-01 DIAGNOSIS — I2584 Coronary atherosclerosis due to calcified coronary lesion: Secondary | ICD-10-CM | POA: Diagnosis not present

## 2022-11-01 DIAGNOSIS — Z7901 Long term (current) use of anticoagulants: Secondary | ICD-10-CM | POA: Diagnosis not present

## 2022-11-01 DIAGNOSIS — I480 Paroxysmal atrial fibrillation: Secondary | ICD-10-CM

## 2022-11-01 NOTE — Patient Instructions (Signed)
Medication Instructions:  The current medical regimen is effective;  continue present plan and medications.  *If you need a refill on your cardiac medications before your next appointment, please call your pharmacy*  Follow-Up: At [facility], you and your health needs are our priority.  As part of our continuing mission to provide you with exceptional heart care, we have created designated Provider Care Teams.  These Care Teams include your primary Cardiologist (physician) and Advanced Practice Providers (APPs -  Physician Assistants and Nurse Practitioners) who all work together to provide you with the care you need, when you need it.  We recommend signing up for the patient portal called "MyChart".  Sign up information is provided on this After Visit Summary.  MyChart is used to connect with patients for Virtual Visits (Telemedicine).  Patients are able to view lab/test results, encounter notes, upcoming appointments, etc.  Non-urgent messages can be sent to your provider as well.   To learn more about what you can do with MyChart, go to [email or web address].    Your next appointment:   6 month(s)  Provider:   Tereso Newcomer, PA-C     Then, Donato Schultz, MD will plan to see you again in 1 year(s).

## 2022-11-01 NOTE — Progress Notes (Signed)
  Cardiology Office Note:  .   Date:  11/01/2022  ID:  Jonathan Vargas, DOB 01/06/1946, MRN [id number] PCP: Lorenda Ishihara, MD  [facility] Providers Cardiologist:  Donato Schultz, MD Cardiology APP:  Beatrice Lecher, PA-C    History of Present Illness: .    Discussed the use of AI scribe software for clinical note transcription with the patient, who gave verbal consent to proceed.  History of Present Illness   Jonathan Vargas, a 77 year old patient with a history of paroxysmal atrial fibrillation and flutter, hypertension, hyperlipidemia, spontaneous coronary artery dissection in 1997, Horner syndrome, bradycardia, hemochromatosis, and prediabetes, presents for a follow-up visit. The patient was previously on hydrochlorothiazide, which was reduced due to low serum sodium, but has since stopped taking it due to side effects. The patient has been trying to quit smoking and has been experiencing occasional atrial fibrillation, which is managed with flecainide. The patient is also on Coumadin and has experienced some bleeding.       Studies Reviewed: Mar[facility] Kitchen        LABS LDL: 92 (07/22/2022)  DIAGNOSTIC ECG: Sinus bradycardia, heart rate 46 bpm  Risk Assessment/Calculations:           Physical Exam:   VS:  BP (!) 140/66   Pulse (!) 53   Ht 6\' 1"  (1.854 m)   Wt 223 lb (101.2 kg)   SpO2 96%   BMI 29.42 kg/m    Wt Readings from Last 3 Encounters:  11/01/22 223 lb (101.2 kg)  04/17/22 223 lb 9.6 oz (101.4 kg)  03/26/22 221 lb 9 oz (100.5 kg)    GEN: Well nourished, well developed in no acute distress NECK: No JVD; No carotid bruits CARDIAC: brady RR, no murmurs, rubs, gallops RESPIRATORY:  Clear to auscultation without rales, wheezing or rhonchi  ABDOMEN: Soft, non-tender, non-distended EXTREMITIES:  No edema; No deformity   ASSESSMENT AND PLAN: .    Assessment and Plan    Paroxysmal Atrial Fibrillation and Flutter Occasional palpitations, well controlled on Flecainide 50mg   BID. -Continue Flecainide 50mg  BID. -Report any changes in symptoms.  Hypertension Self-discontinued Hydrochlorothiazide due to side effects. Currently managed with Irbesartan 150mg  daily. -Continue Irbesartan 150mg  daily.  Hyperlipidemia LDL 92 on Rosuvastatin 5mg  MWF. Discussed goal LDL <70 due to history of coronary artery disease. -Continue Rosuvastatin 5mg  MWF. -Encouraged continued smoking cessation efforts to further reduce cardiovascular risk.  Bradycardia Resting heart rate in the 40s, asymptomatic. Discussed potential need for pacemaker if symptoms develop. -Monitor symptoms and report any changes.  Anticoagulation On Coumadin, no reported bleeding events. -Continue Coumadin, monitor for bleeding.  CAD -mild calcification. Crestor.   Follow-up in 6 months. Gabriel Carina, Donato Schultz, MD

## 2022-11-20 ENCOUNTER — Ambulatory Visit: Payer: PPO | Attending: Cardiovascular Disease

## 2022-11-20 DIAGNOSIS — I480 Paroxysmal atrial fibrillation: Secondary | ICD-10-CM

## 2022-11-20 DIAGNOSIS — Z7901 Long term (current) use of anticoagulants: Secondary | ICD-10-CM

## 2022-11-20 LAB — POCT INR: INR: 3 (ref 2.0–3.0)

## 2022-11-20 NOTE — Patient Instructions (Signed)
Description   Continue taking Warfarin 2 tablets daily except 2.5 tablets on Sunday, Tuesday, and Thursday. Recheck INR in 6 weeks.  Stay consistent with greens each week.  Call if placed on any new medications [phone]

## 2022-12-11 DIAGNOSIS — J0100 Acute maxillary sinusitis, unspecified: Secondary | ICD-10-CM | POA: Diagnosis not present

## 2022-12-11 DIAGNOSIS — J321 Chronic frontal sinusitis: Secondary | ICD-10-CM | POA: Diagnosis not present

## 2022-12-19 ENCOUNTER — Encounter (INDEPENDENT_AMBULATORY_CARE_PROVIDER_SITE_OTHER): Payer: PPO | Admitting: Ophthalmology

## 2022-12-19 DIAGNOSIS — H43813 Vitreous degeneration, bilateral: Secondary | ICD-10-CM | POA: Diagnosis not present

## 2022-12-19 DIAGNOSIS — H353131 Nonexudative age-related macular degeneration, bilateral, early dry stage: Secondary | ICD-10-CM | POA: Diagnosis not present

## 2022-12-19 DIAGNOSIS — H4423 Degenerative myopia, bilateral: Secondary | ICD-10-CM | POA: Diagnosis not present

## 2022-12-19 DIAGNOSIS — Z961 Presence of intraocular lens: Secondary | ICD-10-CM | POA: Diagnosis not present

## 2022-12-30 DIAGNOSIS — C61 Malignant neoplasm of prostate: Secondary | ICD-10-CM | POA: Diagnosis not present

## 2023-01-01 ENCOUNTER — Ambulatory Visit: Payer: PPO | Attending: Cardiology

## 2023-01-01 DIAGNOSIS — I480 Paroxysmal atrial fibrillation: Secondary | ICD-10-CM

## 2023-01-01 DIAGNOSIS — Z7901 Long term (current) use of anticoagulants: Secondary | ICD-10-CM

## 2023-01-01 LAB — POCT INR: INR: 2.9 (ref 2.0–3.0)

## 2023-01-01 NOTE — Patient Instructions (Signed)
Description   Continue taking Warfarin 2 tablets daily except 2.5 tablets on Sunday, Tuesday, and Thursday. Recheck INR in 6 weeks.  Stay consistent with greens each week.  Call if placed on any new medications [phone]

## 2023-01-10 ENCOUNTER — Other Ambulatory Visit: Payer: Self-pay | Admitting: Cardiology

## 2023-01-10 DIAGNOSIS — I480 Paroxysmal atrial fibrillation: Secondary | ICD-10-CM

## 2023-02-03 DIAGNOSIS — R7303 Prediabetes: Secondary | ICD-10-CM | POA: Diagnosis not present

## 2023-02-03 DIAGNOSIS — Z0000 Encounter for general adult medical examination without abnormal findings: Secondary | ICD-10-CM | POA: Diagnosis not present

## 2023-02-03 DIAGNOSIS — Z9989 Dependence on other enabling machines and devices: Secondary | ICD-10-CM | POA: Diagnosis not present

## 2023-02-03 DIAGNOSIS — M1A9XX0 Chronic gout, unspecified, without tophus (tophi): Secondary | ICD-10-CM | POA: Diagnosis not present

## 2023-02-03 DIAGNOSIS — G4733 Obstructive sleep apnea (adult) (pediatric): Secondary | ICD-10-CM | POA: Diagnosis not present

## 2023-02-03 DIAGNOSIS — I10 Essential (primary) hypertension: Secondary | ICD-10-CM | POA: Diagnosis not present

## 2023-02-03 DIAGNOSIS — F3341 Major depressive disorder, recurrent, in partial remission: Secondary | ICD-10-CM | POA: Diagnosis not present

## 2023-02-03 DIAGNOSIS — E785 Hyperlipidemia, unspecified: Secondary | ICD-10-CM | POA: Diagnosis not present

## 2023-02-03 DIAGNOSIS — R9389 Abnormal findings on diagnostic imaging of other specified body structures: Secondary | ICD-10-CM | POA: Diagnosis not present

## 2023-02-03 DIAGNOSIS — Z23 Encounter for immunization: Secondary | ICD-10-CM | POA: Diagnosis not present

## 2023-02-03 DIAGNOSIS — R7309 Other abnormal glucose: Secondary | ICD-10-CM | POA: Diagnosis not present

## 2023-02-04 DIAGNOSIS — Z6830 Body mass index (BMI) 30.0-30.9, adult: Secondary | ICD-10-CM | POA: Diagnosis not present

## 2023-02-04 DIAGNOSIS — Z79899 Other long term (current) drug therapy: Secondary | ICD-10-CM | POA: Diagnosis not present

## 2023-02-04 DIAGNOSIS — M1A09X0 Idiopathic chronic gout, multiple sites, without tophus (tophi): Secondary | ICD-10-CM | POA: Diagnosis not present

## 2023-02-04 DIAGNOSIS — M1991 Primary osteoarthritis, unspecified site: Secondary | ICD-10-CM | POA: Diagnosis not present

## 2023-02-04 DIAGNOSIS — E669 Obesity, unspecified: Secondary | ICD-10-CM | POA: Diagnosis not present

## 2023-02-12 ENCOUNTER — Ambulatory Visit: Payer: PPO | Attending: Cardiology

## 2023-02-12 DIAGNOSIS — I480 Paroxysmal atrial fibrillation: Secondary | ICD-10-CM | POA: Diagnosis not present

## 2023-02-12 DIAGNOSIS — Z7901 Long term (current) use of anticoagulants: Secondary | ICD-10-CM

## 2023-02-12 LAB — POCT INR: INR: 2.7 (ref 2.0–3.0)

## 2023-02-12 NOTE — Patient Instructions (Signed)
Description   Continue taking Warfarin 2 tablets daily except 2.5 tablets on Sunday, Tuesday, and Thursday. Recheck INR in 6 weeks.  Stay consistent with greens each week.  Call if placed on any new medications [phone]

## 2023-02-27 DIAGNOSIS — H353132 Nonexudative age-related macular degeneration, bilateral, intermediate dry stage: Secondary | ICD-10-CM | POA: Diagnosis not present

## 2023-02-27 DIAGNOSIS — H21233 Degeneration of iris (pigmentary), bilateral: Secondary | ICD-10-CM | POA: Diagnosis not present

## 2023-02-27 DIAGNOSIS — H40013 Open angle with borderline findings, low risk, bilateral: Secondary | ICD-10-CM | POA: Diagnosis not present

## 2023-02-27 DIAGNOSIS — H524 Presbyopia: Secondary | ICD-10-CM | POA: Diagnosis not present

## 2023-02-27 DIAGNOSIS — H15833 Staphyloma posticum, bilateral: Secondary | ICD-10-CM | POA: Diagnosis not present

## 2023-03-03 DIAGNOSIS — I788 Other diseases of capillaries: Secondary | ICD-10-CM | POA: Diagnosis not present

## 2023-03-03 DIAGNOSIS — L814 Other melanin hyperpigmentation: Secondary | ICD-10-CM | POA: Diagnosis not present

## 2023-03-03 DIAGNOSIS — L821 Other seborrheic keratosis: Secondary | ICD-10-CM | POA: Diagnosis not present

## 2023-03-03 DIAGNOSIS — Z85828 Personal history of other malignant neoplasm of skin: Secondary | ICD-10-CM | POA: Diagnosis not present

## 2023-03-03 DIAGNOSIS — D2271 Melanocytic nevi of right lower limb, including hip: Secondary | ICD-10-CM | POA: Diagnosis not present

## 2023-03-03 DIAGNOSIS — D225 Melanocytic nevi of trunk: Secondary | ICD-10-CM | POA: Diagnosis not present

## 2023-03-03 DIAGNOSIS — D1801 Hemangioma of skin and subcutaneous tissue: Secondary | ICD-10-CM | POA: Diagnosis not present

## 2023-03-03 DIAGNOSIS — L570 Actinic keratosis: Secondary | ICD-10-CM | POA: Diagnosis not present

## 2023-03-20 ENCOUNTER — Encounter (HOSPITAL_COMMUNITY): Payer: PPO

## 2023-03-21 ENCOUNTER — Other Ambulatory Visit: Payer: Self-pay | Admitting: Cardiology

## 2023-03-24 ENCOUNTER — Ambulatory Visit (HOSPITAL_COMMUNITY)
Admission: RE | Admit: 2023-03-24 | Discharge: 2023-03-24 | Disposition: A | Discharge status: Home / Self Care | Payer: PPO | Source: Ambulatory Visit | Attending: Internal Medicine | Admitting: Internal Medicine

## 2023-03-24 LAB — POCT HEMOGLOBIN-HEMACUE: Hemoglobin: 12.2 g/dL — ABNORMAL LOW (ref 13.0–17.0)

## 2023-03-26 ENCOUNTER — Ambulatory Visit: Payer: PPO | Attending: Cardiology | Admitting: *Deleted

## 2023-03-26 DIAGNOSIS — Z7901 Long term (current) use of anticoagulants: Secondary | ICD-10-CM | POA: Diagnosis not present

## 2023-03-26 DIAGNOSIS — I480 Paroxysmal atrial fibrillation: Secondary | ICD-10-CM | POA: Diagnosis not present

## 2023-03-26 LAB — POCT INR: INR: 2.6 (ref 2.0–3.0)

## 2023-03-26 NOTE — Patient Instructions (Signed)
 Description   Continue taking Warfarin 2 tablets daily except 2.5 tablets on Sunday, Tuesday, and Thursday. Recheck INR in 6 weeks.  Stay consistent with greens each week.  Call if placed on any new medications [phone]

## 2023-04-16 ENCOUNTER — Encounter: Payer: Self-pay | Admitting: Emergency Medicine

## 2023-04-16 ENCOUNTER — Ambulatory Visit: Payer: PPO | Admitting: Emergency Medicine

## 2023-04-16 VITALS — BP 114/64 | HR 54 | Temp 98.0°F | Ht 73.0 in | Wt 220.8 lb

## 2023-04-16 DIAGNOSIS — J4489 Other specified chronic obstructive pulmonary disease: Secondary | ICD-10-CM

## 2023-04-16 DIAGNOSIS — R911 Solitary pulmonary nodule: Secondary | ICD-10-CM | POA: Diagnosis not present

## 2023-04-16 DIAGNOSIS — I719 Aortic aneurysm of unspecified site, without rupture: Secondary | ICD-10-CM | POA: Insufficient documentation

## 2023-04-16 DIAGNOSIS — Z720 Tobacco use: Secondary | ICD-10-CM

## 2023-04-16 DIAGNOSIS — I716 Thoracoabdominal aortic aneurysm, without rupture, unspecified: Secondary | ICD-10-CM

## 2023-04-16 NOTE — Assessment & Plan Note (Signed)
Thoracoabdominal Aortic Aneurysm Stable for the past 8-10 years. No interventions required to date. -Continue current surveillance as per vascular surgery.

## 2023-04-16 NOTE — Assessment & Plan Note (Signed)
Pulmonary Nodule New 1.3 x 1.2 cm nodule in the right lower lobe identified on CT angiography of the chest on 08/26/2022. No prior imaging for comparison. Differential includes scar, infection, inflammation, or neoplasm. Patient has a significant smoking history. -Order repeat CT chest to assess for stability or change in the nodule. -Diagnostics depending on interval stability / change. He may be a candidate for resection if indicated.

## 2023-04-16 NOTE — Patient Instructions (Signed)
VISIT SUMMARY:  Today, we discussed the results of your recent CT scan of the chest, which showed a new nodule in your right lower lung. We also reviewed your asthma management, the status of your thoracoabdominal aortic aneurysm, and your smoking habits.  YOUR PLAN:  -PULMONARY NODULE: A new nodule measuring 1.3 x 1.2 cm was found in your right lower lung. This could be due to a scar, infection, inflammation, or possibly a tumor. We will order a repeat CT scan to see if there are any changes in the nodule.  -ASTHMA: Your asthma symptoms are infrequent, and you are currently using albuterol as needed. There have been no recent flare-ups requiring additional medication. Continue using albuterol as needed.  -THORACOABDOMINAL AORTIC ANEURYSM: Your thoracoabdominal aortic aneurysm has been stable for the past 8-10 years and does not require any interventions at this time. We will continue to monitor it as recommended..  -TOBACCO USE: You are currently smoking 3-5 cigarettes per day with a history of smoking for over 20 years. It is important to quit smoking to improve your overall health. We encourage you to consider smoking cessation options.  INSTRUCTIONS:  Please schedule a repeat CT scan of your chest to monitor the nodule in your right lower lung. Continue using albuterol as needed for your asthma, and follow up with vascular surgery for your thoracoabdominal aortic aneurysm as recommended. Consider smoking cessation options to improve your health.

## 2023-04-16 NOTE — Assessment & Plan Note (Signed)
Asthma Infrequent symptoms, uses albuterol as needed. No current use of scheduled inhalers. No recent exacerbations requiring systemic steroids or antibiotics. -Continue current management with albuterol as needed.

## 2023-04-16 NOTE — Progress Notes (Signed)
Subjective:    Patient ID: Jonathan Vargas, adult    DOB: 1945-08-27, 78 y.o.   MRN: [id number]  HPI The patient is a 78 year old male with COPD/asthma, atrial fibrillation, hypertension, OSA on CPAP, cerebrovascular disease, and thoracoabdominal aortic aneurysm who presents to discuss an abnormal CT scan of the chest.  A CT angiography of the chest performed on August 26, 2022, to monitor his thoracoabdominal aortic aneurysm revealed mild paraseptal and centrilobular emphysema changes, coarse linear markings in the right lower lobe, and a posterior basal nodule measuring 1.3 by 1.2 centimeters. This nodule was not present on prior imaging from June 29, 2019. He has not had any repeat imaging since the June 2024 scan.  He has a history of COPD/asthma but is not on scheduled bronchodilator therapy. He uses albuterol as needed, with infrequent use, and has not experienced significant flare-ups requiring additional medication like prednisone or antibiotics, except during severe URI illness. He has not undergone pulmonary function testing.  He has a history of active tobacco use, having smoked for nearly 40 years. He quit for 13 years but resumed smoking, currently smoking about 3 to 5 cigarettes a day, with a smoking history of over 20 pack-years. There is no family history of lung cancer.  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 He has no significant military or hobby-related exposures. He grew up in [place], attended college in [place], and currently resides in [place].  No current symptoms of infection or inflammatory processes. He has not been diagnosed with diseases like rheumatoid arthritis, lupus, or scleroderma.   RADIOLOGY CT angiography chest: No mediastinal or hilar lymphadenopathy, mild paraseptal and centrilobular emphysema, coarse linear  markings in the right lower lobe with a posterior basal nodule measuring 1.3 x 1.2 cm (08/26/2022).   Review of Systems As per HPI  Past Medical History:  Diagnosis Date   Acid reflux    Arthritis    ASD (atrial septal defect)    Asthma with allergic rhinitis and status asthmaticus    NO RECENT FLARE UPS   Atrial fib/flutter, transient (HCC)    Cancer (HCC)    skin   Chronic anticoagulation    Depression    Dysrhythmia    ATRIAL FIB   ENT complaint    Dr. Lazarus Salines   Erectile dysfunction    Fatty liver    FHx: allergies    Gout    , Possible, Right Knee x2 ; intermittent milder episodes of pain in hte first MTP - uric acid 7.7- no treatment so far.   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   Pneumonia    " walking PNA > 40 years ago"   Prostate cancer (HCC) DX 2018   Seasonal allergies    Sleep apnea    wears CPAP ( set at 4-11)   Trigger finger, left    , fourth finger     Family History  Problem Relation Age of Onset   Stroke Father    Diabetes type II Father  Colon cancer Father    Heart disease Mother        with pacemaker   Breast cancer Sister    Diabetes type II Maternal Grandfather    Leukemia Paternal Uncle    Prostate cancer Other    Pancreatic cancer Neg Hx     - No known family history of lung cancer  Social History   Socioeconomic History   Marital status: Divorced    Spouse name: Not on file   Number of children: Not on file   Years of education: Bachelors   Highest education level: Not on file  Occupational History   Occupation: retired  Tobacco Use   Smoking status: Every Day    Current packs/day: 0.25    Average packs/day: 0.3 packs/day for 50.5 years  (12.6 ttl pk-yrs)    Types: Cigarettes    Start date: 10/30/2013   Smokeless tobacco: Former    Types: Chew   Tobacco comments:    trying to quit 5 TO 6 CIGS PER DAY NOW  Vaping Use   Vaping status: Former  Substance and Sexual Activity   Alcohol use: Yes    Alcohol/week: 0.0 standard drinks of alcohol    Comment: 2- glasses of red wine per night   Drug use: No   Sexual activity: Not Currently  Other Topics Concern   Not on file  Social History Narrative   Pt lives in [place] alone.  Retired from [employer]. Right handed. 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     allopurinol (ZYLOPRIM) 300 MG tablet Take 300 mg by mouth every evening.   1   Cholecalciferol (VITAMIN D3) 125 MCG (5000 UT) CAPS Take 5,000 Units by mouth daily.     COLCRYS 0.6 MG tablet Take 0.6 mg by mouth 2 (two) times daily as needed (for gout).      diclofenac sodium (VOLTAREN) 1 % GEL Apply 1 application topically 4 (four) times daily as needed (for pain).   3   fexofenadine (ALLEGRA) 60 MG tablet Take 60  mg by mouth daily as needed for allergies.     flecainide (TAMBOCOR) 50 MG tablet TAKE 1 TABLET BY MOUTH 2 TIMES DAILY FOR THE HEART. 180 tablet 1   irbesartan (AVAPRO) 150 MG tablet TAKE 1 TABLET BY MOUTH DAILY. 90 tablet 2   Magnesium 200 MG TABS Take 600 mg by mouth daily.     Menaquinone-7 (VITAMIN K2) 100 MCG CAPS Take 100 mcg by mouth daily.  Multiple Vitamins-Minerals (LUTEIN-ZEAXANTHIN) TABS Take 1 tablet by mouth daily.     NON FORMULARY Pt uses a cpap nightly     rosuvastatin (CRESTOR) 5 MG tablet Take 5 mg by mouth every Monday, Wednesday, and Friday.     trolamine salicylate (ASPERCREME) 10 % cream Apply 1 Application topically as needed for muscle pain.     Turmeric 400 MG CAPS Take 400 mg by mouth 2 (two) times daily.     warfarin (COUMADIN) 5 MG tablet TAKE 2 TO 2.5 TABLETS BY MOUTH ONCE DAILY AS DIRECTED BY COUMADIN CLINIC 225 tablet 1   Zinc 30 MG TABS Take 30 mg by mouth daily.     CALCIUM PO Take 1 tablet by mouth in the morning, at noon, and at bedtime. (Patient not taking: Reported on 11/01/2022)     hydrochlorothiazide (MICROZIDE) 12.5 MG capsule Take 1 capsule (12.5 mg total) by mouth 3 (three) times a week. (Patient not taking: Reported on 04/16/2023) 40 capsule 0   No facility-administered medications prior to visit.        Objective:   Physical Exam Vitals:   04/16/23 0943  BP: 114/64  Pulse: (!) 54  Temp: 98 F (36.7 C)  TempSrc: Temporal  SpO2: 97%  Weight: 220 lb 12.8 oz (100.2 kg)  Height: 6\' 1"  (1.854 m)    Gen: Pleasant, well-nourished, in no distress,  normal affect  ENT: No lesions,  mouth clear,  oropharynx clear, no postnasal drip  Neck: No JVD, no stridor  Lungs: No use of accessory muscles, no crackles or wheezing on normal respiration, no wheeze on forced expiration  Cardiovascular: RRR, heart sounds normal, no murmur or gallops, no peripheral edema  Musculoskeletal: No deformities, no cyanosis or clubbing  Neuro: alert, awake, non  focal  Skin: Warm, no lesions or rash       Assessment & Plan:  Pulmonary nodule 1 cm or greater in diameter Pulmonary Nodule New 1.3 x 1.2 cm nodule in the right lower lobe identified on CT angiography of the chest on 08/26/2022. No prior imaging for comparison. Differential includes scar, infection, inflammation, or neoplasm. Patient has a significant smoking history. -Order repeat CT chest to assess for stability or change in the nodule. -Diagnostics depending on interval stability / change. He may be a candidate for resection if indicated.   COPD with asthma (HCC) Asthma Infrequent symptoms, uses albuterol as needed. No current use of scheduled inhalers. No recent exacerbations requiring systemic steroids or antibiotics. -Continue current management with albuterol as needed.  Tobacco use Tobacco Use Active smoker with a 20+ pack-year history. Current use of 3-5 cigarettes per day. -Encourage smoking cessation.  Aortic aneurysm (HCC) Thoracoabdominal Aortic Aneurysm Stable for the past 8-10 years. No interventions required to date. -Continue current surveillance as per vascular surgery.   Levy Pupa, MD, PhD 04/16/2023, 10:13 AM [facility] [phone] or if no answer before 7:00PM call [phone] For any issues after 7:00PM please call eLink [phone]

## 2023-04-16 NOTE — Assessment & Plan Note (Signed)
Tobacco Use Active smoker with a 20+ pack-year history. Current use of 3-5 cigarettes per day. -Encourage smoking cessation.

## 2023-04-23 ENCOUNTER — Institutional Professional Consult (permissible substitution): Payer: PPO | Admitting: Pulmonary Disease

## 2023-04-30 ENCOUNTER — Ambulatory Visit: Payer: PPO | Admitting: Physician Assistant

## 2023-05-07 ENCOUNTER — Ambulatory Visit: Payer: PPO

## 2023-05-09 ENCOUNTER — Ambulatory Visit
Admission: RE | Admit: 2023-05-09 | Discharge: 2023-05-09 | Disposition: A | Discharge status: Home / Self Care | Payer: PPO | Source: Ambulatory Visit | Attending: Emergency Medicine | Admitting: Emergency Medicine

## 2023-05-09 DIAGNOSIS — R911 Solitary pulmonary nodule: Secondary | ICD-10-CM

## 2023-05-12 NOTE — Progress Notes (Signed)
 "     Cardiology Office Note:    Date:  05/13/2023  ID:  Jonathan Vargas, DOB [birth year], MRN [id number] PCP: Jonathan Charm, MD  [facility] Providers Cardiologist:  Jonathan Parchment, MD Cardiology APP:  Jonathan Jonathan DASEN, PA-C       Patient Profile:      Paroxysmal atrial fibrillation/flutter Admx 2012 w/ AFlutter, conv on Dilt w 8.2 post-term pause // S/p DCCV in 3/18 Warfarin anticoagulation; Flecainide  Rx  TTE 06/12/2017: Mild LVH, EF 55-60, normal wall motion, moderate LAE ZIO monitor 07/2019: NSR, no AFib, rare PAT/PVCs/PACs; brief idioventricular rhythm during sleep Myoview  01/07/2018: EF 55, normal perfusion; low risk Coronary artery Ca2+ (CT 08/2022) Hypertension  Hyperlipidemia (intol of statins) Hx of spontaneous carotid artery dissection 1997 (presented with Horner's syndrome) Bradycardia  +cigs +ETOH Lung nodule (RLL on CT 08/2022) - Jonathan Vargas >> Rx w phlebotomy 2x per year Pre-Diabetes mellitus  GERD Fatty liver Gout Prostate CA  OSA   Venous insufficiency           Discussed the use of AI scribe software for clinical note transcription with the patient, who gave verbal consent to proceed. History of Present Illness Jonathan Vargas is a 78 y.o. adult who returns for follow up of atrial fibrillation. He was last seen by Dr. Parchment in 10/2022.  He reports recently recovering from the flu, which has left him with residual fatigue and an intermittent cough. Despite this, he has resumed his exercise routine, which he reports drained his energy but also felt good. He denies any chest symptoms, shortness of breath, or leg swelling beyond his usual left leg swelling due to a previous injury. He also denies any bleeding, black stools, or bloody urine. He continues to smoke, albeit inconsistently.  Review of Systems  Gastrointestinal:  Negative for hematochezia and melena.  Genitourinary:  Negative for hematuria.  -See HPI    Studies  Reviewed:   EKG Interpretation Date/Time:  Tuesday May 13 2023 14:12:31 EST Ventricular Rate:  83 PR Interval:  172 QRS Duration:  106 QT Interval:  380 QTC Calculation: 446 R Axis:   -47  Text Interpretation: Normal sinus rhythm Left anterior fascicular block Minimal voltage criteria for LVH, may be normal variant ( R in aVL ) Incomplete right bundle branch block Poor R wave progression No significant change since last tracing Confirmed by Jonathan Jonathan [phone]) on 05/13/2023 2:18:33 PM    Results LABS Total cholesterol: 129 (02/03/2023) HDL: 51 (02/03/2023) LDL: 57 (02/03/2023) Triglycerides: 116 (02/03/2023) ALT: 16 (02/03/2023) Hemoglobin: 12.2 (03/24/2023)    Risk Assessment/Calculations:    CHA2DS2-VASc Score = 4   This indicates a 4.8% annual risk of stroke. The patient's score is based upon: CHF History: 0 HTN History: 1 Diabetes History: 0 Stroke History: 0 Vascular Disease History: 1 Age Score: 2 Gender Score: 0            Physical Exam:   VS:  BP 118/63   Pulse 83   Ht 6' 1 (1.854 m)   Wt 223 lb (101.2 kg)   SpO2 95%   BMI 29.42 kg/m    Wt Readings from Last 3 Encounters:  05/13/23 223 lb (101.2 kg)  04/16/23 220 lb 12.8 oz (100.2 kg)  03/24/23 215 lb (97.5 kg)    Constitutional:      Appearance: Healthy appearance. Not in distress.  Neck:     Vascular: JVD normal.  Pulmonary:     Breath sounds:  Normal breath sounds. No wheezing. No rales.  Cardiovascular:     Normal rate. Regular rhythm.     Murmurs: There is no murmur.  Edema:    Peripheral edema present.    Ankle: bilateral 1+ edema of the ankle. Abdominal:     Palpations: Abdomen is soft.      Assessment and Plan:   Assessment & Plan Paroxysmal atrial fibrillation (HCC) Managed with warfarin for anticoagulation and flecainide  for rhythm control. Not on rate-controlling medications due to bradycardia. Recent EKG showed slightly elevated heart rate, likely due to recent flu. QRS  stable.  - Continue flecainide  50 mg twice daily - Continue follow-up with Coumadin  in the Coumadin  clinic - Follow up in 6 months Coronary artery calcification Noted on prior CT scan. No angina symptoms. Not on aspirin  due to Coumadin  therapy. - Continue rosuvastatin 5 mg three times a week Aortic atherosclerosis (HCC) Continue Rosuvastatin  Tobacco use We discussed the importance of quitting. Essential hypertension, benign Well-controlled with irbesartan .  - Continue irbesartan  150 mg daily Pure hypercholesterolemia LDL optimal. - Continue rosuvastatin 5 mg three times a week        Dispo:  Return in about 6 months (around 11/10/2023) for Routine Follow Up, w/ Jonathan Ferrier, PA-C.  Signed, Jonathan Ferrier, PA-C   "

## 2023-05-13 ENCOUNTER — Ambulatory Visit: Payer: PPO | Attending: Physician Assistant | Admitting: Physician Assistant

## 2023-05-13 ENCOUNTER — Encounter: Payer: Self-pay | Admitting: Physician Assistant

## 2023-05-13 VITALS — BP 118/63 | HR 83 | Ht 73.0 in | Wt 223.0 lb

## 2023-05-13 DIAGNOSIS — I700 Atherosclerosis of aorta: Secondary | ICD-10-CM | POA: Diagnosis not present

## 2023-05-13 DIAGNOSIS — Z720 Tobacco use: Secondary | ICD-10-CM

## 2023-05-13 DIAGNOSIS — I2510 Atherosclerotic heart disease of native coronary artery without angina pectoris: Secondary | ICD-10-CM | POA: Diagnosis not present

## 2023-05-13 DIAGNOSIS — I10 Essential (primary) hypertension: Secondary | ICD-10-CM

## 2023-05-13 DIAGNOSIS — I480 Paroxysmal atrial fibrillation: Secondary | ICD-10-CM | POA: Diagnosis not present

## 2023-05-13 DIAGNOSIS — E7800 Pure hypercholesterolemia, unspecified: Secondary | ICD-10-CM

## 2023-05-13 NOTE — Assessment & Plan Note (Signed)
We discussed the importance of quitting. 

## 2023-05-13 NOTE — Assessment & Plan Note (Signed)
 Noted on prior CT scan. No angina symptoms. Not on aspirin due to Coumadin therapy. - Continue rosuvastatin 5 mg three times a week

## 2023-05-13 NOTE — Assessment & Plan Note (Signed)
 Continue Rosuvastatin

## 2023-05-13 NOTE — Assessment & Plan Note (Signed)
 Managed with warfarin for anticoagulation and flecainide for rhythm control. Not on rate-controlling medications due to bradycardia. Recent EKG showed slightly elevated heart rate, likely due to recent flu. QRS stable.  - Continue flecainide 50 mg twice daily - Continue follow-up with Coumadin in the Coumadin clinic - Follow up in 6 months

## 2023-05-13 NOTE — Patient Instructions (Signed)
 Medication Instructions:  Your physician recommends that you continue on your current medications as directed. Please refer to the Current Medication list given to you today.  *If you need a refill on your cardiac medications before your next appointment, please call your pharmacy*   Lab Work: None ordered  If you have labs (blood work) drawn today and your tests are completely normal, you will receive your results only by: MyChart Message (if you have MyChart) OR A paper copy in the mail If you have any lab test that is abnormal or we need to change your treatment, we will call you to review the results.   Testing/Procedures: None ordered   Follow-Up: At [facility], you and your health needs are our priority.  As part of our continuing mission to provide you with exceptional heart care, we have created designated Provider Care Teams.  These Care Teams include your primary Cardiologist (physician) and Advanced Practice Providers (APPs -  Physician Assistants and Nurse Practitioners) who all work together to provide you with the care you need, when you need it.  We recommend signing up for the patient portal called "MyChart".  Sign up information is provided on this After Visit Summary.  MyChart is used to connect with patients for Virtual Visits (Telemedicine).  Patients are able to view lab/test results, encounter notes, upcoming appointments, etc.  Non-urgent messages can be sent to your provider as well.   To learn more about what you can do with MyChart, go to [email or web address].    Your next appointment:   6 month(s)  Provider:   Tereso Newcomer, PA-C         Other Instructions     1st Floor: - Lobby - Registration  - Pharmacy  - Lab - Cafe  2nd Floor: - PV Lab - Diagnostic Testing (echo, CT, nuclear med)  3rd Floor: - Vacant  4th Floor: - TCTS (cardiothoracic surgery) - AFib Clinic - Structural Heart Clinic - Vascular Surgery  - Vascular  Ultrasound  5th Floor: - HeartCare Cardiology (general and EP) - Clinical Pharmacy for coumadin, hypertension, lipid, weight-loss medications, and med management appointments    Valet parking services will be available as well.

## 2023-05-13 NOTE — Assessment & Plan Note (Signed)
 Well-controlled with irbesartan.  - Continue irbesartan 150 mg daily

## 2023-05-13 NOTE — Assessment & Plan Note (Signed)
 LDL optimal. - Continue rosuvastatin 5 mg three times a week

## 2023-05-14 ENCOUNTER — Ambulatory Visit: Payer: PPO | Attending: Cardiovascular Disease | Admitting: *Deleted

## 2023-05-14 DIAGNOSIS — I480 Paroxysmal atrial fibrillation: Secondary | ICD-10-CM

## 2023-05-14 DIAGNOSIS — Z7901 Long term (current) use of anticoagulants: Secondary | ICD-10-CM

## 2023-05-14 LAB — POCT INR: INR: 3.9 — AB (ref 2.0–3.0)

## 2023-05-14 NOTE — Patient Instructions (Signed)
 Description   Do not take any warfarin today then continue taking Warfarin 2 tablets daily except 2.5 tablets on Sunday, Tuesday, and Thursday. Have some leafy veggies today and remain consistent. Recheck INR in 4 weeks.  Stay consistent with greens each week.  Call if placed on any new medications [phone]

## 2023-05-21 ENCOUNTER — Encounter: Payer: Self-pay | Admitting: Emergency Medicine

## 2023-05-21 ENCOUNTER — Ambulatory Visit: Payer: PPO | Admitting: Emergency Medicine

## 2023-05-21 VITALS — BP 118/70 | HR 51 | Ht 73.0 in | Wt 226.4 lb

## 2023-05-21 DIAGNOSIS — J4489 Other specified chronic obstructive pulmonary disease: Secondary | ICD-10-CM

## 2023-05-21 DIAGNOSIS — F17200 Nicotine dependence, unspecified, uncomplicated: Secondary | ICD-10-CM | POA: Diagnosis not present

## 2023-05-21 DIAGNOSIS — R911 Solitary pulmonary nodule: Secondary | ICD-10-CM

## 2023-05-21 NOTE — Progress Notes (Signed)
 Subjective:    Patient ID: Jonathan Vargas, adult    DOB: 03/14/1946, 78 y.o.   MRN: [id number]  HPI  ROV 05/21/2023 --78 year old gentleman with a history of active tobacco use and COPD/asthma.  He is not currently on any bronchodilator therapy.  Also with a history of TAA.  I saw him in January to discuss a 1.3 x 1.2 cm right lower lobe pulmonary nodule noted on CT chest 08/2022.  Repeat CT chest was done as below. Today he reports that he was recently ill with the flu in mid-February, still having some left ear fullness and irritation, daytime cough. He took Tamiflu. He still has some cough but improving, No mucous.   CT scan of the chest done 05/09/2023 reviewed by me, shows bilateral bronchial wall thickening, minimal centrilobular and paraseptal emphysema, a background of fine centrilobular nodularity at the apices.  The nodular area in question looks more bandlike and is smaller.    Review of Systems As per HPI  Past Medical History:  Diagnosis Date   Acid reflux    Arthritis    ASD (atrial septal defect)    Asthma with allergic rhinitis and status asthmaticus    NO RECENT FLARE UPS   Atrial fib/flutter, transient (HCC)    Cancer (HCC)    skin   Chronic anticoagulation    Depression    Dysrhythmia    ATRIAL FIB   ENT complaint    Dr. Lazarus Salines   Erectile dysfunction    Fatty liver    FHx: allergies    Gout    , Possible, Right Knee x2 ; intermittent milder episodes of pain in hte first MTP - uric acid 7.7- no treatment so far.   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   Pneumonia    " walking PNA > 40 years ago"   Prostate cancer (HCC) DX 2018   Seasonal allergies    Sleep apnea     wears CPAP ( set at 4-11)   Trigger finger, left    , fourth finger     Family History  Problem Relation Age of Onset   Stroke Father    Diabetes type II Father    Colon cancer Father    Heart disease Mother        with pacemaker   Breast cancer Sister    Diabetes type II Maternal Grandfather    Leukemia Paternal Uncle    Prostate cancer Other    Pancreatic cancer Neg Hx     - No known family history of lung cancer  Social History   Socioeconomic History   Marital status: Divorced    Spouse name: Not on file   Number of children: Not on file   Years of education: Bachelors   Highest education level: Not on file  Occupational History   Occupation: retired  Tobacco Use   Smoking status: Every Day    Current packs/day: 0.25    Average packs/day: 0.3 packs/day for 50.6 years (12.6 ttl pk-yrs)    Types: Cigarettes    Start date: 10/30/2013   Smokeless tobacco: Former    Types: Sports administrator  Tobacco comments:    trying to quit 4-5 cigs per day. 05/21/23  Vaping Use   Vaping status: Former  Substance and Sexual Activity   Alcohol use: Yes    Alcohol/week: 0.0 standard drinks of alcohol    Comment: 2- glasses of red wine per night   Drug use: No   Sexual activity: Not Currently  Other Topics Concern   Not on file  Social History Narrative   Pt lives in [place] alone.  Retired from [employer]. Right handed. 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     allopurinol (ZYLOPRIM) 300 MG tablet Take 300 mg by mouth every evening.   1   Cholecalciferol (VITAMIN D3) 125 MCG (5000 UT) CAPS Take 5,000 Units by mouth daily.     COLCRYS 0.6 MG tablet Take 0.6 mg by mouth 2 (two) times daily as needed (for gout).      diclofenac sodium (VOLTAREN) 1 % GEL Apply 1 application topically 4 (four) times daily as needed (for pain).   3   fexofenadine (ALLEGRA) 60 MG tablet Take 60 mg by mouth daily as needed for allergies.     flecainide (TAMBOCOR) 50 MG tablet TAKE 1 TABLET BY MOUTH 2 TIMES DAILY FOR THE HEART. 180 tablet 1   irbesartan (AVAPRO) 150 MG tablet TAKE 1 TABLET BY MOUTH DAILY. 90 tablet 2   Magnesium 200 MG TABS Take 600 mg by mouth daily.     Menaquinone-7 (VITAMIN K2) 100 MCG CAPS Take 100 mcg by mouth daily.     Multiple Vitamins-Minerals (LUTEIN-ZEAXANTHIN) TABS Take 1 tablet by mouth daily.     NON FORMULARY Pt uses a cpap nightly     rosuvastatin (CRESTOR) 5 MG tablet Take 5 mg by mouth every Monday, Wednesday, and Friday.     trolamine salicylate (ASPERCREME) 10 % cream Apply 1 Application topically as needed for muscle pain.     Turmeric 400 MG CAPS Take 400 mg by mouth 2 (two) times daily.     warfarin (COUMADIN) 5 MG tablet TAKE 2 TO 2.5 TABLETS BY MOUTH ONCE DAILY AS DIRECTED BY COUMADIN CLINIC 225 tablet 1   Zinc 30 MG TABS Take 30 mg by mouth daily.     No facility-administered medications prior to visit.        Objective:   Physical Exam Vitals:   05/21/23 1049  BP: 118/70  Pulse: (!) 51  SpO2: 96%  Weight: 226 lb 6.4 oz (102.7 kg)  Height: 6\' 1"  (1.854 m)    Gen: Pleasant, well-nourished, in no distress,  normal affect  ENT: No lesions,  mouth clear,  oropharynx  clear, no postnasal drip  Neck: No JVD, no stridor  Lungs: No use of accessory muscles, no crackles or wheezing on normal respiration, no wheeze on forced expiration  Cardiovascular: RRR, heart sounds normal, no murmur or gallops, no peripheral edema  Musculoskeletal: No deformities, no cyanosis or clubbing  Neuro: alert, awake, non focal  Skin: Warm, no lesions or rash       Assessment & Plan:  Pulmonary nodule 1 cm or greater in diameter His right basilar nodule is a bit smaller and has a more bandlike appearance.  Reassuring.  Suspect this is resolving inflammation versus scar.  Discussed this with him today.  I think it would be reasonable to follow and he should qualify for lung cancer screening.  We talked about the pros and cons, did a shared decision-making visit today.  He agrees that he wants to follow it until age 69.  I did discuss smoking cessation with him today.  COPD with asthma (HCC) Not on scheduled bronchodilator therapy.  He has albuterol available to use as needed, rarely uses.  He did have the flu recently but is improving from this and is not having any evidence of bronchospasm.    Levy Pupa, MD, PhD 05/21/2023, 11:17 AM [facility] [phone] or if no answer before 7:00PM call [phone] For any issues after 7:00PM please call eLink [phone]

## 2023-05-21 NOTE — Assessment & Plan Note (Signed)
 Not on scheduled bronchodilator therapy.  He has albuterol available to use as needed, rarely uses.  He did have the flu recently but is improving from this and is not having any evidence of bronchospasm.

## 2023-05-21 NOTE — Assessment & Plan Note (Signed)
 His right basilar nodule is a bit smaller and has a more bandlike appearance.  Reassuring.  Suspect this is resolving inflammation versus scar.  Discussed this with him today.  I think it would be reasonable to follow and he should qualify for lung cancer screening.  We talked about the pros and cons, did a shared decision-making visit today.  He agrees that he wants to follow it until age 78.  I did discuss smoking cessation with him today.

## 2023-05-21 NOTE — Patient Instructions (Addendum)
 We reviewed your CT scan of the chest today.  The nodule in question at the bottom of the right lung looks slightly smaller than on your previous scan.  This is good news. We will plan to perform a lung cancer screening CT chest in February 2026. Okay to keep albuterol available to use if you have shortness of breath, chest tightness, wheezing. Please call our office if you develop any respiratory symptoms or have any worsening of your breathing Continue to work on decreasing your cigarettes.  Ultimate goal would be to stop altogether. Follow Dr. Delton Coombes in February 2026 after your CT chest so we can review those results together.

## 2023-06-02 DIAGNOSIS — H6590 Unspecified nonsuppurative otitis media, unspecified ear: Secondary | ICD-10-CM | POA: Diagnosis not present

## 2023-06-02 DIAGNOSIS — J029 Acute pharyngitis, unspecified: Secondary | ICD-10-CM | POA: Diagnosis not present

## 2023-06-02 DIAGNOSIS — H68009 Unspecified Eustachian salpingitis, unspecified ear: Secondary | ICD-10-CM | POA: Diagnosis not present

## 2023-06-11 ENCOUNTER — Ambulatory Visit: Payer: PPO | Attending: Cardiology | Admitting: *Deleted

## 2023-06-11 DIAGNOSIS — I480 Paroxysmal atrial fibrillation: Secondary | ICD-10-CM

## 2023-06-11 DIAGNOSIS — Z7901 Long term (current) use of anticoagulants: Secondary | ICD-10-CM | POA: Diagnosis not present

## 2023-06-11 LAB — POCT INR: INR: 2.6 (ref 2.0–3.0)

## 2023-06-11 NOTE — Patient Instructions (Addendum)
 Description   Continue taking Warfarin 2 tablets daily except 2.5 tablets on Sunday, Tuesday, and Thursday. Have some leafy veggies today and remain consistent. Recheck INR in 5 weeks.  Stay consistent with greens each week.  Call if placed on any new medications [phone]

## 2023-07-15 ENCOUNTER — Telehealth: Payer: Self-pay | Admitting: *Deleted

## 2023-07-15 NOTE — Telephone Encounter (Signed)
 Called pt to remind that him Anticoagulation Appt is tomorrow at the new location: [address] he verbalized understanding.

## 2023-07-16 ENCOUNTER — Ambulatory Visit: Attending: Cardiology | Admitting: *Deleted

## 2023-07-16 DIAGNOSIS — Z7901 Long term (current) use of anticoagulants: Secondary | ICD-10-CM

## 2023-07-16 DIAGNOSIS — I480 Paroxysmal atrial fibrillation: Secondary | ICD-10-CM | POA: Diagnosis not present

## 2023-07-16 LAB — POCT INR: INR: 2.9 (ref 2.0–3.0)

## 2023-07-16 NOTE — Patient Instructions (Signed)
 Description   Continue taking Warfarin 2 tablets daily except 2.5 tablets on Sunday, Tuesday, and Thursday. Have some leafy veggies today and remain consistent. Recheck INR in 6 weeks.  Stay consistent with greens each week.  Call if placed on any new medications [phone]

## 2023-07-23 DIAGNOSIS — H1132 Conjunctival hemorrhage, left eye: Secondary | ICD-10-CM | POA: Diagnosis not present

## 2023-07-23 DIAGNOSIS — H0015 Chalazion left lower eyelid: Secondary | ICD-10-CM | POA: Diagnosis not present

## 2023-08-05 DIAGNOSIS — E669 Obesity, unspecified: Secondary | ICD-10-CM | POA: Diagnosis not present

## 2023-08-05 DIAGNOSIS — M1A09X0 Idiopathic chronic gout, multiple sites, without tophus (tophi): Secondary | ICD-10-CM | POA: Diagnosis not present

## 2023-08-05 DIAGNOSIS — M1991 Primary osteoarthritis, unspecified site: Secondary | ICD-10-CM | POA: Diagnosis not present

## 2023-08-05 DIAGNOSIS — Z6830 Body mass index (BMI) 30.0-30.9, adult: Secondary | ICD-10-CM | POA: Diagnosis not present

## 2023-08-05 DIAGNOSIS — Z79899 Other long term (current) drug therapy: Secondary | ICD-10-CM | POA: Diagnosis not present

## 2023-08-06 DIAGNOSIS — G4733 Obstructive sleep apnea (adult) (pediatric): Secondary | ICD-10-CM | POA: Diagnosis not present

## 2023-08-06 DIAGNOSIS — I10 Essential (primary) hypertension: Secondary | ICD-10-CM | POA: Diagnosis not present

## 2023-08-06 DIAGNOSIS — I480 Paroxysmal atrial fibrillation: Secondary | ICD-10-CM | POA: Diagnosis not present

## 2023-08-06 DIAGNOSIS — J439 Emphysema, unspecified: Secondary | ICD-10-CM | POA: Diagnosis not present

## 2023-08-06 DIAGNOSIS — M109 Gout, unspecified: Secondary | ICD-10-CM | POA: Diagnosis not present

## 2023-08-06 DIAGNOSIS — N529 Male erectile dysfunction, unspecified: Secondary | ICD-10-CM | POA: Diagnosis not present

## 2023-08-06 DIAGNOSIS — E785 Hyperlipidemia, unspecified: Secondary | ICD-10-CM | POA: Diagnosis not present

## 2023-08-06 DIAGNOSIS — F17210 Nicotine dependence, cigarettes, uncomplicated: Secondary | ICD-10-CM | POA: Diagnosis not present

## 2023-08-06 DIAGNOSIS — D6869 Other thrombophilia: Secondary | ICD-10-CM | POA: Diagnosis not present

## 2023-08-06 DIAGNOSIS — R7303 Prediabetes: Secondary | ICD-10-CM | POA: Diagnosis not present

## 2023-08-06 DIAGNOSIS — C61 Malignant neoplasm of prostate: Secondary | ICD-10-CM | POA: Diagnosis not present

## 2023-08-25 ENCOUNTER — Other Ambulatory Visit: Payer: Self-pay | Admitting: Cardiology

## 2023-08-25 DIAGNOSIS — I480 Paroxysmal atrial fibrillation: Secondary | ICD-10-CM

## 2023-08-27 ENCOUNTER — Ambulatory Visit: Attending: Cardiology

## 2023-08-27 ENCOUNTER — Encounter

## 2023-08-27 DIAGNOSIS — I480 Paroxysmal atrial fibrillation: Secondary | ICD-10-CM

## 2023-08-27 DIAGNOSIS — Z7901 Long term (current) use of anticoagulants: Secondary | ICD-10-CM

## 2023-08-27 LAB — POCT INR: INR: 2.7 (ref 2.0–3.0)

## 2023-08-27 NOTE — Patient Instructions (Signed)
 Description   Continue taking Warfarin 2 tablets daily except 2.5 tablets on Sunday, Tuesday, and Thursday. Remain consistent with leafy veggies  Recheck INR in 6 weeks.  Stay consistent with greens each week.  Call if placed on any new medications [phone]

## 2023-09-03 DIAGNOSIS — H0015 Chalazion left lower eyelid: Secondary | ICD-10-CM | POA: Diagnosis not present

## 2023-09-03 DIAGNOSIS — H40013 Open angle with borderline findings, low risk, bilateral: Secondary | ICD-10-CM | POA: Diagnosis not present

## 2023-09-15 ENCOUNTER — Other Ambulatory Visit: Payer: Self-pay | Admitting: Cardiology

## 2023-09-16 ENCOUNTER — Encounter: Payer: Self-pay | Admitting: Cardiology

## 2023-09-17 NOTE — Progress Notes (Signed)
Please see anticoagulation encounter.

## 2023-09-22 DIAGNOSIS — G4733 Obstructive sleep apnea (adult) (pediatric): Secondary | ICD-10-CM | POA: Diagnosis not present

## 2023-10-08 ENCOUNTER — Ambulatory Visit: Attending: Cardiology

## 2023-10-08 DIAGNOSIS — Z7901 Long term (current) use of anticoagulants: Secondary | ICD-10-CM

## 2023-10-08 DIAGNOSIS — I480 Paroxysmal atrial fibrillation: Secondary | ICD-10-CM | POA: Diagnosis not present

## 2023-10-08 LAB — POCT INR: INR: 2.5 (ref 2.0–3.0)

## 2023-10-08 NOTE — Progress Notes (Signed)
 INR 2.5. Please see anticoagulation encounter

## 2023-10-08 NOTE — Patient Instructions (Signed)
 Description   Continue taking Warfarin 2 tablets daily except 2.5 tablets on Sunday, Tuesday, and Thursday. Remain consistent with leafy veggies  Recheck INR in 6 weeks.  Stay consistent with greens each week.  Call if placed on any new medications [phone]

## 2023-10-09 ENCOUNTER — Other Ambulatory Visit (HOSPITAL_COMMUNITY): Payer: Self-pay

## 2023-10-13 ENCOUNTER — Ambulatory Visit (HOSPITAL_COMMUNITY)
Admission: RE | Admit: 2023-10-13 | Discharge: 2023-10-13 | Disposition: A | Discharge status: Home / Self Care | Source: Ambulatory Visit | Attending: Internal Medicine | Admitting: Internal Medicine

## 2023-10-13 LAB — POCT HEMOGLOBIN-HEMACUE: Hemoglobin: 12.1 g/dL — ABNORMAL LOW (ref 13.0–17.0)

## 2023-11-13 DIAGNOSIS — J321 Chronic frontal sinusitis: Secondary | ICD-10-CM | POA: Diagnosis not present

## 2023-11-13 DIAGNOSIS — Z9889 Other specified postprocedural states: Secondary | ICD-10-CM | POA: Diagnosis not present

## 2023-11-18 DIAGNOSIS — Z9889 Other specified postprocedural states: Secondary | ICD-10-CM | POA: Diagnosis not present

## 2023-11-18 DIAGNOSIS — J321 Chronic frontal sinusitis: Secondary | ICD-10-CM | POA: Diagnosis not present

## 2023-11-19 ENCOUNTER — Ambulatory Visit: Attending: Cardiology

## 2023-11-19 DIAGNOSIS — I480 Paroxysmal atrial fibrillation: Secondary | ICD-10-CM

## 2023-11-19 DIAGNOSIS — Z7901 Long term (current) use of anticoagulants: Secondary | ICD-10-CM

## 2023-11-19 LAB — POCT INR: INR: 3 (ref 2.0–3.0)

## 2023-11-19 NOTE — Patient Instructions (Signed)
 Description   INR 3.0, Continue taking Warfarin 2 tablets daily except 2.5 tablets on Sundays, Tuesdays, and Thursdays. Remain consistent with leafy veggies  Recheck INR in 6 weeks.  Stay consistent with greens each week.  Call if placed on any new medications [phone]

## 2023-11-19 NOTE — Progress Notes (Signed)
 Description   INR 3.0, Continue taking Warfarin 2 tablets daily except 2.5 tablets on Sundays, Tuesdays, and Thursdays. Remain consistent with leafy veggies  Recheck INR in 6 weeks.  Stay consistent with greens each week.  Call if placed on any new medications [phone]

## 2023-11-24 DIAGNOSIS — M7989 Other specified soft tissue disorders: Secondary | ICD-10-CM | POA: Diagnosis not present

## 2023-11-24 DIAGNOSIS — D6869 Other thrombophilia: Secondary | ICD-10-CM | POA: Diagnosis not present

## 2023-11-24 DIAGNOSIS — I8310 Varicose veins of unspecified lower extremity with inflammation: Secondary | ICD-10-CM | POA: Diagnosis not present

## 2023-11-24 DIAGNOSIS — M25472 Effusion, left ankle: Secondary | ICD-10-CM | POA: Diagnosis not present

## 2023-11-24 DIAGNOSIS — M109 Gout, unspecified: Secondary | ICD-10-CM | POA: Diagnosis not present

## 2023-11-24 DIAGNOSIS — M1990 Unspecified osteoarthritis, unspecified site: Secondary | ICD-10-CM | POA: Diagnosis not present

## 2023-11-25 ENCOUNTER — Other Ambulatory Visit (HOSPITAL_COMMUNITY): Payer: Self-pay

## 2023-12-04 ENCOUNTER — Ambulatory Visit (INDEPENDENT_AMBULATORY_CARE_PROVIDER_SITE_OTHER): Admitting: Orthopedic Surgery

## 2023-12-04 DIAGNOSIS — I872 Venous insufficiency (chronic) (peripheral): Secondary | ICD-10-CM | POA: Diagnosis not present

## 2023-12-06 ENCOUNTER — Encounter: Payer: Self-pay | Admitting: Orthopedic Surgery

## 2023-12-06 NOTE — Progress Notes (Signed)
 Office Visit Note   Patient: Jonathan Vargas           Date of Birth: 11-20-1945           MRN: [id number] Visit Date: 12/04/2023              Requested by: Elliot Charm, MD [address] [place],  [place] [address] PCP: Elliot Charm, MD  Chief Complaint  Patient presents with   Left Ankle - Pain      HPI: Discussed the use of AI scribe software for clinical note transcription with the patient, who gave verbal consent to proceed.  History of Present Illness Jonathan Vargas is a 78 year old male with a history of gout who presents with left ankle pain and swelling.  He has experienced left ankle pain and swelling for several years, with a recent exacerbation three weeks ago after walking two miles. The pain, initially diffuse around the ankle, has now localized and become less sensitive. Swelling has been persistent and has worsened recently.  He describes frequent 'popping' in his back and hip when walking. He has been taking allopurinol for gout, and his uric acid levels have been maintained below normal. He has been using compression socks previously, but observed no reduction in swelling.     Assessment & Plan: Visit Diagnoses: No diagnosis found.  Plan: Assessment and Plan Assessment & Plan Chronic left ankle pain and swelling Chronic left ankle pain and swelling exacerbated by activity. Radiograph shows soft tissue changes, no bony abnormalities. Gout unlikely due to controlled uric acid levels. No DVT evidence. Swelling likely from venous insufficiency, indicated by increased calf circumference and brawny skin changes. Tenderness from skin stretching. - Recommend medical-grade compression socks (15-20 mmHg, extra large) from [facility]. - Advise leg elevation when sitting or lying down. - Schedule follow-up in four weeks to re-evaluate swelling and measure circumference.      Follow-Up Instructions:  No follow-ups on file.   Ortho Exam  Patient is alert, oriented, no adenopathy, well-dressed, normal affect, normal respiratory effort. Physical Exam EXTREMITIES: Increased venous swelling of the left leg compared to the right. Brawny skin color changes on the left leg. Left calf circumference 45 cm, right calf circumference 40 cm. Strong palpable dorsalis pedis pulse. Dorsiflexion of the left ankle does not reproduce pain. Palpation along the greater saphenous vein not painful. Generalized tenderness medially and laterally to the left ankle. Medial and lateral ankle ligaments stable.      Imaging: No results found. No images are attached to the encounter.  Labs: No results found for: HGBA1C, ESRSEDRATE, CRP, LABURIC, REPTSTATUS, GRAMSTAIN, CULT, LABORGA   Lab Results  Component Value Date   ALBUMIN 4.6 07/26/2019   ALBUMIN 3.8 01/16/2018    No results found for: MG No results found for: VD25OH  No results found for: PREALBUMIN    Latest Ref Rng & Units 10/13/2023    1:10 PM 03/24/2023    1:20 PM 09/17/2022    1:03 PM  CBC EXTENDED  Hemoglobin 13.0 - 17.0 g/dL 87.8  87.7  86.6      There is no height or weight on file to calculate BMI.  Orders:  No orders of the defined types were placed in this encounter.  No orders of the defined types were placed in this encounter.    Procedures: No procedures performed  Clinical Data: No additional findings.  ROS:  All other systems negative,  except as noted in the HPI. Review of Systems  Objective: Vital Signs: There were no vitals taken for this visit.  Specialty Comments:  No specialty comments available.  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   Aortic aneurysm (HCC) 04/16/2023   Pseudophakia of both eyes 12/13/2021   Preoperative cardiovascular examination 10/02/2021    Coronary artery calcification 10/02/2021   Aortic atherosclerosis (HCC) 09/27/2021   Lower back pain 05/24/2021   Premature atrial contractions 01/16/2021   Leg edema 01/16/2021   Posterior vitreous detachment of both eyes 12/07/2019   Bilateral degenerative progressive high myopia 12/07/2019   Early stage nonexudative age-related macular degeneration of both eyes 12/07/2019   Long term (current) use of anticoagulants 09/13/2019   It band syndrome, left 01/09/2018   Malignant neoplasm of prostate (HCC) 10/22/2017   Dupuytren's contracture of left hand 10/09/2017   Chronic pain of left knee 09/30/2017   Ear pain, left 05/17/2017   History of colonic polyps 09/26/2016   Family history of colon cancer 09/26/2016   Chronic frontal sinusitis 12/05/2015   Obesity 03/29/2015   Tobacco use 03/29/2015   Other disorders of iron metabolism 12/13/2012   Depressive disorder, not elsewhere classified 12/13/2012   Obstructive sleep apnea 12/13/2012   Esophageal reflux 12/13/2012   Impaired fasting glucose 12/13/2012   Tobacco use disorder 12/13/2012   Impotence of organic origin 12/13/2012   Gout, unspecified 12/13/2012   Long term current use of anticoagulant therapy 12/13/2012   Paroxysmal atrial fibrillation (HCC) 01/30/2011   Bradycardia 01/30/2011   Atrial flutter (HCC) 01/25/2011   Essential hypertension, benign 01/25/2011   Pure hypercholesterolemia 01/25/2011   Past Medical History:  Diagnosis Date   Acid reflux    Arthritis    ASD (atrial septal defect)    Asthma with allergic rhinitis and status asthmaticus    NO RECENT FLARE UPS   Atrial fib/flutter, transient (HCC)    Cancer (HCC)    skin   Chronic anticoagulation    Depression    Dysrhythmia    ATRIAL FIB   ENT complaint    Dr. Arlana   Erectile dysfunction    Fatty liver    FHx: allergies    Gout    , Possible, Right Knee x2 ; intermittent milder episodes of pain in hte first MTP - uric acid 7.7- no treatment so  far.   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   Pneumonia     walking PNA > 40 years ago   Prostate cancer (HCC) DX 2018   Seasonal allergies    Sleep apnea    wears CPAP ( set at 4-11)   Trigger finger, left    , fourth finger    Family History  Problem Relation Age of Onset   Stroke Father    Diabetes type II Father    Colon cancer Father    Heart disease Mother        with pacemaker   Breast cancer Sister    Diabetes type II Maternal Grandfather  Leukemia Paternal Uncle    Prostate cancer Other    Pancreatic cancer Neg Hx     Past Surgical History:  Procedure Laterality Date   CARDIOVERSION N/A 05/29/2016   Procedure: CARDIOVERSION;  Surgeon: Annabella Scarce, MD;  Location: [facility] ENDOSCOPY;  Service: Cardiovascular;  Laterality: N/A;   CATARACT EXTRACTION W/ INTRAOCULAR LENS  IMPLANT, BILATERAL  2016   COLONOSCOPY WITH PROPOFOL  N/A 09/26/2016   Procedure: COLONOSCOPY WITH PROPOFOL ;  Surgeon: Dianna Specking, MD;  Location: [facility] ENDOSCOPY;  Service: Endoscopy;  Laterality: N/A;   COLONOSCOPY WITH PROPOFOL  N/A 03/26/2022   Procedure: COLONOSCOPY WITH PROPOFOL ;  Surgeon: Saintclair Jasper, MD;  Location: [facility] ENDOSCOPY;  Service: Gastroenterology;  Laterality: N/A;  [id number]   CYSTOSCOPY  01/23/2018   Procedure: PHYLLIS SIDE;  Surgeon: Matilda Senior, MD;  Location: [facility];  Service: Urology;;  no seeds found in bladder   ESOPHAGOGASTRODUODENOSCOPY (EGD) WITH PROPOFOL  N/A 09/26/2016   Procedure: ESOPHAGOGASTRODUODENOSCOPY (EGD) WITH PROPOFOL ;  Surgeon: Dianna Specking, MD;  Location: [facility] ENDOSCOPY;  Service: Endoscopy;  Laterality: N/A;   LIVER BIOPSY  MANY YRS AGO   NASAL SINUS  SURGERY  1997   POLYPECTOMY  03/26/2022   Procedure: POLYPECTOMY;  Surgeon: Saintclair Jasper, MD;  Location: [facility];  Service: Gastroenterology;;   RADIOACTIVE SEED IMPLANT N/A 01/23/2018   Procedure: RADIOACTIVE SEED IMPLANT/BRACHYTHERAPY IMPLANT;  Surgeon: Matilda Senior, MD;  Location: [facility];  Service: Urology;  Laterality: N/A;   69 seeds implanted   SPACE OAR INSTILLATION N/A 01/23/2018   Procedure: SPACE OAR INSTILLATION;  Surgeon: Matilda Senior, MD;  Location: [facility];  Service: Urology;  Laterality: N/A;   Social History   Occupational History   Occupation: retired  Tobacco Use   Smoking status: Every Day    Current packs/day: 0.25    Average packs/day: 0.3 packs/day for 51.1 years (12.8 ttl pk-yrs)    Types: Cigarettes    Start date: 10/30/2013   Smokeless tobacco: Former    Types: Chew   Tobacco comments:    trying to quit 4-5 cigs per day. 05/21/23  Vaping Use   Vaping status: Former  Substance and Sexual Activity   Alcohol use: Yes    Alcohol/week: 0.0 standard drinks of alcohol    Comment: 2- glasses of red wine per night   Drug use: No   Sexual activity: Not Currently

## 2023-12-18 ENCOUNTER — Telehealth: Payer: Self-pay

## 2023-12-18 ENCOUNTER — Encounter: Payer: Self-pay | Admitting: Cardiology

## 2023-12-18 NOTE — Telephone Encounter (Signed)
 I spoke with patient and discussed Bactrim's interaction with Warfarin.  I rescheduled him for 10/9 to check INR.

## 2023-12-19 DIAGNOSIS — Z9889 Other specified postprocedural states: Secondary | ICD-10-CM | POA: Diagnosis not present

## 2023-12-19 DIAGNOSIS — J321 Chronic frontal sinusitis: Secondary | ICD-10-CM | POA: Diagnosis not present

## 2023-12-22 NOTE — Telephone Encounter (Signed)
 Patient called, had side effects to Bactrim. Discontinued on 12/19/23. Started doxycycline on 10/4. Will have patient resume previous dose and keep INR check on 10/9

## 2023-12-24 ENCOUNTER — Other Ambulatory Visit (HOSPITAL_COMMUNITY): Payer: Self-pay | Admitting: Internal Medicine

## 2023-12-24 ENCOUNTER — Ambulatory Visit (HOSPITAL_COMMUNITY)
Admission: RE | Admit: 2023-12-24 | Discharge: 2023-12-24 | Disposition: A | Discharge status: Home / Self Care | Source: Ambulatory Visit | Attending: Vascular Surgery | Admitting: Vascular Surgery

## 2023-12-24 DIAGNOSIS — M7989 Other specified soft tissue disorders: Secondary | ICD-10-CM

## 2023-12-25 ENCOUNTER — Ambulatory Visit: Attending: Cardiology

## 2023-12-25 DIAGNOSIS — I480 Paroxysmal atrial fibrillation: Secondary | ICD-10-CM | POA: Diagnosis not present

## 2023-12-25 DIAGNOSIS — Z7901 Long term (current) use of anticoagulants: Secondary | ICD-10-CM | POA: Diagnosis not present

## 2023-12-25 LAB — POCT INR: INR: 2.7 (ref 2.0–3.0)

## 2023-12-25 NOTE — Patient Instructions (Signed)
 Continue taking Warfarin 2 tablets daily except 2.5 tablets on Sundays, Tuesdays, and Thursdays. Remain consistent with leafy veggies  Recheck INR in 6 weeks.  Stay consistent with greens each week.  Call if placed on any new medications [phone]

## 2023-12-25 NOTE — Progress Notes (Signed)
 INR 2.7 Please see anticoagulation encounter Continue taking Warfarin 2 tablets daily except 2.5 tablets on Sundays, Tuesdays, and Thursdays. Remain consistent with leafy veggies  Recheck INR in 6 weeks.  Stay consistent with greens each week.  Call if placed on any new medications [phone]

## 2023-12-29 DIAGNOSIS — Z961 Presence of intraocular lens: Secondary | ICD-10-CM | POA: Diagnosis not present

## 2023-12-29 DIAGNOSIS — H353112 Nonexudative age-related macular degeneration, right eye, intermediate dry stage: Secondary | ICD-10-CM | POA: Diagnosis not present

## 2023-12-29 DIAGNOSIS — H4423 Degenerative myopia, bilateral: Secondary | ICD-10-CM | POA: Diagnosis not present

## 2023-12-29 DIAGNOSIS — H353123 Nonexudative age-related macular degeneration, left eye, advanced atrophic without subfoveal involvement: Secondary | ICD-10-CM | POA: Diagnosis not present

## 2023-12-29 DIAGNOSIS — H43813 Vitreous degeneration, bilateral: Secondary | ICD-10-CM | POA: Diagnosis not present

## 2023-12-30 ENCOUNTER — Telehealth: Payer: Self-pay

## 2023-12-30 DIAGNOSIS — R399 Unspecified symptoms and signs involving the genitourinary system: Secondary | ICD-10-CM | POA: Diagnosis not present

## 2023-12-30 DIAGNOSIS — J321 Chronic frontal sinusitis: Secondary | ICD-10-CM | POA: Diagnosis not present

## 2023-12-30 DIAGNOSIS — J0100 Acute maxillary sinusitis, unspecified: Secondary | ICD-10-CM | POA: Diagnosis not present

## 2023-12-30 DIAGNOSIS — C61 Malignant neoplasm of prostate: Secondary | ICD-10-CM | POA: Diagnosis not present

## 2023-12-30 NOTE — Telephone Encounter (Signed)
   Pre-operative Risk Assessment    Patient Name: Jonathan Vargas  DOB: [birth year] MRN: [id number]   Date of last office visit: 05/13/23  Glendia Ferrier Date of next office visit: 01/20/24   Request for Surgical Clearance    Procedure:  Rt Endoscopic Sinus Surgery, Possible Trephination  Date of Surgery:  Clearance TBD                               Surgeon:  Dr. Jesus Socks Group or Practice Name:  [facility] Phone number:  [phone] Fax number:  [phone]   Type of Clearance Requested:   - Medical  - Pharmacy:  Hold Warfarin (Coumadin )     Type of Anesthesia:  Not Indicated   Additional requests/questions:    Bonney Arlyne CROME Kallie   12/30/2023, 5:42 PM

## 2023-12-31 ENCOUNTER — Ambulatory Visit

## 2023-12-31 NOTE — Telephone Encounter (Signed)
   Name: Jonathan Vargas  DOB: 1945/08/09  MRN: [id number]  Primary Cardiologist: Oneil Parchment, MD  Chart reviewed as part of pre-operative protocol coverage. The patient has an upcoming visit scheduled with Glendia Ferrier ,PA  on 01/20/2024 at which time clearance can be addressed in case there are any issues that would impact surgical recommendations.  Rt Endoscopic Sinus Surgery, Possible Trephination Is not scheduled until TBD as below. I added preop FYI to appointment note so that provider is aware to address at time of outpatient visit.  Per office protocol the cardiology provider should forward their finalized clearance decision and recommendations regarding antiplatelet therapy to the requesting party below.    This message will also be routed to pharmacy pool  for input on holding coumadin  as requested below so that this information is available to the clearing provider at time of patient's appointment.   I will route this message as FYI to requesting party and remove this message from the preop box as separate preop APP input not needed at this time.   Please call with any questions.  Lamarr Satterfield, NP  12/31/2023, 7:42 AM

## 2023-12-31 NOTE — Telephone Encounter (Signed)
 Pharmacy please advise on holding coumadin  prior to Rt Endoscopic Sinus Surgery, Possible Trephination  scheduled for TBD. Has office visit with Glendia Ferrier on 01/20/2024. Next coumadin  clinic visit is scheduled for 02/05/2024. Thank you.

## 2024-01-01 ENCOUNTER — Ambulatory Visit: Admitting: Orthopedic Surgery

## 2024-01-01 ENCOUNTER — Other Ambulatory Visit: Payer: Self-pay

## 2024-01-01 DIAGNOSIS — Z01818 Encounter for other preprocedural examination: Secondary | ICD-10-CM

## 2024-01-01 NOTE — Telephone Encounter (Addendum)
 Patient with diagnosis of Afib on Warfarin for anticoagulation.    Procedure:  Rt Endoscopic Sinus Surgery, Possible Trephination   Date of procedure: TBD   CHA2DS2-VASc Score = 4   This indicates a 4.8% annual risk of stroke. The patient's score is based upon: CHF History: 0 HTN History: 1 Diabetes History: 0 Stroke History: 0 Vascular Disease History: 1 Age Score: 2 Gender Score: 0     CrCl 77 mL/min Platelet count - 200   Patient has not  had an Afib/aflutter ablation in the last 3 months, DCCV within the last 4 weeks or a watchman implanted in the last 45 days     Per office protocol, patient can hold warfarin for 5 days prior to procedure.   Patient will not need bridging with Lovenox (enoxaparin) around procedure.  **This guidance is not considered finalized until pre-operative APP has relayed final recommendations.**

## 2024-01-01 NOTE — Telephone Encounter (Signed)
 Called patient made him aware of the labs that are needed patient voiced understanding and will try to get those done as soon as possible.   Orders have been placed and released

## 2024-01-02 LAB — CBC
Hematocrit: 41.9 % (ref 37.5–51.0)
Hemoglobin: 13.8 g/dL (ref 13.0–17.7)
MCH: 34 pg — ABNORMAL HIGH (ref 26.6–33.0)
MCHC: 32.9 g/dL (ref 31.5–35.7)
MCV: 103 fL — ABNORMAL HIGH (ref 79–97)
Platelets: 200 x10E3/uL (ref 150–450)
RBC: 4.06 x10E6/uL — ABNORMAL LOW (ref 4.14–5.80)
RDW: 13.4 % (ref 11.6–15.4)
WBC: 5.4 x10E3/uL (ref 3.4–10.8)

## 2024-01-04 NOTE — Telephone Encounter (Signed)
  Platelet count 200   Per office protocol, patient can hold warfarin for 5 days prior to procedure.   Patient will not need bridging with Lovenox (enoxaparin) around procedure.  **This guidance is not considered finalized until pre-operative APP has relayed final recommendations.**

## 2024-01-05 ENCOUNTER — Ambulatory Visit: Admitting: Orthopedic Surgery

## 2024-01-05 DIAGNOSIS — I872 Venous insufficiency (chronic) (peripheral): Secondary | ICD-10-CM

## 2024-01-06 ENCOUNTER — Ambulatory Visit: Payer: Self-pay | Admitting: Cardiology

## 2024-01-06 ENCOUNTER — Encounter: Payer: Self-pay | Admitting: Orthopedic Surgery

## 2024-01-06 ENCOUNTER — Telehealth: Payer: Self-pay | Admitting: Cardiology

## 2024-01-06 NOTE — Telephone Encounter (Signed)
  Per MyChart scheduling message:  Patient is following up on lab results

## 2024-01-06 NOTE — Telephone Encounter (Signed)
 Pt contacting office for lab result. Looks like a CBC.

## 2024-01-06 NOTE — Progress Notes (Signed)
 Office Visit Note   Patient: Jonathan Vargas           Date of Birth: [birth year]           MRN: [id number] Visit Date: 01/05/2024              Requested by: Elliot Charm, MD [address] [place],  [place] [address] PCP: Elliot Charm, MD  Chief Complaint  Patient presents with   Left Ankle - Follow-up      HPI: Discussed the use of AI scribe software for clinical note transcription with the patient, who gave verbal consent to proceed.  History of Present Illness Jonathan Vargas is a 78 year old male who presents with swelling in the left leg.  He has experienced swelling in the left leg. He is using an extra large compression sock, which is appropriate for his leg size.  He uses a protein supplement after workouts with his trainer twice a week and occasionally as a meal replacement, mixing it with an apple or other ingredients to maintain protein intake.  He had a chiropractic appointment where tightness around the sciatic nerve was identified, attributed to the piriformis muscle. This intervention has alleviated most of his pain, and he feels about ninety percent better.     Assessment & Plan: Visit Diagnoses: No diagnosis found.  Plan: Assessment and Plan Assessment & Plan Left lower extremity edema Chronic edema with reduced swelling. Calf circumference decreased. No infection, redness, cellulitis, or ulcers. Compression sock appropriate for leg size. Exercise, elevation, and protein supplements effective. - Continue using extra large compression sock. - Continue exercise, leg elevation, and protein supplementation. - Follow up in two months.      Follow-Up Instructions: No follow-ups on file.   Ortho Exam  Patient is alert, oriented, no adenopathy, well-dressed, normal affect, normal respiratory effort. Physical Exam EXTREMITIES: Pitting edema in the left lower extremity. Calf circumference decreased from 45 to  43 cm with decreased swelling. No redness, cellulitis, or signs of infection in the lower extremity. No open ulcers on foot or leg.      Imaging: No results found. No images are attached to the encounter.  Labs: No results found for: HGBA1C, ESRSEDRATE, CRP, LABURIC, REPTSTATUS, GRAMSTAIN, CULT, LABORGA   Lab Results  Component Value Date   ALBUMIN 4.6 07/26/2019   ALBUMIN 3.8 01/16/2018    No results found for: MG No results found for: VD25OH  No results found for: PREALBUMIN    Latest Ref Rng & Units 01/02/2024   11:05 AM 10/13/2023    1:10 PM 03/24/2023    1:20 PM  CBC EXTENDED  WBC 3.4 - 10.8 x10E3/uL 5.4     RBC 4.14 - 5.80 x10E6/uL 4.06     Hemoglobin 13.0 - 17.7 g/dL 86.1  87.8  87.7   HCT 37.5 - 51.0 % 41.9     Platelets 150 - 450 x10E3/uL 200        There is no height or weight on file to calculate BMI.  Orders:  No orders of the defined types were placed in this encounter.  No orders of the defined types were placed in this encounter.    Procedures: No procedures performed  Clinical Data: No additional findings.  ROS:  All other systems negative, except as noted in the HPI. Review of Systems  Objective: Vital Signs: There were no vitals taken for this visit.  Specialty Comments:  No specialty comments  available.  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   Aortic aneurysm 04/16/2023   Pseudophakia of both eyes 12/13/2021   Preoperative cardiovascular examination 10/02/2021   Coronary artery calcification 10/02/2021   Aortic atherosclerosis 09/27/2021   Lower back pain 05/24/2021   Premature atrial contractions 01/16/2021   Leg edema 01/16/2021   Posterior vitreous detachment of both eyes 12/07/2019   Bilateral degenerative progressive high myopia 12/07/2019   Early stage nonexudative age-related  macular degeneration of both eyes 12/07/2019   Long term (current) use of anticoagulants 09/13/2019   It band syndrome, left 01/09/2018   Malignant neoplasm of prostate (HCC) 10/22/2017   Dupuytren's contracture of left hand 10/09/2017   Chronic pain of left knee 09/30/2017   Ear pain, left 05/17/2017   History of colonic polyps 09/26/2016   Family history of colon cancer 09/26/2016   Chronic frontal sinusitis 12/05/2015   Obesity 03/29/2015   Tobacco use 03/29/2015   Other disorders of iron metabolism 12/13/2012   Depressive disorder, not elsewhere classified 12/13/2012   Obstructive sleep apnea 12/13/2012   Esophageal reflux 12/13/2012   Impaired fasting glucose 12/13/2012   Tobacco use disorder 12/13/2012   Impotence of organic origin 12/13/2012   Gout, unspecified 12/13/2012   Long term current use of anticoagulant therapy 12/13/2012   Paroxysmal atrial fibrillation (HCC) 01/30/2011   Bradycardia 01/30/2011   Atrial flutter (HCC) 01/25/2011   Essential hypertension, benign 01/25/2011   Pure hypercholesterolemia 01/25/2011   Past Medical History:  Diagnosis Date   Acid reflux    Arthritis    ASD (atrial septal defect)    Asthma with allergic rhinitis and status asthmaticus    NO RECENT FLARE UPS   Atrial fib/flutter, transient (HCC)    Cancer (HCC)    skin   Chronic anticoagulation    Depression    Dysrhythmia    ATRIAL FIB   ENT complaint    Dr. Arlana   Erectile dysfunction    Fatty liver    FHx: allergies    Gout    , Possible, Right Knee x2 ; intermittent milder episodes of pain in hte first MTP - uric acid 7.7- no treatment so far.   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   Pneumonia      walking PNA > 40 years ago   Prostate cancer (HCC) DX 2018   Seasonal allergies    Sleep apnea    wears CPAP ( set at 4-11)   Trigger finger, left    , fourth finger    Family History  Problem Relation Age of Onset   Stroke Father    Diabetes type II Father    Colon cancer Father    Heart disease Mother        with pacemaker   Breast cancer Sister    Diabetes type II Maternal Grandfather    Leukemia Paternal Uncle    Prostate cancer Other    Pancreatic cancer Neg Hx     Past Surgical History:  Procedure Laterality Date   CARDIOVERSION  N/A 05/29/2016   Procedure: CARDIOVERSION;  Surgeon: Annabella Scarce, MD;  Location: [facility] ENDOSCOPY;  Service: Cardiovascular;  Laterality: N/A;   CATARACT EXTRACTION W/ INTRAOCULAR LENS  IMPLANT, BILATERAL  2016   COLONOSCOPY WITH PROPOFOL  N/A 09/26/2016   Procedure: COLONOSCOPY WITH PROPOFOL ;  Surgeon: Dianna Specking, MD;  Location: [facility] ENDOSCOPY;  Service: Endoscopy;  Laterality: N/A;   COLONOSCOPY WITH PROPOFOL  N/A 03/26/2022   Procedure: COLONOSCOPY WITH PROPOFOL ;  Surgeon: Saintclair Jasper, MD;  Location: [facility] ENDOSCOPY;  Service: Gastroenterology;  Laterality: N/A;  [address]   CYSTOSCOPY  01/23/2018   Procedure: PHYLLIS SIDE;  Surgeon: Matilda Senior, MD;  Location: [facility];  Service: Urology;;  no seeds found in bladder   ESOPHAGOGASTRODUODENOSCOPY (EGD) WITH PROPOFOL  N/A 09/26/2016   Procedure: ESOPHAGOGASTRODUODENOSCOPY (EGD) WITH PROPOFOL ;  Surgeon: Dianna Specking, MD;  Location: [facility] ENDOSCOPY;  Service: Endoscopy;  Laterality: N/A;   LIVER BIOPSY  MANY YRS AGO   NASAL SINUS SURGERY  1997   POLYPECTOMY  03/26/2022   Procedure: POLYPECTOMY;  Surgeon: Saintclair Jasper, MD;  Location: [facility];  Service: Gastroenterology;;   RADIOACTIVE SEED IMPLANT N/A 01/23/2018   Procedure: RADIOACTIVE SEED IMPLANT/BRACHYTHERAPY IMPLANT;  Surgeon: Matilda Senior, MD;  Location: [facility];  Service: Urology;   Laterality: N/A;   69 seeds implanted   SPACE OAR INSTILLATION N/A 01/23/2018   Procedure: SPACE OAR INSTILLATION;  Surgeon: Matilda Senior, MD;  Location: [facility];  Service: Urology;  Laterality: N/A;   Social History   Occupational History   Occupation: retired  Tobacco Use   Smoking status: Every Day    Current packs/day: 0.25    Average packs/day: 0.3 packs/day for 51.2 years (12.8 ttl pk-yrs)    Types: Cigarettes    Start date: 10/30/2013   Smokeless tobacco: Former    Types: Chew   Tobacco comments:    trying to quit 4-5 cigs per day. 05/21/23  Vaping Use   Vaping status: Former  Substance and Sexual Activity   Alcohol use: Yes    Alcohol/week: 0.0 standard drinks of alcohol    Comment: 2- glasses of red wine per night   Drug use: No   Sexual activity: Not Currently

## 2024-01-14 NOTE — H&P (Signed)
 HPI:   History of Present Illness [name] is a 78 year old male who presents for a follow-up visit regarding chronic sinusitis. He has been under the care of Dr. Jesus and was prescribed Bactrim for his condition. His last CT sinus scan showed an opacified right frontal sinus. He began taking Bactrim on 12/12/2023 but developed frequent urination, urinating six times at night for three days between 12/12/2023 and 12/15/2023. Jonathan Camps, PA, was consulted, and the patient was advised to discontinue Bactrim as it was not alleviating his sinus pain and congestion and was possibly causing increased urination.  The patient has been experiencing persistent sinus issues, which have not improved despite several consultations with Dr. Jesus and other physicians. He is considering surgical intervention due to the severity of his symptoms. Approximately a month ago, he began experiencing headaches, which he initially attributed to humidity. However, the pain intensified around the beginning of September 2025. A few days later, he noticed discolored discharge from his sinus. He has not observed any yellow discharge in the past couple of days. He has been using a saline rinse every other day and is unsure if he has used tobramycin irrigations in the past. He last saw Dr. Jesus about two months ago, during which they discussed his condition and performed a scan. He was prescribed Bactrim, which he took for 6.5 days, resulting in some relief. He took his last dose yesterday morning. He has previously taken clindamycin without any adverse effects and has also used doxycycline extensively, which he found effective depending on the severity of the infection.  PMH/Meds/All/SocHx/FamHx/ROS:   Medical History[1]  Surgical History[2]  No family history of bleeding disorders, wound healing problems or difficulty with anesthesia.   Social History   Socioeconomic History  Marital status: Divorced  Spouse name:  Not on file  Number of children: Not on file  Years of education: Not on file  Highest education level: Not on file  Occupational History  Not on file  Tobacco Use  Smoking status: Every Day  Current packs/day: 0.25  Types: Cigarettes  Smokeless tobacco: Current  Vaping Use  Vaping status: Never Used  Substance and Sexual Activity  Alcohol use: Yes  Drug use: No  Sexual activity: Yes  Partners: Female  Other Topics Concern  Not on file  Social History Narrative  Not on file   Social Drivers of Health   Food Insecurity: Not on file  Transportation Needs: Not on file  Safety: Not on file  Living Situation: Not on file   Current Medications[3]  A complete ROS was performed with pertinent positives/negatives noted in the HPI. The remainder of the ROS are negative.   Physical Exam:   There were no vitals taken for this visit.  General: Well developed, well nourished. No acute distress. Voice normal.  Head/Face: Normocephalic, atraumatic. No scars or lesions. No sinus tenderness. Facial nerve intact and equal bilaterally. No facial lacerations. TMJ nontender to palpation.  Eyes: Pupils are equal, round and reactive to light. Conjunctiva and lids are normal. Normal extraocular mobility.  Ears: Right: Pinna and external meatus normal, normal ear canal skin and caliber without excessive cerumen or drainage. Tympanic membrane intact without effusion or infection.  Left: Pinna and external meatus normal, normal ear canal skin and caliber without excessive cerumen or drainage. Tympanic membrane intact without effusion or infection.  Hearing: Normal speech reception to conversational tone.  Nose: No gross deformity or lesions. On anterior rhinoscopy no purulent discharge noted but purulent discharge  noted on nasopharyngeal endoscopy on right side. Septum midline, no turbinate hypertrophy with normal mucosa.  Mouth/Oropharynx: Lips normal, teeth and gums normal with good dentition,  normal oral vestibule. Normal floor of mouth, tongue and oral mucosa, no mucosal lesions, ulcer or mass, normal tongue mobility. Uvula midline. Hard and soft palate normal with normal mobility. +1 tonsils, no erythema or exudate.  Larynx: See TFL if applicable  Nasopharynx: See TFL if applicable  Neck: Trachea midline. No masses. No crepitus. Normal thyroid  gland palpation without thyromegaly or nodules palpated. Salivary glands normal to palpation without swelling, erythema or mass.  Lymphatic: No lymphadenopathy in the neck.  Respiratory: No stridor or distress.  Extremities: No edema or cyanosis. Warm and well-perfused.  Neurologic: CN II-XII intact. Alert and oriented to self, place and time. Normal reflexes and motor skills, balance and coordination. Moving all extremities without gross abnormality.   Independent Review of Additional Tests or Records:   Medical Records  Review and analysis of CT sinus scan 11/18/2023  IMPRESSION: 1. Improved aeration of bilateral maxillary sinuses with mild residual mucosal thickening and small retention cysts on the left. 2. Similar chronic opacification of the right frontal sinus with surrounding osteoneogenesis. Results Imaging - CT sinus scan: Opacified right frontal sinus November 18, 2023  Procedures:   Flexible Fiberoptic Endoscopy (CPT [phone]):  Indication for procedure: Flexible laryngoscopy was indicated for acute on chronic sinusitis After verbal consent was obtained, 4% lidocaine  and oxymetazoline  was sprayed into the patient's bilateral nasal cavities. A flexible fiberoptic laryngoscope was passed through the patient's right naris down to the level of the cords.  Nasal cavity: The septum is midline. Nasal cavity shows evidence of prior functional endoscopic sinus surgery. The left naris is clear. The right nares shows thick mucopurulent drainage. There are no masses, lesions, or polyps. There is no epistaxis. Nasopharynx: The eustachian  tube orifice and fossa of Rossenmuller are normal. There are no masses or lesions.  Oropharynx & Hypopharynx: The base of tongue, vallecula, pyriform sinuses, and post-cricoid area are normal. There are no masses or lesions. There is no pooling of secretions.  Larynx: The a-e folds, arytenoids, false cords are normal. The vocal cords were mobile bilaterally with abduction and good adduction. There are no masses or lesions. The patient has good sensation with a strong gag reflex.  Subglottis: Visualizing the subglottis in an awake patient during an outpatient exam is less reliable than for a sedated patient in the OR setting. However, the subglottis below the vocal cords and the trachea were visualized and appeared normal with no evidence of stenosis.  Right nares Right nares   Left nares oropharynx   Impression & Plans:  Jonathan Vargas is a 78 y.o. male with   1. Subacute maxillary sinusitis doxycycline (VIBRA-TABS) 100 mg tablet  CT Facial Bones WO Contrast   2. History of endoscopic sinus surgery    Assessment & Plan 1. Subacute maxillary sinusitis and chronic frontal sinusitis Persistent symptoms of sinus pain and congestion, with recent mucopurulent drainage observed on the right side. A re-scope procedure today confirmed the presence of yellow, thick mucopurulent discharge on the right side. Previously on Bactrim but discontinued due to increased urination and lack of symptom relief. Prescription for doxycycline for 10 days has been issued. Patient has an allergy to amoxicillin therefore avoiding Augmentin. Advised to intensify saline rinses to several times a day. A follow-up CT scan will be scheduled to assess the persistence of the infection upon completing current round of  antibiotics. Follow-up appointment with Dr. Jesus recommended in approximately one month.

## 2024-01-16 ENCOUNTER — Encounter: Payer: Self-pay | Admitting: *Deleted

## 2024-01-19 ENCOUNTER — Encounter: Payer: Self-pay | Admitting: Radiology

## 2024-01-19 NOTE — Assessment & Plan Note (Signed)
{  Click Here to Calculate RCRI      :789639253}  { Click Here to Calculate DASI      :789639253} {Select to add RCRI Risk (<1%=LOW; >/=1%=HIGH) (Optional):21036017}  {Select if HIGH (RCRI >/=1%) Risk (Optional):21036030} Recommendations: {2014 ACC/AHA Perioperative Guidelines  :21036001} Antiplatelet and/or Anticoagulation Recommendations: {Antiplatelet Recommendations                  :21036016} {Anticoagulation Recommendations           :78963980}

## 2024-01-19 NOTE — Progress Notes (Signed)
 OFFICE NOTE:    Date:  01/20/2024  ID:  Jonathan Vargas, DOB [birth year], MRN [id number] PCP: Jonathan Charm, MD  [facility] Providers Cardiologist:  Jonathan Parchment, MD Cardiology APP:  Jonathan Glendia DASEN, PA-C        Paroxysmal atrial fibrillation/flutter Admx 2012 w/ AFlutter, conv on Dilt w 8.2 post-term pause // S/p DCCV in 3/18 Warfarin anticoagulation; Flecainide  Rx  TTE 06/12/2017: Mild LVH, EF 55-60, normal wall motion, moderate LAE ZIO monitor 07/2019: NSR, no AFib, rare PAT/PVCs/PACs; brief idioventricular rhythm during sleep Myoview  01/07/2018: EF 55, normal perfusion; low risk Coronary artery Ca2+ (CT 08/2022) Hypertension  Hyperlipidemia (intol of statins) 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 History of Present Illness Jonathan Vargas is a 78 y.o. adult for surgical clearance. Last seen in 2/2/205. He needs R sinus surgery with Dr. Jesus. Our PharmD recommended holding Warfarin x 5 days.   He is scheduled for surgery November 14th due to chronic sinus issues persisting for 10 to 12 years. Recently, he developed a severe sinus infection causing significant pain, which led to further evaluation. He remains active, engaging in regular exercise at the gym, including using an elliptical and bike for up to an hour combined, and reports no chest discomfort, pressure, or shortness of breath during exercise. He experiences fatigue, particularly in the afternoons, which he attributes to poor sleep. He uses a CPAP machine for sleep apnea. He has venous insufficiency, managed with  compression hose, which has shown improvement. He is a smoker and wants to quit, considering the upcoming recovery period from surgery as an opportunity to do so.    ROS-See HPI    Studies Reviewed:  EKG Interpretation Date/Time:  Tuesday January 20 2024 10:05:48 EST Ventricular Rate:  52 PR Interval:  162 QRS Duration:  116 QT Interval:  428 QTC Calculation: 398 R Axis:   -32  Text Interpretation: Sinus bradycardia Left axis deviation Left ventricular hypertrophy with QRS widening TW inversions more evident in III, aVF - similar to old EKG from 03/08/20 Confirmed by Jonathan Vargas) on 01/20/2024 10:38:18 AM    Results LABS Total cholesterol: 129 (02/03/2023) HDL: 51 (02/03/2023) LDL: 57 (02/03/2023) Triglycerides: 116 (02/03/2023) Hemoglobin: 12.1 (10/13/2023) Hemoglobin: 13.8 (01/02/2024) Platelet count: 200 (01/02/2024) Creatinine: 1 mg/dL eGFR: 77 fO/fpw/8.26f Sodium: 132 mmol/L Potassium: 5 mmol/L ALT: 16 U/L   Risk Assessment/Calculations: CHA2DS2-VASc Score = 4   This indicates a 4.8% annual risk of stroke. The patient's score is based upon: CHF History: 0 HTN History: 1 Diabetes History: 0 Stroke History: 0 Vascular Disease History: 1 Age Score: 2 Gender Score: 0     HYPERTENSION CONTROL Vitals:   01/20/24 1002 01/20/24 1106  BP: (!) 144/80 (!) 150/84    The patient's blood pressure is elevated above target today.  In order to address the patient's elevated BP: Blood pressure will be monitored at home to determine if medication changes need to be made.         Physical Exam:  VS:  BP (!) 150/84   Pulse (!) 52  Ht 6' 1 (1.854 m)   Wt 223 lb 6.4 oz (101.3 kg)   SpO2 96%   BMI 29.47 kg/m        Wt Readings from Last 3 Encounters:  01/20/24 223 lb 6.4 oz (101.3 kg)  05/21/23 226 lb 6.4 oz (102.7 kg)  05/13/23 223 lb (101.2 kg)    Constitutional:      Appearance: Healthy appearance. Not in distress.  Neck:     Vascular: JVD  normal.  Pulmonary:     Breath sounds: Normal breath sounds. No wheezing. No rales.  Cardiovascular:     Normal rate. Regular rhythm.     Murmurs: There is no murmur.  Edema:    Peripheral edema present.    Ankle: bilateral 1+ edema of the ankle. Abdominal:     Palpations: Abdomen is soft.        Assessment and Plan:    Assessment & Plan Preoperative cardiovascular examination [name]'s perioperative risk of a major cardiac event is 0.4% according to the Revised Cardiac Risk Index (RCRI).  Therefore, he is at low risk for perioperative complications.   His functional capacity is good at 5.05 METs according to the [facility] Activity Status Index (DASI). Recommendations: According to ACC/AHA guidelines, no further cardiovascular testing needed.  The patient may proceed to surgery at acceptable risk.   Antiplatelet and/or Anticoagulation Recommendations: Coumadin  can by held for 5 days prior to surgery.  Please resume post op when felt to be safe. Paroxysmal atrial fibrillation (HCC) Paroxysmal atrial fibrillation managed with flecainide  and warfarin. Sinus rhythm maintained. Rate control medication discontinued years ago due to bradycardia. QRS stable on EKG. - Continue flecainide  50 mg twice daily. - Continue warfarin, managed in Coumadin  clinic. - Follow up 6 mos Coronary artery calcification No symptoms suggestive of angina. Active lifestyle with regular exercise without chest discomfort or dyspnea. - Continue Rosuvastatin 5 mg MWF Essential hypertension, benign Blood pressure currently suboptimally controlled. He has had other recent readings at goal. He experienced side effects from thiazide diuretics in the past. We could consider amlodipine, but it may worsen venous insufficiency/edema. - Monitor blood pressure at home for two weeks and send readings for review. - Continue Irbesartan  150 mg once daily  - Will consider increasing irbesartan  to 300 mg if blood pressure remains  high. - Could consider hydralazine if needed. Pure hypercholesterolemia LDL levels optimal as of November 2024. - Continue rosuvastatin 5 mg on Monday, Wednesday, and Friday. Tobacco use He is hoping he can quit after his surgery.       Dispo:  Return in about 6 months (around 07/19/2024) for Routine Follow Up, w/ Dr. Jeffrie.  Signed, Glendia Ferrier, PA-C

## 2024-01-20 ENCOUNTER — Encounter: Payer: Self-pay | Admitting: Physician Assistant

## 2024-01-20 ENCOUNTER — Ambulatory Visit: Attending: Physician Assistant | Admitting: Physician Assistant

## 2024-01-20 VITALS — BP 150/84 | HR 52 | Ht 73.0 in | Wt 223.4 lb

## 2024-01-20 DIAGNOSIS — Z720 Tobacco use: Secondary | ICD-10-CM | POA: Diagnosis not present

## 2024-01-20 DIAGNOSIS — E7800 Pure hypercholesterolemia, unspecified: Secondary | ICD-10-CM

## 2024-01-20 DIAGNOSIS — I10 Essential (primary) hypertension: Secondary | ICD-10-CM | POA: Diagnosis not present

## 2024-01-20 DIAGNOSIS — Z01810 Encounter for preprocedural cardiovascular examination: Secondary | ICD-10-CM | POA: Diagnosis not present

## 2024-01-20 DIAGNOSIS — I2510 Atherosclerotic heart disease of native coronary artery without angina pectoris: Secondary | ICD-10-CM | POA: Diagnosis not present

## 2024-01-20 DIAGNOSIS — I480 Paroxysmal atrial fibrillation: Secondary | ICD-10-CM

## 2024-01-20 NOTE — Telephone Encounter (Signed)
 Notes faxed to surgeon. Glendia Ferrier, PA-C    01/20/2024 5:19 PM

## 2024-01-20 NOTE — Assessment & Plan Note (Signed)
 He is hoping he can quit after his surgery.

## 2024-01-20 NOTE — Assessment & Plan Note (Signed)
 Blood pressure currently suboptimally controlled. He has had other recent readings at goal. He experienced side effects from thiazide diuretics in the past. We could consider amlodipine, but it may worsen venous insufficiency/edema. - Monitor blood pressure at home for two weeks and send readings for review. - Continue Irbesartan  150 mg once daily  - Will consider increasing irbesartan  to 300 mg if blood pressure remains high. - Could consider hydralazine if needed.

## 2024-01-20 NOTE — Assessment & Plan Note (Signed)
 No symptoms suggestive of angina. Active lifestyle with regular exercise without chest discomfort or dyspnea. - Continue Rosuvastatin 5 mg MWF

## 2024-01-20 NOTE — Patient Instructions (Addendum)
 Medication Instructions:  You should hold Coumadin  for 5 days prior to your surgery. Coumadin  should be resumed as soon as possible after your surgery. Dr. Jesus will need to let you know when it is safe to resume from a bleeding risk standpoint.  *If you need a refill on your cardiac medications before your next appointment, please call your pharmacy*   Follow-Up: At [facility], you and your health needs are our priority.  As part of our continuing mission to provide you with exceptional heart care, our providers are all part of one team.  This team includes your primary Cardiologist (physician) and Advanced Practice Providers or APPs (Physician Assistants and Nurse Practitioners) who all work together to provide you with the care you need, when you need it.  Your next appointment:   6 month(s)  Provider:   Oneil Parchment, MD    We recommend signing up for the patient portal called MyChart.  Sign up information is provided on this After Visit Summary.  MyChart is used to connect with patients for Virtual Visits (Telemedicine).  Patients are able to view lab/test results, encounter notes, upcoming appointments, etc.  Non-urgent messages can be sent to your provider as well.   To learn more about what you can do with MyChart, go to [email or web address].   Other Instructions  Your physician has requested that you regularly monitor and record your blood pressure readings at home for 2-3 weeks. Please use the same machine at the same time of day to check your readings and send them via MyChart message to [email or web address].

## 2024-01-20 NOTE — Assessment & Plan Note (Signed)
 Paroxysmal atrial fibrillation managed with flecainide  and warfarin. Sinus rhythm maintained. Rate control medication discontinued years ago due to bradycardia. QRS stable on EKG. - Continue flecainide  50 mg twice daily. - Continue warfarin, managed in Coumadin  clinic. - Follow up 6 mos

## 2024-01-20 NOTE — Assessment & Plan Note (Signed)
 LDL levels optimal as of November 2024. - Continue rosuvastatin 5 mg on Monday, Wednesday, and Friday.

## 2024-01-26 ENCOUNTER — Encounter (HOSPITAL_COMMUNITY): Payer: Self-pay

## 2024-01-26 NOTE — Progress Notes (Signed)
 Surgical Instructions   Your procedure is scheduled on Friday January 30, 2024. Report to [facility] Main Entrance A at 6:30 A.M., then check in with the Admitting office. Any questions or running late day of surgery: call [phone]  Questions prior to your surgery date: call [phone], Monday-Friday, 8am-4pm. If you experience any cold or flu symptoms such as cough, fever, chills, shortness of breath, etc. between now and your scheduled surgery, please notify us  at the above number.     Remember:  Do not eat or drink after midnight the night before your surgery  Take these medicines the morning of surgery with A SIP OF WATER  flecainide  (TAMBOCOR )  rosuvastatin (CRESTOR)   May take these medicines IF NEEDED: albuterol  (PROVENTIL  HFA;VENTOLIN  HFA) 108 (90 Base) MCG/ACT inhaler Please bring with you to the hospital COLCRYS  fexofenadine (ALLEGRA)   PER YOUR CARDIOLOGIST'S INSTRUCTIONS, PLEASE HOLD YOUR warfarin (COUMADIN )  FOR 5 DAYS PRIOR TO SURGERY WITH THE LAST DOSE BEING 01/24/2024  One week prior to surgery, STOP taking any Aspirin  (unless otherwise instructed by your surgeon) Aleve, Naproxen, Ibuprofen, Motrin, Advil, Goody's, BC's, all herbal medications, fish oil, and non-prescription vitamins. This includes your diclofenac sodium (VOLTAREN) 1 % GEL.                       Do NOT Smoke (Tobacco/Vaping) for 24 hours prior to your procedure.  If you use a CPAP at night, you may bring your mask/headgear for your overnight stay.   You will be asked to remove any contacts, glasses, piercing's, hearing aid's, dentures/partials prior to surgery. Please bring cases for these items if needed.    Patients discharged the day of surgery will not be allowed to drive home, and someone needs to stay with them for 24 hours.  SURGICAL WAITING ROOM VISITATION Patients may have no more than 2 support people in the waiting area - these visitors may rotate.   Pre-op nurse will  coordinate an appropriate time for 1 ADULT support person, who may not rotate, to accompany patient in pre-op.  Children under the age of 42 must have an adult with them who is not the patient and must remain in the main waiting area with an adult.  If the patient needs to stay at the hospital during part of their recovery, the visitor guidelines for inpatient rooms apply.  Please refer to the [facility] website for the visitor guidelines for any additional information.   If you received a COVID test during your pre-op visit  it is requested that you wear a mask when out in public, stay away from anyone that may not be feeling well and notify your surgeon if you develop symptoms. If you have been in contact with anyone that has tested positive in the last 10 days please notify you surgeon.      Pre-operative CHG Bathing Instructions   You can play a key role in reducing the risk of infection after surgery. Your skin needs to be as free of germs as possible. You can reduce the number of germs on your skin by washing with CHG (chlorhexidine gluconate) soap before surgery. CHG is an antiseptic soap that kills germs and continues to kill germs even after washing.   DO NOT use if you have an allergy to chlorhexidine/CHG or antibacterial soaps. If your skin becomes reddened or irritated, stop using the CHG and notify one of our RNs at [phone].  TAKE A SHOWER THE NIGHT BEFORE SURGERY   Please keep in mind the following:  DO NOT shave, including legs and underarms, 48 hours prior to surgery.   You may shave your face before/day of surgery.  Place clean sheets on your bed the night before surgery Use a clean washcloth (not used since being washed) for shower. DO NOT sleep with pet's night before surgery.  CHG Shower Instructions:  Wash your face and private area with normal soap. If you choose to wash your hair, wash first with your normal shampoo.  After you use shampoo/soap,  rinse your hair and body thoroughly to remove shampoo/soap residue.  Turn the water OFF and apply half the bottle of CHG soap to a CLEAN washcloth.  Apply CHG soap ONLY FROM YOUR NECK DOWN TO YOUR TOES (washing for 3-5 minutes)  DO NOT use CHG soap on face, private areas, open wounds, or sores.  Pay special attention to the area where your surgery is being performed.  If you are having back surgery, having someone wash your back for you may be helpful. Wait 2 minutes after CHG soap is applied, then you may rinse off the CHG soap.  Pat dry with a clean towel  Put on clean pajamas    Additional instructions for the day of surgery: If you choose, you may shower the morning of surgery with an antibacterial soap.  DO NOT APPLY any lotions, deodorants, cologne, or perfumes.   Do not wear jewelry or makeup Do not wear nail polish, gel polish, artificial nails, or any other type of covering on natural nails (fingers and toes) Do not bring valuables to the hospital. [facility] is not responsible for valuables/personal belongings. Put on clean/comfortable clothes.  Please brush your teeth.  Ask your nurse before applying any prescription medications to the skin.

## 2024-01-27 ENCOUNTER — Encounter (HOSPITAL_COMMUNITY): Payer: Self-pay

## 2024-01-27 ENCOUNTER — Other Ambulatory Visit: Payer: Self-pay

## 2024-01-27 ENCOUNTER — Inpatient Hospital Stay (HOSPITAL_COMMUNITY)
Admission: RE | Admit: 2024-01-27 | Discharge: 2024-01-27 | Disposition: A | Source: Ambulatory Visit | Attending: Otolaryngology

## 2024-01-27 VITALS — BP 137/80 | HR 67 | Temp 98.4°F | Resp 18 | Ht 73.0 in | Wt 224.0 lb

## 2024-01-27 DIAGNOSIS — I480 Paroxysmal atrial fibrillation: Secondary | ICD-10-CM | POA: Diagnosis not present

## 2024-01-27 DIAGNOSIS — K219 Gastro-esophageal reflux disease without esophagitis: Secondary | ICD-10-CM | POA: Insufficient documentation

## 2024-01-27 DIAGNOSIS — J4489 Other specified chronic obstructive pulmonary disease: Secondary | ICD-10-CM | POA: Insufficient documentation

## 2024-01-27 DIAGNOSIS — Z01818 Encounter for other preprocedural examination: Secondary | ICD-10-CM | POA: Diagnosis present

## 2024-01-27 DIAGNOSIS — E785 Hyperlipidemia, unspecified: Secondary | ICD-10-CM | POA: Insufficient documentation

## 2024-01-27 DIAGNOSIS — E871 Hypo-osmolality and hyponatremia: Secondary | ICD-10-CM | POA: Insufficient documentation

## 2024-01-27 DIAGNOSIS — Z01812 Encounter for preprocedural laboratory examination: Secondary | ICD-10-CM | POA: Diagnosis not present

## 2024-01-27 DIAGNOSIS — I872 Venous insufficiency (chronic) (peripheral): Secondary | ICD-10-CM | POA: Diagnosis not present

## 2024-01-27 DIAGNOSIS — G4733 Obstructive sleep apnea (adult) (pediatric): Secondary | ICD-10-CM | POA: Insufficient documentation

## 2024-01-27 DIAGNOSIS — I10 Essential (primary) hypertension: Secondary | ICD-10-CM | POA: Insufficient documentation

## 2024-01-27 DIAGNOSIS — Z7901 Long term (current) use of anticoagulants: Secondary | ICD-10-CM | POA: Insufficient documentation

## 2024-01-27 DIAGNOSIS — F17210 Nicotine dependence, cigarettes, uncomplicated: Secondary | ICD-10-CM | POA: Diagnosis not present

## 2024-01-27 HISTORY — DX: Thyrotoxicosis, unspecified without thyrotoxic crisis or storm: E05.90

## 2024-01-27 LAB — COMPREHENSIVE METABOLIC PANEL WITH GFR
ALT: 20 U/L (ref 0–44)
AST: 22 U/L (ref 15–41)
Albumin: 3.7 g/dL (ref 3.5–5.0)
Alkaline Phosphatase: 76 U/L (ref 38–126)
Anion gap: 9 (ref 5–15)
BUN: 28 mg/dL — ABNORMAL HIGH (ref 8–23)
CO2: 22 mmol/L (ref 22–32)
Calcium: 8.8 mg/dL — ABNORMAL LOW (ref 8.9–10.3)
Chloride: 102 mmol/L (ref 98–111)
Creatinine, Ser: 1.21 mg/dL (ref 0.61–1.24)
GFR, Estimated: >60 mL/min (ref >60)
Glucose, Bld: 99 mg/dL (ref 70–99)
Potassium: 4.5 mmol/L (ref 3.5–5.1)
Sodium: 133 mmol/L — ABNORMAL LOW (ref 135–145)
Total Bilirubin: 1 mg/dL (ref 0.0–1.2)
Total Protein: 6.6 g/dL (ref 6.5–8.1)

## 2024-01-27 LAB — CBC
HCT: 41.4 % (ref 39.0–52.0)
Hemoglobin: 13.9 g/dL (ref 13.0–17.0)
MCH: 33.6 pg (ref 26.0–34.0)
MCHC: 33.6 g/dL (ref 30.0–36.0)
MCV: 100 fL (ref 80.0–100.0)
Platelets: 185 K/uL (ref 150–400)
RBC: 4.14 MIL/uL — ABNORMAL LOW (ref 4.22–5.81)
RDW: 14.2 % (ref 11.5–15.5)
WBC: 8.3 K/uL (ref 4.0–10.5)
nRBC: 0 % (ref 0.0–0.2)

## 2024-01-27 NOTE — Progress Notes (Signed)
 PCP - Dr Valery Ripple Cardiologist - Dr Oneil Parchment (clearance 01/20/24)  Chest x-ray - 09/20/22 EKG - 01/20/24 Stress Test - 01/07/18 ECHO - 06/12/16 Cardiac Cath - n/a  ICD Pacemaker/Loop - n/a  Sleep Study -  Yes  CPAP - uses CPAP nightly  Diabetes - n/a  Blood Thinner Instructions:  Hold Coumadin  5 days prior to procedure per cardiologist.  Last dose was on 01/24/24.  Aspirin  Instructions: n/a  NPO  Anesthesia review: Yes  STOP now taking any Aspirin  (unless otherwise instructed by your surgeon), Aleve, Naproxen, Ibuprofen, Motrin, Advil, Goody's, BC's, all herbal medications, fish oil, and all vitamins.   Coronavirus Screening Do you have any of the following symptoms:  Cough yes/no: No Fever (>100.47F)  yes/no: No Runny nose yes/no: No Sore throat yes/no: No Difficulty breathing/shortness of breath  yes/no: No  Have you traveled in the last 14 days and where? yes/no: No  Patient verbalized understanding of instructions that were given to them at the PAT appointment. Patient was also instructed that they will need to review over the PAT instructions again at home before surgery.

## 2024-01-28 NOTE — Anesthesia Preprocedure Evaluation (Signed)
 Anesthesia Evaluation  Patient identified by MRN, date of birth, ID band Patient awake    Reviewed: Allergy & Precautions, NPO status , Patient's Chart, lab work & pertinent test results  Airway Mallampati: II  TM Distance: >3 FB Neck ROM: Full    Dental no notable dental hx.    Pulmonary asthma , sleep apnea , COPD,  COPD inhaler, Current Smoker and Patient abstained from smoking.   Pulmonary exam normal        Cardiovascular hypertension, Pt. on medications + Peripheral Vascular Disease  Normal cardiovascular exam+ dysrhythmias Atrial Fibrillation      Neuro/Psych  PSYCHIATRIC DISORDERS  Depression    negative neurological ROS     GI/Hepatic negative GI ROS,,,(+)     substance abuse    Endo/Other  negative endocrine ROS    Renal/GU negative Renal ROS     Musculoskeletal  (+) Arthritis ,    Abdominal   Peds  Hematology  (+) Blood dyscrasia (Coumadin )   Anesthesia Other Findings Chronic frontal sinusitis  Reproductive/Obstetrics                              Anesthesia Physical Anesthesia Plan  ASA: 3  Anesthesia Plan: General   Post-op Pain Management:    Induction: Intravenous  PONV Risk Score and Plan: 1 and Ondansetron , Dexamethasone  and Treatment may vary due to age or medical condition  Airway Management Planned: Oral ETT  Additional Equipment:   Intra-op Plan:   Post-operative Plan: Extubation in OR  Informed Consent: I have reviewed the patients History and Physical, chart, labs and discussed the procedure including the risks, benefits and alternatives for the proposed anesthesia with the patient or authorized representative who has indicated his/her understanding and acceptance.     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  Echo 05/2017 showed LVEF 55 to 60%, normal wall motion, no significant valvular abnormalities.  Myoview  12/2017 low risk, normal perfusion.  Last seen by Glendia Ferrier, PA-C on 01/20/2024 for preop evaluation.  Per note, Mr. Grosch's perioperative risk of a major cardiac event is 0.4% according to the Revised Cardiac Risk Index (RCRI).  Therefore, he is at low risk for perioperative complications.   His functional capacity is good at 5.05 METs according to the Duke Activity Status Index (DASI). Recommendations: According to ACC/AHA guidelines, no further cardiovascular testing needed.  The patient may proceed to surgery at acceptable risk. Antiplatelet and/or Anticoagulation Recommendations: Coumadin  can by held for 5 days prior to surgery.  Please resume post op when felt to be safe.  Patient reports last dose Coumadin  01/24/2024.  Other pertinent history includes current smoker, COPD/asthma, GERD, hemochromatosis.  Preop labs reviewed, mild hyponatremia sodium 133, otherwise unremarkable.  EKG 01/20/2024: Sinus bradycardia.  Rate 52. Left axis deviation. Left ventricular hypertrophy with QRS widening. TW inversions more evident in III, aVF - similar to old EKG from 03/08/20  Nuclear stress 01/07/2018:  Nuclear stress EF: 55%.  There was no ST segment deviation noted during stress.  The study is normal.  This is a low risk study.  The left ventricular ejection fraction is normal (55-65%).  TTE 06/12/2016: - Left ventricle: The cavity size was normal.  Wall thickness was  increased in a pattern of mild LVH. Systolic function was normal.  The estimated ejection fraction was in the range of 55% to 60%.  Wall motion was normal; there were no regional wall motion  abnormalities. Left ventricular diastolic  function parameters  were normal.  - Left atrium: The atrium was moderately dilated.  - Atrial septum: No defect or patent foramen ovale was identified.   )         Anesthesia Quick Evaluation

## 2024-01-28 NOTE — Progress Notes (Signed)
 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  Echo 05/2017 showed LVEF 55 to 60%, normal wall motion, no significant valvular abnormalities.  Myoview  12/2017 low risk, normal perfusion.  Last seen by Glendia Ferrier, PA-C on 01/20/2024 for preop evaluation.  Per note, Mr. Vavrek's perioperative risk of a major cardiac event is 0.4% according to the Revised Cardiac Risk Index (RCRI).  Therefore, he is at low risk for perioperative complications.   His functional capacity is good at 5.05 METs according to the Duke Activity Status Index (DASI). Recommendations: According to ACC/AHA guidelines, no further cardiovascular testing needed.  The patient may proceed to surgery at acceptable risk. Antiplatelet and/or Anticoagulation Recommendations: Coumadin  can by held for 5 days prior to surgery.  Please resume post op when felt to be safe.  Patient reports last dose Coumadin  01/24/2024.  Other pertinent history includes current smoker, COPD/asthma, GERD, hemochromatosis.  Preop labs reviewed, mild hyponatremia sodium 133, otherwise unremarkable.  EKG 01/20/2024: Sinus bradycardia.  Rate 52. Left axis deviation. Left ventricular hypertrophy with QRS widening. TW inversions more evident in III, aVF - similar to old EKG from 03/08/20  Nuclear stress 01/07/2018: Nuclear stress EF: 55%. There was no ST segment deviation noted during stress. The study is normal. This is a low risk study. The left ventricular ejection fraction is normal (55-65%).  TTE 06/12/2016: - Left ventricle: The cavity size was normal. Wall thickness was    increased in a pattern of mild LVH. Systolic function was normal.    The estimated ejection fraction was in the range of 55% to 60%.    Wall motion was  normal; there were no regional wall motion    abnormalities. Left ventricular diastolic function parameters    were normal.  - Left atrium: The atrium was moderately dilated.  - Atrial septum: No defect or patent foramen ovale was identified.     Lynwood Geofm RIGGERS [facility] Short Stay Center/Anesthesiology Phone [phone] 01/28/2024 2:55 PM

## 2024-01-29 NOTE — Progress Notes (Signed)
------------------------------------------  [facility] PROCEDURAL EXPEDITER NOTE-------------------------------------------------  Patient Name: Jonathan Vargas Patient DOB: [birth year] Today's Date: @TODAY @   Chart reviewed:  Yes  Documentation gaps: n/a Orders in place:  Yes  Communication with surgical team if no orders: n/a Labs, test, and orders reviewed: yes Requires surgical clearance:  Yes What type of clearance: cardiac clearance on chart on 01/20/2024, by Glendia Ferrier, PA. Clearance received: yes Patient status:pre op   Barriers noted:none  Intervention provided by [facility] team: n/a Barrier resolved:  Yes   Ronal Bald, RN [employer]

## 2024-01-30 ENCOUNTER — Encounter (HOSPITAL_COMMUNITY): Payer: Self-pay | Admitting: Physician Assistant

## 2024-01-30 ENCOUNTER — Encounter (HOSPITAL_COMMUNITY): Admission: RE | Disposition: A | Payer: Self-pay | Source: Home / Self Care | Attending: Otolaryngology

## 2024-01-30 ENCOUNTER — Ambulatory Visit (HOSPITAL_COMMUNITY)
Admission: RE | Admit: 2024-01-30 | Discharge: 2024-01-30 | Disposition: A | Discharge status: Home / Self Care | Attending: Otolaryngology | Admitting: Otolaryngology

## 2024-01-30 ENCOUNTER — Ambulatory Visit (HOSPITAL_COMMUNITY): Admitting: Physician Assistant

## 2024-01-30 ENCOUNTER — Encounter (HOSPITAL_COMMUNITY): Payer: Self-pay | Admitting: Otolaryngology

## 2024-01-30 ENCOUNTER — Other Ambulatory Visit: Payer: Self-pay

## 2024-01-30 DIAGNOSIS — F17210 Nicotine dependence, cigarettes, uncomplicated: Secondary | ICD-10-CM

## 2024-01-30 DIAGNOSIS — J321 Chronic frontal sinusitis: Secondary | ICD-10-CM | POA: Insufficient documentation

## 2024-01-30 DIAGNOSIS — G4730 Sleep apnea, unspecified: Secondary | ICD-10-CM | POA: Diagnosis not present

## 2024-01-30 DIAGNOSIS — I4891 Unspecified atrial fibrillation: Secondary | ICD-10-CM | POA: Insufficient documentation

## 2024-01-30 DIAGNOSIS — Z7901 Long term (current) use of anticoagulants: Secondary | ICD-10-CM | POA: Diagnosis not present

## 2024-01-30 DIAGNOSIS — M1990 Unspecified osteoarthritis, unspecified site: Secondary | ICD-10-CM | POA: Insufficient documentation

## 2024-01-30 DIAGNOSIS — D759 Disease of blood and blood-forming organs, unspecified: Secondary | ICD-10-CM | POA: Insufficient documentation

## 2024-01-30 DIAGNOSIS — F32A Depression, unspecified: Secondary | ICD-10-CM | POA: Diagnosis not present

## 2024-01-30 DIAGNOSIS — J449 Chronic obstructive pulmonary disease, unspecified: Secondary | ICD-10-CM | POA: Insufficient documentation

## 2024-01-30 DIAGNOSIS — I10 Essential (primary) hypertension: Secondary | ICD-10-CM | POA: Insufficient documentation

## 2024-01-30 DIAGNOSIS — I480 Paroxysmal atrial fibrillation: Secondary | ICD-10-CM | POA: Diagnosis not present

## 2024-01-30 DIAGNOSIS — I739 Peripheral vascular disease, unspecified: Secondary | ICD-10-CM | POA: Diagnosis not present

## 2024-01-30 HISTORY — PX: SINUS TREPHINING FRONTAL: SHX5216

## 2024-01-30 HISTORY — PX: FRONTAL SINUS EXPLORATION: SHX6591

## 2024-01-30 LAB — PROTIME-INR
INR: 1.1 (ref 0.8–1.2)
Prothrombin Time: 14.7 s (ref 11.4–15.2)

## 2024-01-30 SURGERY — EXPLORATION, FRONTAL SINUS
Anesthesia: General | Site: Nose | Laterality: Right

## 2024-01-30 MED ORDER — ACETAMINOPHEN 10 MG/ML IV SOLN
1000.0000 mg | Freq: Once | INTRAVENOUS | Status: DC | PRN
  Administered 2024-01-30: 1000 mg via INTRAVENOUS

## 2024-01-30 MED ORDER — ORAL CARE MOUTH RINSE: 15.0000 mL | Freq: Once | OROMUCOSAL | Status: AC

## 2024-01-30 MED ORDER — OXYMETAZOLINE HCL 0.05 % NA SOLN
NASAL | Status: DC | PRN
  Administered 2024-01-30: 1 via TOPICAL

## 2024-01-30 MED ORDER — DEXAMETHASONE SOD PHOSPHATE PF 10 MG/ML IJ SOLN
INTRAMUSCULAR | Status: DC | PRN
  Administered 2024-01-30: 10 mg via INTRAVENOUS

## 2024-01-30 MED ORDER — HEMOSTATIC AGENTS (NO CHARGE) OPTIME
TOPICAL | Status: DC | PRN
  Administered 2024-01-30: 1 via TOPICAL

## 2024-01-30 MED ORDER — FENTANYL CITRATE (PF) 250 MCG/5ML IJ SOLN
INTRAMUSCULAR | Status: DC | PRN
  Administered 2024-01-30: 150 ug via INTRAVENOUS
  Administered 2024-01-30: 50 ug via INTRAVENOUS

## 2024-01-30 MED ORDER — PROPOFOL 10 MG/ML IV BOLUS
INTRAVENOUS | Status: DC | PRN
  Administered 2024-01-30: 200 mg via INTRAVENOUS

## 2024-01-30 MED ORDER — CHLORHEXIDINE GLUCONATE 0.12 % MT SOLN
15.0000 mL | Freq: Once | OROMUCOSAL | Status: AC
  Administered 2024-01-30: 15 mL via OROMUCOSAL
  Filled 2024-01-30: qty 15

## 2024-01-30 MED ORDER — LIDOCAINE-EPINEPHRINE 1 %-1:100000 IJ SOLN
INTRAMUSCULAR | Status: AC
  Filled 2024-01-30: qty 1

## 2024-01-30 MED ORDER — AMISULPRIDE (ANTIEMETIC) 5 MG/2ML IV SOLN: 10.0000 mg | Freq: Once | INTRAVENOUS | Status: DC | PRN

## 2024-01-30 MED ORDER — ROCURONIUM BROMIDE 100 MG/10ML IV SOLN
INTRAVENOUS | Status: DC | PRN
  Administered 2024-01-30: 10 mg via INTRAVENOUS
  Administered 2024-01-30: 60 mg via INTRAVENOUS

## 2024-01-30 MED ORDER — SUGAMMADEX SODIUM 200 MG/2ML IV SOLN
INTRAVENOUS | Status: DC | PRN
  Administered 2024-01-30: 200 mg via INTRAVENOUS

## 2024-01-30 MED ORDER — LIDOCAINE HCL (CARDIAC) PF 100 MG/5ML IV SOSY
PREFILLED_SYRINGE | INTRAVENOUS | Status: DC | PRN
  Administered 2024-01-30: 60 mg via INTRATRACHEAL

## 2024-01-30 MED ORDER — LIDOCAINE-EPINEPHRINE 1 %-1:100000 IJ SOLN
INTRAMUSCULAR | Status: DC | PRN
  Administered 2024-01-30: 4 mL

## 2024-01-30 MED ORDER — FENTANYL CITRATE (PF) 250 MCG/5ML IJ SOLN
INTRAMUSCULAR | Status: AC
  Filled 2024-01-30: qty 5

## 2024-01-30 MED ORDER — OXYMETAZOLINE HCL 0.05 % NA SOLN
2.0000 | NASAL | Status: DC
  Filled 2024-01-30: qty 30

## 2024-01-30 MED ORDER — FENTANYL CITRATE (PF) 100 MCG/2ML IJ SOLN
INTRAMUSCULAR | Status: AC
  Filled 2024-01-30: qty 2

## 2024-01-30 MED ORDER — HYDROCODONE-ACETAMINOPHEN 5-325 MG PO TABS: 1.0000 | ORAL_TABLET | Freq: Four times a day (QID) | ORAL | 0 refills | Status: AC | PRN

## 2024-01-30 MED ORDER — ROCURONIUM BROMIDE 10 MG/ML (PF) SYRINGE
PREFILLED_SYRINGE | INTRAVENOUS | Status: AC
  Filled 2024-01-30: qty 10

## 2024-01-30 MED ORDER — OXYMETAZOLINE HCL 0.05 % NA SOLN
NASAL | Status: AC
  Filled 2024-01-30: qty 30

## 2024-01-30 MED ORDER — DEXMEDETOMIDINE HCL IN NACL 80 MCG/20ML IV SOLN
INTRAVENOUS | Status: DC | PRN
  Administered 2024-01-30: 8 ug via INTRAVENOUS

## 2024-01-30 MED ORDER — ONDANSETRON HCL 4 MG/2ML IJ SOLN
INTRAMUSCULAR | Status: DC | PRN
  Administered 2024-01-30: 4 mg via INTRAVENOUS

## 2024-01-30 MED ORDER — PROPOFOL 10 MG/ML IV BOLUS
INTRAVENOUS | Status: AC
  Filled 2024-01-30: qty 20

## 2024-01-30 MED ORDER — ONDANSETRON HCL 4 MG/2ML IJ SOLN
INTRAMUSCULAR | Status: AC
Start: 2024-01-30 — End: 2024-01-30
  Filled 2024-01-30: qty 2

## 2024-01-30 MED ORDER — BACITRACIN ZINC 500 UNIT/GM EX OINT
TOPICAL_OINTMENT | CUTANEOUS | Status: AC
  Filled 2024-01-30: qty 28.35

## 2024-01-30 MED ORDER — ACETAMINOPHEN 10 MG/ML IV SOLN
INTRAVENOUS | Status: AC
  Filled 2024-01-30: qty 100

## 2024-01-30 MED ORDER — FENTANYL CITRATE (PF) 100 MCG/2ML IJ SOLN
25.0000 ug | INTRAMUSCULAR | Status: DC | PRN
  Administered 2024-01-30: 50 ug via INTRAVENOUS
  Administered 2024-01-30: 25 ug via INTRAVENOUS

## 2024-01-30 MED ORDER — METOPROLOL TARTRATE 5 MG/5ML IV SOLN
INTRAVENOUS | Status: AC
  Filled 2024-01-30: qty 5

## 2024-01-30 MED ORDER — SODIUM CHLORIDE 0.9 % IR SOLN
Status: DC | PRN
  Administered 2024-01-30: 1000 mL

## 2024-01-30 MED ORDER — LACTATED RINGERS IV SOLN: INTRAVENOUS | Status: DC

## 2024-01-30 MED ORDER — 0.9 % SODIUM CHLORIDE (POUR BTL) OPTIME
TOPICAL | Status: DC | PRN
  Administered 2024-01-30: 1000 mL

## 2024-01-30 MED ORDER — ONDANSETRON HCL 4 MG/2ML IJ SOLN: 4.0000 mg | Freq: Once | INTRAMUSCULAR | Status: DC | PRN

## 2024-01-30 MED ORDER — ONDANSETRON 4 MG PO TBDP: 4.0000 mg | ORAL_TABLET | Freq: Three times a day (TID) | ORAL | 0 refills | Status: AC | PRN

## 2024-01-30 SURGICAL SUPPLY — 33 items
BAG COUNTER SPONGE SURGICOUNT (BAG) ×1 IMPLANT
BLADE RAD40 ROTATE 4M 4 5PK (BLADE) IMPLANT
BLADE RAD60 ROTATE M4 4 5PK (BLADE) IMPLANT
BLADE SURG 15 STRL LF DISP TIS (BLADE) IMPLANT
BLADE TRICUT ROTATE M4 4 5PK (BLADE) IMPLANT
BUR DIAMOND 13X5 70D (BURR) IMPLANT
CANISTER SUCTION 3000ML PPV (SUCTIONS) ×1 IMPLANT
CNTNR URN SCR LID CUP LEK RST (MISCELLANEOUS) IMPLANT
DRAPE HALF SHEET 40X57 (DRAPES) IMPLANT
DRESSING NASAL KENNEDY 3.5X.9 (MISCELLANEOUS) IMPLANT
DRSG NASOPORE 8CM (GAUZE/BANDAGES/DRESSINGS) IMPLANT
ELECTRODE REM PT RTRN 9FT ADLT (ELECTROSURGICAL) IMPLANT
GLOVE ECLIPSE 7.5 STRL STRAW (GLOVE) ×2 IMPLANT
GOWN STRL REUS W/ TWL LRG LVL3 (GOWN DISPOSABLE) ×2 IMPLANT
HEMOSTAT SURGICEL .5X2 ABSORB (HEMOSTASIS) IMPLANT
KIT BASIN OR (CUSTOM PROCEDURE TRAY) ×1 IMPLANT
KIT TURNOVER KIT B (KITS) ×1 IMPLANT
NDL PRECISIONGLIDE 27X1.5 (NEEDLE) ×1 IMPLANT
NEEDLE PRECISIONGLIDE 27X1.5 (NEEDLE) ×1 IMPLANT
PAD ARMBOARD POSITIONER FOAM (MISCELLANEOUS) ×2 IMPLANT
PAD MAGNETIC INSTR ST 16X20 (MISCELLANEOUS) IMPLANT
PATTIES SURGICAL .5 X3 (DISPOSABLE) ×1 IMPLANT
SHEATH ENDOSCRUB 0 DEG (SHEATH) IMPLANT
SHEATH ENDOSCRUB 30 DEG (SHEATH) IMPLANT
SOLN 0.9% NACL POUR BTL 1000ML (IV SOLUTION) ×1 IMPLANT
SOLN STERILE WATER BTL 1000 ML (IV SOLUTION) ×1 IMPLANT
SWAB COLLECTION DEVICE MRSA (MISCELLANEOUS) IMPLANT
SWAB CULTURE ESWAB REG 1ML (MISCELLANEOUS) IMPLANT
SYR 50ML SLIP (SYRINGE) IMPLANT
TOWEL GREEN STERILE FF (TOWEL DISPOSABLE) ×1 IMPLANT
TRAY ENT MC OR (CUSTOM PROCEDURE TRAY) ×1 IMPLANT
TUBE CONNECTING 20X1/4 (TUBING) IMPLANT
TUBING STRAIGHTSHOT EPS 5PK (TUBING) IMPLANT

## 2024-01-30 NOTE — Anesthesia Procedure Notes (Signed)
 Procedure Name: Intubation Date/Time: 01/30/2024 9:05 AM  Performed by: Brian Zeitlin J, CRNAPre-anesthesia Checklist: Patient identified, Emergency Drugs available, Suction available and Patient being monitored Patient Re-evaluated:Patient Re-evaluated prior to induction Oxygen Delivery Method: Circle System Utilized Preoxygenation: Pre-oxygenation with 100% oxygen Induction Type: IV induction Ventilation: Mask ventilation without difficulty Laryngoscope Size: Mac and 4 Grade View: Grade I Tube type: Oral Tube size: 7.5 mm Number of attempts: 1 Airway Equipment and Method: Stylet and Oral airway Placement Confirmation: ETT inserted through vocal cords under direct vision, positive ETCO2 and breath sounds checked- equal and bilateral Secured at: 23 cm Tube secured with: Tape Dental Injury: Teeth and Oropharynx as per pre-operative assessment

## 2024-01-30 NOTE — Discharge Instructions (Addendum)
 Use nasal saline spray every hour while awake.  Avoid bending over or any heavy lifting or physical activity for 2 weeks. Resume Coumadin  on Sunday

## 2024-01-30 NOTE — Op Note (Signed)
 OPERATIVE REPORT  DATE OF SURGERY: 01/30/2024  PATIENT:  Jonathan Vargas,  78 y.o. adult  PRE-OPERATIVE DIAGNOSIS:  Chronic frontal sinusitis Anticoagulated by anticoagulation treatment  POST-OPERATIVE DIAGNOSIS:  Chronic frontal sinusitis Anticoagulated by anticoagulation treatment  PROCEDURE:  Procedure(s): EXPLORATION, FRONTAL SINUS ENDOSCOPIC SINUSOTOMY, FRONTAL SINUS,, right  SURGEON:  Ida VEAR Loader, MD  ASSISTANTS: None  ANESTHESIA:   General   EBL: 75 ml  DRAINS: None  LOCAL MEDICATIONS USED: 1% Xylocaine  with epinephrine  SPECIMEN: Right frontal recess sinus contents  COUNTS:  Correct  PROCEDURE DETAILS: The patient was taken to the operating room and placed on the operating table in the supine position. Following induction of general endotracheal anesthesia, the right face was prepped and draped in standard fashion.  Afrin spray was used preoperatively in the nasal cavities.  30 degree nasal endoscope was used throughout the procedure.  The right nasal cavity was inspected.  The maxillary antrostomy was widely patent and the maxillary sinus was clear.  The ethmoid cavity was all clear except for the frontal recess where there was some polypoid inflammatory mucosa.  Local anesthetic was infiltrated into the superior and posterior attachment of the right middle turbinate and the frontal recess mucosa.  Giraffe forceps were used to removed inflammatory soft tissue from the frontal recess.  Initially I was unable to transilluminate the frontal sinus.  I was unable to pass a sinus seeker into the sinus.  The bony prominence just anterior to the anterior lip of the ethmoid was thickened.  Angled frontal sinus drill was used to open this bone up into the frontal sinus which was performed.  The frontal sinus itself was very narrow from anterior to posterior but was able to get a sinus seeker and a skinny suction into the area.  I was able to transilluminate externally and  visualize internally for transillumination.  A small strip of nasal pore dressing was packed into the frontal duct to prevent future closure.  Small piece of Surgicel was placed along the anterior ethmoid lip to control minor bleeding.  Another larger piece of NasoPore was used to pack the ethmoid cavity.  The pharynx was suctioned blood and secretions.  The patient was awakened extubated and transferred to recovery in stable condition.    PATIENT DISPOSITION:  To PACU, stable

## 2024-01-30 NOTE — Interval H&P Note (Signed)
 History and Physical Interval Note:  01/30/2024 7:22 AM  Jonathan Vargas  has presented today for surgery, with the diagnosis of Chronic frontal sinusitis Anticoagulated by anticoagulation treatment.  The various methods of treatment have been discussed with the patient and family. After consideration of risks, benefits and other options for treatment, the patient has consented to  Procedure(s): EXPLORATION, FRONTAL SINUS (Right) SINUSOTOMY, FRONTAL SINUS, USING TREPHINE-TYPE DEVICE (Right) as a surgical intervention.  The patient's history has been reviewed, patient examined, no change in status, stable for surgery.  I have reviewed the patient's chart and labs.  Questions were answered to the patient's satisfaction.     [name]

## 2024-01-30 NOTE — Transfer of Care (Signed)
 Immediate Anesthesia Transfer of Care Note  Patient: Erin Uecker Fobes  Procedure(s) Performed: EXPLORATION, FRONTAL SINUS (Right: Nose) ENDOSCOPIC SINUSOTOMY, FRONTAL SINUS, POSSIBLE TREPHINATION (Right: Nose)  Patient Location: PACU  Anesthesia Type:General  Level of Consciousness: awake, alert , and oriented  Airway & Oxygen Therapy: Patient Spontanous Breathing and Patient connected to face mask oxygen  Post-op Assessment: Report given to RN and Post -op Vital signs reviewed and stable  Post vital signs: Reviewed and stable  Last Vitals:  Vitals Value Taken Time  BP 150/94 01/30/24 10:23  Temp 36.6 C 01/30/24 10:23  Pulse 55 01/30/24 10:27  Resp 12 01/30/24 10:27  SpO2 96 % 01/30/24 10:27  Vitals shown include unfiled device data.  Last Pain:  Vitals:   01/30/24 1023  TempSrc:   PainSc: 0-No pain         Complications: No notable events documented.

## 2024-02-02 ENCOUNTER — Encounter (HOSPITAL_COMMUNITY): Payer: Self-pay | Admitting: Otolaryngology

## 2024-02-02 LAB — SURGICAL PATHOLOGY

## 2024-02-02 NOTE — Anesthesia Postprocedure Evaluation (Signed)
 Anesthesia Post Note  Patient: Kage Willmann Stamey  Procedure(s) Performed: EXPLORATION, FRONTAL SINUS (Right: Nose) ENDOSCOPIC SINUSOTOMY, FRONTAL SINUS, POSSIBLE TREPHINATION (Right: Nose)     Patient location during evaluation: PACU Anesthesia Type: General Level of consciousness: awake Pain management: pain level controlled Vital Signs Assessment: post-procedure vital signs reviewed and stable Respiratory status: spontaneous breathing, nonlabored ventilation and respiratory function stable Cardiovascular status: blood pressure returned to baseline and stable Postop Assessment: no apparent nausea or vomiting Anesthetic complications: no   No notable events documented.  Last Vitals:  Vitals:   01/30/24 1100 01/30/24 1115  BP: (!) 157/74 (!) 151/74  Pulse: (!) 51 (!) 46  Resp: 16 11  Temp:  36.6 C  SpO2: 94% 92%    Last Pain:  Vitals:   01/30/24 1115  TempSrc:   PainSc: 3                  Zerah Hilyer P Cliffton Spradley

## 2024-02-05 ENCOUNTER — Ambulatory Visit

## 2024-02-09 ENCOUNTER — Ambulatory Visit: Attending: Cardiology | Admitting: *Deleted

## 2024-02-09 DIAGNOSIS — Z7901 Long term (current) use of anticoagulants: Secondary | ICD-10-CM | POA: Diagnosis not present

## 2024-02-09 DIAGNOSIS — I480 Paroxysmal atrial fibrillation: Secondary | ICD-10-CM | POA: Diagnosis not present

## 2024-02-09 LAB — POCT INR: INR: 1.8 — AB (ref 2.0–3.0)

## 2024-02-09 NOTE — Progress Notes (Signed)
 Description   INR-1.8; Today take 2.5 tablets of warfarin then continue taking Warfarin 2 tablets daily except 2.5 tablets on Sundays, Tuesdays, and Thursdays. Remain consistent with leafy veggies  Recheck INR 2 weeks (normally 6 weeks).  Stay consistent with greens each week.  Call if placed on any new medications [phone]

## 2024-02-09 NOTE — Patient Instructions (Signed)
 Description   INR-1.8; Today take 2.5 tablets of warfarin then continue taking Warfarin 2 tablets daily except 2.5 tablets on Sundays, Tuesdays, and Thursdays. Remain consistent with leafy veggies  Recheck INR 2 weeks (normally 6 weeks).  Stay consistent with greens each week.  Call if placed on any new medications [phone]

## 2024-02-13 ENCOUNTER — Other Ambulatory Visit: Payer: Self-pay | Admitting: Cardiology

## 2024-02-13 DIAGNOSIS — I480 Paroxysmal atrial fibrillation: Secondary | ICD-10-CM

## 2024-02-16 ENCOUNTER — Other Ambulatory Visit: Payer: Self-pay

## 2024-02-16 MED ORDER — WARFARIN SODIUM 5 MG PO TABS: ORAL_TABLET | ORAL | 3 refills | Status: AC

## 2024-02-23 ENCOUNTER — Ambulatory Visit

## 2024-02-23 ENCOUNTER — Ambulatory Visit: Attending: Cardiology | Admitting: *Deleted

## 2024-02-23 DIAGNOSIS — I480 Paroxysmal atrial fibrillation: Secondary | ICD-10-CM | POA: Diagnosis not present

## 2024-02-23 DIAGNOSIS — Z7901 Long term (current) use of anticoagulants: Secondary | ICD-10-CM

## 2024-02-23 LAB — POCT INR: INR: 3 (ref 2.0–3.0)

## 2024-02-23 NOTE — Progress Notes (Signed)
 Description   INR-3.0; Tomorrow take 2 tablets of warfarin then continue taking Warfarin 2 tablets daily except 2.5 tablets on Sundays, Tuesdays, and Thursdays. Remain consistent with leafy veggies  Recheck INR 4 weeks (normally 6 weeks).  Stay consistent with greens each week.  Call if placed on any new medications [phone]

## 2024-02-23 NOTE — Patient Instructions (Signed)
 Description   INR-3.0; Tomorrow take 2 tablets of warfarin then continue taking Warfarin 2 tablets daily except 2.5 tablets on Sundays, Tuesdays, and Thursdays. Remain consistent with leafy veggies  Recheck INR 4 weeks (normally 6 weeks).  Stay consistent with greens each week.  Call if placed on any new medications [phone]

## 2024-02-25 DIAGNOSIS — I480 Paroxysmal atrial fibrillation: Secondary | ICD-10-CM | POA: Diagnosis not present

## 2024-02-25 DIAGNOSIS — Z0000 Encounter for general adult medical examination without abnormal findings: Secondary | ICD-10-CM | POA: Diagnosis not present

## 2024-02-25 DIAGNOSIS — M109 Gout, unspecified: Secondary | ICD-10-CM | POA: Diagnosis not present

## 2024-02-25 DIAGNOSIS — J4520 Mild intermittent asthma, uncomplicated: Secondary | ICD-10-CM | POA: Diagnosis not present

## 2024-02-25 DIAGNOSIS — F17210 Nicotine dependence, cigarettes, uncomplicated: Secondary | ICD-10-CM | POA: Diagnosis not present

## 2024-02-25 DIAGNOSIS — Z136 Encounter for screening for cardiovascular disorders: Secondary | ICD-10-CM | POA: Diagnosis not present

## 2024-02-25 DIAGNOSIS — D6869 Other thrombophilia: Secondary | ICD-10-CM | POA: Diagnosis not present

## 2024-02-25 DIAGNOSIS — Z1331 Encounter for screening for depression: Secondary | ICD-10-CM | POA: Diagnosis not present

## 2024-02-25 DIAGNOSIS — I10 Essential (primary) hypertension: Secondary | ICD-10-CM | POA: Diagnosis not present

## 2024-02-25 DIAGNOSIS — R609 Edema, unspecified: Secondary | ICD-10-CM | POA: Diagnosis not present

## 2024-02-25 DIAGNOSIS — Z7189 Other specified counseling: Secondary | ICD-10-CM | POA: Diagnosis not present

## 2024-02-25 DIAGNOSIS — F329 Major depressive disorder, single episode, unspecified: Secondary | ICD-10-CM | POA: Diagnosis not present

## 2024-02-25 DIAGNOSIS — R7303 Prediabetes: Secondary | ICD-10-CM | POA: Diagnosis not present

## 2024-03-01 ENCOUNTER — Ambulatory Visit: Admitting: Orthopedic Surgery

## 2024-03-01 DIAGNOSIS — I872 Venous insufficiency (chronic) (peripheral): Secondary | ICD-10-CM

## 2024-03-02 ENCOUNTER — Encounter: Payer: Self-pay | Admitting: Orthopedic Surgery

## 2024-03-02 NOTE — Progress Notes (Signed)
 Office Visit Note   Patient: Jonathan Vargas           Date of Birth: [birth year]           MRN: [id number] Visit Date: 03/01/2024              Requested by: Elliot Charm, MD [address] [place],  [place] [address] PCP: Elliot Charm, MD  Chief Complaint  Patient presents with   Left Leg - Follow-up      HPI: Discussed the use of AI scribe software for clinical note transcription with the patient, who gave verbal consent to proceed.  History of Present Illness Jonathan Vargas is a 78 year old male who presents with leg swelling.  He experiences persistent swelling in his left leg, which he attributes to varicose veins. He manages the swelling by elevating his leg when at home and wearing knee-high compression socks.  He has not experienced any pain associated with the swelling. He has not noticed any significant pain or other complications from his varicose veins. He has not developed any ulcers.  Patient has had decreased swelling after wearing his knee-high compression socks.  Assessment & Plan: Visit Diagnoses:  1. Venous insufficiency (chronic) (peripheral)     Plan: Assessment and Plan Assessment & Plan Varicose veins of the left lower extremity with chronic skin changes and edema Chronic varicose veins with skin changes and edema. No ulcers or cellulitis. Strong dorsalis pedis pulse. Risk of ulcers if swelling increases. - Continue knee-high compression socks. - Elevate leg at home. - Follow up if ulcers or pain develop.      Follow-Up Instructions: Return if symptoms worsen or fail to improve.   Ortho Exam  Patient is alert, oriented, no adenopathy, well-dressed, normal affect, normal respiratory effort. Physical Exam EXTREMITIES: Varicose veins in the left lower extremity with chronic skin color changes and thin varicose veins in the foot and neck. Pending edema in the leg and foot, no open ulcers, no  cellulitis. Strong palpable dorsalis pedis pulse.      Imaging: No results found. No images are attached to the encounter.  Labs: No results found for: HGBA1C, ESRSEDRATE, CRP, LABURIC, REPTSTATUS, GRAMSTAIN, CULT, LABORGA   Lab Results  Component Value Date   ALBUMIN 3.7 01/27/2024   ALBUMIN 4.6 07/26/2019   ALBUMIN 3.8 01/16/2018    No results found for: MG No results found for: VD25OH  No results found for: PREALBUMIN    Latest Ref Rng & Units 01/27/2024    2:00 PM 01/02/2024   11:05 AM 10/13/2023    1:10 PM  CBC EXTENDED  WBC 4.0 - 10.5 K/uL 8.3  5.4    RBC 4.22 - 5.81 MIL/uL 4.14  4.06    Hemoglobin 13.0 - 17.0 g/dL 86.0  86.1  87.8   HCT 39.0 - 52.0 % 41.4  41.9    Platelets 150 - 400 K/uL 185  200       There is no height or weight on file to calculate BMI.  Orders:  No orders of the defined types were placed in this encounter.  No orders of the defined types were placed in this encounter.    Procedures: No procedures performed  Clinical Data: No additional findings.  ROS:  All other systems negative, except as noted in the HPI. Review of Systems  Objective: Vital Signs: There were no vitals taken for this visit.  Specialty Comments:  No specialty  comments available.  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   Aortic aneurysm 04/16/2023   Pseudophakia of both eyes 12/13/2021   Preoperative cardiovascular examination 10/02/2021   Coronary artery calcification 10/02/2021   Aortic atherosclerosis 09/27/2021   Lower back pain 05/24/2021   Premature atrial contractions 01/16/2021   Leg edema 01/16/2021   Posterior vitreous detachment of both eyes 12/07/2019   Bilateral degenerative progressive high myopia 12/07/2019   Early stage nonexudative age-related macular degeneration of both eyes 12/07/2019    Long term (current) use of anticoagulants 09/13/2019   It band syndrome, left 01/09/2018   Malignant neoplasm of prostate (HCC) 10/22/2017   Dupuytren's contracture of left hand 10/09/2017   Chronic pain of left knee 09/30/2017   Ear pain, left 05/17/2017   History of colonic polyps 09/26/2016   Family history of colon cancer 09/26/2016   Chronic frontal sinusitis 12/05/2015   Obesity 03/29/2015   Tobacco use 03/29/2015   Other disorders of iron metabolism 12/13/2012   Depressive disorder, not elsewhere classified 12/13/2012   Obstructive sleep apnea 12/13/2012   Esophageal reflux 12/13/2012   Impaired fasting glucose 12/13/2012   Tobacco use disorder 12/13/2012   Impotence of organic origin 12/13/2012   Gout, unspecified 12/13/2012   Long term current use of anticoagulant therapy 12/13/2012   Paroxysmal atrial fibrillation (HCC) 01/30/2011   Bradycardia 01/30/2011   Atrial flutter (HCC) 01/25/2011   Essential hypertension, benign 01/25/2011   Pure hypercholesterolemia 01/25/2011   Past Medical History:  Diagnosis Date   Acid reflux    Arthritis    ASD (atrial septal defect)    Asthma with allergic rhinitis and status asthmaticus    rarely uses inhaler   Atrial fib/flutter, transient (HCC)    Cancer (HCC)    skin on ear and upper mid chest   Chronic anticoagulation    Depression    Dysrhythmia    ATRIAL FIB   ENT complaint    Dr. Arlana   Erectile dysfunction    Fatty liver    FHx: allergies    Gout    , Possible, Right Knee x2 ; intermittent milder episodes of pain in hte first MTP - uric acid 7.7- no treatment so far.   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    Pneumonia     walking PNA > 40 years ago   Prostate cancer (HCC) DX 2018   Seasonal allergies    Sleep apnea    wears CPAP ( set at 4-11) uses nightly   Trigger finger, left    , fourth finger    Family History  Problem Relation Age of Onset   Stroke Father    Diabetes type II Father    Colon cancer Father    Heart disease Mother        with pacemaker   Breast cancer Sister    Diabetes type II Maternal Grandfather    Leukemia Paternal Uncle    Prostate cancer Other    Pancreatic cancer Neg  Hx     Past Surgical History:  Procedure Laterality Date   CARDIOVERSION N/A 05/29/2016   Procedure: CARDIOVERSION;  Surgeon: Annabella Scarce, MD;  Location: [facility] ENDOSCOPY;  Service: Cardiovascular;  Laterality: N/A;   CATARACT EXTRACTION W/ INTRAOCULAR LENS  IMPLANT, BILATERAL  2016   COLONOSCOPY WITH PROPOFOL  N/A 09/26/2016   Procedure: COLONOSCOPY WITH PROPOFOL ;  Surgeon: Dianna Specking, MD;  [facility]COPY;  Service: Endoscopy;  Laterality: N/A;   COLONOSCOPY WITH PROPOFOL  N/A 03/26/2022   Procedure: COLONOSCOPY WITH PROPOFOL ;  Surgeon: Saintclair Jasper, MD;  Location: [facility] ENDOSCOPY;  Service: Gastroenterology;  Laterality: N/A;  [id number]   CYSTOSCOPY  01/23/2018   Procedure: PHYLLIS SIDE;  Surgeon: Matilda Senior, MD;  Location: [facility];  Service: Urology;;  no seeds found in bladder   ESOPHAGOGASTRODUODENOSCOPY (EGD) WITH PROPOFOL  N/A 09/26/2016   Procedure: ESOPHAGOGASTRODUODENOSCOPY (EGD) WITH PROPOFOL ;  Surgeon: Dianna Specking, MD;  Location: [facility] ENDOSCOPY;  Service: Endoscopy;  Laterality: N/A;   FRONTAL SINUS EXPLORATION Right 01/30/2024   Procedure: EXPLORATION, FRONTAL SINUS;  Surgeon: Jesus Oliphant, MD;  Location: [facility] OR;  Service: ENT;  Laterality: Right;   LIVER BIOPSY  MANY YRS AGO   NASAL SINUS SURGERY  1997   POLYPECTOMY  03/26/2022   Procedure: POLYPECTOMY;  Surgeon: Saintclair Jasper, MD;  Location: [facility];  Service: Gastroenterology;;    RADIOACTIVE SEED IMPLANT N/A 01/23/2018   Procedure: RADIOACTIVE SEED IMPLANT/BRACHYTHERAPY IMPLANT;  Surgeon: Matilda Senior, MD;  Location: [facility];  Service: Urology;  Laterality: N/A;   69 seeds implanted   SINUS TREPHINING FRONTAL Right 01/30/2024   Procedure: ENDOSCOPIC SINUSOTOMY, FRONTAL SINUS, POSSIBLE TREPHINATION;  Surgeon: Jesus Oliphant, MD;  Location: [facility] OR;  Service: ENT;  Laterality: Right;   SPACE OAR INSTILLATION N/A 01/23/2018   Procedure: SPACE OAR INSTILLATION;  Surgeon: Matilda Senior, MD;  [facility];  Service: Urology;  Laterality: N/A;   Social History   Occupational History   Occupation: retired  Tobacco Use   Smoking status: Every Day    Current packs/day: 0.25    Average packs/day: 0.3 packs/day for 51.3 years (12.8 ttl pk-yrs)    Types: Cigarettes    Start date: 10/30/2013   Smokeless tobacco: Former    Types: Chew   Tobacco comments:    trying to quit 4-5 cigs per day. 01/27/24  Vaping Use   Vaping status: Never Used  Substance and Sexual Activity   Alcohol use: Yes    Alcohol/week: 14.0 standard drinks of alcohol    Types: 14 Glasses of wine per week    Comment: 2- glasses of red wine per night   Drug use: No   Sexual activity: Not Currently    Birth control/protection: None

## 2024-03-11 ENCOUNTER — Other Ambulatory Visit: Payer: Self-pay | Admitting: Cardiology

## 2024-03-22 ENCOUNTER — Ambulatory Visit: Attending: Cardiology | Admitting: *Deleted

## 2024-03-22 DIAGNOSIS — I480 Paroxysmal atrial fibrillation: Secondary | ICD-10-CM | POA: Diagnosis not present

## 2024-03-22 DIAGNOSIS — R911 Solitary pulmonary nodule: Secondary | ICD-10-CM

## 2024-03-22 DIAGNOSIS — Z7901 Long term (current) use of anticoagulants: Secondary | ICD-10-CM | POA: Diagnosis not present

## 2024-03-22 LAB — POCT INR: INR: 2.3 (ref 2.0–3.0)

## 2024-03-22 NOTE — Patient Instructions (Signed)
 Description   INR-2.3; Continue taking Warfarin 2 tablets daily except 2.5 tablets on Sundays, Tuesdays, and Thursdays. Remain consistent with leafy veggies  Recheck INR 6 weeks (normally 6 weeks).  Stay consistent with greens each week.  Call if placed on any new medications [phone]

## 2024-03-22 NOTE — Telephone Encounter (Signed)
 Pt scheduled ov to review, nothing further needed.

## 2024-03-22 NOTE — Progress Notes (Signed)
 Description   INR-2.3; Continue taking Warfarin 2 tablets daily except 2.5 tablets on Sundays, Tuesdays, and Thursdays. Remain consistent with leafy veggies  Recheck INR 6 weeks (normally 6 weeks).  Stay consistent with greens each week.  Call if placed on any new medications [phone]

## 2024-03-22 NOTE — Telephone Encounter (Signed)
 Copied from CRM [id number]. Topic: Clinical - Request for Lab/Test Order >> Mar 22, 2024 11:04 AM Benton KIDD wrote: Reason for CRM: ms kenya from [facility] calling because received a referral for patient lung cancer screening and he does not qual;ify for this exam and he will need to just have a regular ct of the chest without contrast basically the same exam he had last year so this will need to be coreected and resent  [phone] for any questions  Select option 1 followed by option 4 to get to ct department   I have pended order for you to sign Please advise Dr. Shelah

## 2024-03-22 NOTE — Telephone Encounter (Signed)
 I agree, I signed the order

## 2024-04-15 ENCOUNTER — Ambulatory Visit (HOSPITAL_COMMUNITY)
Admission: RE | Admit: 2024-04-15 | Discharge: 2024-04-15 | Disposition: A | Discharge status: Home / Self Care | Source: Ambulatory Visit | Attending: Internal Medicine | Admitting: Internal Medicine

## 2024-04-15 ENCOUNTER — Encounter (HOSPITAL_COMMUNITY)

## 2024-04-15 NOTE — Progress Notes (Signed)
 Patient here for therapeutic phlebotomy. Hemocue/hemoglobin over 13. 500 ml removed as ordered from right AC. Pt tolerated well. VSS.

## 2024-04-16 LAB — POCT HEMOGLOBIN-HEMACUE: Hemoglobin: 13.5 g/dL (ref 13.0–17.0)

## 2024-04-22 ENCOUNTER — Ambulatory Visit
Admission: RE | Admit: 2024-04-22 | Discharge: 2024-04-22 | Disposition: A | Source: Ambulatory Visit | Attending: Emergency Medicine | Admitting: Emergency Medicine

## 2024-04-22 DIAGNOSIS — R911 Solitary pulmonary nodule: Secondary | ICD-10-CM

## 2024-05-03 ENCOUNTER — Ambulatory Visit

## 2024-05-13 ENCOUNTER — Ambulatory Visit: Admitting: Emergency Medicine

## 2024-10-14 ENCOUNTER — Ambulatory Visit (HOSPITAL_COMMUNITY)
